# Patient Record
Sex: Female | Born: 1945 | Race: Black or African American | Hispanic: No | Marital: Single | Smoking: Former smoker
Health system: Southern US, Community
[De-identification: ages and names within clinical notes are randomized; demographics above are authoritative.]

## PROBLEM LIST (undated history)

## (undated) DIAGNOSIS — E119 Type 2 diabetes mellitus without complications: Secondary | ICD-10-CM

## (undated) DIAGNOSIS — G629 Polyneuropathy, unspecified: Secondary | ICD-10-CM

## (undated) DIAGNOSIS — I472 Ventricular tachycardia, unspecified: Principal | ICD-10-CM

## (undated) DIAGNOSIS — I639 Cerebral infarction, unspecified: Secondary | ICD-10-CM

## (undated) DIAGNOSIS — K219 Gastro-esophageal reflux disease without esophagitis: Secondary | ICD-10-CM

## (undated) DIAGNOSIS — Z95818 Presence of other cardiac implants and grafts: Secondary | ICD-10-CM

## (undated) DIAGNOSIS — T7840XA Allergy, unspecified, initial encounter: Secondary | ICD-10-CM

## (undated) DIAGNOSIS — E785 Hyperlipidemia, unspecified: Secondary | ICD-10-CM

## (undated) DIAGNOSIS — I255 Ischemic cardiomyopathy: Secondary | ICD-10-CM

## (undated) DIAGNOSIS — I10 Essential (primary) hypertension: Secondary | ICD-10-CM

## (undated) DIAGNOSIS — K222 Esophageal obstruction: Secondary | ICD-10-CM

## (undated) DIAGNOSIS — I5022 Chronic systolic (congestive) heart failure: Secondary | ICD-10-CM

## (undated) DIAGNOSIS — K449 Diaphragmatic hernia without obstruction or gangrene: Secondary | ICD-10-CM

## (undated) DIAGNOSIS — K5790 Diverticulosis of intestine, part unspecified, without perforation or abscess without bleeding: Secondary | ICD-10-CM

## (undated) DIAGNOSIS — I2510 Atherosclerotic heart disease of native coronary artery without angina pectoris: Secondary | ICD-10-CM

## (undated) HISTORY — DX: Type 2 diabetes mellitus without complications: E11.9

## (undated) HISTORY — DX: Hyperlipidemia, unspecified: E78.5

## (undated) HISTORY — DX: Esophageal obstruction: K22.2

## (undated) HISTORY — DX: Diaphragmatic hernia without obstruction or gangrene: K44.9

## (undated) HISTORY — DX: Essential (primary) hypertension: I10

## (undated) HISTORY — DX: Diverticulosis of intestine, part unspecified, without perforation or abscess without bleeding: K57.90

## (undated) HISTORY — DX: Gastro-esophageal reflux disease without esophagitis: K21.9

## (undated) HISTORY — DX: Allergy, unspecified, initial encounter: T78.40XA

## (undated) HISTORY — DX: Polyneuropathy, unspecified: G62.9

---

## 2006-02-14 ENCOUNTER — Emergency Department (HOSPITAL_COMMUNITY): Admission: EM | Admit: 2006-02-14 | Discharge: 2006-02-14 | Payer: Self-pay | Admitting: Emergency Medicine

## 2007-08-17 ENCOUNTER — Ambulatory Visit: Payer: Self-pay | Admitting: Internal Medicine

## 2007-08-17 ENCOUNTER — Ambulatory Visit: Payer: Self-pay | Admitting: *Deleted

## 2007-08-28 ENCOUNTER — Ambulatory Visit: Payer: Self-pay | Admitting: Internal Medicine

## 2007-09-10 ENCOUNTER — Ambulatory Visit: Payer: Self-pay | Admitting: Internal Medicine

## 2007-09-10 LAB — CONVERTED CEMR LAB
Albumin: 4.4 g/dL (ref 3.5–5.2)
Alkaline Phosphatase: 66 units/L (ref 39–117)
CO2: 27 meq/L (ref 19–32)
Chloride: 101 meq/L (ref 96–112)
Eosinophils Absolute: 0.1 10*3/uL (ref 0.0–0.7)
Eosinophils Relative: 3 % (ref 0–5)
HCT: 38.6 % (ref 36.0–46.0)
Hemoglobin: 12.3 g/dL (ref 12.0–15.0)
LDL Cholesterol: 131 mg/dL — ABNORMAL HIGH (ref 0–99)
Lymphocytes Relative: 47 % — ABNORMAL HIGH (ref 12–46)
Lymphs Abs: 1.8 10*3/uL (ref 0.7–4.0)
MCV: 83.7 fL (ref 78.0–100.0)
Monocytes Relative: 11 % (ref 3–12)
Platelets: 330 10*3/uL (ref 150–400)
RBC: 4.61 M/uL (ref 3.87–5.11)
TSH: 1.58 microintl units/mL (ref 0.350–5.50)
Total CHOL/HDL Ratio: 5
Triglycerides: 135 mg/dL (ref <150)

## 2007-09-18 ENCOUNTER — Ambulatory Visit: Payer: Self-pay | Admitting: Internal Medicine

## 2007-11-11 ENCOUNTER — Ambulatory Visit: Payer: Self-pay | Admitting: Internal Medicine

## 2008-02-10 ENCOUNTER — Ambulatory Visit: Payer: Self-pay | Admitting: Family Medicine

## 2008-04-06 ENCOUNTER — Ambulatory Visit: Payer: Self-pay | Admitting: Internal Medicine

## 2008-04-06 ENCOUNTER — Encounter: Payer: Self-pay | Admitting: Family Medicine

## 2008-04-13 ENCOUNTER — Ambulatory Visit (HOSPITAL_COMMUNITY): Admission: RE | Admit: 2008-04-13 | Discharge: 2008-04-13 | Payer: Self-pay | Admitting: Family Medicine

## 2008-04-28 ENCOUNTER — Encounter: Payer: Self-pay | Admitting: Family Medicine

## 2008-04-28 ENCOUNTER — Ambulatory Visit: Payer: Self-pay | Admitting: Internal Medicine

## 2008-04-28 LAB — CONVERTED CEMR LAB
ALT: 31 units/L (ref 0–35)
AST: 26 units/L (ref 0–37)
Albumin: 4.6 g/dL (ref 3.5–5.2)
Alkaline Phosphatase: 58 units/L (ref 39–117)
Calcium: 9.7 mg/dL (ref 8.4–10.5)
Cholesterol: 149 mg/dL (ref 0–200)
Glucose, Bld: 137 mg/dL — ABNORMAL HIGH (ref 70–99)
HDL: 41 mg/dL (ref >39)
Potassium: 4.1 meq/L (ref 3.5–5.3)
Sodium: 141 meq/L (ref 135–145)
Total CHOL/HDL Ratio: 3.6
Total Protein: 7.8 g/dL (ref 6.0–8.3)
Triglycerides: 191 mg/dL — ABNORMAL HIGH (ref <150)

## 2009-06-15 ENCOUNTER — Ambulatory Visit: Payer: Self-pay | Admitting: Internal Medicine

## 2009-06-15 ENCOUNTER — Encounter: Payer: Self-pay | Admitting: Family Medicine

## 2009-06-15 LAB — CONVERTED CEMR LAB: Microalb, Ur: 1.35 mg/dL (ref 0.00–1.89)

## 2009-06-28 ENCOUNTER — Ambulatory Visit: Payer: Self-pay | Admitting: Internal Medicine

## 2009-06-28 ENCOUNTER — Encounter: Payer: Self-pay | Admitting: Family Medicine

## 2009-06-28 LAB — CONVERTED CEMR LAB
Albumin: 4.8 g/dL (ref 3.5–5.2)
Alkaline Phosphatase: 60 units/L (ref 39–117)
BUN: 14 mg/dL (ref 6–23)
Basophils Absolute: 0 10*3/uL (ref 0.0–0.1)
Creatinine, Ser: 0.68 mg/dL (ref 0.40–1.20)
Eosinophils Absolute: 0.1 10*3/uL (ref 0.0–0.7)
Glucose, Bld: 142 mg/dL — ABNORMAL HIGH (ref 70–99)
HCT: 39.6 % (ref 36.0–46.0)
HDL: 40 mg/dL (ref >39)
Hemoglobin: 12.6 g/dL (ref 12.0–15.0)
LDL Cholesterol: 72 mg/dL (ref 0–99)
Lymphocytes Relative: 39 % (ref 12–46)
MCHC: 31.8 g/dL (ref 30.0–36.0)
MCV: 83.5 fL (ref 78.0–100.0)
Monocytes Absolute: 0.5 10*3/uL (ref 0.1–1.0)
Neutrophils Relative %: 49 % (ref 43–77)
Sed Rate: 11 mm/hr (ref 0–22)
Total CHOL/HDL Ratio: 3.5
Total Protein: 7.7 g/dL (ref 6.0–8.3)
WBC: 5 10*3/uL (ref 4.0–10.5)

## 2009-07-03 ENCOUNTER — Ambulatory Visit: Payer: Self-pay | Admitting: Internal Medicine

## 2009-10-13 ENCOUNTER — Ambulatory Visit: Payer: Self-pay | Admitting: Internal Medicine

## 2009-10-23 ENCOUNTER — Ambulatory Visit: Payer: Self-pay | Admitting: Internal Medicine

## 2009-10-23 LAB — CONVERTED CEMR LAB
BUN: 13 mg/dL (ref 6–23)
Chloride: 102 meq/L (ref 96–112)
Cholesterol: 155 mg/dL (ref 0–200)
Glucose, Bld: 165 mg/dL — ABNORMAL HIGH (ref 70–99)
Hgb A1c MFr Bld: 8.4 % — ABNORMAL HIGH (ref 4.6–6.1)
LDL Cholesterol: 86 mg/dL (ref 0–99)
Potassium: 4 meq/L (ref 3.5–5.3)
Sodium: 141 meq/L (ref 135–145)
Total CHOL/HDL Ratio: 3.8
VLDL: 28 mg/dL (ref 0–40)

## 2009-11-03 ENCOUNTER — Ambulatory Visit: Payer: Self-pay | Admitting: Internal Medicine

## 2010-01-23 ENCOUNTER — Ambulatory Visit: Payer: Self-pay | Admitting: Internal Medicine

## 2010-01-23 LAB — CONVERTED CEMR LAB
BUN: 14 mg/dL (ref 6–23)
Calcium: 9.7 mg/dL (ref 8.4–10.5)
Chloride: 99 meq/L (ref 96–112)
Creatinine, Ser: 0.65 mg/dL (ref 0.40–1.20)
Glucose, Bld: 171 mg/dL — ABNORMAL HIGH (ref 70–99)
HDL: 39 mg/dL — ABNORMAL LOW (ref >39)
Potassium: 4.6 meq/L (ref 3.5–5.3)
Sodium: 140 meq/L (ref 135–145)
Triglycerides: 171 mg/dL — ABNORMAL HIGH (ref <150)
VLDL: 34 mg/dL (ref 0–40)

## 2010-02-02 ENCOUNTER — Ambulatory Visit: Payer: Self-pay | Admitting: Internal Medicine

## 2010-02-02 LAB — CONVERTED CEMR LAB
CO2: 22 meq/L (ref 19–32)
Calcium: 9.4 mg/dL (ref 8.4–10.5)
Chloride: 101 meq/L (ref 96–112)
Creatinine, Ser: 0.63 mg/dL (ref 0.40–1.20)
Potassium: 4.2 meq/L (ref 3.5–5.3)
Sodium: 140 meq/L (ref 135–145)

## 2010-02-13 ENCOUNTER — Ambulatory Visit: Payer: Self-pay | Admitting: Internal Medicine

## 2010-02-21 ENCOUNTER — Ambulatory Visit: Payer: Self-pay | Admitting: Internal Medicine

## 2010-03-01 ENCOUNTER — Ambulatory Visit (HOSPITAL_COMMUNITY): Admission: RE | Admit: 2010-03-01 | Discharge: 2010-03-01 | Payer: Self-pay | Admitting: Family Medicine

## 2010-03-08 ENCOUNTER — Ambulatory Visit: Payer: Self-pay | Admitting: Internal Medicine

## 2010-03-15 ENCOUNTER — Ambulatory Visit: Payer: Self-pay | Admitting: Internal Medicine

## 2010-05-10 ENCOUNTER — Encounter (INDEPENDENT_AMBULATORY_CARE_PROVIDER_SITE_OTHER): Payer: Self-pay | Admitting: Internal Medicine

## 2010-05-10 LAB — CONVERTED CEMR LAB
BUN: 14 mg/dL (ref 6–23)
Chloride: 103 meq/L (ref 96–112)
Potassium: 4.5 meq/L (ref 3.5–5.3)

## 2010-06-18 ENCOUNTER — Encounter (INDEPENDENT_AMBULATORY_CARE_PROVIDER_SITE_OTHER): Payer: Self-pay | Admitting: *Deleted

## 2010-06-18 LAB — CONVERTED CEMR LAB
Cholesterol: 179 mg/dL (ref 0–200)
VLDL: 40 mg/dL (ref 0–40)

## 2010-06-23 ENCOUNTER — Emergency Department (HOSPITAL_COMMUNITY): Admission: EM | Admit: 2010-06-23 | Discharge: 2010-06-23 | Payer: Self-pay | Admitting: Emergency Medicine

## 2011-03-25 ENCOUNTER — Other Ambulatory Visit (HOSPITAL_COMMUNITY): Payer: Self-pay | Admitting: Family Medicine

## 2011-03-25 DIAGNOSIS — Z1231 Encounter for screening mammogram for malignant neoplasm of breast: Secondary | ICD-10-CM

## 2011-04-04 ENCOUNTER — Ambulatory Visit (HOSPITAL_COMMUNITY)
Admission: RE | Admit: 2011-04-04 | Discharge: 2011-04-04 | Disposition: A | Discharge status: Home / Self Care | Payer: Medicare Other | Source: Ambulatory Visit | Attending: Family Medicine | Admitting: Family Medicine

## 2011-04-04 DIAGNOSIS — Z1231 Encounter for screening mammogram for malignant neoplasm of breast: Secondary | ICD-10-CM

## 2011-08-20 HISTORY — PX: TOOTH EXTRACTION: SUR596

## 2012-05-06 ENCOUNTER — Other Ambulatory Visit (HOSPITAL_COMMUNITY): Payer: Self-pay | Admitting: Internal Medicine

## 2012-05-06 DIAGNOSIS — Z1231 Encounter for screening mammogram for malignant neoplasm of breast: Secondary | ICD-10-CM

## 2012-05-21 ENCOUNTER — Ambulatory Visit (HOSPITAL_COMMUNITY)
Admission: RE | Admit: 2012-05-21 | Discharge: 2012-05-21 | Disposition: A | Discharge status: Home / Self Care | Payer: Medicare Other | Source: Ambulatory Visit | Attending: Internal Medicine | Admitting: Internal Medicine

## 2012-05-21 DIAGNOSIS — Z1231 Encounter for screening mammogram for malignant neoplasm of breast: Secondary | ICD-10-CM | POA: Insufficient documentation

## 2013-06-08 ENCOUNTER — Other Ambulatory Visit (HOSPITAL_COMMUNITY): Payer: Self-pay | Admitting: Internal Medicine

## 2013-06-08 DIAGNOSIS — Z1231 Encounter for screening mammogram for malignant neoplasm of breast: Secondary | ICD-10-CM

## 2013-06-23 ENCOUNTER — Ambulatory Visit (HOSPITAL_COMMUNITY)
Admission: RE | Admit: 2013-06-23 | Discharge: 2013-06-23 | Disposition: A | Discharge status: Home / Self Care | Payer: Medicare Other | Source: Ambulatory Visit | Attending: Internal Medicine | Admitting: Internal Medicine

## 2013-06-23 DIAGNOSIS — Z1231 Encounter for screening mammogram for malignant neoplasm of breast: Secondary | ICD-10-CM | POA: Insufficient documentation

## 2013-12-28 ENCOUNTER — Encounter: Payer: Self-pay | Admitting: Internal Medicine

## 2014-02-15 ENCOUNTER — Encounter: Payer: Self-pay | Admitting: Internal Medicine

## 2014-03-01 ENCOUNTER — Encounter: Payer: Medicare Other | Admitting: Internal Medicine

## 2014-04-22 ENCOUNTER — Ambulatory Visit (AMBULATORY_SURGERY_CENTER): Payer: Self-pay | Admitting: *Deleted

## 2014-04-22 ENCOUNTER — Telehealth: Payer: Self-pay | Admitting: *Deleted

## 2014-04-22 VITALS — Ht 65.0 in | Wt 186.6 lb

## 2014-04-22 DIAGNOSIS — Z1211 Encounter for screening for malignant neoplasm of colon: Secondary | ICD-10-CM

## 2014-04-22 MED ORDER — MOVIPREP 100 G PO SOLR: ORAL | Status: DC

## 2014-04-22 NOTE — Progress Notes (Signed)
No allergies to eggs or soy. No problems with anesthesia.  Pt given Emmi instructions for colonoscopy  No oxygen use  No diet drug use  

## 2014-04-22 NOTE — Telephone Encounter (Signed)
Dr Hilarie Fredrickson: pt is scheduled for screening colonoscopy 05/06/14.  During PV pt says Dr Jeanie Cooks wants her to be evaluated for reflux symptoms.  Pt has been having feeling of fullness in esophagus and belching after eating.  She says symptoms are worse when she lays down to sleep at night.  No dysphagia.  She has tried Zegerid but it was not woking.  Is taking Alka Seltzer antacid and this works sometimes.  She would like to keep colonoscopy appt. as scheduled and make OV to evaluate reflux symptoms.  Is this okay with you?  Thanks, Juliann Pulse

## 2014-04-26 NOTE — Telephone Encounter (Signed)
Would have her seen so that EGD can be performed on the same day if deemed appropriate Can be seen by APP or me (colonoscopy may need to be delayed depending on schedule)

## 2014-04-26 NOTE — Telephone Encounter (Signed)
Spoke with pt today and explained that Dr Hilarie Fredrickson recommends an office visit with him or an extender prior to scheduling a double procedure to make sure this is necessary. Pt verbalized understanding of this and appointment given for 9-17 at 0830 am with Alonza Bogus, colon for 9-18 cancelled and will be rescheduled after this office visit. Pt verbalized understanding of having to cancel and then reschedule after her office visit.  Pt verbalized OV date and time today. marie Million Maharaj rn

## 2014-05-05 ENCOUNTER — Encounter: Payer: Self-pay | Admitting: Gastroenterology

## 2014-05-05 ENCOUNTER — Ambulatory Visit (INDEPENDENT_AMBULATORY_CARE_PROVIDER_SITE_OTHER): Payer: Medicare Other | Admitting: Gastroenterology

## 2014-05-05 VITALS — BP 120/76 | HR 80 | Ht 65.0 in | Wt 187.6 lb

## 2014-05-05 DIAGNOSIS — R1319 Other dysphagia: Secondary | ICD-10-CM

## 2014-05-05 DIAGNOSIS — K219 Gastro-esophageal reflux disease without esophagitis: Secondary | ICD-10-CM | POA: Insufficient documentation

## 2014-05-05 DIAGNOSIS — Z800 Family history of malignant neoplasm of digestive organs: Secondary | ICD-10-CM

## 2014-05-05 NOTE — Patient Instructions (Signed)
You have been scheduled for a colonoscopy/endoscopy with Dr. Hilarie Fredrickson. Please follow written instructions given to you at your visit today. Please pick up your prep kit at the pharmacy within the next 1-3 days. If you use inhalers (even only as needed), please bring them with you on the day of your procedure. Your physician has requested that you go to www.startemmi.com and enter the access code given to you at your visit today. This web site gives a general overview about your procedure. However, you should still follow specific instructions given to you by our office regarding your preparation for the procedure.

## 2014-05-05 NOTE — Progress Notes (Addendum)
05/05/2014 Paula Wong [id number] 1946-06-29   HISTORY OF PRESENT ILLNESS:  This is a pleasant 68 year old female who presents to our office today to schedule colonoscopy and discuss some issues with dysphagia.  She has never undergone colonoscopy in the past.  Her mother apparently died of colon cancer approximately 10 years ago, but the patient did not know what she died of until recently.  Patient denies any bowel complaints.  She does complain of some dysphagia, however.  She says that for the past 5 or 6 years when she eats solid food it tends to get stuck in her esophagus.  Once it gets stuck it takes a while to go down.  Drinking liquids does not help it to go down.  She does not have a problem with drinking liquids alone.  She denies weight loss or abdominal pain.  No complaints of heartburn/refulx.  She will take OTC Zegerid or Alka-Seltzer chew tabs prn to try and help the food go down.   Past Medical History  Diagnosis Date  . Diabetes   . GERD (gastroesophageal reflux disease)   . Neuropathy     feet  . Hypertension   . Hyperlipidemia   . Allergy    Past Surgical History  Procedure Laterality Date  . Tooth extraction  2013    reports that she quit smoking about 10 years ago. She has never used smokeless tobacco. She reports that she does not drink alcohol or use illicit drugs. family history includes Colon cancer (age of onset: 23) in her mother. No Known Allergies    Outpatient Encounter Prescriptions as of 05/05/2014  Medication Sig  . amLODipine (NORVASC) 10 MG tablet Take 10 mg by mouth daily.  . Calcium Carbonate Antacid (ALKA-SELTZER ANTACID PO) Take by mouth as needed.  . gabapentin (NEURONTIN) 100 MG capsule Take 100 mg by mouth 2 (two) times daily.  Marland Kitchen glipiZIDE (GLUCOTROL) 10 MG tablet Take 10 mg by mouth 2 (two) times daily before a meal.  . Insulin Glargine (LANTUS SOLOSTAR [place]) Inject 20 Units into the skin daily.  . Linagliptin (TRADJENTA PO) Take by  mouth daily.  Marland Kitchen lisinopril (PRINIVIL,ZESTRIL) 40 MG tablet Take 40 mg by mouth daily.  Marland Kitchen loratadine (CLARITIN) 10 MG tablet Take 10 mg by mouth daily.  . magnesium oxide (MAG-OX) 400 MG tablet Take 400 mg by mouth daily.  . meloxicam (MOBIC) 15 MG tablet Take 15 mg by mouth daily.  . metFORMIN (GLUCOPHAGE) 500 MG tablet Take by mouth 2 (two) times daily with a meal.  . metoprolol succinate (TOPROL-XL) 50 MG 24 hr tablet Take 50 mg by mouth 2 (two) times daily. Take with or immediately following a meal.  . MOVIPREP 100 G SOLR moviprep as directed. No substitutions  . Multiple Vitamin (MULTIVITAMIN) tablet Take 1 tablet by mouth daily.  Earney Navy Bicarbonate (ZEGERID PO) Take by mouth as needed.  . pravastatin (PRAVACHOL) 20 MG tablet Take 20 mg by mouth daily.     REVIEW OF SYSTEMS  : All other systems reviewed and negative except where noted in the History of Present Illness.   PHYSICAL EXAM: BP 120/76  Pulse 80  Ht 5\' 5"  (1.651 m)  Wt 187 lb 9.6 oz (85.095 kg)  BMI 31.22 kg/m2 General: Well developed black female in no acute distress Head: Normocephalic and atraumatic Eyes:  Sclerae anicteric, conjunctiva pink. Ears: Normal auditory acuity Lungs: Clear throughout to auscultation Heart: Regular rate and rhythm Abdomen: Soft, non-distended.  Normal bowel sounds.  Non-tender.  Non-tender, reducible umbilical hernia noted. Rectal:  Deferred.  Will be done at the time of colonoscopy. Musculoskeletal: Symmetrical with no gross deformities  Skin: No lesions on visible extremities Extremities: No edema  Neurological: Alert oriented x 4, grossly non-focal Psychological:  Alert and cooperative. Normal mood and affect  ASSESSMENT AND PLAN: -Family history of colon cancer in her mother:  Patient never had a colonoscopy in the past.  Will schedule for colonoscopy. -Dysphagia to solid food:  Rule out stricture vs Schatzki's ring, etc.  Will schedule EGD with possible dilation as  well.    The risks, benefits, and alternatives were discussed with the patient and she consents to proceed.    Addendum: Reviewed and agree with initial management. Jerene Bears, MD

## 2014-05-06 ENCOUNTER — Encounter: Payer: Medicare Other | Admitting: Internal Medicine

## 2014-06-15 ENCOUNTER — Other Ambulatory Visit (HOSPITAL_COMMUNITY): Payer: Self-pay | Admitting: Internal Medicine

## 2014-06-15 DIAGNOSIS — E2839 Other primary ovarian failure: Secondary | ICD-10-CM

## 2014-06-17 ENCOUNTER — Encounter: Payer: Self-pay | Admitting: Internal Medicine

## 2014-06-17 ENCOUNTER — Ambulatory Visit (AMBULATORY_SURGERY_CENTER): Payer: Medicare Other | Admitting: Internal Medicine

## 2014-06-17 VITALS — BP 148/88 | HR 75 | Temp 97.4°F | Resp 16 | Ht 65.0 in | Wt 186.0 lb

## 2014-06-17 DIAGNOSIS — R1319 Other dysphagia: Secondary | ICD-10-CM

## 2014-06-17 DIAGNOSIS — Z800 Family history of malignant neoplasm of digestive organs: Secondary | ICD-10-CM

## 2014-06-17 DIAGNOSIS — K219 Gastro-esophageal reflux disease without esophagitis: Secondary | ICD-10-CM

## 2014-06-17 DIAGNOSIS — K2100 Gastro-esophageal reflux disease with esophagitis, without bleeding: Secondary | ICD-10-CM

## 2014-06-17 DIAGNOSIS — R1314 Dysphagia, pharyngoesophageal phase: Secondary | ICD-10-CM

## 2014-06-17 DIAGNOSIS — D128 Benign neoplasm of rectum: Secondary | ICD-10-CM

## 2014-06-17 DIAGNOSIS — D122 Benign neoplasm of ascending colon: Secondary | ICD-10-CM

## 2014-06-17 DIAGNOSIS — K635 Polyp of colon: Secondary | ICD-10-CM

## 2014-06-17 DIAGNOSIS — R12 Heartburn: Secondary | ICD-10-CM

## 2014-06-17 DIAGNOSIS — Z1211 Encounter for screening for malignant neoplasm of colon: Secondary | ICD-10-CM

## 2014-06-17 DIAGNOSIS — R1310 Dysphagia, unspecified: Secondary | ICD-10-CM

## 2014-06-17 MED ORDER — PANTOPRAZOLE SODIUM 40 MG PO TBEC: DELAYED_RELEASE_TABLET | ORAL | Status: DC

## 2014-06-17 MED ORDER — SODIUM CHLORIDE 0.9 % IV SOLN
500.0000 mL | INTRAVENOUS | Status: DC
Start: 2014-06-17 — End: 2014-06-17

## 2014-06-17 NOTE — Patient Instructions (Signed)
YOU HAD AN ENDOSCOPIC PROCEDURE TODAY AT THE [facility]: Refer to the procedure report that was given to you for any specific questions about what was found during the examination.  If the procedure report does not answer your questions, please call your gastroenterologist to clarify.  If you requested that your care partner not be given the details of your procedure findings, then the procedure report has been included in a sealed envelope for you to review at your convenience later.  YOU SHOULD EXPECT: Some feelings of bloating in the abdomen. Passage of more gas than usual.  Walking can help get rid of the air that was put into your GI tract during the procedure and reduce the bloating. If you had a lower endoscopy (such as a colonoscopy or flexible sigmoidoscopy) you may notice spotting of blood in your stool or on the toilet paper. If you underwent a bowel prep for your procedure, then you may not have a normal bowel movement for a few days.  DIET: Your first meal following the procedure should be a light meal and then it is ok to progress to your normal diet.  A half-sandwich or bowl of soup is an example of a good first meal.  Heavy or fried foods are harder to digest and may make you feel nauseous or bloated.  Likewise meals heavy in dairy and vegetables can cause extra gas to form and this can also increase the bloating.  Drink plenty of fluids but you should avoid alcoholic beverages for 24 hours.  ACTIVITY: Your care partner should take you home directly after the procedure.  You should plan to take it easy, moving slowly for the rest of the day.  You can resume normal activity the day after the procedure however you should NOT DRIVE or use heavy machinery for 24 hours (because of the sedation medicines used during the test).    SYMPTOMS TO REPORT IMMEDIATELY: A gastroenterologist can be reached at any hour.  During normal business hours, 8:30 AM to 5:00 PM Monday through Friday,  call [phone].  After hours and on weekends, please call the GI answering service at [phone] who will take a message and have the physician on call contact you.   Following lower endoscopy (colonoscopy or flexible sigmoidoscopy):  Excessive amounts of blood in the stool  Significant tenderness or worsening of abdominal pains  Swelling of the abdomen that is new, acute  Fever of 100F or higher  Following upper endoscopy (EGD)  Vomiting of blood or coffee ground material  New chest pain or pain under the shoulder blades  Painful or persistently difficult swallowing  New shortness of breath  Fever of 100F or higher  Black, tarry-looking stools  FOLLOW UP: If any biopsies were taken you will be contacted by phone or by letter within the next 1-3 weeks.  Call your gastroenterologist if you have not heard about the biopsies in 3 weeks.  Our staff will call the home number listed on your records the next business day following your procedure to check on you and address any questions or concerns that you may have at that time regarding the information given to you following your procedure. This is a courtesy call and so if there is no answer at the home number and we have not heard from you through the emergency physician on call, we will assume that you have returned to your regular daily activities without incident.  SIGNATURES/CONFIDENTIALITY: You and/or your care   partner have signed paperwork which will be entered into your electronic medical record.  These signatures attest to the fact that that the information above on your After Visit Summary has been reviewed and is understood.  Full responsibility of the confidentiality of this discharge information lies with you and/or your care-partner.   PANTOPRAZOLE 40MG  TWICE DAILY FOR 4 WEEKS AND THEN ONCE DAILY , TAKE 30 MINUTES BEFORE 1ST MEAL OF THE DAY   OFFICE FOLLOW UP IN 8 WEEKS   INFORMATION ON POLYPS AND DIVERTICULOSIS   GIVEN TO YOU TODAY   AWAIT BIOPSY RESULTS FROM ESOPHAGEAL BIOPSIES AND FROM COLON POLYPS REMOVED

## 2014-06-17 NOTE — Progress Notes (Signed)
Patient awakening,vss,report to rn 

## 2014-06-17 NOTE — Op Note (Signed)
[facility]  [address] [place], [id number]   COLONOSCOPY PROCEDURE REPORT  PATIENT: Paula, Wong  MR#: [id number] BIRTHDATE: [birth year] , 28  yrs. old GENDER: female ENDOSCOPIST: Jerene Bears, MD REFERRED YE:MVVKP Jeanie Cooks, M.D. PROCEDURE DATE:  06/17/2014 PROCEDURE:   Colonoscopy with cold biopsy polypectomy First Screening Colonoscopy - Avg.  risk and is 50 yrs.  old or older - No.  Prior Negative Screening - Now for repeat screening. N/A  History of Adenoma - Now for follow-up colonoscopy & has been > or = to 3 yrs.  N/A  Polyps Removed Today? Yes. ASA CLASS:   Class III INDICATIONS:patient's immediate family history of colon cancer and first colonoscopy. MEDICATIONS: Monitored anesthesia care, Propofol 150 mg IV, and Residual sedation present  DESCRIPTION OF PROCEDURE:   After the risks benefits and alternatives of the procedure were thoroughly explained, informed consent was obtained.  The digital rectal exam revealed no rectal mass.   The LB [id number]  endoscope was introduced through the anus and advanced to the cecum, which was identified by both the appendix and ileocecal valve. No adverse events experienced. The quality of the prep was good, using MoviPrep  The instrument was then slowly withdrawn as the colon was fully examined.   COLON FINDINGS: A sessile polyp measuring 3 mm in size was found in the ascending colon.  A polypectomy was performed with cold forceps.  The resection was complete, the polyp tissue was completely retrieved and sent to histology.   There was moderate diverticulosis noted in the ascending colon and sigmoid colon. Retroflexed views revealed scarring in the distal rectum consistent with prior surgical intervention, query hemorrhoidectomy. The time to cecum=1 minutes 36 seconds.  Withdrawal time=10 minutes 30 seconds.  The scope was withdrawn and the procedure completed.  COMPLICATIONS: There were no immediate  complications.  ENDOSCOPIC IMPRESSION: 1.   Sessile polyp was found in the ascending colon; polypectomy was performed with cold forceps 2.   Moderate diverticulosis was noted in the ascending colon and sigmoid colon  RECOMMENDATIONS: 1.  Await pathology results 2.  Repeat Colonoscopy in 5 years. 3.  You will receive a letter within 1-2 weeks with the results of your biopsy as well as final recommendations.  Please call my office if you have not received a letter after 3 weeks.  eSigned:  Jerene Bears, MD 06/17/2014 4:08 PM   cc: The Patient, PCP

## 2014-06-17 NOTE — Op Note (Signed)
[facility]  [address] [place], [address]   ENDOSCOPY PROCEDURE REPORT  PATIENT: Paula, Wong  MR#: [id number] BIRTHDATE: [birth year] , 89  yrs. old GENDER: female ENDOSCOPIST: Jerene Bears, MD REFERRED BY:  Nolene Ebbs, M.D. PROCEDURE DATE:  06/17/2014 PROCEDURE:  EGD w/ biopsy ASA CLASS:     Class III INDICATIONS:  heartburn and dysphagia. MEDICATIONS: Monitored anesthesia care and Propofol 200 mg IV TOPICAL ANESTHETIC: none  DESCRIPTION OF PROCEDURE: After the risks benefits and alternatives of the procedure were thoroughly explained, informed consent was obtained.  The LB [id number] endoscope was introduced through the mouth and advanced to the second portion of the duodenum , Without limitations.  The instrument was slowly withdrawn as the mucosa was fully examined.   ESOPHAGUS: A non-obstructing Schatzki ring was found at the gastroesophageal junction with associated moderate reflux esophagitis (there was friability with contact oozing).  Multiple biopsies were performed using cold forceps.  STOMACH: A 2 cm hiatal hernia was noted.   The mucosa of the stomach appeared normal.  DUODENUM: The duodenal mucosa showed no abnormalities in the bulb and 2nd part of the duodenum.  Retroflexed views revealed a hiatal hernia.     The scope was then withdrawn from the patient and the procedure completed.  COMPLICATIONS: There were no immediate complications.  ENDOSCOPIC IMPRESSION: 1.   Schatzki ring with esophagitis was found at the gastroesophageal junction; multiple biopsies were performed 2.   2 cm hiatal hernia 3.   The mucosa of the stomach appeared normal 4.   The duodenal mucosa showed no abnormalities in the bulb and 2nd part of the duodenum  RECOMMENDATIONS: 1.  Begin pantoprazole 40 mg twice daily for 4 weeks, then change to once daily 30 minutes before breakfast each day (for esophagitis) 2.  Office follow-up in about 8  weeks  eSigned:  Jerene Bears, MD 06/17/2014 4:04 PM   CC: the Patient, PCP

## 2014-06-17 NOTE — Progress Notes (Signed)
Called to room to assist during endoscopic procedure.  Patient ID and intended procedure confirmed with present staff. Received instructions for my participation in the procedure from the performing physician.  

## 2014-06-20 ENCOUNTER — Telehealth: Payer: Self-pay | Admitting: *Deleted

## 2014-06-20 ENCOUNTER — Encounter: Payer: Self-pay | Admitting: *Deleted

## 2014-06-20 NOTE — Telephone Encounter (Signed)
  Follow up Call-  Call back number 06/17/2014  Post procedure Call Back phone  # [phone]  Permission to leave phone message Yes     Patient questions:  Do you have a fever, pain , or abdominal swelling? No. Pain Score  0 *  Have you tolerated food without any problems? Yes.    Have you been able to return to your normal activities? Yes.    Do you have any questions about your discharge instructions: Diet   No. Medications  No. Follow up visit  No.  Do you have questions or concerns about your Care? No.  Actions: * If pain score is 4 or above: No action needed, pain <4.

## 2014-06-21 ENCOUNTER — Ambulatory Visit (HOSPITAL_COMMUNITY)
Admission: RE | Admit: 2014-06-21 | Discharge: 2014-06-21 | Disposition: A | Discharge status: Home / Self Care | Payer: Medicare Other | Source: Ambulatory Visit | Attending: Internal Medicine | Admitting: Internal Medicine

## 2014-06-21 DIAGNOSIS — E2839 Other primary ovarian failure: Secondary | ICD-10-CM

## 2014-06-21 DIAGNOSIS — Z13820 Encounter for screening for osteoporosis: Secondary | ICD-10-CM | POA: Insufficient documentation

## 2014-06-21 DIAGNOSIS — Z780 Asymptomatic menopausal state: Secondary | ICD-10-CM | POA: Insufficient documentation

## 2014-06-22 ENCOUNTER — Other Ambulatory Visit (HOSPITAL_COMMUNITY): Payer: Self-pay | Admitting: Internal Medicine

## 2014-06-22 DIAGNOSIS — Z1231 Encounter for screening mammogram for malignant neoplasm of breast: Secondary | ICD-10-CM

## 2014-06-23 ENCOUNTER — Encounter: Payer: Self-pay | Admitting: Internal Medicine

## 2014-06-30 ENCOUNTER — Ambulatory Visit (HOSPITAL_COMMUNITY)
Admission: RE | Admit: 2014-06-30 | Discharge: 2014-06-30 | Disposition: A | Discharge status: Home / Self Care | Payer: Medicare Other | Source: Ambulatory Visit | Attending: Internal Medicine | Admitting: Internal Medicine

## 2014-06-30 DIAGNOSIS — Z1231 Encounter for screening mammogram for malignant neoplasm of breast: Secondary | ICD-10-CM

## 2014-08-23 ENCOUNTER — Encounter: Payer: Self-pay | Admitting: Internal Medicine

## 2014-08-23 ENCOUNTER — Ambulatory Visit (INDEPENDENT_AMBULATORY_CARE_PROVIDER_SITE_OTHER): Payer: Medicare Other | Admitting: Internal Medicine

## 2014-08-23 VITALS — BP 138/80 | HR 92 | Ht 64.0 in | Wt 185.1 lb

## 2014-08-23 DIAGNOSIS — Z800 Family history of malignant neoplasm of digestive organs: Secondary | ICD-10-CM

## 2014-08-23 DIAGNOSIS — K2100 Gastro-esophageal reflux disease with esophagitis, without bleeding: Secondary | ICD-10-CM

## 2014-08-23 MED ORDER — PANTOPRAZOLE SODIUM 40 MG PO TBEC: 40.0000 mg | DELAYED_RELEASE_TABLET | Freq: Every day | ORAL | Status: DC

## 2014-08-23 NOTE — Patient Instructions (Signed)
We have sent the following medications to your pharmacy for you to pick up at your convenience:Protonix.  Please follow up with Dr. Hilarie Fredrickson in 6 months.

## 2014-08-23 NOTE — Progress Notes (Signed)
   Subjective:    Patient ID: Paula Wong, female    DOB: November 29, 1945, 69 y.o.   MRN: [id number]  HPI Paula Wong is a 70 yo female with PMH of GERD with reflux esophagitis, nonobstructing Schatzki's ring, family history of colon cancer, diabetes, hypertension, hyperlipidemia who is seen for follow-up. She was initially seen by Alonza Bogus, PA-C on 05/05/2014. At that time she was having issues with dysphagia, primarily solid. She had an upper endoscopy and colonoscopy performed on 06/17/2014. Upper endoscopy revealed a nonobstructing Schatzki's ring with moderate reflux esophagitis. Biopsies were performed. 2 cm hiatal hernia with normal gastric mucosa. Normal examined duodenum. She was started on pantoprazole 40 mg twice daily for 4 weeks and then advised to decrease dose to 40 mg once weekly. Pathology results revealed gastroesophageal junction mucosa with mild inflammation consistent with GERD, no evidence of Barrett's esophagus or dysplasia. Colonoscopy to the cecum revealed a 3 mm ascending colon polyp removed with cold forceps and moderate diverticulosis in the ascending and sigmoid colon. The polyp was polypoid colorectal mucosa without dysplasia.  She reports that the pantoprazole was like a miracle medicine for her. No further issues with heartburn or dysphagia. No odynophagia. She did use a medication for 1 month twice daily and is now using 40 mg once daily. No abdominal pain. No nausea or vomiting. Normal bowel movements. No blood in her stool or melena.  Review of Systems As per history of present illness, otherwise negative  Current Medications, Allergies, Past Medical History, Past Surgical History, Family History and Social History were reviewed in [employer] record.     Objective:   Physical Exam BP 138/80 mmHg  Pulse 92  Ht 5\' 4"  (1.626 m)  Wt 185 lb 2 oz (83.972 kg)  BMI 31.76 kg/m2 Constitutional: Well-developed and well-nourished. No  distress. HEENT: Normocephalic and atraumatic. Oropharynx is clear and moist. No oropharyngeal exudate. Conjunctivae are normal.  No scleral icterus. Neck: Neck supple. Trachea midline. Cardiovascular: Normal rate, regular rhythm and intact distal pulses. No M/R/G Pulmonary/chest: Effort normal and breath sounds normal. No wheezing, rales or rhonchi. Abdominal: Soft, nontender, nondistended. Bowel sounds active throughout.  Extremities: no clubbing, cyanosis, or edema Neurological: Alert and oriented to person place and time. Skin: Skin is warm and dry. No rashes noted. Psychiatric: Normal mood and affect. Behavior is normal.  EGD/colonoscopy reviewed including with the patient    Assessment & Plan:  69 yo female with PMH of GERD with reflux esophagitis, nonobstructing Schatzki's ring, family history of colon cancer, diabetes, hypertension, hyperlipidemia who is seen for follow-up.   1. GERD with reflux esophagitis -- symptoms have resolved including dysphagia after initiation of PPI therapy. She's continued pantoprazole 40 mg once daily and we will continue with this dose for now. Biopsies were negative for Barrett's esophagus or dysplasia. Schatzki's ring was nonobstructing and dysphagia symptoms have resolved with PPI alone. We discussed how dilation could be performed in the future if necessary for dysphagia. GERD diet recommended. She will follow-up in 6 months. We will consider discontinuation of PPI or lowering the dose to 20 mg daily.  2. Family history of colon cancer, mother -- colonoscopy recently without adenoma. Repeat in 5 years per guidelines.

## 2015-02-15 ENCOUNTER — Telehealth: Payer: Self-pay | Admitting: Internal Medicine

## 2015-02-15 NOTE — Telephone Encounter (Signed)
Attempted to call pt, no answer. Will attempt to reach pt again.

## 2015-02-15 NOTE — Telephone Encounter (Signed)
Spoke with pt and per OV note pt was to continue taking pantoprazole until OV. Pt aware.

## 2015-03-24 ENCOUNTER — Encounter: Payer: Self-pay | Admitting: *Deleted

## 2015-04-13 ENCOUNTER — Encounter: Payer: Self-pay | Admitting: Internal Medicine

## 2015-04-13 ENCOUNTER — Ambulatory Visit (INDEPENDENT_AMBULATORY_CARE_PROVIDER_SITE_OTHER): Payer: Medicare Other | Admitting: Internal Medicine

## 2015-04-13 VITALS — BP 116/72 | HR 76 | Ht 64.0 in | Wt 187.4 lb

## 2015-04-13 DIAGNOSIS — K219 Gastro-esophageal reflux disease without esophagitis: Secondary | ICD-10-CM | POA: Diagnosis not present

## 2015-04-13 DIAGNOSIS — Z800 Family history of malignant neoplasm of digestive organs: Secondary | ICD-10-CM

## 2015-04-13 MED ORDER — PANTOPRAZOLE SODIUM 20 MG PO TBEC: 20.0000 mg | DELAYED_RELEASE_TABLET | Freq: Every day | ORAL | Status: DC

## 2015-04-13 NOTE — Progress Notes (Signed)
   Subjective:    Patient ID: Paula Wong, female    DOB: 01/16/1946, 69 y.o.   MRN: [id number]  HPI Paula Wong is a 69 year old female with history of GERD with reflux esophagitis, nonobstructing Schatzki's ring, family history of colon cancer who is seen in follow-up. She was last seen in January 2016. She had an upper endoscopy showing moderate reflux esophagitis in October 2015. She was started on pantoprazole initially twice a day and then down to once daily at 40 mg. She returns for follow-up. She reports she is feeling great. Her reflux, heartburn and dysphagia have resolved entirely. No swallowing trouble. No nausea or vomiting. Good appetite. Bowel movements regular without blood in her stool or melena. She is extremely happy with the way she feels currently  Review of Systems As per history of present illness, otherwise negative  Current Medications, Allergies, Past Medical History, Past Surgical History, Family History and Social History were reviewed in [employer] record.     Objective:   Physical Exam BP 116/72 mmHg  Pulse 76  Ht 5\' 4"  (1.626 m)  Wt 187 lb 6.4 oz (85.004 kg)  BMI 32.15 kg/m2 Constitutional: Well-developed and well-nourished. No distress. HEENT: Normocephalic and atraumatic.  Conjunctivae are normal.  No scleral icterus. Neck: Neck supple. Trachea midline. Cardiovascular: Normal rate, regular rhythm and intact distal pulses. No M/R/G Pulmonary/chest: Effort normal and breath sounds normal. No wheezing, rales or rhonchi. Abdominal: Soft, nontender, nondistended. Bowel sounds active throughout. Extremities: no clubbing, cyanosis, or edema Lymphadenopathy: No cervical adenopathy noted. Neurological: Alert and oriented to person place and time. Skin: Skin is warm and dry. No rashes noted. Psychiatric: Normal mood and affect. Behavior is normal.     Assessment & Plan:  69 year old female with history of GERD with reflux  esophagitis, nonobstructing Schatzki's ring, family history of colon cancer who is seen in follow-up.  1. GERD with history of esophagitis -- resolution of GERD and esophagitis symptoms on pantoprazole 40 mg. We discussed long-term PPI and I will try to taper her dose to 20 mg daily. I asked that she notify me should she develop recurrent heartburn or any trouble swallowing with the dose reduction. Given her Schatzki's ring and moderate esophagitis she will likely require some element of acid suppression on an ongoing basis. We discussed this in detail. GERD diet again recommended and advised. I will have her follow-up in one year, sooner if necessary  2. Family history of colon cancer in mother -- repeat colonoscopy recommended in October 2020

## 2015-04-13 NOTE — Patient Instructions (Signed)
We have sent the following medications to your pharmacy for you to pick up at your convenience: Protonix (pantaprazole) 20 mg daily.  Please follow-up in one year or earlier if needed.

## 2015-04-28 ENCOUNTER — Telehealth: Payer: Self-pay | Admitting: Internal Medicine

## 2015-04-28 NOTE — Telephone Encounter (Signed)
Pt states that Dr. Hilarie Fredrickson decreased her protonix to 20mg  daily. States she started this on Monday and her bm's started being very loose yesterday. Pt thinks the dose change has caused this. Please advise.

## 2015-04-28 NOTE — Telephone Encounter (Signed)
Unlikely decrease in dose would lead to looser stools.  Would continue or 1 week and see if this changes If not, could go back to 40 mg daily for 1 week and monitor any change in BM

## 2015-04-28 NOTE — Telephone Encounter (Signed)
Pt aware.

## 2016-01-23 ENCOUNTER — Other Ambulatory Visit: Payer: Self-pay | Admitting: Internal Medicine

## 2016-01-23 DIAGNOSIS — Z1231 Encounter for screening mammogram for malignant neoplasm of breast: Secondary | ICD-10-CM

## 2016-02-02 ENCOUNTER — Ambulatory Visit
Admission: RE | Admit: 2016-02-02 | Discharge: 2016-02-02 | Disposition: A | Discharge status: Home / Self Care | Payer: Medicaid Other | Source: Ambulatory Visit | Attending: Internal Medicine | Admitting: Internal Medicine

## 2016-02-02 DIAGNOSIS — Z1231 Encounter for screening mammogram for malignant neoplasm of breast: Secondary | ICD-10-CM

## 2016-04-16 ENCOUNTER — Other Ambulatory Visit: Payer: Self-pay | Admitting: Internal Medicine

## 2016-04-17 ENCOUNTER — Telehealth: Payer: Self-pay | Admitting: Internal Medicine

## 2016-04-17 DIAGNOSIS — K2100 Gastro-esophageal reflux disease with esophagitis, without bleeding: Secondary | ICD-10-CM

## 2016-04-17 MED ORDER — PANTOPRAZOLE SODIUM 40 MG PO TBEC
40.0000 mg | DELAYED_RELEASE_TABLET | Freq: Every day | ORAL | 0 refills | Status: DC
Start: 2016-04-17 — End: 2016-05-08

## 2016-04-17 NOTE — Telephone Encounter (Signed)
Rx sent 

## 2016-05-08 ENCOUNTER — Encounter (INDEPENDENT_AMBULATORY_CARE_PROVIDER_SITE_OTHER): Payer: Self-pay

## 2016-05-08 ENCOUNTER — Encounter: Payer: Self-pay | Admitting: Internal Medicine

## 2016-05-08 ENCOUNTER — Ambulatory Visit (INDEPENDENT_AMBULATORY_CARE_PROVIDER_SITE_OTHER): Payer: Medicare HMO | Admitting: Internal Medicine

## 2016-05-08 VITALS — BP 134/84 | HR 80 | Ht 64.0 in | Wt 182.1 lb

## 2016-05-08 DIAGNOSIS — Z800 Family history of malignant neoplasm of digestive organs: Secondary | ICD-10-CM

## 2016-05-08 DIAGNOSIS — K219 Gastro-esophageal reflux disease without esophagitis: Secondary | ICD-10-CM

## 2016-05-08 MED ORDER — PANTOPRAZOLE SODIUM 20 MG PO TBEC: 20.0000 mg | DELAYED_RELEASE_TABLET | Freq: Every day | ORAL | 5 refills | Status: DC

## 2016-05-08 NOTE — Progress Notes (Signed)
   Subjective:    Patient ID: Paula Wong, female    DOB: [birth year], 70 y.o.   MRN: [id number]  HPI Paula Wong is a 70 year old female with history of GERD with reflux esophagitis, nonobstructing Schatzki's ring, family history of colon cancer who is here for follow-up. She was last seen on 04/13/2015. At that time her pantoprazole was decreased to 20 mg per day. She reports that she's been doing well and has had a great year. Her reflux, heartburn and dysphagia have resolved entirely and not returned. No nausea or vomiting. Good appetite. Reports regular bowel movements without blood in her stool or melena.  Of note she called for refill pantoprazole which was granted in August 2017 until this follow-up appointment. We reduced the pantoprazole 20 mg daily but when it was refilled last month it was changed back to 40 mg daily. She reports doing fine with the 20 mg dose  Review of Systems As per history of present illness, otherwise negative  Current Medications, Allergies, Past Medical History, Past Surgical History, Family History and Social History were reviewed in [facility] record.     Objective:   Physical Exam BP 134/84 (BP Location: Left Arm, Patient Position: Sitting, Cuff Size: Normal)   Pulse 80 Comment: irregular  Ht 5\' 4"  (1.626 m)   Wt 182 lb 2 oz (82.6 kg)   BMI 31.26 kg/m  Constitutional: Well-developed and well-nourished. No distress. HEENT: Normocephalic and atraumatic. Conjunctivae are normal.  No scleral icterus. Neck: Neck supple. Trachea midline. Cardiovascular: Normal rate, regular rhythm and intact distal pulses. No M/R/G Pulmonary/chest: Effort normal and breath sounds normal. No wheezing, rales or rhonchi. Abdominal: Soft, nontender, nondistended. Bowel sounds active throughout. Extremities: no clubbing, cyanosis, or edema Neurological: Alert and oriented to person place and time. Skin: Skin is warm and dry.  Psychiatric:  Normal mood and affect. Behavior is normal.      Assessment & Plan:  70 year old female with history of GERD with reflux esophagitis, nonobstructing Schatzki's ring, family history of colon cancer who is here for follow-up.  1. GERD with history of esophagitis -- doing well on pantoprazole 20 mg daily. We will change pantoprazole back to 20 mg per day as the 40 mg dose was incorrect. Given her history of esophagitis I recommended she continue pantoprazole this time. We discussed the risk benefits and alternatives and she wishes to proceed with ongoing therapy.  2. Family history of colon cancer -- repeat colonoscopy recommended in October 2020  One-year follow-up, sooner if necessary 15 minutes spent with the patient today. Greater than 50% was spent in counseling and coordination of care with the patient

## 2016-05-08 NOTE — Patient Instructions (Addendum)
If you are age 71 or older, your body mass index should be between 23-30. Your Body mass index is 31.26 kg/m. If this is out of the aforementioned range listed, please consider follow up with your Primary Care Provider.  If you are age 75 or younger, your body mass index should be between 19-25. Your Body mass index is 31.26 kg/m. If this is out of the aformentioned range listed, please consider follow up with your Primary Care Provider.   We have sent the following medications to your pharmacy for you to pick up at your convenience: Pantoprazole 20mg . Take 1 tablet daily.  Follow up as needed with Dr Hilarie Fredrickson.

## 2017-01-07 ENCOUNTER — Encounter: Payer: Self-pay | Admitting: *Deleted

## 2017-01-08 ENCOUNTER — Encounter: Payer: Self-pay | Admitting: Internal Medicine

## 2017-01-08 ENCOUNTER — Ambulatory Visit (INDEPENDENT_AMBULATORY_CARE_PROVIDER_SITE_OTHER): Payer: Medicare HMO | Admitting: Internal Medicine

## 2017-01-08 VITALS — BP 124/72 | HR 60 | Ht 64.0 in | Wt 190.0 lb

## 2017-01-08 DIAGNOSIS — K219 Gastro-esophageal reflux disease without esophagitis: Secondary | ICD-10-CM

## 2017-01-08 NOTE — Patient Instructions (Signed)
We have sent the following medications to your pharmacy for you to pick up at your convenience: Protonix 40 mg   Please follow up with Dr Hilarie Fredrickson in 1 year.  If you are age 72 or older, your body mass index should be between 23-30. Your Body mass index is 32.61 kg/m. If this is out of the aforementioned range listed, please consider follow up with your Primary Care Provider.  If you are age 15 or younger, your body mass index should be between 19-25. Your Body mass index is 32.61 kg/m. If this is out of the aformentioned range listed, please consider follow up with your Primary Care Provider.

## 2017-01-08 NOTE — Progress Notes (Signed)
   Subjective:    Patient ID: Paula Wong, female    DOB: 09/04/1945, 71 y.o.   MRN: [id number]  HPI Paula Wong is a 71 year old female with a history of GERD with reflux esophagitis, nonobstructing Schatzki's ring with hiatal hernia, family history of colon cancer who is here for follow-up. She was last seen on 05/08/2016. She reports that she has been doing very well. She has continued pantoprazole 20 mg daily. With this she's had no heartburn, dysphagia or odynophagia. Without this medication in the past she developed dysphagia. Bowel movements of been regular 1 or 2 times per day without blood in her stool or melena. She reports good appetite without nausea or vomiting.  Her last colonoscopy was in October 2015. A non-adenomatous polyp was removed from the ascending colon. She had moderate diverticulosis in the ascending and sigmoid colon.  Review of Systems As per history of present illness, otherwise negative  Current Medications, Allergies, Past Medical History, Past Surgical History, Family History and Social History were reviewed in [employer] [employer] record.     Objective:   Physical Exam BP 124/72   Pulse 60   Ht 5\' 4"  (1.626 m)   Wt 190 lb (86.2 kg)   BMI 32.61 kg/m  Constitutional: Well-developed and well-nourished. No distress. HEENT: Normocephalic and atraumatic   No scleral icterus. Neck: Neck supple. Trachea midline. Cardiovascular: Normal rate, regular rhythm and intact distal pulses. No M/R/G Pulmonary/chest: Effort normal and breath sounds normal. No wheezing, rales or rhonchi. Abdominal: Soft, nontender, nondistended. Bowel sounds active throughout.  Extremities: no clubbing, cyanosis, or edema Neurological: Alert and oriented to person place and time. Skin: Skin is warm and dry. Psychiatric: Normal mood and affect. Behavior is normal.     Assessment & Plan:  71 year old female with a history of GERD with reflux esophagitis,  nonobstructing Schatzki's ring with hiatal hernia, family history of colon cancer who is here for follow-up.  1. GERD with history of reflux esophagitis -- stable without symptoms on pantoprazole 20 mg daily. Attempt to discontinue PPI in the past led to recurrent esophagitis symptoms with dysphagia. Given this we will continue pantoprazole 20 mg once daily. We discussed the risks, benefits and alternatives with chronic PPI therapy today. She wishes to continue therapy.  2. Family history of colon cancer -- repeat colonoscopy recommended October 2020  Annual follow-up, sooner if necessary 15 minutes spent with the patient today. Greater than 50% was spent in counseling and coordination of care with the patient

## 2017-01-09 ENCOUNTER — Telehealth: Payer: Self-pay | Admitting: Internal Medicine

## 2017-01-09 DIAGNOSIS — K219 Gastro-esophageal reflux disease without esophagitis: Secondary | ICD-10-CM

## 2017-01-09 MED ORDER — PANTOPRAZOLE SODIUM 20 MG PO TBEC: 20.0000 mg | DELAYED_RELEASE_TABLET | Freq: Every day | ORAL | 5 refills | Status: DC

## 2017-01-09 NOTE — Telephone Encounter (Signed)
Rx sent 

## 2017-07-07 ENCOUNTER — Telehealth: Payer: Self-pay | Admitting: Internal Medicine

## 2017-07-07 DIAGNOSIS — K219 Gastro-esophageal reflux disease without esophagitis: Secondary | ICD-10-CM

## 2017-07-07 MED ORDER — PANTOPRAZOLE SODIUM 20 MG PO TBEC: 20.0000 mg | DELAYED_RELEASE_TABLET | Freq: Every day | ORAL | 0 refills | Status: DC

## 2017-07-07 NOTE — Telephone Encounter (Signed)
Rx sent 

## 2017-07-14 ENCOUNTER — Other Ambulatory Visit: Payer: Self-pay | Admitting: Internal Medicine

## 2017-07-14 DIAGNOSIS — K219 Gastro-esophageal reflux disease without esophagitis: Secondary | ICD-10-CM

## 2017-07-14 MED ORDER — PANTOPRAZOLE SODIUM 20 MG PO TBEC: 20.0000 mg | DELAYED_RELEASE_TABLET | Freq: Every day | ORAL | 0 refills | Status: DC

## 2017-07-14 NOTE — Telephone Encounter (Signed)
Prescription sent to patient's and patient notified. Patient verbalized understanding.

## 2017-08-14 ENCOUNTER — Other Ambulatory Visit: Payer: Self-pay | Admitting: Internal Medicine

## 2017-08-14 DIAGNOSIS — E2839 Other primary ovarian failure: Secondary | ICD-10-CM

## 2017-09-03 ENCOUNTER — Other Ambulatory Visit: Payer: Self-pay | Admitting: Internal Medicine

## 2017-09-03 DIAGNOSIS — Z1231 Encounter for screening mammogram for malignant neoplasm of breast: Secondary | ICD-10-CM

## 2017-09-15 ENCOUNTER — Telehealth: Payer: Self-pay | Admitting: Internal Medicine

## 2017-09-15 NOTE — Telephone Encounter (Signed)
Patient actually does not need a refill. She states that she didn't realize she has 1 month left in her bottle. She will call later in February for refills. She is also advised that she will be due for office visit when it gets closer to May to which she verbalizes understanding.

## 2017-09-23 ENCOUNTER — Ambulatory Visit
Admission: RE | Admit: 2017-09-23 | Discharge: 2017-09-23 | Disposition: A | Discharge status: Home / Self Care | Payer: Medicare HMO | Source: Ambulatory Visit | Attending: Internal Medicine | Admitting: Internal Medicine

## 2017-09-23 DIAGNOSIS — Z1231 Encounter for screening mammogram for malignant neoplasm of breast: Secondary | ICD-10-CM

## 2017-10-13 ENCOUNTER — Telehealth: Payer: Self-pay | Admitting: Internal Medicine

## 2017-10-13 DIAGNOSIS — K219 Gastro-esophageal reflux disease without esophagitis: Secondary | ICD-10-CM

## 2017-10-13 MED ORDER — PANTOPRAZOLE SODIUM 20 MG PO TBEC
20.0000 mg | DELAYED_RELEASE_TABLET | Freq: Every day | ORAL | 0 refills | Status: DC
Start: 2017-10-13 — End: 2017-12-29

## 2017-10-13 NOTE — Telephone Encounter (Signed)
Rx sent 

## 2017-12-29 ENCOUNTER — Encounter: Payer: Self-pay | Admitting: Internal Medicine

## 2017-12-29 ENCOUNTER — Ambulatory Visit (INDEPENDENT_AMBULATORY_CARE_PROVIDER_SITE_OTHER): Payer: Medicare HMO | Admitting: Internal Medicine

## 2017-12-29 VITALS — BP 122/74 | HR 76 | Ht 64.0 in | Wt 187.0 lb

## 2017-12-29 DIAGNOSIS — K2100 Gastro-esophageal reflux disease with esophagitis, without bleeding: Secondary | ICD-10-CM

## 2017-12-29 DIAGNOSIS — K449 Diaphragmatic hernia without obstruction or gangrene: Secondary | ICD-10-CM | POA: Diagnosis not present

## 2017-12-29 DIAGNOSIS — Z800 Family history of malignant neoplasm of digestive organs: Secondary | ICD-10-CM

## 2017-12-29 MED ORDER — PANTOPRAZOLE SODIUM 20 MG PO TBEC: 20.0000 mg | DELAYED_RELEASE_TABLET | Freq: Every day | ORAL | 3 refills | Status: DC

## 2017-12-29 NOTE — Progress Notes (Signed)
   Subjective:    Patient ID: Paula Wong, female    DOB: 03-08-1946, 72 y.o.   MRN: [id number]  HPI Paula Wong is a 72 yo female with PMH of GERD with hx of reflux esophagitis, nonobstructing Schatzki's ring with hiatal hernia, family hx of colon cancer who is here for followup.  Last seen May 2018 and here along today.  She reports she has been doing very well.  She has remained on pantoprazole 20 mg daily which has controlled her heartburn very well.  No dysphagia or odynophagia.  No abdominal pain.  Bowel movements have been regular occurring daily without blood in her stool or melena.  No abdominal pain complaints today.  No hepatobiliary complaint.  Her last EGD and colonoscopy was in October 2015.    Review of Systems As per HPI, otherwise negative  Current Medications, Allergies, Past Medical History, Past Surgical History, Family History and Social History were reviewed in [employer] record.     Objective:   Physical Exam BP 122/74   Pulse 76   Ht 5\' 4"  (1.626 m)   Wt 187 lb (84.8 kg)   BMI 32.10 kg/m  Constitutional: Well-developed and well-nourished. No distress. HEENT: Normocephalic and atraumatic. Oropharynx is clear and moist. Conjunctivae are normal.  No scleral icterus. Neck: Neck supple. Trachea midline. Cardiovascular: Normal rate, regular rhythm and intact distal pulses.  Pulmonary/chest: Effort normal and breath sounds normal. No wheezing, rales or rhonchi. Abdominal: Soft, nontender, nondistended. Bowel sounds active throughout. There are no masses palpable. No hepatosplenomegaly. Extremities: no clubbing, cyanosis, or edema Neurological: Alert and oriented to person place and time. Skin: Skin is warm and dry. Psychiatric: Normal mood and affect. Behavior is normal.     Assessment & Plan:  72 yo female with PMH of GERD with hx of reflux esophagitis, nonobstructing Schatzki's ring with hiatal hernia, family hx of colon cancer  who is here for followup.  1.  GERD with history of reflux esophagitis/hiatal hernia and nonobstructing Schatzki's ring --doing well on pantoprazole 20 mg daily.  Previous attempt to discontinue PPI resulted in recurrent esophagitis and trouble swallowing.  For this reason we are continuing PPI therapy.  We discussed the risk, benefits and alternatives to chronic PPI use and she wishes to continue therapy.  2.  Family history of colon cancer --surveillance colonoscopy recommended October 2020  Annual follow-up, sooner if needed 15 minutes spent with the patient today. Greater than 50% was spent in counseling and coordination of care with the patient

## 2017-12-29 NOTE — Patient Instructions (Signed)
If you are age 74 or older, your body mass index should be between 23-30. Your Body mass index is 32.1 kg/m. If this is out of the aforementioned range listed, please consider follow up with your Primary Care Provider.  If you are age 38 or younger, your body mass index should be between 19-25. Your Body mass index is 32.1 kg/m. If this is out of the aformentioned range listed, please consider follow up with your Primary Care Provider.   We have sent the following medications to your pharmacy for you to pick up at your convenience: pantoprazole 20 mg   Follow up in 1 year.

## 2018-02-05 ENCOUNTER — Other Ambulatory Visit: Payer: Self-pay

## 2019-01-01 ENCOUNTER — Other Ambulatory Visit: Payer: Self-pay | Admitting: Internal Medicine

## 2019-01-01 DIAGNOSIS — K2100 Gastro-esophageal reflux disease with esophagitis, without bleeding: Secondary | ICD-10-CM

## 2019-01-01 MED ORDER — PANTOPRAZOLE SODIUM 20 MG PO TBEC: 20.0000 mg | DELAYED_RELEASE_TABLET | Freq: Every day | ORAL | 0 refills | Status: DC

## 2019-01-01 NOTE — Telephone Encounter (Signed)
Pt needs rf for pantoprazole sent to [facility] on [address] She needs 90-day supply.

## 2019-01-01 NOTE — Telephone Encounter (Signed)
Informed patient she is due for follow up visit with Dr. Hilarie Fredrickson but will send her a refill until her scheduled her appt. Patient scheduled appt for 01/19/19 at 9:00am. Prescription sent to patients pharmacy.

## 2019-01-18 ENCOUNTER — Encounter: Payer: Self-pay | Admitting: *Deleted

## 2019-01-19 ENCOUNTER — Encounter: Payer: Self-pay | Admitting: Internal Medicine

## 2019-01-19 ENCOUNTER — Other Ambulatory Visit: Payer: Self-pay

## 2019-01-19 ENCOUNTER — Ambulatory Visit (INDEPENDENT_AMBULATORY_CARE_PROVIDER_SITE_OTHER): Payer: Medicare HMO | Admitting: Internal Medicine

## 2019-01-19 VITALS — Ht 64.0 in | Wt 187.0 lb

## 2019-01-19 DIAGNOSIS — K2100 Gastro-esophageal reflux disease with esophagitis, without bleeding: Secondary | ICD-10-CM

## 2019-01-19 DIAGNOSIS — K449 Diaphragmatic hernia without obstruction or gangrene: Secondary | ICD-10-CM

## 2019-01-19 DIAGNOSIS — Z800 Family history of malignant neoplasm of digestive organs: Secondary | ICD-10-CM | POA: Diagnosis not present

## 2019-01-19 DIAGNOSIS — Z79899 Other long term (current) drug therapy: Secondary | ICD-10-CM | POA: Diagnosis not present

## 2019-01-19 MED ORDER — PANTOPRAZOLE SODIUM 20 MG PO TBEC: 20.0000 mg | DELAYED_RELEASE_TABLET | Freq: Every day | ORAL | 3 refills | Status: DC

## 2019-01-19 NOTE — Addendum Note (Signed)
Addended by: Larina Bras on: 01/19/2019 11:02 AM   Modules accepted: Orders

## 2019-01-19 NOTE — Progress Notes (Signed)
   Subjective:    Patient ID: Paula Wong, female    DOB: April 20, 1946, 73 y.o.   MRN: [id number]  This service was provided via telemedicine.  Telephone encounter The patient was located at home The provider was located in provider's GI office. The patient did consent to this telephone visit and is aware of possible charges through their insurance for this visit.   The persons participating in this telemedicine service were the patient and I. Time spent on call: 11 min   HPI Paula Wong is a 73 year old female with a past medical history of GERD with history of reflux esophagitis, nonobstructing Schatzki's ring with hiatal hernia, family history of colon cancer who seen for follow-up.  Seen virtually today in the setting of COVID-19.  Last seen in the office on 12/29/2017.  She reports that she has been doing very well.  She is maintained on pantoprazole 20 mg daily.  This controls her heartburn and reflux symptoms.  No dysphagia or odynophagia.  No nausea, vomiting.  Appetite is good.  Weight is stable.  No change in bowel habit, significant diarrhea, constipation, blood in her stool or melena.  Her diabetes is mostly controlled and she does participate in intermittent fasting.  At times if she splurges on her diet her blood sugars will go up but usually come under control quite easily.  She is done well in the pandemic but is following strict guidelines and wearing a mask in public.  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  She continues to be very involved with her church community.  Review of Systems As per HPI, otherwise negative  Current Medications, Allergies, Past Medical History, Past Surgical History, Family History and Social History were reviewed in [employer] [facility] record.     Objective:   Physical Exam Physical exam, virtual visit       Assessment & Plan:  73 year old female with a past medical history of  GERD with history of reflux esophagitis, nonobstructing Schatzki's ring with hiatal hernia, family history of colon cancer who seen for follow-up.  1.  GERD with history of reflux esophagitis/hiatal hernia with nonobstructing Schatzki's ring --symptoms stable on pantoprazole 20 mg a day.  We removed this medication before and esophagitis returned.  We discussed the risk, benefits and alternatives and I feel that the benefit exceeds risks in her case.  We will continue pantoprazole 20 mg daily --Pantoprazole 20 mg daily, refill x1 year  2.  Family history of colon cancer in her mother --she will be due surveillance colonoscopy in October this year.  We discussed this today.  She is willing and ready to proceed at that time. --Colonoscopy later this year, October

## 2019-01-19 NOTE — Patient Instructions (Signed)
We have sent the following medications to your pharmacy for you to pick up at your convenience: Pantoprazole 20 mg daily  You will be due for a recall colonoscopy in 05/2019. We will send you a reminder in the mail when it gets closer to that time.

## 2019-04-07 ENCOUNTER — Telehealth: Payer: Self-pay | Admitting: Radiation Oncology

## 2019-04-07 NOTE — Telephone Encounter (Signed)
New Message:   LVM for Kim to send me the referral and additional notes for this patient to be scheduled with Dr. Isidore Moos.

## 2019-05-17 ENCOUNTER — Telehealth: Payer: Self-pay | Admitting: Radiation Oncology

## 2019-05-17 ENCOUNTER — Encounter: Payer: Self-pay | Admitting: *Deleted

## 2019-05-17 NOTE — Progress Notes (Signed)
Histology and Location of Primary Cancer:  02/05/2018    Location of Symptomatic area: Right neck area   Patient's main complaints related to her keloid are: The keloid was removed in 01/2018. Since that time the keloid has regrown causing increased itching, burning, and irritation.   Pain on a scale of 0-10 is: She denies pain   Ambulatory status? Walker? Wheelchair?: She is ambulatory  SAFETY ISSUES:  Prior radiation? No  Pacemaker/ICD? No  Possible current pregnancy? No  Is the patient on methotrexate? No  Additional Complaints / other details:   03/31/19 Dr. Towanda Malkin:

## 2019-05-17 NOTE — Telephone Encounter (Signed)
Left message to verify telephone vist for pre reg

## 2019-05-18 ENCOUNTER — Ambulatory Visit
Admission: RE | Admit: 2019-05-18 | Discharge: 2019-05-18 | Disposition: A | Discharge status: Home / Self Care | Payer: Medicare HMO | Source: Ambulatory Visit | Attending: Radiation Oncology | Admitting: Radiation Oncology

## 2019-05-18 ENCOUNTER — Encounter: Payer: Self-pay | Admitting: Radiation Oncology

## 2019-05-18 ENCOUNTER — Other Ambulatory Visit: Payer: Self-pay

## 2019-05-18 DIAGNOSIS — L910 Hypertrophic scar: Secondary | ICD-10-CM | POA: Insufficient documentation

## 2019-05-18 NOTE — Progress Notes (Signed)
Radiation Oncology         [phone] ________________________________  Initial outpatient Consultation by phone due to pandemic precautions; patient unable to access WEbEx  Name: Paula Wong MRN: [id number]  Date: 05/18/2019  DOB: May 01, 1946  [id number], Paula Grief, MD  Paula Polio, MD   REFERRING PHYSICIAN: Cristine Polio, MD  DIAGNOSIS:    ICD-10-CM   1. Keloid  L91.0     CHIEF COMPLAINT: Here to discuss management of keloid   HISTORY OF PRESENT ILLNESS::Paula Wong is a 73 y.o. female who presented with a lesion on her right neck area. She had this removed on 02/05/2018 by Dr. Towanda Malkin in plastic surgery. Pathology from the procedure showed keloid and epidermoid cyst. She returned to him on 03/31/2019 after the keloid grew back and began causing itching, burning, and irritation.   She will return to him after our visit and my recommendation for reexamination and surgery scheduling.  The keloid is the right posterior neck, underneath the hairline, about 2 inches.  PREVIOUS RADIATION THERAPY: No  PAST MEDICAL HISTORY:  has a past medical history of Allergy, Diabetes ([place]), Diverticulosis, GERD (gastroesophageal reflux disease), Hiatal hernia, Hyperlipidemia, Hypertension, Neuropathy, and Schatzki's ring.    PAST SURGICAL HISTORY: Past Surgical History:  Procedure Laterality Date  . TOOTH EXTRACTION  2013    FAMILY HISTORY: family history includes Colon cancer (age of onset: 49) in her mother.  SOCIAL HISTORY:  reports that she quit smoking about 15 years ago. She has never used smokeless tobacco. She reports that she does not drink alcohol or use drugs.  ALLERGIES: Patient has no known allergies.  MEDICATIONS:  Current Outpatient Medications  Medication Sig Dispense Refill  . amLODipine (NORVASC) 10 MG tablet Take 10 mg by mouth daily.    Marland Kitchen gabapentin (NEURONTIN) 100 MG capsule Take 100 mg by mouth 2 (two) times daily.    Marland Kitchen glipiZIDE (GLUCOTROL) 10 MG  tablet Take 10 mg by mouth 2 (two) times daily before a meal.    . Linagliptin (TRADJENTA PO) Take 1 capsule by mouth daily.     Marland Kitchen lisinopril (PRINIVIL,ZESTRIL) 40 MG tablet Take 40 mg by mouth daily.    . magnesium oxide (MAG-OX) 400 MG tablet Take 400 mg by mouth daily.    . meloxicam (MOBIC) 15 MG tablet Take 15 mg by mouth daily as needed.     . metFORMIN (GLUCOPHAGE) 500 MG tablet Take by mouth 2 (two) times daily with a meal.    . metoprolol succinate (TOPROL-XL) 50 MG 24 hr tablet Take 50 mg by mouth 2 (two) times daily. Take with or immediately following a meal.    . Multiple Vitamin (MULTIVITAMIN) tablet Take 1 tablet by mouth daily.    . pantoprazole (PROTONIX) 20 MG tablet Take 1 tablet (20 mg total) by mouth daily. 90 tablet 3  . pravastatin (PRAVACHOL) 20 MG tablet Take 20 mg by mouth daily.    Tyler Aas FLEXTOUCH 200 UNIT/ML SOPN Take 70 Units by mouth at bedtime.     No current facility-administered medications for this encounter.     REVIEW OF SYSTEMS:  Notable for that above.   PHYSICAL EXAM: NA     LABORATORY DATA:  Lab Results  Component Value Date   WBC 5.0 06/28/2009   HGB 12.6 06/28/2009   HCT 39.6 06/28/2009   MCV 83.5 06/28/2009   PLT 337 06/28/2009   CMP     Component Value Date/Time   NA 141 05/10/2010 2003   K  4.5 05/10/2010 2003   CL 103 05/10/2010 2003   CO2 26 05/10/2010 2003   GLUCOSE 168 (H) 05/10/2010 2003   BUN 14 05/10/2010 2003   CREATININE 0.74 05/10/2010 2003   CALCIUM 9.5 05/10/2010 2003   PROT 7.7 06/28/2009 1956   ALBUMIN 4.8 06/28/2009 1956   AST 25 06/28/2009 1956   ALT 33 06/28/2009 1956   ALKPHOS 60 06/28/2009 1956   BILITOT 0.5 06/28/2009 1956        RADIOGRAPHY: No results found.    IMPRESSION/PLAN: Keloid of right neck  This is a very pleasant woman who has a keloid of the right neck.   Steroid injections have been discussed as an adjuvant therapy. She is interested in the alternative of adjuvant radiotherapy.  I  had an in depth discussion with the patient about the risks benefits and side effects of radiotherapy to right neck scar postoperatively. She understands that radiotherapy would need to be planned immediately after surgery, ideally with the first fraction delivered same day, for best results. This will be followed by 2 more fractions of radiotherapy on a daily basis. She understands that treatment will be very targeted, to a limited amount of tissue, to minimize her side effects.  She understands that the most common side effects are temporary fatigue as well as skin irritation which can result in long-term changes in skin pigmentation. She may have taste changes. The risk of secondary malignancy is very small but not nonexistent. The patient understands these risks but is still very enthusiastic about proceeding with treatment.    I plan to treat the patient's scar with tight margins to 12 Gray in 3 fractions. I will use electrons with bolus to allow a high dose to the skin with minimal deep penetration into her normal tissues.   Will refer to social work re: Scientist, research (medical).  She is pleased with this plan. She will send me a photo of her keloid after the call.  This encounter was provided by telemedicine platform telephone as she couldn't access Webex.  The patient has given verbal consent for this type of encounter and has been advised to only accept a meeting of this type in a secure network environment. The time spent during this encounter was 20 minutes. The attendants for this meeting include Eppie Gibson  and Josefine Class.  During the encounter, Eppie Gibson was located at [facility] Radiation On[facility] Department.  Talecia Greunke was located at home.   __________________________________________   Eppie Gibson, MD  This document serves as a record of services personally performed by Eppie Gibson, MD. It was created on her behalf by Wilburn Mylar, a  trained medical scribe. The creation of this record is based on the scribe's personal observations and the provider's statements to them. This document has been checked and approved by the attending provider.

## 2019-05-19 ENCOUNTER — Encounter: Payer: Self-pay | Admitting: General Practice

## 2019-05-19 NOTE — Progress Notes (Signed)
[facility] CSW Progress Notes  REferral from Dr Isidore Moos to address transportation issues for upcoming treatments.  Called patient, can ride in regular transport, needs rides for 3 treatments in rad onc.  Request sent to Transportation Services to reach out and schedule.  Referral completed as patient had no other concerns.  Edwyna Shell, LCSW Clinical Social Worker Phone:  [phone]

## 2019-05-25 ENCOUNTER — Encounter: Payer: Self-pay | Admitting: Internal Medicine

## 2019-05-31 ENCOUNTER — Encounter (HOSPITAL_BASED_OUTPATIENT_CLINIC_OR_DEPARTMENT_OTHER): Payer: Self-pay

## 2019-05-31 ENCOUNTER — Other Ambulatory Visit: Payer: Self-pay

## 2019-05-31 NOTE — Progress Notes (Signed)
Spoke with patient for pre op phone call. She states she needs to arrange for transportation for surgery and will not have anyone else to stay with her or come with for surgery. I spoke with [place]'s office, who said her surgery can be changed to a local case so patient can take transportation home. Spoke with patient, verbalized understanding.

## 2019-06-03 ENCOUNTER — Other Ambulatory Visit (HOSPITAL_COMMUNITY)
Admission: RE | Admit: 2019-06-03 | Discharge: 2019-06-03 | Disposition: A | Discharge status: Home / Self Care | Payer: Medicare HMO | Source: Ambulatory Visit | Attending: Specialist | Admitting: Specialist

## 2019-06-03 DIAGNOSIS — Z01812 Encounter for preprocedural laboratory examination: Secondary | ICD-10-CM | POA: Diagnosis present

## 2019-06-03 DIAGNOSIS — Z20828 Contact with and (suspected) exposure to other viral communicable diseases: Secondary | ICD-10-CM | POA: Insufficient documentation

## 2019-06-04 ENCOUNTER — Encounter (HOSPITAL_BASED_OUTPATIENT_CLINIC_OR_DEPARTMENT_OTHER): Payer: Self-pay | Admitting: Certified Registered Nurse Anesthetist

## 2019-06-04 ENCOUNTER — Telehealth: Payer: Self-pay | Admitting: *Deleted

## 2019-06-04 NOTE — Telephone Encounter (Signed)
Called patient to inform of appt. on 06-07-19 - arrival time- 10:45 am @ [facility] for her appt., spoke with patient and she is aware of this appt.

## 2019-06-05 LAB — NOVEL CORONAVIRUS, NAA (HOSP ORDER, SEND-OUT TO REF LAB; TAT 18-24 HRS): SARS-CoV-2, NAA: NOT DETECTED

## 2019-06-06 NOTE — Anesthesia Preprocedure Evaluation (Deleted)
Anesthesia Evaluation    Reviewed: Allergy & Precautions, H&P , Patient's Chart, lab work & pertinent test results  Airway        Dental   Pulmonary neg pulmonary ROS, former smoker,           Cardiovascular Exercise Tolerance: Good hypertension, Pt. on medications and Pt. on home beta blockers negative cardio ROS       Neuro/Psych  Neuromuscular disease negative psych ROS   GI/Hepatic Neg liver ROS, hiatal hernia, GERD  Medicated,  Endo/Other  negative endocrine ROSdiabetes  Renal/GU negative Renal ROS  negative genitourinary   Musculoskeletal   Abdominal   Peds  Hematology negative hematology ROS (+)   Anesthesia Other Findings   Reproductive/Obstetrics negative OB ROS                             Anesthesia Physical Anesthesia Plan  ASA: III  Anesthesia Plan: MAC   Post-op Pain Management:    Induction: Intravenous  PONV Risk Score and Plan: 3 and Ondansetron and Treatment may vary due to age or medical condition  Airway Management Planned: Nasal Cannula and Simple Face Mask  Additional Equipment: None  Intra-op Plan:   Post-operative Plan:   Informed Consent: I have reviewed the patients History and Physical, chart, labs and discussed the procedure including the risks, benefits and alternatives for the proposed anesthesia with the patient or authorized representative who has indicated his/her understanding and acceptance.       Plan Discussed with: CRNA, Surgeon and Anesthesiologist  Anesthesia Plan Comments: ( )       Anesthesia Quick Evaluation

## 2019-06-07 ENCOUNTER — Ambulatory Visit
Admission: RE | Admit: 2019-06-07 | Discharge: 2019-06-07 | Disposition: A | Discharge status: Home / Self Care | Payer: Medicare HMO | Source: Ambulatory Visit | Attending: Radiation Oncology | Admitting: Radiation Oncology

## 2019-06-07 ENCOUNTER — Ambulatory Visit (HOSPITAL_BASED_OUTPATIENT_CLINIC_OR_DEPARTMENT_OTHER)
Admission: RE | Admit: 2019-06-07 | Discharge: 2019-06-07 | Disposition: A | Discharge status: Home / Self Care | Payer: Medicare HMO | Attending: Specialist | Admitting: Specialist

## 2019-06-07 ENCOUNTER — Encounter (HOSPITAL_BASED_OUTPATIENT_CLINIC_OR_DEPARTMENT_OTHER): Payer: Self-pay

## 2019-06-07 ENCOUNTER — Other Ambulatory Visit: Payer: Self-pay

## 2019-06-07 ENCOUNTER — Encounter (HOSPITAL_BASED_OUTPATIENT_CLINIC_OR_DEPARTMENT_OTHER)
Admission: RE | Disposition: A | Discharge status: Home / Self Care | Payer: Self-pay | Source: Home / Self Care | Attending: Specialist

## 2019-06-07 DIAGNOSIS — K219 Gastro-esophageal reflux disease without esophagitis: Secondary | ICD-10-CM | POA: Insufficient documentation

## 2019-06-07 DIAGNOSIS — Z794 Long term (current) use of insulin: Secondary | ICD-10-CM | POA: Insufficient documentation

## 2019-06-07 DIAGNOSIS — E1140 Type 2 diabetes mellitus with diabetic neuropathy, unspecified: Secondary | ICD-10-CM | POA: Insufficient documentation

## 2019-06-07 DIAGNOSIS — L910 Hypertrophic scar: Secondary | ICD-10-CM

## 2019-06-07 DIAGNOSIS — Z79899 Other long term (current) drug therapy: Secondary | ICD-10-CM | POA: Insufficient documentation

## 2019-06-07 DIAGNOSIS — I10 Essential (primary) hypertension: Secondary | ICD-10-CM | POA: Diagnosis not present

## 2019-06-07 DIAGNOSIS — Z510 Encounter for antineoplastic radiation therapy: Secondary | ICD-10-CM | POA: Insufficient documentation

## 2019-06-07 DIAGNOSIS — E785 Hyperlipidemia, unspecified: Secondary | ICD-10-CM | POA: Insufficient documentation

## 2019-06-07 DIAGNOSIS — R221 Localized swelling, mass and lump, neck: Secondary | ICD-10-CM | POA: Diagnosis present

## 2019-06-07 HISTORY — PX: MASS EXCISION: SHX2000

## 2019-06-07 SURGERY — EXCISION MASS
Anesthesia: Monitor Anesthesia Care | Site: Neck | Laterality: Right

## 2019-06-07 MED ORDER — CHLORHEXIDINE GLUCONATE CLOTH 2 % EX PADS: 6.0000 | MEDICATED_PAD | Freq: Once | CUTANEOUS | Status: DC

## 2019-06-07 MED ORDER — LIDOCAINE HCL (PF) 1 % IJ SOLN
INTRAMUSCULAR | Status: AC
  Filled 2019-06-07: qty 30

## 2019-06-07 MED ORDER — LIDOCAINE-EPINEPHRINE (PF) 1 %-1:200000 IJ SOLN
INTRAMUSCULAR | Status: AC
  Filled 2019-06-07: qty 30

## 2019-06-07 MED ORDER — LIDOCAINE-EPINEPHRINE 0.5 %-1:200000 IJ SOLN
INTRAMUSCULAR | Status: DC | PRN
  Administered 2019-06-07: 40 mL

## 2019-06-07 MED ORDER — LIDOCAINE-EPINEPHRINE 0.5 %-1:200000 IJ SOLN
INTRAMUSCULAR | Status: AC
  Filled 2019-06-07: qty 2

## 2019-06-07 MED ORDER — BUPIVACAINE HCL (PF) 0.25 % IJ SOLN
INTRAMUSCULAR | Status: AC
  Filled 2019-06-07: qty 30

## 2019-06-07 SURGICAL SUPPLY — 67 items
APL SKNCLS STERI-STRIP NONHPOA (GAUZE/BANDAGES/DRESSINGS) ×1
BALL CTTN LRG ABS STRL LF (GAUZE/BANDAGES/DRESSINGS)
BENZOIN TINCTURE PRP APPL 2/3 (GAUZE/BANDAGES/DRESSINGS) ×2 IMPLANT
BLADE HEX COATED 2.75 (ELECTRODE) IMPLANT
BLADE KNIFE PERSONA 10 (BLADE) ×1 IMPLANT
BLADE KNIFE PERSONA 15 (BLADE) ×3 IMPLANT
BNDG CMPR 9X4 STRL LF SNTH (GAUZE/BANDAGES/DRESSINGS)
BNDG ESMARK 4X9 LF (GAUZE/BANDAGES/DRESSINGS) ×1 IMPLANT
CANISTER SUCT 1200ML W/VALVE (MISCELLANEOUS) IMPLANT
CLOSURE WOUND 1/2 X4 (GAUZE/BANDAGES/DRESSINGS) ×1
COTTONBALL LRG STERILE PKG (GAUZE/BANDAGES/DRESSINGS) IMPLANT
COVER BACK TABLE REUSABLE LG (DRAPES) ×3 IMPLANT
COVER MAYO STAND REUSABLE (DRAPES) ×3 IMPLANT
COVER WAND RF STERILE (DRAPES) IMPLANT
DRAPE HALF SHEET 70X43 (DRAPES) ×3 IMPLANT
DRAPE LAPAROSCOPIC ABDOMINAL (DRAPES) IMPLANT
DRAPE U-SHAPE 76X120 STRL (DRAPES) IMPLANT
DRSG PAD ABDOMINAL 8X10 ST (GAUZE/BANDAGES/DRESSINGS) ×3 IMPLANT
ELECT NDL BLADE 2-5/6 (NEEDLE) IMPLANT
ELECT NEEDLE BLADE 2-5/6 (NEEDLE) IMPLANT
ELECT REM PT RETURN 9FT ADLT (ELECTROSURGICAL) ×3
ELECTRODE REM PT RTRN 9FT ADLT (ELECTROSURGICAL) ×1 IMPLANT
GAUZE SPONGE 4X4 12PLY STRL (GAUZE/BANDAGES/DRESSINGS) ×3 IMPLANT
GAUZE SPONGE 4X4 12PLY STRL LF (GAUZE/BANDAGES/DRESSINGS) IMPLANT
GAUZE XEROFORM 1X8 LF (GAUZE/BANDAGES/DRESSINGS) ×5 IMPLANT
GAUZE XEROFORM 5X9 LF (GAUZE/BANDAGES/DRESSINGS) IMPLANT
GLOVE BIO SURGEON STRL SZ 6.5 (GLOVE) ×1 IMPLANT
GLOVE BIO SURGEONS STRL SZ 6.5 (GLOVE)
GLOVE BIOGEL M STRL SZ7.5 (GLOVE) ×3 IMPLANT
GLOVE BIOGEL PI IND STRL 8 (GLOVE) ×1 IMPLANT
GLOVE BIOGEL PI INDICATOR 8 (GLOVE)
GLOVE ECLIPSE 7.0 STRL STRAW (GLOVE) ×3 IMPLANT
GOWN STRL REUS W/ TWL XL LVL3 (GOWN DISPOSABLE) ×2 IMPLANT
GOWN STRL REUS W/TWL XL LVL3 (GOWN DISPOSABLE)
MARKER SKIN DUAL TIP RULER LAB (MISCELLANEOUS) ×3 IMPLANT
NDL HYPO 25X1 1.5 SAFETY (NEEDLE) ×1 IMPLANT
NDL SPNL 18GX3.5 QUINCKE PK (NEEDLE) IMPLANT
NEEDLE HYPO 25X1 1.5 SAFETY (NEEDLE) ×3 IMPLANT
NEEDLE SPNL 18GX3.5 QUINCKE PK (NEEDLE) IMPLANT
PACK BASIN DAY SURGERY FS (CUSTOM PROCEDURE TRAY) ×3 IMPLANT
PEN SKIN MARKING BROAD TIP (MISCELLANEOUS) ×3 IMPLANT
PENCIL BUTTON HOLSTER BLD 10FT (ELECTRODE) ×2 IMPLANT
PENCIL SMOKE EVACUATOR (MISCELLANEOUS) IMPLANT
SHEETING SILICONE GEL EPI DERM (MISCELLANEOUS) IMPLANT
SLEEVE SCD COMPRESS KNEE MED (MISCELLANEOUS) ×3 IMPLANT
SPONGE LAP 18X18 RF (DISPOSABLE) ×3 IMPLANT
STAPLER VISISTAT 35W (STAPLE) IMPLANT
STOCKINETTE 4X48 STRL (DRAPES) IMPLANT
STRIP CLOSURE SKIN 1/2X4 (GAUZE/BANDAGES/DRESSINGS) ×1 IMPLANT
SUCTION FRAZIER HANDLE 10FR (MISCELLANEOUS)
SUCTION TUBE FRAZIER 10FR DISP (MISCELLANEOUS) IMPLANT
SUT ETHILON 3 0 PS 1 (SUTURE) ×2 IMPLANT
SUT MNCRL AB 3-0 PS2 18 (SUTURE) IMPLANT
SUT MON AB 2-0 CT1 36 (SUTURE) IMPLANT
SUT PROLENE 4 0 P 3 18 (SUTURE) IMPLANT
SUT PROLENE 4 0 PS 2 18 (SUTURE) IMPLANT
SYR 20ML LL LF (SYRINGE) ×2 IMPLANT
SYR 50ML LL SCALE MARK (SYRINGE) IMPLANT
SYR CONTROL 10ML LL (SYRINGE) ×1 IMPLANT
TAPE HYPAFIX 6 X30' (GAUZE/BANDAGES/DRESSINGS)
TAPE HYPAFIX 6X30 (GAUZE/BANDAGES/DRESSINGS) IMPLANT
TOWEL GREEN STERILE FF (TOWEL DISPOSABLE) ×6 IMPLANT
TRAY DSU PREP LF (CUSTOM PROCEDURE TRAY) ×3 IMPLANT
TUBE CONNECTING 20'X1/4 (TUBING)
TUBE CONNECTING 20X1/4 (TUBING) IMPLANT
UNDERPAD 30X36 HEAVY ABSORB (UNDERPADS AND DIAPERS) IMPLANT
YANKAUER SUCT BULB TIP NO VENT (SUCTIONS) ×1 IMPLANT

## 2019-06-07 NOTE — Op Note (Signed)
NAME: KASSIDY, HUEBSCH MEDICAL RECORD [id number] ACCOUNT [id number] DATE OF BIRTH:August 08, 1946 FACILITY: [facility] LOCATION: [facility] PHYSICIAN:Ilayda Toda Octavia Heir, MD  OPERATIVE REPORT  DATE OF PROCEDURE:  06/07/2019  INDICATIONS:  A 73 year old lady with severe mass involving the right lateral neck area.  Increased growth and discomfort over the last few months.  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  PROCEDURE:  Wide excision of the area and plastic reconstruction with flaps.  ANESTHESIA:  Sedation local 1% with epinephrine 1:100,000 concentration, a total of 40 mL.  Prep was done to the area with Betadine solution, walled off with sterile towels and drapes so as to make a sterile field.  Marcaine was used to outline the  extent of the keloid mass.  We went out 2 cm from it to get all of the area in case it was precancerous or cancerous.  We were then able to excise the mass down to underlying subcutaneous fat.  After proper hemostasis using the Bovie unit on coagulation,  the lateral and medial flaps were freed out significantly another 3.5 cm to allow flap closure with deep sutures of 3-0 Monocryl x2 layers, a subdermal suture of 3-0 Monocryl, then a running subcuticular stitch of 3-0 Monocryl.  Steri-Strips and a soft  dressing were applied to all the areas.  She withstood the procedures very well and was taken to recovery in excellent condition.  ESTIMATED BLOOD LOSS:  40 mL.  COMPLICATIONS:  None.  LN/NUANCE  D:06/07/2019 T:06/07/2019 JOB:[id number]

## 2019-06-07 NOTE — Discharge Instructions (Signed)
Activity (include date of return to work if known) °As tolerated: NO showers °NO driving °No heavy activities ° °Diet:regular No restrictions: ° °Wound Care: Keep dressing clean & dry ° °Do not change dressings °For Abdominoplasties wear abdominal binder °Special Instructions: °Do not raise arms up °Continue to empty, recharge, & record drainage from J-P drains &/or °Hemovacs 2-3 times a day, as needed. °Call Doctor if any unusual problems occur such as pain, excessive °Bleeding, unrelieved Nausea/vomiting, Fever &/or chills °When lying down, keep head elevated on 2-3 pillows or back-rest °For Addominoplasties the Jack-knife position °Follow-up appointment: Please call the office. ° °The patient received discharge instruction from:___________________________________________ ° ° °Patient signature ________________________________________ / Date___________ ° ° ° °Signature of individual providing instructions/ Date________________             °

## 2019-06-08 ENCOUNTER — Ambulatory Visit
Admission: RE | Admit: 2019-06-08 | Discharge: 2019-06-08 | Disposition: A | Discharge status: Home / Self Care | Payer: Medicare HMO | Source: Ambulatory Visit | Attending: Radiation Oncology | Admitting: Radiation Oncology

## 2019-06-08 ENCOUNTER — Other Ambulatory Visit: Payer: Self-pay

## 2019-06-08 ENCOUNTER — Encounter (HOSPITAL_BASED_OUTPATIENT_CLINIC_OR_DEPARTMENT_OTHER): Payer: Self-pay | Admitting: Specialist

## 2019-06-08 DIAGNOSIS — Z510 Encounter for antineoplastic radiation therapy: Secondary | ICD-10-CM | POA: Diagnosis not present

## 2019-06-08 LAB — SURGICAL PATHOLOGY

## 2019-06-08 NOTE — Progress Notes (Signed)
  Radiation Oncology         [phone] ________________________________  Name: Paula Wong MRN: [id number]  Date: 06/07/2019  DOB: [birth year]  SIMULATION AND TREATMENT PLANNING NOTE  Outpatient  DIAGNOSIS:     ICD-10-CM   1. Keloid  L91.0     NARRATIVE:  The patient was brought to the CT planning suite.  Identity was confirmed.  All relevant records and images related to the planned course of therapy were reviewed.  The patient freely provided informed written consent to proceed with treatment after reviewing the details related to the planned course of therapy. The consent form was witnessed and verified by the simulation staff.     Then, the patient was set-up in a stable reproducible  supine position for radiation therapy.  An accuform was made for her head.  I drew around their tumor site with marker.    TREATMENT PLANNING NOTE: Patient was taken to [facility]. Treatment planning then occurred. En face positioning of beam was determined. The radiation prescription was entered and confirmed.     A total of 2 medically necessary complex treatment devices were fabricated and supervised by me, in the form of an Accuform head rest and customized electron cut out.   The patient will receive 12 Gy in 3 fractions to the neck scar with electrons and bolus.   -----------------------------------  Eppie Gibson, MD

## 2019-06-09 ENCOUNTER — Other Ambulatory Visit: Payer: Self-pay

## 2019-06-09 ENCOUNTER — Ambulatory Visit
Admission: RE | Admit: 2019-06-09 | Discharge: 2019-06-09 | Disposition: A | Discharge status: Home / Self Care | Payer: Medicare HMO | Source: Ambulatory Visit | Attending: Radiation Oncology | Admitting: Radiation Oncology

## 2019-06-09 DIAGNOSIS — Z510 Encounter for antineoplastic radiation therapy: Secondary | ICD-10-CM | POA: Diagnosis not present

## 2019-08-27 NOTE — Progress Notes (Signed)
  Patient Name: Paula Wong MRN: [id number] DOB: 09/06/1945 Referring Physician: Cristine Polio (Profile Not Attached) Date of Service: 06/09/2019 [facility], [place]                                                        End Of Treatment Note  Diagnoses: keloid, neck  Intent: Curative  Radiation Treatment Dates: 06/07/2019 through 06/09/2019 Site Technique Total Dose (Gy) Dose per Fx (Gy) Completed Fx Beam Energies  Head & neck: HN_Rt Complex 12/12 4 3/3 6E   Narrative: The patient tolerated radiation therapy relatively well.   Plan: The patient will follow-up with radiation oncology PRN.  ________________________________________________    Eppie Gibson, MD

## 2020-03-24 ENCOUNTER — Encounter: Payer: Self-pay | Admitting: Internal Medicine

## 2020-04-03 ENCOUNTER — Other Ambulatory Visit: Payer: Self-pay | Admitting: Internal Medicine

## 2020-04-03 DIAGNOSIS — K2100 Gastro-esophageal reflux disease with esophagitis, without bleeding: Secondary | ICD-10-CM

## 2020-05-24 ENCOUNTER — Encounter: Payer: Medicare HMO | Admitting: Internal Medicine

## 2020-06-08 ENCOUNTER — Encounter: Payer: Self-pay | Admitting: Radiation Oncology

## 2020-06-27 ENCOUNTER — Ambulatory Visit (INDEPENDENT_AMBULATORY_CARE_PROVIDER_SITE_OTHER): Payer: Medicare HMO | Admitting: Internal Medicine

## 2020-06-27 ENCOUNTER — Encounter: Payer: Self-pay | Admitting: Internal Medicine

## 2020-06-27 VITALS — BP 120/80 | HR 86 | Ht 64.0 in | Wt 183.0 lb

## 2020-06-27 DIAGNOSIS — K2100 Gastro-esophageal reflux disease with esophagitis, without bleeding: Secondary | ICD-10-CM | POA: Diagnosis not present

## 2020-06-27 DIAGNOSIS — Z800 Family history of malignant neoplasm of digestive organs: Secondary | ICD-10-CM | POA: Diagnosis not present

## 2020-06-27 MED ORDER — PANTOPRAZOLE SODIUM 20 MG PO TBEC: 20.0000 mg | DELAYED_RELEASE_TABLET | Freq: Every day | ORAL | 3 refills | Status: DC

## 2020-06-27 NOTE — Patient Instructions (Addendum)
We have sent the following medications to your pharmacy for you to pick up at your convenience: Pantoprazole 20 mg daily  We will defer any future colonoscopies due to age as per your request.  If you are age 75 or older, your body mass index should be between 23-30. Your Body mass index is 31.41 kg/m. If this is out of the aforementioned range listed, please consider follow up with your Primary Care Provider.  Due to recent changes in healthcare laws, you may see the results of your imaging and laboratory studies on MyChart before your provider has had a chance to review them.  We understand that in some cases there may be results that are confusing or concerning to you. Not all laboratory results come back in the same time frame and the provider may be waiting for multiple results in order to interpret others.  Please give [place] 48 hours in order for your provider to thoroughly review all the results before contacting the office for clarification of your results.

## 2020-06-27 NOTE — Progress Notes (Signed)
° °  Subjective:    Patient ID: Paula Wong, female    DOB: [birth year], 74 y.o.   MRN: [id number]  HPI Paula Wong is a 74 year old female with a history of GERD with reflux esophagitis, nonobstructing Schatzki's ring hiatal hernia, family history of colon cancer in her mother diagnosed at age 75 who is here for follow-up.  She is seen in person today and she is alone.  Her last office visit was in June 2020 by virtual visit.  She reports she is doing very well.  She denies specific GI complaint.  She does intermittently have loose stools which she blames on Metformin.  No abdominal pain.  No blood in stool or melena.  No change in bowel habits.  No heartburn as long she is taking pantoprazole 20 mg daily.  No dysphagia or odynophagia.  No nausea or vomiting.  Energy levels are very good and she does not have much arthritic pain.  She does have bilateral groin and hip type pain when she walks long distances.  Her last colonoscopy was normal with the exception of diverticulosis in 2015  Review of Systems As per HPI, otherwise negative  Current Medications, Allergies, Past Medical History, Past Surgical History, Family History and Social History were reviewed in [employer] record.     Objective:   Physical Exam BP 120/80    Pulse 86    Ht 5\' 4"  (1.626 m)    Wt 183 lb (83 kg)    SpO2 96%    BMI 31.41 kg/m  Gen: awake, alert, NAD HEENT: anicteric  CV: RRR, no mrg Pulm: CTA b/l Abd: soft, NT/ND, +BS throughout Ext: no c/c/e Neuro: nonfocal     Assessment & Plan:  74 year old female with a history of GERD with reflux esophagitis, nonobstructing Schatzki's ring hiatal hernia, family history of colon cancer in her mother diagnosed at age 38 who is here for follow-up.  1.  GERD with history of esophagitis --well-controlled on low-dose pantoprazole 20 mg daily.  We tried to withdraw this medicine before her GERD and esophagitis symptoms recurred.  We discussed the  risk, benefits and alternatives to this medication and I recommend that we continue therapy.  She is in agreement --Pantoprazole 20 mg daily  2.  Family history of colon cancer --we discussed that based on her mother's history that she is higher than average risk for colon cancer.  However this is likely slight given that her mother was 52 when she developed colon cancer.  She had a normal colonoscopy in 2015.  We discussed guidelines which recommend repeating in 5 years which would have been last year.  We discussed this at length and after discussion she does not wish to pursue further colonoscopy for screening.  She understands the small risk of that we may miss a lesion that could be precancerous and remedied/removed by colonoscopy.  Even with this understanding she is comfortable stopping colonoscopy for screening based on age.  I think this is reasonable. --Discontinue colonoscopy for screening based on age  Annual follow-up, sooner if needed  20 minutes total spent today including patient facing time, coordination of care, reviewing medical history/procedures/pertinent radiology studies, and documentation of the encounter.

## 2020-10-02 ENCOUNTER — Other Ambulatory Visit: Payer: Self-pay | Admitting: Internal Medicine

## 2020-10-03 LAB — COMPLETE METABOLIC PANEL WITH GFR
AG Ratio: 1.3 (calc) (ref 1.0–2.5)
ALT: 23 U/L (ref 6–29)
AST: 27 U/L (ref 10–35)
Albumin: 4.4 g/dL (ref 3.6–5.1)
Alkaline phosphatase (APISO): 57 U/L (ref 37–153)
BUN: 11 mg/dL (ref 7–25)
CO2: 27 mmol/L (ref 20–32)
Calcium: 9.6 mg/dL (ref 8.6–10.4)
Chloride: 103 mmol/L (ref 98–110)
Creat: 0.83 mg/dL (ref 0.60–0.93)
GFR, Est African American: 80 mL/min/{1.73_m2} (ref >=60)
GFR, Est Non African American: 69 mL/min/{1.73_m2} (ref >=60)
Globulin: 3.3 g/dL (calc) (ref 1.9–3.7)
Glucose, Bld: 138 mg/dL — ABNORMAL HIGH (ref 65–99)
Potassium: 4.3 mmol/L (ref 3.5–5.3)
Sodium: 142 mmol/L (ref 135–146)
Total Bilirubin: 0.8 mg/dL (ref 0.2–1.2)
Total Protein: 7.7 g/dL (ref 6.1–8.1)

## 2020-10-03 LAB — LIPID PANEL
Cholesterol: 159 mg/dL (ref <200)
HDL: 41 mg/dL — ABNORMAL LOW (ref >=50)
LDL Cholesterol (Calc): 90 mg/dL (calc)
Non-HDL Cholesterol (Calc): 118 mg/dL (calc) (ref <130)
Total CHOL/HDL Ratio: 3.9 (calc) (ref <5.0)
Triglycerides: 181 mg/dL — ABNORMAL HIGH (ref <150)

## 2020-10-03 LAB — CBC
HCT: 40.3 % (ref 35.0–45.0)
Hemoglobin: 13.3 g/dL (ref 11.7–15.5)
MCH: 27.4 pg (ref 27.0–33.0)
MCHC: 33 g/dL (ref 32.0–36.0)
MCV: 83.1 fL (ref 80.0–100.0)
MPV: 10.5 fL (ref 7.5–12.5)
Platelets: 302 10*3/uL (ref 140–400)
RBC: 4.85 10*6/uL (ref 3.80–5.10)
RDW: 13.2 % (ref 11.0–15.0)
WBC: 5 10*3/uL (ref 3.8–10.8)

## 2020-10-03 LAB — TSH: TSH: 2.08 mIU/L (ref 0.40–4.50)

## 2021-05-23 ENCOUNTER — Other Ambulatory Visit: Payer: Self-pay | Admitting: Registered Nurse

## 2021-05-23 DIAGNOSIS — E2839 Other primary ovarian failure: Secondary | ICD-10-CM

## 2021-06-08 ENCOUNTER — Other Ambulatory Visit: Payer: Self-pay | Admitting: Registered Nurse

## 2021-06-08 DIAGNOSIS — Z1231 Encounter for screening mammogram for malignant neoplasm of breast: Secondary | ICD-10-CM

## 2021-07-09 ENCOUNTER — Other Ambulatory Visit: Payer: Self-pay | Admitting: Internal Medicine

## 2021-07-09 DIAGNOSIS — K2100 Gastro-esophageal reflux disease with esophagitis, without bleeding: Secondary | ICD-10-CM

## 2021-07-16 ENCOUNTER — Other Ambulatory Visit: Payer: Self-pay

## 2021-07-16 ENCOUNTER — Ambulatory Visit
Admission: RE | Admit: 2021-07-16 | Discharge: 2021-07-16 | Disposition: A | Discharge status: Home / Self Care | Payer: Medicaid Other | Source: Ambulatory Visit | Attending: Registered Nurse | Admitting: Registered Nurse

## 2021-07-16 DIAGNOSIS — Z1231 Encounter for screening mammogram for malignant neoplasm of breast: Secondary | ICD-10-CM

## 2021-08-24 ENCOUNTER — Ambulatory Visit (INDEPENDENT_AMBULATORY_CARE_PROVIDER_SITE_OTHER): Payer: 59 | Admitting: Internal Medicine

## 2021-08-24 ENCOUNTER — Encounter: Payer: Self-pay | Admitting: Internal Medicine

## 2021-08-24 VITALS — BP 142/80 | HR 95 | Ht 64.0 in | Wt 190.0 lb

## 2021-08-24 DIAGNOSIS — K222 Esophageal obstruction: Secondary | ICD-10-CM

## 2021-08-24 DIAGNOSIS — K2100 Gastro-esophageal reflux disease with esophagitis, without bleeding: Secondary | ICD-10-CM

## 2021-08-24 MED ORDER — PANTOPRAZOLE SODIUM 20 MG PO TBEC: 20.0000 mg | DELAYED_RELEASE_TABLET | Freq: Every day | ORAL | 3 refills | Status: DC

## 2021-08-24 NOTE — Progress Notes (Signed)
° °  Subjective:    Patient ID: Paula Wong, female    DOB: 12-04-1945, 76 y.o.   MRN: [id number]  HPI Paula Wong is a 76 year old female with a history of GERD with reflux esophagitis, nonobstructing Schatzki's ring, small hiatal hernia, family history of colon cancer in her mother who is here for follow-up.  She is seen alone today and was last seen in November 2021.  She reports she has been doing very well.  She is using pantoprazole 20 mg daily and this controls her heartburn very well.  No dysphagia or odynophagia.  Good appetite.  No abdominal pain.  No change in bowel habits.  No blood in stool or melena.  Her sister has been diagnosed with uterine cancer, had a hysterectomy and is getting ready to start chemotherapy.   Review of Systems As per HPI, otherwise negative  Current Medications, Allergies, Past Medical History, Past Surgical History, Family History and Social History were reviewed in [employer] record.    Objective:   Physical Exam BP (!) 142/80    Pulse 95    Ht 5\' 4"  (1.626 m)    Wt 190 lb (86.2 kg)    BMI 32.61 kg/m  Gen: awake, alert, NAD HEENT: anicteric\ CV: RRR, no mrg Pulm: CTA b/l Abd: soft, NT/ND, +BS throughout Ext: no c/c/e Neuro: nonfocal      Assessment & Plan:  76 year old female with a history of GERD with reflux esophagitis, nonobstructing Schatzki's ring, small hiatal hernia, family history of colon cancer in her mother who is here for follow-up.   GERD with history of esophagitis/Schatzki's ring --symptoms well controlled on low-dose pantoprazole 20 mg daily.  We tried to eliminate this medication and symptoms have recurred.  We discussed the risk, benefits and alternatives to this medication and she wishes to continue therapy. --Continue pantoprazole 20 mg daily  2.  Family history of colorectal cancer in mother --patient had a normal colonoscopy with the exception of diverticulosis in 2015.  We had a long  discussion at last visit about repeat colonoscopy for screening versus discontinuation of screening based on age.  She is confident in her decision to discontinue screening based on age.  She can return in 1 to 2 years, sooner if needed  20 minutes total spent today including patient facing time, coordination of care, reviewing medical history/procedures/pertinent radiology studies, and documentation of the encounter.

## 2021-08-24 NOTE — Patient Instructions (Signed)
Continue Pantoprazole 20 mg daily  Please follow up with Dr Hilarie Fredrickson in the office in 12-18 months.  If you are age 77 or older, your body mass index should be between 23-30. Your Body mass index is 32.61 kg/m. If this is out of the aforementioned range listed, please consider follow up with your Primary Care Provider.  If you are age 13 or younger, your body mass index should be between 19-25. Your Body mass index is 32.61 kg/m. If this is out of the aformentioned range listed, please consider follow up with your Primary Care Provider.   ________________________________________________________  The [facility] providers would like to encourage you to use [facility] to communicate with providers for non-urgent requests or questions.  Due to long hold times on the telephone, sending your provider a message by [facility] may be a faster and more efficient way to get a response.  Please allow 48 business hours for a response.  Please remember that this is for non-urgent requests.  _______________________________________________________  Due to recent changes in healthcare laws, you may see the results of your imaging and laboratory studies on MyChart before your provider has had a chance to review them.  We understand that in some cases there may be results that are confusing or concerning to you. Not all laboratory results come back in the same time frame and the provider may be waiting for multiple results in order to interpret others.  Please give [place] 48 hours in order for your provider to thoroughly review all the results before contacting the office for clarification of your results.

## 2021-11-07 ENCOUNTER — Ambulatory Visit
Admission: RE | Admit: 2021-11-07 | Discharge: 2021-11-07 | Disposition: A | Discharge status: Home / Self Care | Payer: 59 | Source: Ambulatory Visit | Attending: Registered Nurse | Admitting: Registered Nurse

## 2021-11-07 DIAGNOSIS — E2839 Other primary ovarian failure: Secondary | ICD-10-CM

## 2021-11-19 ENCOUNTER — Other Ambulatory Visit (HOSPITAL_COMMUNITY): Payer: Self-pay | Admitting: Registered Nurse

## 2021-11-19 DIAGNOSIS — I10 Essential (primary) hypertension: Secondary | ICD-10-CM

## 2021-12-04 ENCOUNTER — Ambulatory Visit (HOSPITAL_COMMUNITY)
Admission: RE | Admit: 2021-12-04 | Discharge: 2021-12-04 | Disposition: A | Discharge status: Home / Self Care | Payer: Medicare Other | Source: Ambulatory Visit | Attending: Registered Nurse | Admitting: Registered Nurse

## 2021-12-04 DIAGNOSIS — I10 Essential (primary) hypertension: Secondary | ICD-10-CM | POA: Diagnosis present

## 2021-12-04 DIAGNOSIS — I340 Nonrheumatic mitral (valve) insufficiency: Secondary | ICD-10-CM | POA: Diagnosis not present

## 2021-12-04 DIAGNOSIS — I517 Cardiomegaly: Secondary | ICD-10-CM | POA: Diagnosis not present

## 2021-12-04 LAB — ECHOCARDIOGRAM COMPLETE
Calc EF: 20.3 %
P 1/2 time: 421 msec
Radius: 0.4 cm
S' Lateral: 4.3 cm
Single Plane A2C EF: 20.9 %
Single Plane A4C EF: 20.5 %

## 2021-12-04 NOTE — Progress Notes (Signed)
?  Echocardiogram ?2D Echocardiogram has been performed. ? ?Paula Wong ?12/04/2021, 9:02 AM ?

## 2022-04-01 ENCOUNTER — Other Ambulatory Visit (HOSPITAL_COMMUNITY): Payer: Self-pay | Admitting: Registered Nurse

## 2022-04-01 DIAGNOSIS — I509 Heart failure, unspecified: Secondary | ICD-10-CM

## 2022-04-08 ENCOUNTER — Telehealth (HOSPITAL_COMMUNITY): Payer: Self-pay | Admitting: Radiology

## 2022-04-08 NOTE — Telephone Encounter (Signed)
Called and left message with Nicolette at [facility], concerning stress echocardiogram. Spoke with Dr. Margaretann Loveless, regarding stress echocardiogram order placed by Wyvonna Plum PA at [facility]. It was decided that this is not appropriate due to the already reduced left ventricular ejection fraction. The best of course of action, would to have a cardiology consult for the patient. Dr. Margaretann Loveless is willing to see patient, next week if agreed. Nicholette will have someone return the call.

## 2022-04-09 ENCOUNTER — Other Ambulatory Visit: Payer: Self-pay | Admitting: Registered Nurse

## 2022-04-09 ENCOUNTER — Other Ambulatory Visit (HOSPITAL_COMMUNITY): Payer: Self-pay | Admitting: Registered Nurse

## 2022-04-09 DIAGNOSIS — G459 Transient cerebral ischemic attack, unspecified: Secondary | ICD-10-CM

## 2022-04-10 ENCOUNTER — Other Ambulatory Visit (HOSPITAL_COMMUNITY): Payer: Self-pay | Admitting: Registered Nurse

## 2022-04-10 DIAGNOSIS — G459 Transient cerebral ischemic attack, unspecified: Secondary | ICD-10-CM

## 2022-04-11 ENCOUNTER — Ambulatory Visit (HOSPITAL_BASED_OUTPATIENT_CLINIC_OR_DEPARTMENT_OTHER)
Admission: RE | Admit: 2022-04-11 | Discharge: 2022-04-11 | Disposition: A | Discharge status: Home / Self Care | Payer: Medicare Other | Source: Ambulatory Visit | Attending: Registered Nurse | Admitting: Registered Nurse

## 2022-04-11 ENCOUNTER — Ambulatory Visit (HOSPITAL_COMMUNITY)
Admission: RE | Admit: 2022-04-11 | Discharge: 2022-04-11 | Disposition: A | Discharge status: Home / Self Care | Payer: Medicare Other | Source: Ambulatory Visit | Attending: Registered Nurse | Admitting: Registered Nurse

## 2022-04-11 DIAGNOSIS — G459 Transient cerebral ischemic attack, unspecified: Secondary | ICD-10-CM

## 2022-04-11 NOTE — Progress Notes (Signed)
Carotid artery duplex has been completed. Preliminary results can be found in CV Proc through chart review.   04/11/22 2:54 PM Carlos Levering RVT

## 2022-04-12 ENCOUNTER — Ambulatory Visit (INDEPENDENT_AMBULATORY_CARE_PROVIDER_SITE_OTHER): Payer: Medicare Other | Admitting: Podiatrist

## 2022-04-12 ENCOUNTER — Emergency Department (HOSPITAL_COMMUNITY): Payer: Medicare Other

## 2022-04-12 ENCOUNTER — Encounter: Payer: Self-pay | Admitting: Podiatrist

## 2022-04-12 ENCOUNTER — Encounter (HOSPITAL_COMMUNITY): Payer: Self-pay | Admitting: Emergency Medicine

## 2022-04-12 ENCOUNTER — Inpatient Hospital Stay (HOSPITAL_COMMUNITY)
Admission: EM | Admit: 2022-04-12 | Discharge: 2022-04-17 | DRG: 041 | Disposition: A | Discharge status: Home / Self Care | Payer: Medicare Other | Attending: Family Medicine | Admitting: Family Medicine

## 2022-04-12 ENCOUNTER — Other Ambulatory Visit: Payer: Self-pay

## 2022-04-12 DIAGNOSIS — I5042 Chronic combined systolic (congestive) and diastolic (congestive) heart failure: Secondary | ICD-10-CM | POA: Diagnosis present

## 2022-04-12 DIAGNOSIS — I63532 Cerebral infarction due to unspecified occlusion or stenosis of left posterior cerebral artery: Secondary | ICD-10-CM | POA: Diagnosis not present

## 2022-04-12 DIAGNOSIS — Z808 Family history of malignant neoplasm of other organs or systems: Secondary | ICD-10-CM

## 2022-04-12 DIAGNOSIS — E785 Hyperlipidemia, unspecified: Secondary | ICD-10-CM | POA: Diagnosis present

## 2022-04-12 DIAGNOSIS — M79609 Pain in unspecified limb: Secondary | ICD-10-CM

## 2022-04-12 DIAGNOSIS — H538 Other visual disturbances: Secondary | ICD-10-CM | POA: Diagnosis present

## 2022-04-12 DIAGNOSIS — I110 Hypertensive heart disease with heart failure: Secondary | ICD-10-CM | POA: Diagnosis present

## 2022-04-12 DIAGNOSIS — K219 Gastro-esophageal reflux disease without esophagitis: Secondary | ICD-10-CM | POA: Diagnosis present

## 2022-04-12 DIAGNOSIS — Z87891 Personal history of nicotine dependence: Secondary | ICD-10-CM

## 2022-04-12 DIAGNOSIS — I2510 Atherosclerotic heart disease of native coronary artery without angina pectoris: Secondary | ICD-10-CM | POA: Diagnosis present

## 2022-04-12 DIAGNOSIS — I5022 Chronic systolic (congestive) heart failure: Secondary | ICD-10-CM

## 2022-04-12 DIAGNOSIS — I493 Ventricular premature depolarization: Secondary | ICD-10-CM | POA: Diagnosis present

## 2022-04-12 DIAGNOSIS — B351 Tinea unguium: Secondary | ICD-10-CM | POA: Diagnosis not present

## 2022-04-12 DIAGNOSIS — I63432 Cerebral infarction due to embolism of left posterior cerebral artery: Principal | ICD-10-CM | POA: Diagnosis present

## 2022-04-12 DIAGNOSIS — Z20822 Contact with and (suspected) exposure to covid-19: Secondary | ICD-10-CM | POA: Diagnosis present

## 2022-04-12 DIAGNOSIS — Z8249 Family history of ischemic heart disease and other diseases of the circulatory system: Secondary | ICD-10-CM

## 2022-04-12 DIAGNOSIS — Z5982 Transportation insecurity: Secondary | ICD-10-CM

## 2022-04-12 DIAGNOSIS — Z7984 Long term (current) use of oral hypoglycemic drugs: Secondary | ICD-10-CM

## 2022-04-12 DIAGNOSIS — Z800 Family history of malignant neoplasm of digestive organs: Secondary | ICD-10-CM

## 2022-04-12 DIAGNOSIS — I10 Essential (primary) hypertension: Secondary | ICD-10-CM

## 2022-04-12 DIAGNOSIS — E1140 Type 2 diabetes mellitus with diabetic neuropathy, unspecified: Secondary | ICD-10-CM | POA: Diagnosis present

## 2022-04-12 DIAGNOSIS — Z79899 Other long term (current) drug therapy: Secondary | ICD-10-CM

## 2022-04-12 DIAGNOSIS — E119 Type 2 diabetes mellitus without complications: Secondary | ICD-10-CM

## 2022-04-12 DIAGNOSIS — R931 Abnormal findings on diagnostic imaging of heart and coronary circulation: Secondary | ICD-10-CM

## 2022-04-12 DIAGNOSIS — Z791 Long term (current) use of non-steroidal anti-inflammatories (NSAID): Secondary | ICD-10-CM

## 2022-04-12 DIAGNOSIS — R29702 NIHSS score 2: Secondary | ICD-10-CM | POA: Diagnosis present

## 2022-04-12 DIAGNOSIS — Q2112 Patent foramen ovale: Secondary | ICD-10-CM

## 2022-04-12 DIAGNOSIS — I639 Cerebral infarction, unspecified: Secondary | ICD-10-CM | POA: Diagnosis present

## 2022-04-12 DIAGNOSIS — I77810 Thoracic aortic ectasia: Secondary | ICD-10-CM | POA: Diagnosis present

## 2022-04-12 DIAGNOSIS — E1165 Type 2 diabetes mellitus with hyperglycemia: Secondary | ICD-10-CM | POA: Diagnosis present

## 2022-04-12 LAB — URINALYSIS, ROUTINE W REFLEX MICROSCOPIC
Bacteria, UA: NONE SEEN
Bilirubin Urine: NEGATIVE
Glucose, UA: >=500 mg/dL — AB
Hgb urine dipstick: NEGATIVE
Ketones, ur: NEGATIVE mg/dL
Nitrite: NEGATIVE
Protein, ur: NEGATIVE mg/dL
Specific Gravity, Urine: 1.028 (ref 1.005–1.030)
pH: 5 (ref 5.0–8.0)

## 2022-04-12 LAB — COMPREHENSIVE METABOLIC PANEL
ALT: 15 U/L (ref 0–44)
AST: 21 U/L (ref 15–41)
Albumin: 4.3 g/dL (ref 3.5–5.0)
Alkaline Phosphatase: 41 U/L (ref 38–126)
Anion gap: 10 (ref 5–15)
BUN: 16 mg/dL (ref 8–23)
CO2: 24 mmol/L (ref 22–32)
Calcium: 9.8 mg/dL (ref 8.9–10.3)
Chloride: 108 mmol/L (ref 98–111)
Creatinine, Ser: 0.94 mg/dL (ref 0.44–1.00)
GFR, Estimated: >60 mL/min (ref >60)
Glucose, Bld: 137 mg/dL — ABNORMAL HIGH (ref 70–99)
Potassium: 3.8 mmol/L (ref 3.5–5.1)
Sodium: 142 mmol/L (ref 135–145)
Total Bilirubin: 0.9 mg/dL (ref 0.3–1.2)
Total Protein: 7.8 g/dL (ref 6.5–8.1)

## 2022-04-12 LAB — CBC WITH DIFFERENTIAL/PLATELET
Abs Immature Granulocytes: 0.02 10*3/uL (ref 0.00–0.07)
Basophils Absolute: 0 10*3/uL (ref 0.0–0.1)
Basophils Relative: 1 %
Eosinophils Absolute: 0.1 10*3/uL (ref 0.0–0.5)
Eosinophils Relative: 2 %
HCT: 41 % (ref 36.0–46.0)
Hemoglobin: 13.5 g/dL (ref 12.0–15.0)
Immature Granulocytes: 0 %
Lymphocytes Relative: 37 %
Lymphs Abs: 2.1 10*3/uL (ref 0.7–4.0)
MCH: 27.7 pg (ref 26.0–34.0)
MCHC: 32.9 g/dL (ref 30.0–36.0)
MCV: 84.2 fL (ref 80.0–100.0)
Monocytes Absolute: 0.6 10*3/uL (ref 0.1–1.0)
Monocytes Relative: 10 %
Neutro Abs: 2.9 10*3/uL (ref 1.7–7.7)
Neutrophils Relative %: 50 %
Platelets: 270 10*3/uL (ref 150–400)
RBC: 4.87 MIL/uL (ref 3.87–5.11)
RDW: 13.9 % (ref 11.5–15.5)
WBC: 5.8 10*3/uL (ref 4.0–10.5)
nRBC: 0 % (ref 0.0–0.2)

## 2022-04-12 LAB — RESP PANEL BY RT-PCR (FLU A&B, COVID) ARPGX2
Influenza A by PCR: NEGATIVE
Influenza B by PCR: NEGATIVE
SARS Coronavirus 2 by RT PCR: NEGATIVE

## 2022-04-12 LAB — RAPID URINE DRUG SCREEN, HOSP PERFORMED
Amphetamines: NOT DETECTED
Barbiturates: NOT DETECTED
Benzodiazepines: NOT DETECTED
Cocaine: NOT DETECTED
Opiates: NOT DETECTED
Tetrahydrocannabinol: NOT DETECTED

## 2022-04-12 LAB — APTT: aPTT: 35 seconds (ref 24–36)

## 2022-04-12 LAB — PROTIME-INR
INR: 1 (ref 0.8–1.2)
Prothrombin Time: 13 seconds (ref 11.4–15.2)

## 2022-04-12 MED ORDER — ASPIRIN 325 MG PO TABS
325.0000 mg | ORAL_TABLET | Freq: Every day | ORAL | Status: DC
  Administered 2022-04-13: 325 mg via ORAL
  Filled 2022-04-12: qty 1

## 2022-04-12 MED ORDER — GADOBUTROL 1 MMOL/ML IV SOLN
8.5000 mL | Freq: Once | INTRAVENOUS | Status: AC | PRN
  Administered 2022-04-12: 8.5 mL via INTRAVENOUS

## 2022-04-12 NOTE — ED Triage Notes (Signed)
Pt reports being sent by her PCP due to her CT showing swelling on her brain on her CT.

## 2022-04-12 NOTE — ED Provider Triage Note (Signed)
Emergency Medicine Provider Triage Evaluation Note  Paula Wong , a 76 y.o. female  was evaluated in triage.  Pt complains of abnormal CT. States that she has had an intermittent headache and blurred vision since Sunday. Called her pcp for same who scheduled a CT head which showed  Edema in the left occipital lobe. This could potentially represent an acute or subacute left PCA territory infarct; however, the edema pattern is somewhat atypical (appearing more vasogenic than cytotoxic). Therefore, recommend MRI (preferably with contrast) to further evaluate and exclude other etiologies.  She was sent here for further evaluation. Currently, she states that she doesn't have a headache but does have mild blurred vision. States that her headache has been on the left side and into her face. Relieved with ibuprofen  Review of Systems  Positive:  Negative:   Physical Exam  BP 135/83 (BP Location: Right Arm)   Pulse 95   Temp 99.3 F (37.4 C) (Oral)   Resp 15   SpO2 94%  Gen:   Awake, no distress   Resp:  Normal effort  MSK:   Moves extremities without difficulty  Other:  Alert and oriented and neurologically intact without focal deficits  Medical Decision Making  Medically screening exam initiated at 5:45 PM.  Appropriate orders placed.  Dior Dominik was informed that the remainder of the evaluation will be completed by another provider, this initial triage assessment does not replace that evaluation, and the importance of remaining in the ED until their evaluation is complete.  MRI brain with contrast ordered   Bud Face, PA-C 04/12/22 1748

## 2022-04-12 NOTE — ED Provider Notes (Signed)
Patient here  w/ onset of HA, left face pain and left visual field cut.  Symptoms began 4 days ago.  Outpatient CT ordered showed possible stroke, MRI confirmed subacute stroke.  History of diabetes hypertension hyperlipidemia.  Physical Exam  BP 131/74   Pulse 87   Temp 98.2 F (36.8 C) (Oral)   Resp 17   SpO2 100%   Physical Exam  Procedures  Procedures  ED Course / MDM    Medical Decision Making Amount and/or Complexity of Data Reviewed Labs: ordered. Radiology: ordered.  Risk Prescription drug management. Decision regarding hospitalization.   Case discussed with Dr. Curly Shores and Dr. Nevada Crane- pt will be admitted.       Adianna, Darwin, PA-C 04/13/22 1624    Lacretia Leigh, MD 04/15/22 [phone]

## 2022-04-12 NOTE — Progress Notes (Signed)
Chief Complaint  Patient presents with   Nail Problem    DFC, nail . BS checked yesterday and it was 120. Does not know exact A1C result.    Subjective: Paula Wong is a 76 y.o. female patient with history of diabetes who presents to office today with a chief concern of long,mildly painful nails  while ambulating in shoes; unable to trim.  Patient denies any new cramping, numbness, burning or tingling in the legs.   Arthur Holms, NP  is her primary care provider.  Patient Active Problem List   Diagnosis Date Noted   Keloid 05/18/2019   Family history of colon cancer in mother 05/05/2014   GERD (gastroesophageal reflux disease) 05/05/2014   Current Outpatient Medications on File Prior to Visit  Medication Sig Dispense Refill   amLODipine (NORVASC) 10 MG tablet Take 10 mg by mouth daily.     gabapentin (NEURONTIN) 100 MG capsule Take 100 mg by mouth 2 (two) times daily.     glipiZIDE (GLUCOTROL) 10 MG tablet Take 10 mg by mouth 2 (two) times daily before a meal.     hydrochlorothiazide (HYDRODIURIL) 25 MG tablet Take 25 mg by mouth daily.     Linagliptin (TRADJENTA PO) Take 1 capsule by mouth daily.      lisinopril (PRINIVIL,ZESTRIL) 40 MG tablet Take 40 mg by mouth daily.     magnesium oxide (MAG-OX) 400 MG tablet Take 400 mg by mouth daily.     meloxicam (MOBIC) 15 MG tablet Take 15 mg by mouth daily as needed.      metFORMIN (GLUCOPHAGE) 500 MG tablet Take by mouth 2 (two) times daily with a meal.     metoprolol succinate (TOPROL-XL) 50 MG 24 hr tablet Take 50 mg by mouth 2 (two) times daily. Take with or immediately following a meal.     Multiple Vitamin (MULTIVITAMIN) tablet Take 1 tablet by mouth daily.     OZEMPIC, 0.25 OR 0.5 MG/DOSE, 2 MG/1.5ML SOPN Inject 0.5 mg into the skin once a week.     pantoprazole (PROTONIX) 20 MG tablet Take 1 tablet (20 mg total) by mouth daily. 90 tablet 3   pravastatin (PRAVACHOL) 20 MG tablet Take 20 mg by mouth daily.     TRESIBA FLEXTOUCH  200 UNIT/ML SOPN Take 80 Units by mouth at bedtime.      No current facility-administered medications on file prior to visit.   No Known Allergies    Objective: General: Patient is awake, alert, and oriented x 3 and in no acute distress.  Integument: Skin is warm, dry and supple bilateral. Nails are tender, long, thickened and  dystrophic with subungual debris, consistent with onychomycosis, 1-5 bilateral. No signs of infection. No open lesions or preulcerative lesions present bilateral. Remaining integument unremarkable.  Vasculature:  Dorsalis Pedis pulse 2/4 bilateral. Posterior Tibial pulse  1/4 bilateral.  Capillary fill time <3 sec 1-5 bilateral. Temperature gradient within normal limits. No varicosities present bilateral. trace edema present bilateral.   Neurology: The patient has decreased sensation measured with a 5.07/10g Semmes Weinstein Monofilament at  5/5 pedal sites bilateral . Vibratory sensation diminished bilateral with tuning fork. No Babinski sign present bilateral.   Musculoskeletal: No symptomatic pedal deformities noted bilateral. Mild digital contracture lesser digits left foot noted. Muscular strength 5/5 in all lower extremity muscular groups bilateral without pain on range of motion . No tenderness with calf compression bilateral.  Assessment and Plan:   ICD-10-CM   1. Pain due to onychomycosis of  nail  B35.1    M79.609        -Examined patient. -Mechanically debrided all nails 1-5 bilateral using sterile nail nipper and filed with dremel without incident  -Answered all patient questions -Patient to return  in 3 months for at risk foot care -Patient advised to call the office if any problems or questions arise in the meantime.  Bronson Ing, DPM

## 2022-04-12 NOTE — Progress Notes (Signed)
Need 20G or 18G near the antecubital space for CTA Angio scan.

## 2022-04-12 NOTE — Patient Instructions (Signed)
Diabetes Mellitus and Foot Care Foot care is an important part of your health, especially when you have diabetes. Diabetes may cause you to have problems because of poor blood flow (circulation) to your feet and legs, which can cause your skin to: Become thinner and drier. Break more easily. Heal more slowly. Peel and crack. You may also have nerve damage (neuropathy) in your legs and feet, causing decreased feeling in them. This means that you may not notice minor injuries to your feet that could lead to more serious problems. Noticing and addressing any potential problems early is the best way to prevent future foot problems. How to care for your feet Foot hygiene Wash your feet daily with warm water and mild soap. Do not use hot water. Then, pat your feet and the areas between your toes until they are completely dry. Do not soak your feet as this can dry your skin. You may File the edges of your nails with an emery board or nail file. Apply a moisturizing lotion or petroleum jelly to the skin on your feet and to dry, brittle toenails. Use lotion that does not contain alcohol and is unscented. Do not apply lotion between your toes. Shoes and socks Wear clean socks or stockings every day. Make sure they are not too tight. Do not wear knee-high stockings since they may decrease blood flow to your legs. Wear shoes that fit properly and have enough cushioning. Always look in your shoes before you put them on to be sure there are no objects inside. To break in new shoes, wear them for just a few hours a day. This prevents injuries on your feet. Wounds, scrapes, corns, and calluses  Check your feet daily for blisters, cuts, bruises, sores, and redness. If you cannot see the bottom of your feet, use a mirror or ask someone for help. Do not cut corns or calluses or try to remove them with medicine. If you find a minor scrape, cut, or break in the skin on your feet, keep it and the skin around it clean  and dry. You may clean these areas with mild soap and water. Do not clean the area with peroxide, alcohol, or iodine. If you have a wound, scrape, corn, or callus on your foot, look at it several times a day to make sure it is healing and not infected. Check for: Redness, swelling, or pain. Fluid or blood. Warmth. Pus or a bad smell. General tips Do not cross your legs. This may decrease blood flow to your feet. Do not use heating pads or hot water bottles on your feet. They may burn your skin. If you have lost feeling in your feet or legs, you may not know this is happening until it is too late. Protect your feet from hot and cold by wearing shoes, such as at the beach or on hot pavement. Schedule a complete foot exam at least once a year (annually) or more often if you have foot problems. Report any cuts, sores, or bruises to your health care provider immediately. Where to find more information American Diabetes Association: www.diabetes.org Association of Diabetes Care & Education Specialists: www.diabeteseducator.org Contact a health care provider if: You have a medical condition that increases your risk of infection and you have any cuts, sores, or bruises on your feet. You have an injury that is not healing. You have redness on your legs or feet. You feel burning or tingling in your legs or feet. You have pain or  cramps in your legs and feet. Your legs or feet are numb. Your feet always feel cold. You have pain around any toenails. Get help right away if: You have a wound, scrape, corn, or callus on your foot and: You have pain, swelling, or redness that gets worse. You have fluid or blood coming from the wound, scrape, corn, or callus. Your wound, scrape, corn, or callus feels warm to the touch. You have pus or a bad smell coming from the wound, scrape, corn, or callus. You have a fever. You have a red line going up your leg. Summary Check your feet every day for blisters,  cuts, bruises, sores, and redness. Apply a moisturizing lotion or petroleum jelly to the skin on your feet and to dry, brittle toenails. Wear shoes that fit properly and have enough cushioning. If you have foot problems, report any cuts, sores, or bruises to your health care provider immediately. Schedule a complete foot exam at least once a year (annually) or more often if you have foot problems. This information is not intended to replace advice given to you by your health care provider. Make sure you discuss any questions you have with your health care provider. Document Revised: 02/24/2020 Document Reviewed: 02/24/2020 Elsevier Patient Education  [place].

## 2022-04-12 NOTE — ED Provider Notes (Signed)
I provided a substantive portion of the care of this patient.  I personally performed the entirety of the medical decision making for this encounter.  EKG Interpretation  Date/Time:  Friday April 12 2022 18:26:41 EDT Ventricular Rate:  85 PR Interval:  191 QRS Duration: 119 QT Interval:  429 QTC Calculation: 511 R Axis:   -44 Text Interpretation: Sinus rhythm Ventricular premature complex Nonspecific IVCD with LAD Nonspecific T abnormalities, lateral leads Confirmed by Lacretia Leigh ([id number]) on 04/12/2022 10:22:34 PM    76 year old female presents after having an abnormal head CT.  A CT shows some edema.  MRI today confirms stroke.  Patient will be admitted for further work-up      Lacretia Leigh, MD 04/12/22 2223

## 2022-04-12 NOTE — ED Provider Notes (Signed)
[facility] DEPT Provider Note   CSN: [id number] Arrival date & time: 04/12/22  1711     History  Chief Complaint  Patient presents with   Abnormal CT     Chikita Dogan is a 76 y.o. female.  The history is provided by the patient and medical records. No language interpreter was used.     76 year old female significant history of diabetes, hypertension, hyperlipidemia sent here by her PCP with concerns of abnormal head CT scan.  Patient report 5 days ago she developed headache.  Headache is to the top of the head that radiates to the left side of her face.  She was having some trouble with her vision at that time.  Several days later she reached out and called her PCP who ordered a head CT scan which was performed last night.  Patient was notified today that the CT scan appears abnormal which shows swelling in her brain and patient should come to the ER for brain MRI.  Patient at this time denies have any active headache or vision changes.  She denies any trouble with her gait no confusion no focal numbness or focal weakness no neck pain.  She normally ambulate without difficulty and does not use any assistive device.  She denies having any nausea vomiting or diarrhea.  She denies alcohol or drug use.  No history of stroke.  Home Medications Prior to Admission medications   Medication Sig Start Date End Date Taking? Authorizing Provider  amLODipine (NORVASC) 10 MG tablet Take 10 mg by mouth daily.    [provider]  gabapentin (NEURONTIN) 100 MG capsule Take 100 mg by mouth 2 (two) times daily.    [provider]  glipiZIDE (GLUCOTROL) 10 MG tablet Take 10 mg by mouth 2 (two) times daily before a meal.    [provider]  hydrochlorothiazide (HYDRODIURIL) 25 MG tablet Take 25 mg by mouth daily. 04/03/20   [provider]  Linagliptin (TRADJENTA PO) Take 1 capsule by mouth daily.     [provider]  lisinopril  (PRINIVIL,ZESTRIL) 40 MG tablet Take 40 mg by mouth daily.    [provider]  magnesium oxide (MAG-OX) 400 MG tablet Take 400 mg by mouth daily.    [provider]  meloxicam (MOBIC) 15 MG tablet Take 15 mg by mouth daily as needed.     [provider]  metFORMIN (GLUCOPHAGE) 500 MG tablet Take by mouth 2 (two) times daily with a meal.    [provider]  metoprolol succinate (TOPROL-XL) 50 MG 24 hr tablet Take 50 mg by mouth 2 (two) times daily. Take with or immediately following a meal.    [provider]  Multiple Vitamin (MULTIVITAMIN) tablet Take 1 tablet by mouth daily.    [provider]  OZEMPIC, 0.25 OR 0.5 MG/DOSE, 2 MG/1.5ML SOPN Inject 0.5 mg into the skin once a week. 06/07/20   [provider]  pantoprazole (PROTONIX) 20 MG tablet Take 1 tablet (20 mg total) by mouth daily. 08/24/21   Pyrtle, Lajuan Lines, MD  pravastatin (PRAVACHOL) 20 MG tablet Take 20 mg by mouth daily.    [provider]  TRESIBA FLEXTOUCH 200 UNIT/ML SOPN Take 80 Units by mouth at bedtime.  10/12/18   [provider]      Allergies    Patient has no known allergies.    Review of Systems   Review of Systems  All other systems reviewed and are  negative.   Physical Exam Updated Vital Signs BP 135/83 (BP Location: Right Arm)   Pulse 95   Temp 99.3 F (37.4 C) (Oral)   Resp 15   SpO2 94%  Physical Exam Vitals and nursing note reviewed.  Constitutional:      General: She is not in acute distress.    Appearance: She is well-developed.  HENT:     Head: Atraumatic.  Eyes:     Conjunctiva/sclera: Conjunctivae normal.  Cardiovascular:     Rate and Rhythm: Normal rate and regular rhythm.     Pulses: Normal pulses.     Heart sounds: Normal heart sounds.  Pulmonary:     Effort: Pulmonary effort is normal.  Abdominal:     Palpations: Abdomen is soft.  Musculoskeletal:     Cervical back: Neck supple.  Skin:    Findings: No rash.   Neurological:     Mental Status: She is alert.     Comments: Neurologic exam:  Speech clear, pupils equal round reactive to light, extraocular movements intact  Peripheral visual field cut Cranial nerves III through XII normal including no facial droop Follows commands, moves all extremities x4, normal strength to bilateral upper and lower extremities at all major muscle groups including grip Sensation normal to light touch  Coordination intact, no limb ataxia No pronator drift Gait not tested   Psychiatric:        Mood and Affect: Mood normal.     ED Results / Procedures / Treatments   Labs (all labs ordered are listed, but only abnormal results are displayed) Labs Reviewed  COMPREHENSIVE METABOLIC PANEL - Abnormal; Notable for the following components:      Result Value   Glucose, Bld 137 (*)    All other components within normal limits  RESP PANEL BY RT-PCR (FLU A&B, COVID) ARPGX2  CBC WITH DIFFERENTIAL/PLATELET  URINALYSIS, ROUTINE W REFLEX MICROSCOPIC  ETHANOL  PROTIME-INR  APTT  RAPID URINE DRUG SCREEN, HOSP PERFORMED    EKG None ED ECG REPORT   Date: 04/12/2022  Rate: 85  Rhythm: normal sinus rhythm  QRS Axis: left  Intervals: QT prolonged  ST/T Wave abnormalities: nonspecific T wave changes  Conduction Disutrbances:nonspecific intraventricular conduction delay  Narrative Interpretation:   Old EKG Reviewed: unchanged  I have personally reviewed the EKG tracing and agree with the computerized printout as noted.   Radiology CT HEAD WO CONTRAST (5MM)  Result Date: 04/12/2022 CLINICAL DATA:  TIA (transient ischemic attack) EXAM: CT HEAD WITHOUT CONTRAST TECHNIQUE: Contiguous axial images were obtained from the base of the skull through the vertex without intravenous contrast. RADIATION DOSE REDUCTION: This exam was performed according to the departmental dose-optimization program which includes automated exposure control, adjustment of the mA and/or kV  according to patient size and/or use of iterative reconstruction technique. COMPARISON:  None Available. FINDINGS: Brain: Edema in the left occipital lobe. No evidence of acute hemorrhage. No definite evidence of mass lesion on this noncontrast head CT. No midline shift. No hydrocephalus. Mild additional patchy white matter hypoattenuation, nonspecific but compatible with chronic microvascular ischemic disease. Mild for age cerebral atrophy. Vascular: No hyperdense vessel identified. Skull: No acute fracture. Sinuses/Orbits: Clear sinuses.  No acute orbital findings. Other: No mastoid effusions. IMPRESSION: Edema in the left occipital lobe. This could potentially represent an acute or subacute left PCA territory infarct; however, the edema pattern is somewhat atypical (appearing more vasogenic than cytotoxic). Therefore, recommend MRI (preferably with contrast) to further evaluate and exclude other etiologies.  These results will be called to the ordering clinician or representative by the Radiologist Assistant, and communication documented in the PACS or [employer]. Electronically Signed   By: Margaretha Sheffield M.D.   On: 04/12/2022 16:07   VAS US CAROTID  Result Date: 04/11/2022 Carotid Arterial Duplex Study Patient Name:  RIHAM POLYAKOV  Date of Exam:   04/11/2022 Medical Rec #: [id number]         Accession #:    [id number] Date of Birth: 25-Jan-1946          Patient Gender: F Patient Age:   57 years Exam Location:  [facility] Procedure:      VAS US CAROTID Referring Phys: Arthur Holms --------------------------------------------------------------------------------  Indications:       TIA. Risk Factors:      Hypertension, hyperlipidemia, Diabetes. Limitations        Today's exam was limited due to the body habitus of the                    patient and the patient's respiratory variation. Comparison Study:  No prior studies. Performing Technologist: Oliver Hum RVT  Examination Guidelines: A  complete evaluation includes B-mode imaging, spectral Doppler, color Doppler, and power Doppler as needed of all accessible portions of each vessel. Bilateral testing is considered an integral part of a complete examination. Limited examinations for reoccurring indications may be performed as noted.  Right Carotid Findings: +----------+--------+--------+--------+-----------------------+--------+           PSV cm/sEDV cm/sStenosisPlaque Description     Comments +----------+--------+--------+--------+-----------------------+--------+ CCA Prox  61      12              smooth and heterogenous         +----------+--------+--------+--------+-----------------------+--------+ CCA Distal72      15              smooth and heterogenous         +----------+--------+--------+--------+-----------------------+--------+ ICA Prox  57      19              smooth and heterogenoustortuous +----------+--------+--------+--------+-----------------------+--------+ ICA Distal59      21                                     tortuous +----------+--------+--------+--------+-----------------------+--------+ ECA       53      13                                              +----------+--------+--------+--------+-----------------------+--------+ +----------+--------+-------+--------+-------------------+           PSV cm/sEDV cmsDescribeArm Pressure (mmHG) +----------+--------+-------+--------+-------------------+ [id number]                                         +----------+--------+-------+--------+-------------------+ +---------+--------+--+--------+--+---------+ VertebralPSV cm/s33EDV cm/s10Antegrade +---------+--------+--+--------+--+---------+  Left Carotid Findings: +----------+--------+--------+--------+-----------------------+--------+           PSV cm/sEDV cm/sStenosisPlaque Description     Comments  +----------+--------+--------+--------+-----------------------+--------+ CCA Prox  74      19              smooth and heterogenous         +----------+--------+--------+--------+-----------------------+--------+ CCA Distal51  16              smooth and heterogenous         +----------+--------+--------+--------+-----------------------+--------+ ICA Prox  51      17              smooth and heterogenous         +----------+--------+--------+--------+-----------------------+--------+ ICA Distal44      16                                     tortuous +----------+--------+--------+--------+-----------------------+--------+ ECA       35      5                                               +----------+--------+--------+--------+-----------------------+--------+ +----------+--------+--------+--------+-------------------+           PSV cm/sEDV cm/sDescribeArm Pressure (mmHG) +----------+--------+--------+--------+-------------------+ Subclavian140                                         +----------+--------+--------+--------+-------------------+ +---------+--------+--+--------+-+---------+ VertebralPSV cm/s21EDV cm/s9Antegrade +---------+--------+--+--------+-+---------+   Summary: Right Carotid: Velocities in the right ICA are consistent with a 1-39% stenosis. Left Carotid: Velocities in the left ICA are consistent with a 1-39% stenosis. Vertebrals: Bilateral vertebral arteries demonstrate antegrade flow. *See table(s) above for measurements and observations.  Electronically signed by Monica Martinez MD on 04/11/2022 at 4:12:06 PM.    Final     Procedures .Critical Care  Performed by: Domenic Moras, PA-C Authorized by: Domenic Moras, PA-C   Critical care provider statement:    Critical care time (minutes):  30   Critical care was time spent personally by me on the following activities:  Development of treatment plan with patient or surrogate, discussions with  consultants, evaluation of patient's response to treatment, examination of patient, ordering and review of laboratory studies, ordering and review of radiographic studies, ordering and performing treatments and interventions, pulse oximetry, re-evaluation of patient's condition and review of old charts     Medications Ordered in ED Medications  gadobutrol (GADAVIST) 1 MMOL/ML injection 8.5 mL (8.5 mLs Intravenous Contrast Given 04/12/22 2114)    ED Course/ Medical Decision Making/ A&P                           Medical Decision Making Amount and/or Complexity of Data Reviewed Labs: ordered.   BP 135/83 (BP Location: Right Arm)   Pulse 95   Temp 99.3 F (37.4 C) (Oral)   Resp 15   SpO2 94%   6:32 PM This is a 76 year old female sent here by her PCP with concerning finding on her head CT scan.  Patient report 5 days ago she developed pain to the top of her head that radiates to the left side of her face with some changes in her vision.  She felt warts were a bit blurred on her peripheral and having to shift her head to see it better.  Symptoms did resolved.  She did reach out to her PCP a few days later and had CT scan was ordered which showed some abnormal finding and therefore patient sent here for further evaluation.  At this time patient  is without any complaint.  No history of prior stroke.  She does have history of diabetes, neuropathy, hypertension and hyperlipidemia.  On exam this is a well-appearing female appears to be in no acute discomfort.  No focal neurodeficit noted on exam.  Vital sign stable.  Strength is equal throughout.  No appreciable visual field cut.  Recent head CT scan performed yesterday was independently visualized and inter by me and I agree with radiologist interpretation.  CT scan showing edema in the left occipital lobe which could potentially represent an acute or subacute left PCA territory infarct.  Recommend MRI for further assessment.  MRI have been  ordered.  Work-up initiated. Care discussed with Dr. Zenia Resides  9:55 PM Labs and EKG and imaging obtained independently viewed interpreted by me and agree with radiologist interpretation.  Patient has normal WBC, normal H&H, electrolyte panels are reassuring, EKG unchanged from prior.  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    Pt sign out to oncoming team who will consult neuro and medicine for admission  This patient presents to the ED for concern of headache, this involves an extensive number of treatment options, and is a complaint that carries with it a high risk of complications and morbidity.  The differential diagnosis includes subacute stroke, malignancy, complicated migraine, SAH, space occupying lesion  Co morbidities that complicate the patient evaluation DM  HTN  HLD Additional history obtained:  Additional history obtained from PCP External records from outside source obtained and reviewed including EMR including prior labs and imaging  Lab Tests:  I Ordered, and personally interpreted labs.  The pertinent results include:  as above  Imaging Studies ordered:  I ordered imaging studies including brain MRI I independently visualized and interpreted imaging which showed subacute left PCA stroke with petechial hemorrhage I agree with the radiologist interpretation  Cardiac Monitoring:  The patient was maintained on a cardiac monitor.  I personally viewed and interpreted the cardiac monitored which showed an underlying rhythm of: NSR  Medicines ordered and prescription drug management:    Test Considered: as above  Critical Interventions: brain MRI    Consultations Obtained:  I requested consultation with the neurology,  and discussed lab and imaging findings as well as pertinent plan - they recommend: admission  Problem List / ED Course: subacute L PCA stroke  Reevaluation:  After the interventions noted above, I  reevaluated the patient and found that they have :stayed the same  Social Determinants of Health: lack of transportation  Dispostion:  After consideration of the diagnostic results and the patients response to treatment, I feel that the patent would benefit from admission.         Final Clinical Impression(s) / ED Diagnoses Final diagnoses:  Acute left PCA stroke [facility])    Rx / DC Orders ED Discharge Orders     None         Domenic Moras, PA-C 04/12/22 2215    Lacretia Leigh, MD 04/15/22 [phone]

## 2022-04-13 ENCOUNTER — Observation Stay (HOSPITAL_BASED_OUTPATIENT_CLINIC_OR_DEPARTMENT_OTHER): Payer: Medicare Other

## 2022-04-13 ENCOUNTER — Encounter (HOSPITAL_COMMUNITY): Payer: Self-pay | Admitting: Internal Medicine

## 2022-04-13 ENCOUNTER — Observation Stay (HOSPITAL_COMMUNITY): Payer: Medicare Other

## 2022-04-13 DIAGNOSIS — Z20822 Contact with and (suspected) exposure to covid-19: Secondary | ICD-10-CM | POA: Diagnosis not present

## 2022-04-13 DIAGNOSIS — E1169 Type 2 diabetes mellitus with other specified complication: Secondary | ICD-10-CM | POA: Diagnosis not present

## 2022-04-13 DIAGNOSIS — Z79899 Other long term (current) drug therapy: Secondary | ICD-10-CM | POA: Diagnosis not present

## 2022-04-13 DIAGNOSIS — Z7984 Long term (current) use of oral hypoglycemic drugs: Secondary | ICD-10-CM | POA: Diagnosis not present

## 2022-04-13 DIAGNOSIS — I110 Hypertensive heart disease with heart failure: Secondary | ICD-10-CM | POA: Diagnosis not present

## 2022-04-13 DIAGNOSIS — I77810 Thoracic aortic ectasia: Secondary | ICD-10-CM | POA: Diagnosis not present

## 2022-04-13 DIAGNOSIS — E1165 Type 2 diabetes mellitus with hyperglycemia: Secondary | ICD-10-CM | POA: Diagnosis not present

## 2022-04-13 DIAGNOSIS — E785 Hyperlipidemia, unspecified: Secondary | ICD-10-CM | POA: Diagnosis not present

## 2022-04-13 DIAGNOSIS — I639 Cerebral infarction, unspecified: Secondary | ICD-10-CM

## 2022-04-13 DIAGNOSIS — H538 Other visual disturbances: Secondary | ICD-10-CM | POA: Diagnosis not present

## 2022-04-13 DIAGNOSIS — I493 Ventricular premature depolarization: Secondary | ICD-10-CM | POA: Diagnosis not present

## 2022-04-13 DIAGNOSIS — I6389 Other cerebral infarction: Secondary | ICD-10-CM

## 2022-04-13 DIAGNOSIS — Z791 Long term (current) use of non-steroidal anti-inflammatories (NSAID): Secondary | ICD-10-CM | POA: Diagnosis not present

## 2022-04-13 DIAGNOSIS — Z5982 Transportation insecurity: Secondary | ICD-10-CM | POA: Diagnosis not present

## 2022-04-13 DIAGNOSIS — Z8249 Family history of ischemic heart disease and other diseases of the circulatory system: Secondary | ICD-10-CM | POA: Diagnosis not present

## 2022-04-13 DIAGNOSIS — K219 Gastro-esophageal reflux disease without esophagitis: Secondary | ICD-10-CM | POA: Diagnosis not present

## 2022-04-13 DIAGNOSIS — R29702 NIHSS score 2: Secondary | ICD-10-CM | POA: Diagnosis not present

## 2022-04-13 DIAGNOSIS — Z808 Family history of malignant neoplasm of other organs or systems: Secondary | ICD-10-CM | POA: Diagnosis not present

## 2022-04-13 DIAGNOSIS — I2510 Atherosclerotic heart disease of native coronary artery without angina pectoris: Secondary | ICD-10-CM | POA: Diagnosis not present

## 2022-04-13 DIAGNOSIS — Z800 Family history of malignant neoplasm of digestive organs: Secondary | ICD-10-CM | POA: Diagnosis not present

## 2022-04-13 DIAGNOSIS — I5042 Chronic combined systolic (congestive) and diastolic (congestive) heart failure: Secondary | ICD-10-CM | POA: Diagnosis not present

## 2022-04-13 DIAGNOSIS — Q2112 Patent foramen ovale: Secondary | ICD-10-CM | POA: Diagnosis not present

## 2022-04-13 DIAGNOSIS — I63432 Cerebral infarction due to embolism of left posterior cerebral artery: Secondary | ICD-10-CM | POA: Diagnosis not present

## 2022-04-13 DIAGNOSIS — E1140 Type 2 diabetes mellitus with diabetic neuropathy, unspecified: Secondary | ICD-10-CM | POA: Diagnosis not present

## 2022-04-13 DIAGNOSIS — Z87891 Personal history of nicotine dependence: Secondary | ICD-10-CM | POA: Diagnosis not present

## 2022-04-13 DIAGNOSIS — I63532 Cerebral infarction due to unspecified occlusion or stenosis of left posterior cerebral artery: Secondary | ICD-10-CM | POA: Diagnosis present

## 2022-04-13 LAB — CBG MONITORING, ED
Glucose-Capillary: 108 mg/dL — ABNORMAL HIGH (ref 70–99)
Glucose-Capillary: 95 mg/dL (ref 70–99)
Glucose-Capillary: 126 mg/dL — ABNORMAL HIGH (ref 70–99)

## 2022-04-13 LAB — LIPID PANEL
Cholesterol: 121 mg/dL (ref 0–200)
HDL: 35 mg/dL — ABNORMAL LOW (ref >40)
LDL Cholesterol: 70 mg/dL (ref 0–99)
Total CHOL/HDL Ratio: 3.5 RATIO
Triglycerides: 82 mg/dL (ref <150)
VLDL: 16 mg/dL (ref 0–40)

## 2022-04-13 LAB — CBC
HCT: 42.6 % (ref 36.0–46.0)
HCT: 39.5 % (ref 36.0–46.0)
Hemoglobin: 13.6 g/dL (ref 12.0–15.0)
Hemoglobin: 12.7 g/dL (ref 12.0–15.0)
MCH: 27.2 pg (ref 26.0–34.0)
MCH: 27.3 pg (ref 26.0–34.0)
MCHC: 31.9 g/dL (ref 30.0–36.0)
MCHC: 32.2 g/dL (ref 30.0–36.0)
MCV: 85.2 fL (ref 80.0–100.0)
MCV: 84.9 fL (ref 80.0–100.0)
Platelets: 272 10*3/uL (ref 150–400)
Platelets: 285 10*3/uL (ref 150–400)
RBC: 5 MIL/uL (ref 3.87–5.11)
RBC: 4.65 MIL/uL (ref 3.87–5.11)
RDW: 14.1 % (ref 11.5–15.5)
RDW: 14.1 % (ref 11.5–15.5)
WBC: 6.4 10*3/uL (ref 4.0–10.5)
WBC: 5.9 10*3/uL (ref 4.0–10.5)
nRBC: 0 % (ref 0.0–0.2)
nRBC: 0 % (ref 0.0–0.2)

## 2022-04-13 LAB — BASIC METABOLIC PANEL
Anion gap: 7 (ref 5–15)
BUN: 12 mg/dL (ref 8–23)
CO2: 24 mmol/L (ref 22–32)
Calcium: 8.9 mg/dL (ref 8.9–10.3)
Chloride: 109 mmol/L (ref 98–111)
Creatinine, Ser: 0.72 mg/dL (ref 0.44–1.00)
GFR, Estimated: >60 mL/min (ref >60)
Glucose, Bld: 87 mg/dL (ref 70–99)
Potassium: 3.5 mmol/L (ref 3.5–5.1)
Sodium: 140 mmol/L (ref 135–145)

## 2022-04-13 LAB — ECHOCARDIOGRAM COMPLETE BUBBLE STUDY
AR max vel: 2.34 cm2
AV Area VTI: 2.12 cm2
AV Area mean vel: 2.19 cm2
AV Mean grad: 2 mmHg
AV Peak grad: 3.3 mmHg
Ao pk vel: 0.91 m/s
Area-P 1/2: 3.72 cm2
Calc EF: 21.5 %
S' Lateral: 4.8 cm
Single Plane A2C EF: 21.7 %
Single Plane A4C EF: 25.2 %

## 2022-04-13 LAB — HEMOGLOBIN A1C
Hgb A1c MFr Bld: 6.3 % — ABNORMAL HIGH (ref 4.8–5.6)
Mean Plasma Glucose: 134.11 mg/dL

## 2022-04-13 LAB — ETHANOL: Alcohol, Ethyl (B): <10 mg/dL (ref <10)

## 2022-04-13 LAB — GLUCOSE, CAPILLARY
Glucose-Capillary: 100 mg/dL — ABNORMAL HIGH (ref 70–99)
Glucose-Capillary: 187 mg/dL — ABNORMAL HIGH (ref 70–99)

## 2022-04-13 LAB — CREATININE, SERUM
Creatinine, Ser: 0.87 mg/dL (ref 0.44–1.00)
GFR, Estimated: >60 mL/min (ref >60)

## 2022-04-13 MED ORDER — ATORVASTATIN CALCIUM 80 MG PO TABS
80.0000 mg | ORAL_TABLET | Freq: Every day | ORAL | Status: DC
  Administered 2022-04-13 – 2022-04-17 (×5): 80 mg via ORAL
  Filled 2022-04-13: qty 1
  Filled 2022-04-13: qty 2
  Filled 2022-04-13 (×3): qty 1

## 2022-04-13 MED ORDER — INSULIN ASPART 100 UNIT/ML IJ SOLN
0.0000 [IU] | INTRAMUSCULAR | Status: DC
  Administered 2022-04-13: 2 [IU] via SUBCUTANEOUS
  Filled 2022-04-13: qty 0.09

## 2022-04-13 MED ORDER — INSULIN ASPART 100 UNIT/ML IJ SOLN
0.0000 [IU] | Freq: Three times a day (TID) | INTRAMUSCULAR | Status: DC
  Administered 2022-04-15 – 2022-04-16 (×2): 1 [IU] via SUBCUTANEOUS

## 2022-04-13 MED ORDER — MELATONIN 5 MG PO TABS: 5.0000 mg | ORAL_TABLET | Freq: Every evening | ORAL | Status: DC | PRN

## 2022-04-13 MED ORDER — ENOXAPARIN SODIUM 40 MG/0.4ML IJ SOSY
40.0000 mg | PREFILLED_SYRINGE | INTRAMUSCULAR | Status: DC
  Administered 2022-04-13 – 2022-04-15 (×3): 40 mg via SUBCUTANEOUS
  Filled 2022-04-13 (×3): qty 0.4

## 2022-04-13 MED ORDER — GABAPENTIN 100 MG PO CAPS
100.0000 mg | ORAL_CAPSULE | Freq: Two times a day (BID) | ORAL | Status: DC
  Administered 2022-04-13 – 2022-04-17 (×10): 100 mg via ORAL
  Filled 2022-04-13 (×10): qty 1

## 2022-04-13 MED ORDER — IOHEXOL 350 MG/ML SOLN
75.0000 mL | Freq: Once | INTRAVENOUS | Status: AC | PRN
  Administered 2022-04-13: 75 mL via INTRAVENOUS

## 2022-04-13 MED ORDER — METOPROLOL SUCCINATE ER 50 MG PO TB24
50.0000 mg | ORAL_TABLET | Freq: Two times a day (BID) | ORAL | Status: DC
  Administered 2022-04-13: 50 mg via ORAL
  Filled 2022-04-13: qty 1

## 2022-04-13 MED ORDER — DAPAGLIFLOZIN PROPANEDIOL 10 MG PO TABS
10.0000 mg | ORAL_TABLET | Freq: Every day | ORAL | Status: DC
  Administered 2022-04-13 – 2022-04-17 (×5): 10 mg via ORAL
  Filled 2022-04-13 (×5): qty 1

## 2022-04-13 MED ORDER — STROKE: EARLY STAGES OF RECOVERY BOOK
Freq: Once | Status: AC
  Filled 2022-04-13: qty 1

## 2022-04-13 MED ORDER — SPIRONOLACTONE 12.5 MG HALF TABLET
12.5000 mg | ORAL_TABLET | Freq: Every day | ORAL | Status: DC
  Administered 2022-04-13 – 2022-04-17 (×5): 12.5 mg via ORAL
  Filled 2022-04-13 (×6): qty 1

## 2022-04-13 MED ORDER — ASPIRIN 81 MG PO CHEW
81.0000 mg | CHEWABLE_TABLET | Freq: Every day | ORAL | Status: DC
  Administered 2022-04-14 – 2022-04-17 (×3): 81 mg via ORAL
  Filled 2022-04-13 (×3): qty 1

## 2022-04-13 MED ORDER — ACETAMINOPHEN 325 MG PO TABS
650.0000 mg | ORAL_TABLET | Freq: Four times a day (QID) | ORAL | Status: DC | PRN
  Administered 2022-04-16: 650 mg via ORAL
  Filled 2022-04-13: qty 2

## 2022-04-13 MED ORDER — PROCHLORPERAZINE EDISYLATE 10 MG/2ML IJ SOLN
10.0000 mg | Freq: Four times a day (QID) | INTRAMUSCULAR | Status: DC | PRN
Start: 2022-04-13 — End: 2022-04-17

## 2022-04-13 MED ORDER — PANTOPRAZOLE SODIUM 20 MG PO TBEC
20.0000 mg | DELAYED_RELEASE_TABLET | Freq: Every day | ORAL | Status: DC
  Administered 2022-04-13 – 2022-04-17 (×5): 20 mg via ORAL
  Filled 2022-04-13 (×5): qty 1

## 2022-04-13 MED ORDER — AMLODIPINE BESYLATE 5 MG PO TABS
10.0000 mg | ORAL_TABLET | Freq: Every day | ORAL | Status: DC
  Administered 2022-04-13: 10 mg via ORAL
  Filled 2022-04-13: qty 2

## 2022-04-13 MED ORDER — METOPROLOL TARTRATE 50 MG PO TABS
50.0000 mg | ORAL_TABLET | Freq: Two times a day (BID) | ORAL | Status: DC
  Administered 2022-04-13: 50 mg via ORAL
  Filled 2022-04-13: qty 2

## 2022-04-13 MED ORDER — PRAVASTATIN SODIUM 20 MG PO TABS: 20.0000 mg | ORAL_TABLET | Freq: Every day | ORAL | Status: DC

## 2022-04-13 NOTE — Progress Notes (Signed)
  Echocardiogram 2D Echocardiogram has been performed.  Merrie Roof F 04/13/2022, 3:31 PM

## 2022-04-13 NOTE — Consult Note (Signed)
Cardiology Consultation   Patient ID: Paula Wong MRN: [id number]; DOB: Dec 10, 1945  Admit date: 04/12/2022 Date of Consult: 04/13/2022  PCP:  Arthur Holms, NP   [place] [facility] Cardiologist: New to [facility]  Patient Profile:   Paula Wong is a 76 y.o. female with a hx of HFrEF (EF 20-25% by echo in 11/2021), HTN, HLD, Type 2 DM, prior tobacco use and family history of CAD who is being seen 04/13/2022 for the evaluation of cardiomyopathy at the request of Dr. Tawanna Solo.  History of Present Illness:   [name] presented to [facility] ED on 04/12/2022 as instructed by her PCP given her abnormal Head CT. She had previously informed her PCP she had developed a headache 5 days prior with associated blurred vision at that time. By review of records, this was read as showing edema in the left occipital lobe which may represent an acute or subacute left PCA territory infarct with Brain MRI recommended. This was obtained upon arrival and showed a subacute cortical infarct involving the inferior and medial left occipital lobe with petechial hemorrhage. Was noted noted to have moderate generalized atrophy and white matter disease along with soft disc protrusion at C4-5 displacing the cord posteriorly. Initial labs on admission showed WBC 5.8, Hgb 13.5, platelets 270, Na+ 142, K+ 3.8 and creatinine 0.94. UDS negative. Hgb A1c 6.3. FLP showed total cholesterol of 121, HDL 35, triglycerides 82 and LDL 70. EKG showed NSR, HR 85 with PVC's and IVCD with TWI along the lateral leads and no prior tracings available for comparison.   Given her MRI findings being consistent with a subacute CVA, she was transferred to [facility] for Neurology evaluation. Cardiology is consulted given her prior echocardiogram in 11/2021 showing a cardiomyopathy with EF at 20-25% with global HK and mildly reduced RV function. Did have mild MR and mild dilation of the ascending aorta at 38 mm. A prior phone mentioned  an outpatient consult after her PCP had ordered a stress echo and this was not felt to be appropriate given her cardiomyopathy and had therefore been canceled. The patient was unsure what she was taking at home but listed as being on Entresto 24-'26mg'$  BID (also listed as being on Lisinopril '40mg'$  daily), Farxiga '10mg'$  daily, HCTZ '25mg'$  daily, Amlodipine '10mg'$  daily, Lopressor '50mg'$  BID, Pravastatin '20mg'$  daily and Spironolactone 12.'5mg'$  daily.   In talking with the patient today, she reports she does not exercise routinely but stays active at home and has not experienced any recent chest pain, palpitations or dyspnea on exertion. No recent orthopnea or PND. Previously had issues with lower extremity edema but now resolved. She developed a headache this past Sunday and reports pain radiated down her face and improved with NSAIDS, therefore she did not seek evaluation at that time. Still having a mild headache but overall improved.    Past Medical History:  Diagnosis Date   Allergy    Diabetes ([place])    Diverticulosis    GERD (gastroesophageal reflux disease)    Hiatal hernia    Hyperlipidemia    Hypertension    Neuropathy    feet   Schatzki's ring     Past Surgical History:  Procedure Laterality Date   MASS EXCISION Right 06/07/2019   Procedure: EXCISIO OF KELOID TO RIGHT NECK;  Surgeon: Cristine Polio, MD;  Location: [facility];  Service: Plastics;  Laterality: Right;   TOOTH EXTRACTION  2013     Home Medications:  Prior to Admission  medications   Medication Sig Start Date End Date Taking? Authorizing Provider  amLODipine (NORVASC) 10 MG tablet Take 10 mg by mouth daily.   Yes [provider]  ENTRESTO 24-26 MG Take 1 tablet by mouth 2 (two) times daily. 03/18/22  Yes [provider]  FARXIGA 10 MG TABS tablet Take 10 mg by mouth daily. 03/14/22  Yes [provider]  gabapentin (NEURONTIN) 300 MG capsule Take by mouth. 01/25/22  Yes [provider]  lisinopril (PRINIVIL,ZESTRIL) 40 MG tablet Take 40 mg by mouth daily.   Yes [provider]  magnesium oxide (MAG-OX) 400 MG tablet Take 400 mg by mouth daily.   Yes [provider]  metFORMIN (GLUCOPHAGE-XR) 500 MG 24 hr tablet Take 500 mg by mouth 2 (two) times daily. 03/14/22  Yes [provider]  metoprolol tartrate (LOPRESSOR) 50 MG tablet Take 50 mg by mouth 2 (two) times daily. 03/02/22  Yes [provider]  OZEMPIC, 0.25 OR 0.5 MG/DOSE, 2 MG/1.5ML SOPN Inject 0.5 mg into the skin once a week. 06/07/20  Yes [provider]  pantoprazole (PROTONIX) 20 MG tablet Take 1 tablet (20 mg total) by mouth daily. 08/24/21  Yes Pyrtle, Lajuan Lines, MD  pravastatin (PRAVACHOL) 20 MG tablet Take 20 mg by mouth daily.   Yes [provider]  spironolactone (ALDACTONE) 25 MG tablet Take 12.5 mg by mouth daily. 03/18/22  Yes [provider]  TRESIBA FLEXTOUCH 200 UNIT/ML SOPN Take 80 Units by mouth at bedtime.  10/12/18  Yes [provider]  hydrochlorothiazide (HYDRODIURIL) 25 MG tablet Take 25 mg by mouth daily. Patient not taking: Reported on 04/12/2022 04/03/20   [provider]  Linagliptin (TRADJENTA PO) Take 1 capsule by mouth daily.  Patient not taking: Reported on 04/12/2022    [provider]  Multiple Vitamin (MULTIVITAMIN) tablet Take 1 tablet by mouth daily. Patient not taking: Reported on 04/12/2022    [provider]    Inpatient Medications: Scheduled Meds:  aspirin  325 mg Oral Daily   atorvastatin  80 mg Oral Daily   dapagliflozin propanediol  10 mg Oral Daily   enoxaparin (LOVENOX) injection  40 mg Subcutaneous Q24H   gabapentin  100 mg Oral BID   insulin aspart  0-9 Units Subcutaneous Q4H   metoprolol succinate  50 mg Oral BID   pantoprazole  20 mg Oral Daily   spironolactone  12.5 mg Oral Daily   Continuous Infusions:  PRN Meds: acetaminophen, melatonin,  prochlorperazine  Allergies:   No Known Allergies  Social History:   Social History   Socioeconomic History   Marital status: Single    Spouse name: Not on file   Number of children: Not on file   Years of education: Not on file   Highest education level: Not on file  Occupational History   Occupation: Retired  Tobacco Use   Smoking status: Former    Types: Cigarettes    Quit date: 08/20/2003    Years since quitting: 18.6   Smokeless tobacco: Never  Vaping Use   Vaping Use: Never used  Substance and Sexual Activity   Alcohol use: No   Drug use: No   Sexual activity: Not on file  Other Topics Concern   Not on file  Social History Narrative   Not on file   Social Determinants of Health   Financial Resource Strain: Not on file  Food Insecurity: Not on file  Transportation Needs: Unmet Transportation Needs (05/18/2019)  PRAPARE - Hydrologist (Medical): Yes    Lack of Transportation (Non-Medical): Yes  Physical Activity: Not on file  Stress: Not on file  Social Connections: Not on file  Intimate Partner Violence: Not At Risk (05/18/2019)   Humiliation, Afraid, Rape, and Kick questionnaire    Fear of Current or Ex-Partner: No    Emotionally Abused: No    Physically Abused: No    Sexually Abused: No    Family History:    Family History  Problem Relation Age of Onset   Colon cancer Mother 27   Heart attack Father    Cancer Sister        "female cancer"   Heart attack Brother    Stomach cancer Neg Hx    Pancreatic cancer Neg Hx    Esophageal cancer Neg Hx      ROS:  Please see the history of present illness.   All other ROS reviewed and negative.     Physical Exam/Data:   Vitals:   04/13/22 0830 04/13/22 1035 04/13/22 1121 04/13/22 1254  BP: 125/79  104/86 127/79  Pulse: (!) 31  90 87  Resp: '13  16 18  '$ Temp:  98.1 F (36.7 C) 98.2 F (36.8 C) 98.6 F (37 C)  TempSrc:  Oral Oral Oral  SpO2: 94%  96% 97%   No intake or  output data in the 24 hours ending 04/13/22 1402    08/24/2021    8:41 AM 06/27/2020    9:40 AM 01/19/2019    8:15 AM  Last 3 Weights  Weight (lbs) 190 lb 183 lb 187 lb  Weight (kg) 86.183 kg 83.008 kg 84.823 kg     There is no height or weight on file to calculate BMI.  General:  Well nourished, well developed female appearing in no acute distress HEENT: normal Neck: no JVD Vascular: No carotid bruits; Distal pulses 2+ bilaterally Cardiac:  normal S1, S2; RRR with occasional ectopic beats.  Lungs:  clear to auscultation bilaterally, no wheezing, rhonchi or rales  Abd: soft, nontender, no hepatomegaly  Ext: no edema Musculoskeletal:  No deformities, BUE and BLE strength normal and equal Skin: warm and dry  Neuro:  CNs 2-12 intact, no focal abnormalities noted Psych:  Normal affect   EKG:  The EKG was personally reviewed and demonstrates: NSR, HR 85 with PVC's and IVCD with TWI along the lateral leads and no prior tracings available for comparison.  Telemetry:  Telemetry was personally reviewed and demonstrates: NSR, HR in 80's to 90's with occasional PVC's.   Relevant CV Studies:  Echocardiogram: 12/04/2021 IMPRESSIONS     1. Left ventricular ejection fraction, by estimation, is 20 to 25%. The  left ventricle has severely decreased function. The left ventricle  demonstrates global hypokinesis. There is mild left ventricular  hypertrophy. Left ventricular diastolic parameters   are indeterminate.   2. Right ventricular systolic function is mildly reduced. The right  ventricular size is normal.   3. The mitral valve is normal in structure. Mild mitral valve  regurgitation. No evidence of mitral stenosis.   4. The aortic valve is tricuspid. Aortic valve regurgitation is trivial.  Aortic valve sclerosis is present, with no evidence of aortic valve  stenosis.   5. Aortic dilatation noted. There is mild dilatation of the ascending  aorta, measuring 38 mm.   Laboratory  Data:  High Sensitivity Troponin:  No results for input(s): "TROPONINIHS" in the last 720 hours.  Chemistry Recent Labs  Lab 04/12/22 1745 04/13/22 0036 04/13/22 0530  NA 142  --  140  K 3.8  --  3.5  CL 108  --  109  CO2 24  --  24  GLUCOSE 137*  --  87  BUN 16  --  12  CREATININE 0.94 0.87 0.72  CALCIUM 9.8  --  8.9  GFRNONAA >60 >60 >60  ANIONGAP 10  --  7    Recent Labs  Lab 04/12/22 1745  PROT 7.8  ALBUMIN 4.3  AST 21  ALT 15  ALKPHOS 41  BILITOT 0.9   Lipids  Recent Labs  Lab 04/13/22 0500  CHOL 121  TRIG 82  HDL 35*  LDLCALC 70  CHOLHDL 3.5    Hematology Recent Labs  Lab 04/12/22 1745 04/13/22 0036 04/13/22 0530  WBC 5.8 6.4 5.9  RBC 4.87 5.00 4.65  HGB 13.5 13.6 12.7  HCT 41.0 42.6 39.5  MCV 84.2 85.2 84.9  MCH 27.7 27.2 27.3  MCHC 32.9 31.9 32.2  RDW 13.9 14.1 14.1  PLT 270 272 285   Thyroid No results for input(s): "TSH", "FREET4" in the last 168 hours.  BNPNo results for input(s): "BNP", "PROBNP" in the last 168 hours.  DDimer No results for input(s): "DDIMER" in the last 168 hours.   Radiology/Studies:  CT ANGIO HEAD NECK W WO CM  Result Date: 04/13/2022 CLINICAL DATA:  Hemorrhagic stroke EXAM: CT ANGIOGRAPHY HEAD AND NECK TECHNIQUE: Multidetector CT imaging of the head and neck was performed using the standard protocol during bolus administration of intravenous contrast. Multiplanar CT image reconstructions and MIPs were obtained to evaluate the vascular anatomy. Carotid stenosis measurements (when applicable) are obtained utilizing NASCET criteria, using the distal internal carotid diameter as the denominator. RADIATION DOSE REDUCTION: This exam was performed according to the departmental dose-optimization program which includes automated exposure control, adjustment of the mA and/or kV according to patient size and/or use of iterative reconstruction technique. CONTRAST:  31m OMNIPAQUE IOHEXOL 350 MG/ML SOLN COMPARISON:  None Available.  FINDINGS: CTA NECK FINDINGS SKELETON: There is no bony spinal canal stenosis. No lytic or blastic lesion. OTHER NECK: Normal pharynx, larynx and major salivary glands. No cervical lymphadenopathy. Unremarkable thyroid gland. UPPER CHEST: No pneumothorax or pleural effusion. No nodules or masses. AORTIC ARCH: There is calcific atherosclerosis of the aortic arch. There is no aneurysm, dissection or hemodynamically significant stenosis of the visualized portion of the aorta. Conventional 3 vessel aortic branching pattern. The visualized proximal subclavian arteries are widely patent. RIGHT CAROTID SYSTEM: Normal without aneurysm, dissection or stenosis. LEFT CAROTID SYSTEM: No dissection, occlusion or aneurysm. There is mixed density atherosclerosis extending into the proximal ICA, resulting in less than 50% stenosis. VERTEBRAL ARTERIES: Codominant configuration. Both origins are clearly patent. There is no dissection, occlusion or flow-limiting stenosis to the skull base (V1-V3 segments). CTA HEAD FINDINGS POSTERIOR CIRCULATION: --Vertebral arteries: Normal V4 segments. --Inferior cerebellar arteries: Normal. --Basilar artery: Normal. --Superior cerebellar arteries: Normal. --Posterior cerebral arteries (PCA): Normal. ANTERIOR CIRCULATION: --Intracranial internal carotid arteries: Atherosclerotic calcification of the internal carotid arteries at the skull base without hemodynamically significant stenosis. --Anterior cerebral arteries (ACA): Normal. Both A1 segments are present. Patent anterior communicating artery (a-comm). --Middle cerebral arteries (MCA): Normal. VENOUS SINUSES: As permitted by contrast timing, patent. ANATOMIC VARIANTS: None Review of the MIP images confirms the above findings. IMPRESSION: 1. No emergent large vessel occlusion or hemodynamically significant stenosis of the head or neck. 2. Aortic Atherosclerosis (ICD10-I70.0). Electronically Signed  By: Ulyses Jarred M.D.   On: 04/13/2022 03:10    MR BRAIN W WO CONTRAST  Result Date: 04/12/2022 CLINICAL DATA:  Intermittent headache and blurred vision 5 days. Abnormal head CT. EXAM: MRI HEAD WITHOUT AND WITH CONTRAST TECHNIQUE: Multiplanar, multiecho pulse sequences of the brain and surrounding structures were obtained without and with intravenous contrast. CONTRAST:  8.70m GADAVIST GADOBUTROL 1 MMOL/ML IV SOLN COMPARISON:  CT head without contrast 04/11/2022 FINDINGS: Brain: Diffusion-weighted images demonstrate restricted cortical diffusion in the inferior and medial left occipital pole. T2 and FLAIR hyperintensities are associated. Susceptibility is compatible with petechial hemorrhage. Moderate generalized atrophy and scattered white matter changes are otherwise present. No other acute infarct is present. T2 hyperintensities in the thalami bilaterally are consistent with remote ischemic change. Brainstem and cerebellum are within normal limits. The internal auditory canals are within normal limits. The ventricles are proportionate to the degree of atrophy. No significant extraaxial fluid collection is present. Vascular: Flow is present in the major intracranial arteries. Skull and upper cervical spine: A prominent soft disc protrusion at C4-5 displaces the cord posteriorly. Craniocervical junction is normal. Marrow signal is within normal limits. Sinuses/Orbits: The paranasal sinuses and mastoid air cells are clear. The globes and orbits are within normal limits. IMPRESSION: 1. 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 2. Moderate generalized atrophy and white matter disease likely reflects the sequela of chronic microvascular ischemia. 3. Prominent soft disc protrusion at C4-5 displaces the cord posteriorly. Consider MRI of the cervical spine without contrast if the patient is symptomatic. These results were called by telephone at the time of interpretation on 04/12/2022 at 10:00 pm to  [facility] , who verbally acknowledged these results. Electronically Signed   By: CSan MorelleM.D.   On: 04/12/2022 22:02   CT HEAD WO CONTRAST (5MM)  Result Date: 04/12/2022 CLINICAL DATA:  TIA (transient ischemic attack) EXAM: CT HEAD WITHOUT CONTRAST TECHNIQUE: Contiguous axial images were obtained from the base of the skull through the vertex without intravenous contrast. RADIATION DOSE REDUCTION: This exam was performed according to the departmental dose-optimization program which includes automated exposure control, adjustment of the mA and/or kV according to patient size and/or use of iterative reconstruction technique. COMPARISON:  None Available. FINDINGS: Brain: Edema in the left occipital lobe. No evidence of acute hemorrhage. No definite evidence of mass lesion on this noncontrast head CT. No midline shift. No hydrocephalus. Mild additional patchy white matter hypoattenuation, nonspecific but compatible with chronic microvascular ischemic disease. Mild for age cerebral atrophy. Vascular: No hyperdense vessel identified. Skull: No acute fracture. Sinuses/Orbits: Clear sinuses.  No acute orbital findings. Other: No mastoid effusions. IMPRESSION: Edema in the left occipital lobe. This could potentially represent an acute or subacute left PCA territory infarct; however, the edema pattern is somewhat atypical (appearing more vasogenic than cytotoxic). Therefore, recommend MRI (preferably with contrast) to further evaluate and exclude other etiologies. These results will be called to the ordering clinician or representative by the Radiologist Assistant, and communication documented in the PACS or [employer] Electronically Signed   By: FMargaretha SheffieldM.D.   On: 04/12/2022 16:07   VAS [place]  Result Date: 04/11/2022 Carotid Arterial Duplex Study Patient Name:  EMICHEALE SCHLACK Date of Exam:   04/11/2022 Medical Rec #: [id number]        Accession #:    [id number]  of Birth: [birth year]  Patient Gender: F Patient Age:   81 years Exam Location:  [facility] Procedure:      VAS US CAROTID Referring Phys: Arthur Holms --------------------------------------------------------------------------------  Indications:       TIA. Risk Factors:      Hypertension, hyperlipidemia, Diabetes. Limitations        Today's exam was limited due to the body habitus of the                    patient and the patient's respiratory variation. Comparison Study:  No prior studies. Performing Technologist: Oliver Hum RVT  Examination Guidelines: A complete evaluation includes B-mode imaging, spectral Doppler, color Doppler, and power Doppler as needed of all accessible portions of each vessel. Bilateral testing is considered an integral part of a complete examination. Limited examinations for reoccurring indications may be performed as noted.  Right Carotid Findings: +----------+--------+--------+--------+-----------------------+--------+           PSV cm/sEDV cm/sStenosisPlaque Description     Comments +----------+--------+--------+--------+-----------------------+--------+ CCA Prox  61      12              smooth and heterogenous         +----------+--------+--------+--------+-----------------------+--------+ CCA Distal72      15              smooth and heterogenous         +----------+--------+--------+--------+-----------------------+--------+ ICA Prox  57      19              smooth and heterogenoustortuous +----------+--------+--------+--------+-----------------------+--------+ ICA Distal59      21                                     tortuous +----------+--------+--------+--------+-----------------------+--------+ ECA       53      13                                              +----------+--------+--------+--------+-----------------------+--------+ +----------+--------+-------+--------+-------------------+           PSV cm/sEDV  cmsDescribeArm Pressure (mmHG) +----------+--------+-------+--------+-------------------+ [id number]                                         +----------+--------+-------+--------+-------------------+ +---------+--------+--+--------+--+---------+ VertebralPSV cm/s33EDV cm/s10Antegrade +---------+--------+--+--------+--+---------+  Left Carotid Findings: +----------+--------+--------+--------+-----------------------+--------+           PSV cm/sEDV cm/sStenosisPlaque Description     Comments +----------+--------+--------+--------+-----------------------+--------+ CCA Prox  74      19              smooth and heterogenous         +----------+--------+--------+--------+-----------------------+--------+ CCA Distal51      16              smooth and heterogenous         +----------+--------+--------+--------+-----------------------+--------+ ICA Prox  51      17              smooth and heterogenous         +----------+--------+--------+--------+-----------------------+--------+ ICA Distal44      16  tortuous +----------+--------+--------+--------+-----------------------+--------+ ECA       35      5                                               +----------+--------+--------+--------+-----------------------+--------+ +----------+--------+--------+--------+-------------------+           PSV cm/sEDV cm/sDescribeArm Pressure (mmHG) +----------+--------+--------+--------+-------------------+ Subclavian140                                         +----------+--------+--------+--------+-------------------+ +---------+--------+--+--------+-+---------+ VertebralPSV cm/s21EDV cm/s9Antegrade +---------+--------+--+--------+-+---------+   Summary: Right Carotid: Velocities in the right ICA are consistent with a 1-39% stenosis. Left Carotid: Velocities in the left ICA are consistent with a 1-39% stenosis. Vertebrals: Bilateral  vertebral arteries demonstrate antegrade flow. *See table(s) above for measurements and observations.  Electronically signed by Monica Martinez MD on 04/11/2022 at 4:12:06 PM.    Final      Assessment and Plan:   1. HFrEF - Prior echo in 11/2021 showed an EF of 20-25% and ischemic evaluation was not pursued at that time but medical therapy has been adjusted by her PCP in the interim.  - She denies any recent orthopnea, PND or edema and appears euvolemic on examination today. Repeat echocardiogram is pending. She was previously scheduled for a stress echo per her PCP but this is not ideal given her cardiomyopathy and she reports she would not be able to walk on a treadmill. If her EF remains reduced, can obtain a Coronary CTA on Monday.  - She was on Entresto 24-'26mg'$  BID (also listed as being on Lisinopril '40mg'$  daily but confirmed with the patient she was not taking this), Farxiga '10mg'$  daily, HCTZ '25mg'$  daily, Amlodipine '10mg'$  daily, Lopressor '50mg'$  BID and Spironolactone 12.'5mg'$  daily. Will continue Spironolactone and switch Lopressor to Toprol-XL '50mg'$  BID which can be consolidated to once daily dosing prior to discharge. Will restart [place]. Unless Neurology is allowing for permissive HTN, would recommend restarting Entresto 24-'26mg'$  BID.   2. HTN - BP has been variable from 123/83 - 153/102 since admission. Continue Spironolactone and will switch Lopressor to Toprol-XL. As above, would restart Entresto if cleared to do so by Neurology. HCTZ and Amlodipine currently held.   3. HLD - FLP shows total cholesterol of 121, HDL 35, triglycerides 82 and LDL 70. She was on Pravastatin prior to admission and this has been switched to Atorvastatin '80mg'$  daily.   4. PVC's - Noted on telemetry but no episodes of NSVT. Will adjust Lopressor to Toprol-XL as outlined above.  5. Subacute CVA - Brain MRI this admission showed a subacute cortical infarct involving the inferior and medial left occipital lobe with  petechial hemorrhage. Stroke Team has been consulted by the admitting team.    Risk Assessment/Risk Scores:        New York Heart Association (NYHA) Functional Class NYHA Class I   For questions or updates, please contact [place] Please consult [email or web address] for contact info under    Signed, Erma Heritage, PA-C  04/13/2022 2:02 PM

## 2022-04-13 NOTE — H&P (Addendum)
History and Physical  Paula Wong JYN:[id number] DOB: September 24, 1945 DOA: 04/12/2022  Referring physician: Spero Curb  PCP: Paula Holms, NP  Outpatient Specialists: Cardiology Patient coming from: Home.  Chief Complaint: Abnormal CT head.  HPI: Paula Wong is a 76 y.o. 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  Endorses having a moderate to severe headache for the past 5 days.  No nausea or vomiting.  Due to her persistent symptoms, she reached out to her PCP who ordered a head CT scan.  She was notified on the day of presentation of abnormal findings and to present to the ED for a follow-up brain MRI.  In the ED, work-up revealed subacute cortical infarct involving the inferior medial left occipital pole with petechial hemorrhage.  EDP discussed the case with neurology/stroke team who recommended admission at [facility] for stroke work-up.  The patient was admitted by [facility], hospitalist service.  ED Course: Tmax 99.3.  BP 153/102, pulse 86, respiratory 18, saturation 98% on room air.  Review of Systems: Review of systems as noted in the HPI. All other systems reviewed and are negative.   Past Medical History:  Diagnosis Date   Allergy    Diabetes ([place])    Diverticulosis    GERD (gastroesophageal reflux disease)    Hiatal hernia    Hyperlipidemia    Hypertension    Neuropathy    feet   Schatzki's ring    Past Surgical History:  Procedure Laterality Date   MASS EXCISION Right 06/07/2019   Procedure: EXCISIO OF KELOID TO RIGHT NECK;  Surgeon: Cristine Polio, MD;  Location: [facility];  Service: Plastics;  Laterality: Right;   TOOTH EXTRACTION  2013    Social History:  reports that she quit smoking about 18 years ago. Her smoking use included cigarettes. She has never used smokeless tobacco. She reports  that she does not drink alcohol and does not use drugs.   No Known Allergies  Family History  Problem Relation Age of Onset   Colon cancer Mother 48   Cancer Sister        "female cancer"   Stomach cancer Neg Hx    Pancreatic cancer Neg Hx    Esophageal cancer Neg Hx       Prior to Admission medications   Medication Sig Start Date End Date Taking? Authorizing Provider  amLODipine (NORVASC) 10 MG tablet Take 10 mg by mouth daily.   Yes [provider]  ENTRESTO 24-26 MG Take 1 tablet by mouth 2 (two) times daily. 03/18/22  Yes [provider]  FARXIGA 10 MG TABS tablet Take 10 mg by mouth daily. 03/14/22  Yes [provider]  gabapentin (NEURONTIN) 300 MG capsule Take by mouth. 01/25/22  Yes [provider]  lisinopril (PRINIVIL,ZESTRIL) 40 MG tablet Take 40 mg by mouth daily.   Yes [provider]  magnesium oxide (MAG-OX) 400 MG tablet Take 400 mg by mouth daily.   Yes [provider]  metFORMIN (GLUCOPHAGE-XR) 500 MG 24 hr tablet Take 500 mg by mouth 2 (two) times daily. 03/14/22  Yes [provider]  metoprolol tartrate (LOPRESSOR) 50 MG tablet Take 50 mg by mouth 2 (two) times daily. 03/02/22  Yes [provider]  OZEMPIC, 0.25 OR 0.5 MG/DOSE, 2 MG/1.5ML SOPN Inject 0.5 mg into the skin once a week. 06/07/20  Yes [provider]  pantoprazole (PROTONIX) 20 MG tablet Take 1 tablet (20 mg total) by mouth daily. 08/24/21  Yes Pyrtle, Lajuan Lines, MD  pravastatin (PRAVACHOL) 20 MG tablet Take 20 mg by mouth daily.   Yes [provider]  spironolactone (ALDACTONE) 25 MG tablet Take 12.5 mg by mouth daily. 03/18/22  Yes [provider]  TRESIBA FLEXTOUCH 200 UNIT/ML SOPN Take 80 Units by mouth at bedtime.  10/12/18  Yes [provider]  hydrochlorothiazide (HYDRODIURIL) 25 MG tablet Take 25 mg by mouth daily. Patient not taking: Reported on 04/12/2022 04/03/20   [provider]   Linagliptin (TRADJENTA PO) Take 1 capsule by mouth daily.  Patient not taking: Reported on 04/12/2022    [provider]  Multiple Vitamin (MULTIVITAMIN) tablet Take 1 tablet by mouth daily. Patient not taking: Reported on 04/12/2022    [provider]    Physical Exam: BP 135/80   Pulse 86   Temp 98.2 F (36.8 C) (Oral)   Resp 20   SpO2 96%   General: 76 y.o. year-old female well developed well nourished in no acute distress.  Alert and oriented x3. Cardiovascular: Regular rate and rhythm with no rubs or gallops.  No thyromegaly or JVD noted.  No lower extremity edema. 2/4 pulses in all 4 extremities. Respiratory: Clear to auscultation with no wheezes or rales. Good inspiratory effort. Abdomen: Soft nontender nondistended with normal bowel sounds x4 quadrants. Muskuloskeletal: No cyanosis, clubbing or edema noted bilaterally Neuro: CN II-XII intact, strength, sensation, reflexes Skin: No ulcerative lesions noted or rashes Psychiatry: Judgement and insight appear normal. Mood is appropriate for condition and setting          Labs on Admission:  Basic Metabolic Panel: Recent Labs  Lab 04/12/22 1745  NA 142  K 3.8  CL 108  CO2 24  GLUCOSE 137*  BUN 16  CREATININE 0.94  CALCIUM 9.8   Liver Function Tests: Recent Labs  Lab 04/12/22 1745  AST 21  ALT 15  ALKPHOS 41  BILITOT 0.9  PROT 7.8  ALBUMIN 4.3   No results for input(s): "LIPASE", "AMYLASE" in the last 168 hours. No results for input(s): "AMMONIA" in the last 168 hours. CBC: Recent Labs  Lab 04/12/22 1745  WBC 5.8  NEUTROABS 2.9  HGB 13.5  HCT 41.0  MCV 84.2  PLT 270   Cardiac Enzymes: No results for input(s): "CKTOTAL", "CKMB", "CKMBINDEX", "TROPONINI" in the last 168 hours.  BNP (last 3 results) No results for input(s): "BNP" in the last 8760 hours.  ProBNP (last 3 results) No results for input(s): "PROBNP" in the last 8760 hours.  CBG: No results for input(s): "GLUCAP" in  the last 168 hours.  Radiological Exams on Admission: MR BRAIN W WO CONTRAST  Result Date: 04/12/2022 CLINICAL DATA:  Intermittent headache and blurred vision 5 days. Abnormal head CT. EXAM: MRI HEAD WITHOUT AND WITH CONTRAST TECHNIQUE: Multiplanar, multiecho pulse sequences of the brain and surrounding structures were obtained without and with intravenous contrast. CONTRAST:  8.61m GADAVIST GADOBUTROL 1 MMOL/ML IV SOLN COMPARISON:  CT head without contrast 04/11/2022 FINDINGS: Brain: Diffusion-weighted images demonstrate restricted cortical diffusion in the inferior and medial left occipital pole. T2 and FLAIR hyperintensities are associated. Susceptibility is compatible with petechial hemorrhage. Moderate generalized atrophy and scattered white matter changes are otherwise present. No other acute infarct is present. T2 hyperintensities in the thalami bilaterally are consistent with remote ischemic change. Brainstem and cerebellum are within normal limits. The  internal auditory canals are within normal limits. The ventricles are proportionate to the degree of atrophy. No significant extraaxial fluid collection is present. Vascular: Flow is present in the major intracranial arteries. Skull and upper cervical spine: A prominent soft disc protrusion at C4-5 displaces the cord posteriorly. Craniocervical junction is normal. Marrow signal is within normal limits. Sinuses/Orbits: The paranasal sinuses and mastoid air cells are clear. The globes and orbits are within normal limits. IMPRESSION: 1. 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 2. Moderate generalized atrophy and white matter disease likely reflects the sequela of chronic microvascular ischemia. 3. Prominent soft disc protrusion at C4-5 displaces the cord posteriorly. Consider MRI of the cervical spine without contrast if the patient is symptomatic. These results were called by telephone at  the time of interpretation on 04/12/2022 at 10:00 pm to provider [facility] , who verbally acknowledged these results. Electronically Signed   By: San Morelle M.D.   On: 04/12/2022 22:02   CT HEAD WO CONTRAST (5MM)  Result Date: 04/12/2022 CLINICAL DATA:  TIA (transient ischemic attack) EXAM: CT HEAD WITHOUT CONTRAST TECHNIQUE: Contiguous axial images were obtained from the base of the skull through the vertex without intravenous contrast. RADIATION DOSE REDUCTION: This exam was performed according to the departmental dose-optimization program which includes automated exposure control, adjustment of the mA and/or kV according to patient size and/or use of iterative reconstruction technique. COMPARISON:  None Available. FINDINGS: Brain: Edema in the left occipital lobe. No evidence of acute hemorrhage. No definite evidence of mass lesion on this noncontrast head CT. No midline shift. No hydrocephalus. Mild additional patchy white matter hypoattenuation, nonspecific but compatible with chronic microvascular ischemic disease. Mild for age cerebral atrophy. Vascular: No hyperdense vessel identified. Skull: No acute fracture. Sinuses/Orbits: Clear sinuses.  No acute orbital findings. Other: No mastoid effusions. IMPRESSION: Edema in the left occipital lobe. This could potentially represent an acute or subacute left PCA territory infarct; however, the edema pattern is somewhat atypical (appearing more vasogenic than cytotoxic). Therefore, recommend MRI (preferably with contrast) to further evaluate and exclude other etiologies. These results will be called to the ordering clinician or representative by the Radiologist Assistant, and communication documented in the PACS or [employer]. Electronically Signed   By: Margaretha Sheffield M.D.   On: 04/12/2022 16:07   VAS US CAROTID  Result Date: 04/11/2022 Carotid Arterial Duplex Study Patient Name:  Paula Wong  Date of Exam:   04/11/2022 Medical Rec  #: [id number]         Accession #:    [id number] Date of Birth: April 22, 1946          Patient Gender: F Patient Age:   76 years Exam Location:  [facility] Procedure:      VAS US CAROTID Referring Phys: Paula Wong --------------------------------------------------------------------------------  Indications:       TIA. Risk Factors:      Hypertension, hyperlipidemia, Diabetes. Limitations        Today's exam was limited due to the body habitus of the                    patient and the patient's respiratory variation. Comparison Study:  No prior studies. Performing Technologist: Oliver Hum RVT  Examination Guidelines: A complete evaluation includes B-mode imaging, spectral Doppler, color Doppler, and power Doppler as needed of all accessible portions of each vessel. Bilateral testing is considered an integral part of a complete examination.  Limited examinations for reoccurring indications may be performed as noted.  Right Carotid Findings: +----------+--------+--------+--------+-----------------------+--------+           PSV cm/sEDV cm/sStenosisPlaque Description     Comments +----------+--------+--------+--------+-----------------------+--------+ CCA Prox  61      12              smooth and heterogenous         +----------+--------+--------+--------+-----------------------+--------+ CCA Distal72      15              smooth and heterogenous         +----------+--------+--------+--------+-----------------------+--------+ ICA Prox  57      19              smooth and heterogenoustortuous +----------+--------+--------+--------+-----------------------+--------+ ICA Distal59      21                                     tortuous +----------+--------+--------+--------+-----------------------+--------+ ECA       53      13                                              +----------+--------+--------+--------+-----------------------+--------+  +----------+--------+-------+--------+-------------------+           PSV cm/sEDV cmsDescribeArm Pressure (mmHG) +----------+--------+-------+--------+-------------------+ [id number]                                         +----------+--------+-------+--------+-------------------+ +---------+--------+--+--------+--+---------+ VertebralPSV cm/s33EDV cm/s10Antegrade +---------+--------+--+--------+--+---------+  Left Carotid Findings: +----------+--------+--------+--------+-----------------------+--------+           PSV cm/sEDV cm/sStenosisPlaque Description     Comments +----------+--------+--------+--------+-----------------------+--------+ CCA Prox  74      19              smooth and heterogenous         +----------+--------+--------+--------+-----------------------+--------+ CCA Distal51      16              smooth and heterogenous         +----------+--------+--------+--------+-----------------------+--------+ ICA Prox  51      17              smooth and heterogenous         +----------+--------+--------+--------+-----------------------+--------+ ICA Distal44      16                                     tortuous +----------+--------+--------+--------+-----------------------+--------+ ECA       35      5                                               +----------+--------+--------+--------+-----------------------+--------+ +----------+--------+--------+--------+-------------------+           PSV cm/sEDV cm/sDescribeArm Pressure (mmHG) +----------+--------+--------+--------+-------------------+ Subclavian140                                         +----------+--------+--------+--------+-------------------+ +---------+--------+--+--------+-+---------+ VertebralPSV cm/s21EDV cm/s9Antegrade +---------+--------+--+--------+-+---------+  Summary: Right Carotid: Velocities in the right ICA are consistent with a 1-39% stenosis. Left Carotid:  Velocities in the left ICA are consistent with a 1-39% stenosis. Vertebrals: Bilateral vertebral arteries demonstrate antegrade flow. *See table(s) above for measurements and observations.  Electronically signed by Monica Martinez MD on 04/11/2022 at 4:12:06 PM.    Final     EKG: I independently viewed the EKG done and my findings are as followed: Sinus rhythm rate of 85.  Nonspecific ST-T changes.  QTc 511.  Assessment/Plan Present on Admission:  Acute CVA (cerebrovascular accident) ([place])  Principal Problem:   Acute CVA (cerebrovascular accident) ([place])  Subacute CVA MRI brain revealed subacute cortical infarct involving the inferior medial left occipital pole with petechial hemorrhage. No LVO on CTA head and neck. Follow 2D echo with bubble study, fasting lipid panel, and hemoglobin A1c. PT/OT/speech therapist assessment Fall and aspiration precautions in place Neurology/stroke team consulted by EDP. Currently on Lipitor 80 mg daily and aspirin 325 mg daily. Monitor on telemetry.  Uncontrolled hypertension BP is not at goal, elevated Resume home oral antihypertensives Closely monitor vital signs  Type 2 diabetes with hyperglycemia Obtain hemoglobin A1c Start insulin sliding scale.  HFrEF 20 to 25% Resume home cardiac medications Start strict I's and O's and daily weight  Prolonged QTc Optimize magnesium and potassium levels Avoid QTc prolonging agents  Headache Symptomatic management   DVT prophylaxis: Subcu Lovenox daily  Code Status: Full code  Family Communication: None at bedside.  Disposition Plan: Admitted to telemetry medical unit.  Consults called: Neurology/stroke team consulted by EDP.  Admission status: Observation status.   Status is: Observation    Kayleen Memos MD [facility] Pager [phone]  If 7PM-7AM, please contact night-coverage [email or web address] Password [facility]  04/13/2022, 12:34 AM

## 2022-04-13 NOTE — Progress Notes (Signed)
Brief  same day note:  Patient is a 76 year old female with history of type 2 diabetes, hypertension, hyperlipidemia, chronic systolic heart failure who presented here as per her PCP due to abnormal CT head finding.  She was having severe headache since last few days.CT head showed edema in the left occipital lobe suspicious for acute or subacute left PCA territory infarct.MRI brain done here showed subacute cortical infarct involving the inferior and medial left occipital pole with petechial hemorrhage.  Neurology consulted.  Stroke work-up initiated.Being transported to [facility] today . Patient seen and examined at the bedside in the emergency department.  During my evaluation she was hemodynamically stable without any complaints.  She does not have any focal neurological deficits and she is completely alert and oriented.  Assessment and plan:  Subacute ischemic CVA: Has been having headache for last several days.CT head done as an outpatient showed edema in the left occipital lobe suspicious for acute or subacute left PCA territory infarct.MRI brain done here showed subacute cortical infarct involving the inferior and medial left occipital pole with petechial hemorrhage.  No LVO on CTA head and neck.  Neurology consulted.   A1c of 6.3.  LDL of 70.  Pending echo.  Pending PT/OT/speech evaluation.  Currently on Lipitor 80 mg daily and aspirin 325 mg daily  Uncontrolled hypertension: Resumed antihypertensives.  No necessity of permissive  hypertension due to subacute findings.  Will discontinue amlodipine due to severe reduction in the ejection fraction.  Type 2 diabetes: A1c of 6.3.  Currently on sliding scale  insulin.  Monitor blood sugars.  On Ozempic, Tresiba, metformin ,farxiga at home  Chronic systolic congestive heart failure: Echocardiogram done on 4/23 showed EF of 20 to 25%, global hypokinesis.  Surprisingly, patient does not follow with cardiology and she does not know about this finding.  She  appears euvolemic and currently not in acute heart failure. We will consult cardiology here for possible ischemic work-up if needed. Sent secure chat to Dr Quentin Ore. She is supposed to have a stress test next week as scheduled by PCP.  Home meds show  spironolactone, metoprolol,lisinopril,entresto.  Patient not sure what medications she takes.  Need proper home reconciliation  Prolonged QT: Qtc of 511 .We will monitor  Headache: Continue symptomatic management.resolved now

## 2022-04-13 NOTE — Consult Note (Addendum)
Neurology Consultation  Reason for Consult: stroke on MRI  Referring Physician: Dr. Louanne Belton  CC: abnormal CT brain   History is obtained from:medical record and patient  HPI: Paula Wong is a 76 y.o. female with past medical history of DM, HTN, HLD, HFrEF who presented to [facility] ED per the recommendation from her PCP for an abnormal outpatient CT head.  Patient states on Sunday she awoke at 0400 with a headache on the top of her head and then pain ran down the left side of her face. She then called her PCP on Monday and ordered a CT head that was scheduled for Thursday. On Friday evening she received a call from her PCP recommending her to go to the ED for further evaluation. Ct head with edema in the left occipital lobe concern for Left PCA infarct.  She denies any numbness, tingling, weakness, slurred speech or facial droop. Neurology consulted for stroke workup  LKW:  8/20 IV thrombolysis given?: no, outside window  Premorbid modified Rankin scale (mRS):  0-Completely asymptomatic and back to baseline post-stroke  ROS: Full ROS was performed and is negative except as noted in the HPI.    Past Medical History:  Diagnosis Date   Allergy    Diabetes ([place])    Diverticulosis    GERD (gastroesophageal reflux disease)    Hiatal hernia    Hyperlipidemia    Hypertension    Neuropathy    feet   Schatzki's ring      Family History  Problem Relation Age of Onset   Colon cancer Mother 15   Heart attack Father    Cancer Sister        "female cancer"   Heart attack Brother    Stomach cancer Neg Hx    Pancreatic cancer Neg Hx    Esophageal cancer Neg Hx      Social History:   reports that she quit smoking about 18 years ago. Her smoking use included cigarettes. She has never used smokeless tobacco. She reports that she does not drink alcohol and does not use drugs.  Medications  Current Facility-Administered Medications:    acetaminophen (TYLENOL) tablet 650 mg, 650 mg,  Oral, Q6H PRN, Hall, Carole N, DO   aspirin tablet 325 mg, 325 mg, Oral, Daily, Campas, Abigail, PA-C, 325 mg at 04/13/22 0941   atorvastatin (LIPITOR) tablet 80 mg, 80 mg, Oral, Daily, Irene Pap N, DO, 80 mg at 04/13/22 1055   dapagliflozin propanediol (FARXIGA) tablet 10 mg, 10 mg, Oral, Daily, Strader, Brittany M, PA-C   enoxaparin (LOVENOX) injection 40 mg, 40 mg, Subcutaneous, Q24H, Hall, Carole N, DO, 40 mg at 04/13/22 0941   gabapentin (NEURONTIN) capsule 100 mg, 100 mg, Oral, BID, Hall, Carole N, DO, 100 mg at 04/13/22 1055   insulin aspart (novoLOG) injection 0-9 Units, 0-9 Units, Subcutaneous, Q4H, Hall, Carole N, DO   melatonin tablet 5 mg, 5 mg, Oral, QHS PRN, [place] Crane, Carole N, DO   metoprolol succinate (TOPROL-XL) 24 hr tablet 50 mg, 50 mg, Oral, BID, Strader, Brittany M, PA-C   pantoprazole (PROTONIX) EC tablet 20 mg, 20 mg, Oral, Daily, Hall, Carole N, DO, 20 mg at 04/13/22 0941   prochlorperazine (COMPAZINE) injection 10 mg, 10 mg, Intravenous, Q6H PRN, [place] Crane, Carole N, DO   spironolactone (ALDACTONE) tablet 12.5 mg, 12.5 mg, Oral, Daily, Hall, Carole N, DO, 12.5 mg at 04/13/22 1055   Exam: Current vital signs: BP 127/79 (BP Location: Right Arm)   Pulse 87  Temp 98.6 F (37 C) (Oral)   Resp 18   SpO2 97%  Vital signs in last 24 hours: Temp:  [97.7 F (36.5 C)-99.3 F (37.4 C)] 98.6 F (37 C) (08/26 1254) Pulse Rate:  [31-95] 87 (08/26 1254) Resp:  [11-22] 18 (08/26 1254) BP: (104-153)/(69-102) 127/79 (08/26 1254) SpO2:  [92 %-100 %] 97 % (08/26 1254)  GENERAL: Awake, alert in NAD HEENT: - Normocephalic and atraumatic, dry mm, LUNGS - Clear to auscultation bilaterally with no wheezes CV - S1S2 RRR, no m/r/g, equal pulses bilaterally. ABDOMEN - Soft, nontender, nondistended with normoactive BS Ext: warm, well perfused, intact peripheral pulses, no edema  NEURO:  Mental Status: AA&Ox4 Language: speech is clear.  Naming, repetition, fluency, and comprehension  intact. Cranial Nerves: PERRL, EOMI, visual fields full, no facial asymmetry, facial sensation intact, hearing intact, tongue/uvula/soft palate midline, normal sternocleidomastoid and trapezius muscle strength. No evidence of tongue atrophy or fibrillations Motor: 5/5 in all 4 extremities Tone: is normal and bulk is normal Sensation- Intact to light touch bilaterally Coordination: FTN intact bilaterally, no ataxia in BLE. Gait- deferred  NIHSS 1a Level of Conscious.: 0 1b LOC Questions: 0 1c LOC Commands: 0 2 Best Gaze: 0 3 Visual: 0 4 Facial Palsy: 0 5a Motor Arm - left: 0 5b Motor Arm - Right: 0 6a Motor Leg - Left: 0 6b Motor Leg - Right: 0 7 Limb Ataxia: 0 8 Sensory: 0 9 Best Language: 0 10 Dysarthria: 0 11 Extinct. and Inatten.: 0 TOTAL: 0   Labs I have reviewed labs in epic and the results pertinent to this consultation are:  CBC    Component Value Date/Time   WBC 5.9 04/13/2022 0530   RBC 4.65 04/13/2022 0530   HGB 12.7 04/13/2022 0530   HCT 39.5 04/13/2022 0530   PLT 285 04/13/2022 0530   MCV 84.9 04/13/2022 0530   MCH 27.3 04/13/2022 0530   MCHC 32.2 04/13/2022 0530   RDW 14.1 04/13/2022 0530   LYMPHSABS 2.1 04/12/2022 1745   MONOABS 0.6 04/12/2022 1745   EOSABS 0.1 04/12/2022 1745   BASOSABS 0.0 04/12/2022 1745    CMP     Component Value Date/Time   NA 140 04/13/2022 0530   K 3.5 04/13/2022 0530   CL 109 04/13/2022 0530   CO2 24 04/13/2022 0530   GLUCOSE 87 04/13/2022 0530   BUN 12 04/13/2022 0530   CREATININE 0.72 04/13/2022 0530   CREATININE 0.83 10/02/2020 1100   CALCIUM 8.9 04/13/2022 0530   PROT 7.8 04/12/2022 1745   ALBUMIN 4.3 04/12/2022 1745   AST 21 04/12/2022 1745   ALT 15 04/12/2022 1745   ALKPHOS 41 04/12/2022 1745   BILITOT 0.9 04/12/2022 1745   GFRNONAA >60 04/13/2022 0530   GFRNONAA 69 10/02/2020 1100   GFRAA 80 10/02/2020 1100    Lipid Panel     Component Value Date/Time   CHOL 121 04/13/2022 0500   TRIG 82  04/13/2022 0500   HDL 35 (L) 04/13/2022 0500   CHOLHDL 3.5 04/13/2022 0500   VLDL 16 04/13/2022 0500   LDLCALC 70 04/13/2022 0500   LDLCALC 90 10/02/2020 1100   Last Echo EF 20-25%  Imaging I have reviewed the images obtained:  CT-head Edema in the left occipital lobe. This could potentially represent an acute or subacute left PCA territory infarct; however, the edema pattern is somewhat atypical (appearing more vasogenic than cytotoxic).   CTA head/neck  1. No emergent large vessel occlusion or hemodynamically significant stenosis of  the head or neck. 2. Aortic Atherosclerosis   MRI examination of the brain 1. 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 2. Moderate generalized atrophy and white matter disease likely reflects the sequela of chronic microvascular ischemia. 3. Prominent soft disc protrusion at C4-5 displaces the cord posteriorly  LABS:  LDL-c 70 A1c 6.3  Assessment:  Merci Walthers is a 76 y.o. female with past medical history of DM, HTN, HLD, HFrEF who presented to [facility] ED per the recommendation from her PCP for an abnormal outpatient CT head. Exam with NIH 0 right now. MRI with subacute cortical infarction in the left occipital pole with mild petechial hemorrhage. Likely cardioembolic etiology.   Impression: Subacute left inferior and medial left occipital pole with petechial hemorrhage  Recommendations: - Frequent neuro checks - Echocardiogram - ( being done now) - Prophylactic therapy-Antiplatelet med: Aspirin - dose '81mg'$   - Risk factor modification - Telemetry monitoring - PT consult, OT consult, Speech consult - Stroke team to follow   Beulah Gandy DNP, [place]   Attending Neurohospitalist Addendum Patient seen and examined with APP/Resident. Agree with the history and physical as documented above. Agree with the plan as documented, which I helped formulate. I have independently  reviewed the chart, obtained history, review of systems and examined the patient.I have personally reviewed pertinent head/neck/spine imaging (CT/MRI). Please feel free to call with any questions.  -- Amie [place], MD Neurologist [facility] Pager: [phone]

## 2022-04-14 ENCOUNTER — Observation Stay (HOSPITAL_BASED_OUTPATIENT_CLINIC_OR_DEPARTMENT_OTHER): Payer: Medicare Other

## 2022-04-14 DIAGNOSIS — E1169 Type 2 diabetes mellitus with other specified complication: Secondary | ICD-10-CM | POA: Diagnosis not present

## 2022-04-14 DIAGNOSIS — I639 Cerebral infarction, unspecified: Secondary | ICD-10-CM | POA: Diagnosis not present

## 2022-04-14 DIAGNOSIS — I5022 Chronic systolic (congestive) heart failure: Secondary | ICD-10-CM

## 2022-04-14 DIAGNOSIS — I10 Essential (primary) hypertension: Secondary | ICD-10-CM

## 2022-04-14 DIAGNOSIS — E785 Hyperlipidemia, unspecified: Secondary | ICD-10-CM | POA: Diagnosis not present

## 2022-04-14 DIAGNOSIS — E119 Type 2 diabetes mellitus without complications: Secondary | ICD-10-CM

## 2022-04-14 DIAGNOSIS — I159 Secondary hypertension, unspecified: Secondary | ICD-10-CM

## 2022-04-14 LAB — BASIC METABOLIC PANEL
Anion gap: 11 (ref 5–15)
BUN: 9 mg/dL (ref 8–23)
CO2: 24 mmol/L (ref 22–32)
Calcium: 9.3 mg/dL (ref 8.9–10.3)
Chloride: 107 mmol/L (ref 98–111)
Creatinine, Ser: 0.87 mg/dL (ref 0.44–1.00)
GFR, Estimated: >60 mL/min (ref >60)
Glucose, Bld: 85 mg/dL (ref 70–99)
Potassium: 3.6 mmol/L (ref 3.5–5.1)
Sodium: 142 mmol/L (ref 135–145)

## 2022-04-14 LAB — CBC
HCT: 38.5 % (ref 36.0–46.0)
Hemoglobin: 12.6 g/dL (ref 12.0–15.0)
MCH: 27.3 pg (ref 26.0–34.0)
MCHC: 32.7 g/dL (ref 30.0–36.0)
MCV: 83.3 fL (ref 80.0–100.0)
Platelets: 250 10*3/uL (ref 150–400)
RBC: 4.62 MIL/uL (ref 3.87–5.11)
RDW: 13.7 % (ref 11.5–15.5)
WBC: 4.9 10*3/uL (ref 4.0–10.5)
nRBC: 0 % (ref 0.0–0.2)

## 2022-04-14 LAB — GLUCOSE, CAPILLARY
Glucose-Capillary: 82 mg/dL (ref 70–99)
Glucose-Capillary: 102 mg/dL — ABNORMAL HIGH (ref 70–99)
Glucose-Capillary: 109 mg/dL — ABNORMAL HIGH (ref 70–99)
Glucose-Capillary: 103 mg/dL — ABNORMAL HIGH (ref 70–99)

## 2022-04-14 LAB — MAGNESIUM: Magnesium: 1.9 mg/dL (ref 1.7–2.4)

## 2022-04-14 MED ORDER — POTASSIUM CHLORIDE CRYS ER 20 MEQ PO TBCR
20.0000 meq | EXTENDED_RELEASE_TABLET | Freq: Once | ORAL | Status: AC
  Administered 2022-04-14: 20 meq via ORAL
  Filled 2022-04-14: qty 1

## 2022-04-14 MED ORDER — MAGNESIUM OXIDE -MG SUPPLEMENT 400 (240 MG) MG PO TABS
400.0000 mg | ORAL_TABLET | Freq: Two times a day (BID) | ORAL | Status: DC
  Administered 2022-04-14 – 2022-04-17 (×6): 400 mg via ORAL
  Filled 2022-04-14 (×6): qty 1

## 2022-04-14 MED ORDER — METOPROLOL SUCCINATE ER 50 MG PO TB24
75.0000 mg | ORAL_TABLET | Freq: Two times a day (BID) | ORAL | Status: DC
  Administered 2022-04-14 – 2022-04-17 (×6): 75 mg via ORAL
  Filled 2022-04-14 (×6): qty 1

## 2022-04-14 MED ORDER — CLOPIDOGREL BISULFATE 75 MG PO TABS
75.0000 mg | ORAL_TABLET | Freq: Every day | ORAL | Status: DC
  Administered 2022-04-14 – 2022-04-17 (×4): 75 mg via ORAL
  Filled 2022-04-14 (×4): qty 1

## 2022-04-14 NOTE — Evaluation (Signed)
Occupational Therapy Evaluation Patient Details Name: Paula Wong MRN: [id number] DOB: 10/27/1945 Today's Date: 04/14/2022   History of Present Illness Pt is a 76 y/o F presenting to ED on 8/25 from PCP office for abnormal CT. MRI revealing subacute cortical infarct involving inferior and medial L occipital pole with petechial hemorrhage. PMH includes DM, diverticulosis, GERD, HLD, HTN, and neuropathy.   Clinical Impression   Pt independent at baseline with ADLs and functional mobility, lives in senior living apartment community on ground floor, has friends/family that can assist PRN. Pt uses public transportation, does not drive. Pt currently completing ADLs and mobility with mod I, presents with impaired vision at time of evaluation. Pt with R visual field deficit, unable to read the ends of words, compensates by turning head further to R side. Pt educated on [employer], verbalized understanding . Pt presenting with impairments listed below, will follow acutely. Recommend OP OT at d/c.      Recommendations for follow up therapy are one component of a multi-disciplinary discharge planning process, led by the attending physician.  Recommendations may be updated based on patient status, additional functional criteria and insurance authorization.   Follow Up Recommendations  Outpatient OT    Assistance Recommended at Discharge Set up Supervision/Assistance  Patient can return home with the following Assist for transportation;Direct supervision/assist for medications management;Direct supervision/assist for financial management;Assistance with cooking/housework    Functional Status Assessment  Patient has had a recent decline in their functional status and demonstrates the ability to make significant improvements in function in a reasonable and predictable amount of time.  Equipment Recommendations  None recommended by OT    Recommendations for Other Services       Precautions /  Restrictions Precautions Precautions: None Restrictions Weight Bearing Restrictions: No      Mobility Bed Mobility               General bed mobility comments: sitting EOB upon arrival    Transfers Overall transfer level: Modified independent Equipment used: None                      Balance Overall balance assessment: Mild deficits observed, not formally tested                                         ADL either performed or assessed with clinical judgement   ADL Overall ADL's : Modified independent                                       General ADL Comments: pt completing LB/UB dressing upon arrival, completes simulated toilet transfer with mod I     Vision Baseline Vision/History: 1 Wears glasses Ability to See in Adequate Light: 1 Impaired Patient Visual Report: Peripheral vision impairment Vision Assessment?: Yes Eye Alignment: Within Functional Limits Ocular Range of Motion: Within Functional Limits Alignment/Gaze Preference: Within Defined Limits Tracking/Visual Pursuits: Able to track stimulus in all quads without difficulty Saccades: Within functional limits Visual Fields: Right visual field deficit Additional Comments: difficulty reading ends of words, compensates by turning head to further to R side to read     Perception     Praxis      Pertinent Vitals/Pain Pain Assessment Pain Assessment: No/denies pain     Hand Dominance Right  Extremity/Trunk Assessment Upper Extremity Assessment Upper Extremity Assessment: Overall WFL for tasks assessed   Lower Extremity Assessment Lower Extremity Assessment: Overall WFL for tasks assessed   Cervical / Trunk Assessment Cervical / Trunk Assessment: Normal   Communication Communication Communication: No difficulties   Cognition Arousal/Alertness: Awake/alert Behavior During Therapy: WFL for tasks assessed/performed Overall Cognitive Status: No  family/caregiver present to determine baseline cognitive functioning                                 General Comments: appearing WFL, A& Ox4, decreased short term memory when adminstering SBT, decreased attention with dual-tasking.     General Comments  VSS on RA    Exercises     Shoulder Instructions      Home Living Family/patient expects to be discharged to:: Private residence (Senior living community) Living Arrangements: Alone Available Help at Discharge: Family;Friend(s);Available PRN/intermittently Type of Home: Apartment Home Access: Level entry     Home Layout: One level     Bathroom Shower/Tub: Tub/shower unit         Home Equipment: Cane - single point          Prior Functioning/Environment Prior Level of Function : Independent/Modified Independent             Mobility Comments: no AD use ADLs Comments: ind; takes bus for transportation        OT Problem List: Decreased cognition;Impaired vision/perception;Decreased activity tolerance;Impaired balance (sitting and/or standing)      OT Treatment/Interventions: Self-care/ADL training;Therapeutic exercise;Energy conservation;Therapeutic activities;Visual/perceptual remediation/compensation;Patient/family education;Balance training;Cognitive remediation/compensation;DME and/or AE instruction    OT Goals(Current goals can be found in the care plan section) Acute Rehab OT Goals Patient Stated Goal: to go home OT Goal Formulation: With patient Time For Goal Achievement: 04/28/22 Potential to Achieve Goals: Good ADL Goals Additional ADL Goal #1: pt will receive passing score on pillbox assessment in order to promote ind with med mgmt Additional ADL Goal #2: pt will verbalize 2 low vision strategies in order to safely perform ADLs/mobility at home  OT Frequency: Min 2X/week    Co-evaluation              AM-PAC OT "6 Clicks" Daily Activity     Outcome Measure Help from another  person eating meals?: None Help from another person taking care of personal grooming?: None Help from another person toileting, which includes using toliet, bedpan, or urinal?: None Help from another person bathing (including washing, rinsing, drying)?: A Little Help from another person to put on and taking off regular upper body clothing?: None Help from another person to put on and taking off regular lower body clothing?: None 6 Click Score: 23   End of Session Nurse Communication: Mobility status  Activity Tolerance: Patient tolerated treatment well Patient left: in bed;with call bell/phone within reach (sitting EOB)  OT Visit Diagnosis: Unsteadiness on feet (R26.81);Muscle weakness (generalized) (M62.81);Low vision, both eyes (H54.2);Other symptoms and signs involving cognitive function                Time: 1950-9326 OT Time Calculation (min): 18 min Charges:  OT General Charges $OT Visit: 1 Visit OT Evaluation $OT Eval Moderate Complexity: 1 [address] OTD, OTR/L Acute Rehab [id number]) 832 - [place] 04/14/2022, 8:19 AM

## 2022-04-14 NOTE — Evaluation (Addendum)
Speech Language Pathology Evaluation Patient Details Name: Paula Wong MRN: [id number] DOB: 06-10-1946 Today's Date: 04/14/2022 Time: 8841-6606 SLP Time Calculation (min) (ACUTE ONLY): 10 min  Problem List:  Patient Active Problem List   Diagnosis Date Noted   Hypertension    Diabetes ([place])    Hyperlipidemia    Chronic systolic CHF (congestive heart failure) ([place])    Acute CVA (cerebrovascular accident) ([place]) 04/12/2022   Keloid 05/18/2019   Family history of colon cancer in mother 05/05/2014   GERD (gastroesophageal reflux disease) 05/05/2014   Past Medical History:  Past Medical History:  Diagnosis Date   Allergy    Diabetes ([place])    Diverticulosis    GERD (gastroesophageal reflux disease)    Hiatal hernia    Hyperlipidemia    Hypertension    Neuropathy    feet   Schatzki's ring    Past Surgical History:  Past Surgical History:  Procedure Laterality Date   MASS EXCISION Right 06/07/2019   Procedure: EXCISIO OF KELOID TO RIGHT NECK;  Surgeon: Cristine Polio, MD;  Location: [place];  Service: Plastics;  Laterality: Right;   TOOTH EXTRACTION  2013   HPI:  Pt is a 76 y/o F presenting to ED on 8/25 from PCP office for abnormal CT. MRI revealing subacute cortical infarct involving inferior and medial L occipital pole with petechial hemorrhage. PMH includes DM, diverticulosis, GERD, HLD, HTN, and neuropathy.   Assessment / Plan / Recommendation Clinical Impression  Patient is not presenting with any cognitive-linguistic or speech deficits as per this brief evaluation. She demonstrated excellent recall of recent medical interventions and recommendations and reports being motivated to focus on her ongoing stroke recovery at home. SLP spent some time discussing information from Stroke booklet that patient has already received here at the hospital. She also mentioned that MD reviewed and showed her the MRI films. We discussed how stroke symptoms present  differently depending on location of the infarct and patient encouraged to continue reading materials on Stroke and stroke prevention. No concern for patient safety as she appears to be at baseline and independent with her cognitive skills. SLP swallow evaluation ordered but patient screened by SLP and no further testing required as she appears to be without dysphagia.    SLP Assessment  SLP Recommendation/Assessment: Patient does not need any further Speech [facility] SLP Visit Diagnosis: Cognitive communication deficit (R41.841)    Recommendations for follow up therapy are one component of a multi-disciplinary discharge planning process, led by the attending physician.  Recommendations may be updated based on patient status, additional functional criteria and insurance authorization.    Follow Up Recommendations  No SLP follow up    Assistance Recommended at Discharge  None  Functional Status Assessment Patient has not had a recent decline in their functional status  Frequency and Duration     N/A      SLP Evaluation Cognition  Overall Cognitive Status: Within Functional Limits for tasks assessed       Comprehension  Auditory Comprehension Overall Auditory Comprehension: Appears within functional limits for tasks assessed    Expression Expression Primary Mode of Expression: Verbal Verbal Expression Overall Verbal Expression: Appears within functional limits for tasks assessed   Oral / Motor  Oral Motor/Sensory Function Overall Oral Motor/Sensory Function: Within functional limits Motor Speech Overall Motor Speech: Appears within functional limits for tasks assessed            Sonia Baller, MA, CCC-SLP Speech Therapy

## 2022-04-14 NOTE — Progress Notes (Addendum)
Rounding Note    Patient Name: Paula Wong Date of Encounter: 04/14/2022  [facility] Cardiologist: New to [facility] this admission  Subjective   Breathing at baseline. No chest pain. Ambulated with OT this morning and reports that went well.    Inpatient Medications    Scheduled Meds:   stroke: early stages of recovery book   Does not apply Once   aspirin  81 mg Oral Daily   atorvastatin  80 mg Oral Daily   dapagliflozin propanediol  10 mg Oral Daily   enoxaparin (LOVENOX) injection  40 mg Subcutaneous Q24H   gabapentin  100 mg Oral BID   insulin aspart  0-9 Units Subcutaneous TID AC & HS   metoprolol succinate  50 mg Oral BID   pantoprazole  20 mg Oral Daily   spironolactone  12.5 mg Oral Daily   Continuous Infusions:  PRN Meds: acetaminophen, melatonin, prochlorperazine   Vital Signs    Vitals:   04/13/22 2352 04/14/22 0416 04/14/22 0500 04/14/22 0745  BP: 121/70 118/80  120/69  Pulse: 92 85  86  Resp: '18 18  18  '$ Temp: 97.8 F (36.6 C) 98 F (36.7 C)  98.1 F (36.7 C)  TempSrc: Oral Oral  Oral  SpO2: 96% 97%  98%  Weight:   88.3 kg    No intake or output data in the 24 hours ending 04/14/22 0753    04/14/2022    5:00 AM 08/24/2021    8:41 AM 06/27/2020    9:40 AM  Last 3 Weights  Weight (lbs) 194 lb 10.7 oz 190 lb 183 lb  Weight (kg) 88.3 kg 86.183 kg 83.008 kg      Telemetry    NSR, HR in 80's to 90's. Occasional PAC's and PVC's. Up to 4 beats NSVT.  - Personally Reviewed  ECG    NSR, HR 76 with PAC's, LAD and IVCD. Slight ST depression along lateral leads.  - Personally Reviewed  Physical Exam   GEN: Pleasant female appearing in no acute distress.   Neck: No JVD Cardiac: RRR with occasional ectopic beats, no murmurs, rubs, or gallops.  Respiratory: Clear to auscultation bilaterally. GI: Soft, nontender, non-distended  MS: Trace ankle edema bilaterally; No deformity. Neuro:  Nonfocal  Psych: Normal affect   Labs    High  Sensitivity Troponin:  No results for input(s): "TROPONINIHS" in the last 720 hours.   Chemistry Recent Labs  Lab 04/12/22 1745 04/13/22 0036 04/13/22 0530 04/14/22 0215  NA 142  --  140 142  K 3.8  --  3.5 3.6  CL 108  --  109 107  CO2 24  --  24 24  GLUCOSE 137*  --  87 85  BUN 16  --  12 9  CREATININE 0.94 0.87 0.72 0.87  CALCIUM 9.8  --  8.9 9.3  PROT 7.8  --   --   --   ALBUMIN 4.3  --   --   --   AST 21  --   --   --   ALT 15  --   --   --   ALKPHOS 41  --   --   --   BILITOT 0.9  --   --   --   GFRNONAA >60 >60 >60 >60  ANIONGAP 10  --  7 11    Lipids  Recent Labs  Lab 04/13/22 0500  CHOL 121  TRIG 82  HDL 35*  LDLCALC 70  CHOLHDL 3.5    Hematology Recent Labs  Lab 04/13/22 0036 04/13/22 0530 04/14/22 0215  WBC 6.4 5.9 4.9  RBC 5.00 4.65 4.62  HGB 13.6 12.7 12.6  HCT 42.6 39.5 38.5  MCV 85.2 84.9 83.3  MCH 27.2 27.3 27.3  MCHC 31.9 32.2 32.7  RDW 14.1 14.1 13.7  PLT 272 285 250   Thyroid No results for input(s): "TSH", "FREET4" in the last 168 hours.  BNPNo results for input(s): "BNP", "PROBNP" in the last 168 hours.  DDimer No results for input(s): "DDIMER" in the last 168 hours.   Radiology     CT ANGIO HEAD NECK W WO CM  Result Date: 04/13/2022 CLINICAL DATA:  Hemorrhagic stroke EXAM: CT ANGIOGRAPHY HEAD AND NECK TECHNIQUE: Multidetector CT imaging of the head and neck was performed using the standard protocol during bolus administration of intravenous contrast. Multiplanar CT image reconstructions and MIPs were obtained to evaluate the vascular anatomy. Carotid stenosis measurements (when applicable) are obtained utilizing NASCET criteria, using the distal internal carotid diameter as the denominator. RADIATION DOSE REDUCTION: This exam was performed according to the departmental dose-optimization program which includes automated exposure control, adjustment of the mA and/or kV according to patient size and/or use of iterative reconstruction  technique. CONTRAST:  55m OMNIPAQUE IOHEXOL 350 MG/ML SOLN COMPARISON:  None Available. FINDINGS: CTA NECK FINDINGS SKELETON: There is no bony spinal canal stenosis. No lytic or blastic lesion. OTHER NECK: Normal pharynx, larynx and major salivary glands. No cervical lymphadenopathy. Unremarkable thyroid gland. UPPER CHEST: No pneumothorax or pleural effusion. No nodules or masses. AORTIC ARCH: There is calcific atherosclerosis of the aortic arch. There is no aneurysm, dissection or hemodynamically significant stenosis of the visualized portion of the aorta. Conventional 3 vessel aortic branching pattern. The visualized proximal subclavian arteries are widely patent. RIGHT CAROTID SYSTEM: Normal without aneurysm, dissection or stenosis. LEFT CAROTID SYSTEM: No dissection, occlusion or aneurysm. There is mixed density atherosclerosis extending into the proximal ICA, resulting in less than 50% stenosis. VERTEBRAL ARTERIES: Codominant configuration. Both origins are clearly patent. There is no dissection, occlusion or flow-limiting stenosis to the skull base (V1-V3 segments). CTA HEAD FINDINGS POSTERIOR CIRCULATION: --Vertebral arteries: Normal V4 segments. --Inferior cerebellar arteries: Normal. --Basilar artery: Normal. --Superior cerebellar arteries: Normal. --Posterior cerebral arteries (PCA): Normal. ANTERIOR CIRCULATION: --Intracranial internal carotid arteries: Atherosclerotic calcification of the internal carotid arteries at the skull base without hemodynamically significant stenosis. --Anterior cerebral arteries (ACA): Normal. Both A1 segments are present. Patent anterior communicating artery (a-comm). --Middle cerebral arteries (MCA): Normal. VENOUS SINUSES: As permitted by contrast timing, patent. ANATOMIC VARIANTS: None Review of the MIP images confirms the above findings. IMPRESSION: 1. No emergent large vessel occlusion or hemodynamically significant stenosis of the head or neck. 2. Aortic  Atherosclerosis (ICD10-I70.0). Electronically Signed   By: KUlyses JarredM.D.   On: 04/13/2022 03:10   MR BRAIN W WO CONTRAST  Result Date: 04/12/2022 CLINICAL DATA:  Intermittent headache and blurred vision 5 days. Abnormal head CT. EXAM: MRI HEAD WITHOUT AND WITH CONTRAST TECHNIQUE: Multiplanar, multiecho pulse sequences of the brain and surrounding structures were obtained without and with intravenous contrast. CONTRAST:  8.538mGADAVIST GADOBUTROL 1 MMOL/ML IV SOLN COMPARISON:  CT head without contrast 04/11/2022 FINDINGS: Brain: Diffusion-weighted images demonstrate restricted cortical diffusion in the inferior and medial left occipital pole. T2 and FLAIR hyperintensities are associated. Susceptibility is compatible with petechial hemorrhage. Moderate generalized atrophy and scattered white matter changes are otherwise present. No other acute infarct is present. T2  hyperintensities in the thalami bilaterally are consistent with remote ischemic change. Brainstem and cerebellum are within normal limits. The internal auditory canals are within normal limits. The ventricles are proportionate to the degree of atrophy. No significant extraaxial fluid collection is present. Vascular: Flow is present in the major intracranial arteries. Skull and upper cervical spine: A prominent soft disc protrusion at C4-5 displaces the cord posteriorly. Craniocervical junction is normal. Marrow signal is within normal limits. Sinuses/Orbits: The paranasal sinuses and mastoid air cells are clear. The globes and orbits are within normal limits. IMPRESSION: 1. 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 2. Moderate generalized atrophy and white matter disease likely reflects the sequela of chronic microvascular ischemia. 3. Prominent soft disc protrusion at C4-5 displaces the cord posteriorly. Consider MRI of the cervical spine without contrast if the patient is  symptomatic. These results were called by telephone at the time of interpretation on 04/12/2022 at 10:00 pm to provider [facility] , who verbally acknowledged these results. Electronically Signed   By: San Morelle M.D.   On: 04/12/2022 22:02    Cardiac Studies   Echocardiogram: 04/13/2022 IMPRESSIONS     1. Left ventricular ejection fraction, by estimation, is 20 to 25%. Left  ventricular ejection fraction by 2D MOD biplane is 21.5 %. The left  ventricle has severely decreased function. The left ventricle demonstrates  global hypokinesis. There is mild  left ventricular hypertrophy. Left ventricular diastolic parameters are  consistent with Grade I diastolic dysfunction (impaired relaxation). There  is incoordinate septal motion.   2. Right ventricular systolic function is low normal. The right  ventricular size is normal. Tricuspid regurgitation signal is inadequate  for assessing PA pressure.   3. The mitral valve is abnormal. Mild mitral valve regurgitation.   4. The aortic valve is tricuspid. Aortic valve regurgitation is trivial.  Aortic valve sclerosis is present, with no evidence of aortic valve  stenosis.   5. Aortic dilatation noted. There is mild dilatation of the aortic root,  measuring 40 mm.   6. The inferior vena cava is normal in size with greater than 50%  respiratory variability, suggesting right atrial pressure of 3 mmHg.   7. Agitated saline contrast bubble study was positive with shunting  observed after >6 cardiac cycles suggestive of intrapulmonary shunting. No  atrial level shunting was noted.   Comparison(s): No significant change from prior study. 12/04/2021: LVEF  20-25%.   Patient Profile     76 y.o. female w/ PMH of HFrEF (EF 20-25% by echo in 11/2021), HTN, HLD, Type 2 DM, prior tobacco use and family history of CAD who is currently admitted with a subacute CVA. Cardiology consulted due to cardiomyopathy.    Assessment & Plan   1.  HFrEF - Prior echo in 11/2021 showed an EF of 20-25% and ischemic evaluation was not pursued at that time but medical therapy has been adjusted by her PCP in the interim. Repeat echo this admission shows a similar EF of 20-25% with Grade 1 DD, low-normal RV function, mild MR, trivial AI and bubble study was positive for intrapulmonary shunting but no atrial level shunting.  - As discussed with Dr. Quentin Ore yesterday, would anticipate obtaining a Coronary CTA tomorrow as she is not a candidate for cath given her recent CVA. She is anxious to go home but is willing to stay for the test. She is having frequent PVC's and PAC's on telemetry which may impact the  quality of her study so will need to reassess in the AM. Will titrate Toprol-XL from '50mg'$  BID to '75mg'$  BID.  - Continue Farxiga '10mg'$  daily and Spironolactone 12.'5mg'$  daily. Would plan to add back Entresto prior to d/c (No mention of allowing permissive HTN by review of Neurology notes).   2. HTN - BP has stable at 104/86 - 127/79 within the past 24 hours. Remains on Spironolactone and Toprol-XL. PTA Amlodipine and HCTZ held. Would plan to restart [facility] tomorrow if BP allows.    3. HLD - FLP this admission shows LDL at 70. She has been transitioned from Pravastatin to Atorvastatin '80mg'$  daily.    4. PVC's/NSVT - She does have frequent PAC's and PVC's on telemetry with 4 beats NSVT. Would consider a Zio monitor at the time of discharge or as an outpatient to assess her PVC burden. Will titrate Toprol-XL from '50mg'$  BID to '75mg'$  BID.  - K+ at 3.6 today and will order supplementation. Try to keep ~ 4.0. Will check Mg with AM labs.    5. Subacute CVA - MRI shows a subacute cortical infarct involving the inferior and medial left occipital lobe with petechial hemorrhage. Stroke Team following.    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 Continue to follow as an outpatient.   For questions or updates, please contact [facility] Please consult [email or web address] for contact info under        Signed, Erma Heritage, PA-C  04/14/2022, 7:53 AM

## 2022-04-14 NOTE — Progress Notes (Addendum)
STROKE TEAM PROGRESS NOTE   INTERVAL HISTORY Seen sitting in the chair. Headache resolved, reports some peripheral vision loss on the right. Able to identify fingers in all fields of vision    May consider long term cardiac monitoring with Zio patch or loop recorder if venous ultrasound is negative.  MRI scan shows subacute left PCA infarct and CT angiogram shows no significant large vessel stenosis or occlusion.  Echocardiogram showed diminished ejection fraction of 20-25%.  There is positive right to left shunt on bubble study.  Carotid ultrasound shows no extracranial stenosis.  LDL cholesterol 70 mg percent.  Hemoglobin A1c 6.8.  Urine drug screen was negative.  Vitals:   04/13/22 2352 04/14/22 0416 04/14/22 0500 04/14/22 0745  BP: 121/70 118/80  120/69  Pulse: 92 85  86  Resp: '18 18  18  '$ Temp: 97.8 F (36.6 C) 98 F (36.7 C)  98.1 F (36.7 C)  TempSrc: Oral Oral  Oral  SpO2: 96% 97%  98%  Weight:   88.3 kg    CBC:  Recent Labs  Lab 04/12/22 1745 04/13/22 0036 04/13/22 0530 04/14/22 0215  WBC 5.8   < > 5.9 4.9  NEUTROABS 2.9  --   --   --   HGB 13.5   < > 12.7 12.6  HCT 41.0   < > 39.5 38.5  MCV 84.2   < > 84.9 83.3  PLT 270   < > 285 250   < > = values in this interval not displayed.   Basic Metabolic Panel:  Recent Labs  Lab 04/13/22 0530 04/14/22 0215  NA 140 142  K 3.5 3.6  CL 109 107  CO2 24 24  GLUCOSE 87 85  BUN 12 9  CREATININE 0.72 0.87  CALCIUM 8.9 9.3   Lipid Panel:  Recent Labs  Lab 04/13/22 0500  CHOL 121  TRIG 82  HDL 35*  CHOLHDL 3.5  VLDL 16  LDLCALC 70   HgbA1c:  Recent Labs  Lab 04/13/22 0036  HGBA1C 6.3*   Urine Drug Screen:  Recent Labs  Lab 04/12/22 2200  LABOPIA NONE DETECTED  COCAINSCRNUR NONE DETECTED  LABBENZ NONE DETECTED  AMPHETMU NONE DETECTED  THCU NONE DETECTED  LABBARB NONE DETECTED    Alcohol Level  Recent Labs  Lab 04/13/22 0523  ETH <10    IMAGING past 24 hours ECHOCARDIOGRAM COMPLETE BUBBLE  STUDY  Result Date: 04/13/2022    ECHOCARDIOGRAM REPORT   Patient Name:   Paula Wong Date of Exam: 04/13/2022 Medical Rec #:  [id number]        Height:       64.0 in Accession #:    [id number]       Weight:       190.0 lb Date of Birth:  Jan 28, 1946         BSA:          1.914 m Patient Age:    76 years         BP:           114/76 mmHg Patient Gender: F                HR:           90 bpm. Exam Location:  Inpatient Procedure: 2D Echo, Cardiac Doppler, Color Doppler and Saline Contrast Bubble            Study Indications:    Stroke  History:  Patient has prior history of Echocardiogram examinations, most                 recent 12/04/2021. Risk Factors:Hypertension, Diabetes and                 Dyslipidemia.  Sonographer:    Merrie Roof RDCS Referring Phys: [place]  1. Left ventricular ejection fraction, by estimation, is 20 to 25%. Left ventricular ejection fraction by 2D MOD biplane is 21.5 %. The left ventricle has severely decreased function. The left ventricle demonstrates global hypokinesis. There is mild left ventricular hypertrophy. Left ventricular diastolic parameters are consistent with Grade I diastolic dysfunction (impaired relaxation). There is incoordinate septal motion.  2. Right ventricular systolic function is low normal. The right ventricular size is normal. Tricuspid regurgitation signal is inadequate for assessing PA pressure.  3. The mitral valve is abnormal. Mild mitral valve regurgitation.  4. The aortic valve is tricuspid. Aortic valve regurgitation is trivial. Aortic valve sclerosis is present, with no evidence of aortic valve stenosis.  5. Aortic dilatation noted. There is mild dilatation of the aortic root, measuring 40 mm.  6. The inferior vena cava is normal in size with greater than 50% respiratory variability, suggesting right atrial pressure of 3 mmHg.  7. Agitated saline contrast bubble study was positive with shunting observed after >6 cardiac cycles  suggestive of intrapulmonary shunting. No atrial level shunting was noted. Comparison(s): No significant change from prior study. 12/04/2021: LVEF 20-25%. FINDINGS  Left Ventricle: Left ventricular ejection fraction, by estimation, is 20 to 25%. Left ventricular ejection fraction by 2D MOD biplane is 21.5 %. The left ventricle has severely decreased function. The left ventricle demonstrates global hypokinesis. The left ventricular internal cavity size was normal in size. There is mild left ventricular hypertrophy. Incoordinate septal motion. Left ventricular diastolic parameters are consistent with Grade I diastolic dysfunction (impaired relaxation). Indeterminate  filling pressures. Right Ventricle: The right ventricular size is normal. No increase in right ventricular wall thickness. Right ventricular systolic function is low normal. Tricuspid regurgitation signal is inadequate for assessing PA pressure. Left Atrium: Left atrial size was normal in size. Right Atrium: Right atrial size was normal in size. Pericardium: There is no evidence of pericardial effusion. Mitral Valve: The mitral valve is abnormal. Mild mitral annular calcification. Mild mitral valve regurgitation. Tricuspid Valve: The tricuspid valve is grossly normal. Tricuspid valve regurgitation is trivial. Aortic Valve: The aortic valve is tricuspid. Aortic valve regurgitation is trivial. Aortic valve sclerosis is present, with no evidence of aortic valve stenosis. Aortic valve mean gradient measures 2.0 mmHg. Aortic valve peak gradient measures 3.3 mmHg. Aortic valve area, by VTI measures 2.12 cm. Pulmonic Valve: The pulmonic valve was grossly normal. Pulmonic valve regurgitation is trivial. Aorta: Aortic dilatation noted. There is mild dilatation of the aortic root, measuring 40 mm. Venous: The inferior vena cava is normal in size with greater than 50% respiratory variability, suggesting right atrial pressure of 3 mmHg. IAS/Shunts: No atrial level  shunt detected by color flow Doppler. Agitated saline contrast was given intravenously to evaluate for intracardiac shunting. Agitated saline contrast bubble study was positive with shunting observed after >6 cardiac cycles suggestive of intrapulmonary shunting.  LEFT VENTRICLE PLAX 2D                        Biplane EF (MOD) LVIDd:         5.30 cm  LV Biplane EF:   Left LVIDs:         4.80 cm                          ventricular LV PW:         1.20 cm                          ejection LV IVS:        1.20 cm                          fraction by LVOT diam:     2.10 cm                          2D MOD LV SV:         35                               biplane is LV SV Index:   18                               21.5 %. LVOT Area:     3.46 cm                                Diastology                                LV e' medial:    2.76 cm/s LV Volumes (MOD)               LV E/e' medial:  19.4 LV vol d, MOD    143.0 ml      LV e' lateral:   2.76 cm/s A2C:                           LV E/e' lateral: 19.4 LV vol d, MOD    147.0 ml A4C: LV vol s, MOD    112.0 ml A2C: LV vol s, MOD    110.0 ml A4C: LV SV MOD A2C:   31.0 ml LV SV MOD A4C:   147.0 ml LV SV MOD BP:    32.1 ml RIGHT VENTRICLE RV Basal diam:  3.10 cm RV S prime:     10.70 cm/s TAPSE (M-mode): 1.4 cm LEFT ATRIUM             Index        RIGHT ATRIUM           Index LA diam:        3.00 cm 1.57 cm/m   RA Area:     12.70 cm LA Vol (A2C):   72.7 ml 37.98 ml/m  RA Volume:   30.70 ml  16.04 ml/m LA Vol (A4C):   43.9 ml 22.93 ml/m LA Biplane Vol: 57.0 ml 29.78 ml/m  AORTIC VALVE AV Area (Vmax):    2.34 cm AV Area (Vmean):   2.19 cm AV Area (VTI):     2.12 cm AV Vmax:           91.40 cm/s AV Vmean:  62.600 cm/s AV VTI:            0.167 m AV Peak Grad:      3.3 mmHg AV Mean Grad:      2.0 mmHg LVOT Vmax:         61.80 cm/s LVOT Vmean:        39.500 cm/s LVOT VTI:          0.102 m LVOT/AV VTI ratio: 0.61  AORTA Ao Root diam: 4.00 cm Ao Asc diam:  3.20 cm  MITRAL VALVE MV Area (PHT): 3.72 cm     SHUNTS MV Decel Time: 204 msec     Systemic VTI:  0.10 m MV E velocity: 53.50 cm/s   Systemic Diam: 2.10 cm MV A velocity: 102.00 cm/s MV E/A ratio:  0.52 Lyman Bishop MD Electronically signed by Lyman Bishop MD Signature Date/Time: 04/13/2022/4:49:00 PM    Final     PHYSICAL EXAM  Physical Exam  Constitutional: Appears well-developed and well-nourished elderly lady.   Cardiovascular: Normal rate and regular rhythm.  Respiratory: Effort normal, non-labored breathing  Neuro: Mental Status: Patient is awake, alert, oriented to person, place, month, year, and situation. Patient is able to give a clear and coherent history. No signs of aphasia or neglect Cranial Nerves: II: Visual Fields are full. Pupils are equal, round, and reactive to light.  Subjective peripheral vision loss on the right, however accurate with visual field testing III,IV, VI: EOMI without ptosis or diploplia.  V: Facial sensation is symmetric to temperature VII: Facial movement is symmetric resting and smiling VIII: Hearing is intact to voice X: Palate elevates symmetrically XI: Shoulder shrug is symmetric. XII: Tongue protrudes midline without atrophy or fasciculations.  Motor: Tone is normal. Bulk is normal. 5/5 strength was present in all four extremities.  Sensory: Sensation is symmetric to light touch and temperature in the arms and legs. No extinction to DSS present.  Cerebellar: FNF and HKS are intact bilaterally    ASSESSMENT/PLAN Ms. Paula Wong is a 76 y.o. female with history of DM, HTN, HLD, HFrEF presenting after an abnormal outpatient head CT. Patient states on Sunday she awoke at 0400 with a headache on the top of her head and then pain ran down the left side of her face. She then called her PCP on Monday and ordered a CT head that was scheduled for Thursday. On Friday evening she received a call from her PCP recommending her to go to the ED for further  evaluation. Ct head with edema in the left occipital lobe concern for Left PCA infarct.   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 2D Echo EF 20-25%, severe left global hypokinesis, positive bubble study  LDL 70 HgbA1c 6.3 VTE prophylaxis - lovenox    Diet   Diet heart healthy/carb modified Room service appropriate? Yes; Fluid consistency: Thin   No antithrombotic prior to admission, now on aspirin 81 mg daily and clopidogrel 75 mg daily.  For 3 weeks followed by aspirin alone Therapy recommendations:  Outpatient OT  Disposition:  pending  Hypertension Home meds:  metoprolol, hydrochlorothiazide Stable Permissive hypertension (OK if < 220/120) but gradually normalize in 5-7 days Long-term BP goal normotensive  Hyperlipidemia Home meds:  pravastatin '20mg'$ , resumed in hospital LDL 70,  goal < 70 Continue statin at discharge  PFO Positive bubble study on echo Venous duplex pending Consider cardiac monitoring long term roPE Score- 2 HTN, DM, Stroke, non smoker, cortical infarct on imaging  Diabetes type II Controlled Home meds:  Tyler Aas, ozempic, metformin HgbA1c 6.3, goal < 7.0 CBGs Recent Labs    04/13/22 1527 04/13/22 2012 04/14/22 0642  GLUCAP 100* 187* 82    SSI  Other Stroke Risk Factors Advanced Age >/= 30  Obesity, Body mass index is 33.41 kg/m., BMI >/= 30 associated with increased stroke risk, recommend weight loss, diet and exercise as appropriate  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 MD to update note as needed.   Janine Ores, DNP, FNP-BC [facility] Pager: [phone]   I have personally obtained history,examined this patient, reviewed notes, independently viewed imaging studies, participated in medical decision making and plan of care.ROS completed by me personally and pertinent positives fully documented  I have made any additions or clarifications directly to the above note. Agree with note above.  Patient presented with few days of headache and some subjective right-sided vision difficulties and MRI scan shows 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  2D echo shows diminished ejection fraction but no definite clot with positive right to left shunt.  Recommend check lower extremity venous Dopplers for DVT and he may need prolonged cardiac monitoring at discharge for paroxysmal A-fib.  Recommend aspirin and Plavix for 3 weeks followed by aspirin alone and aggressive risk factor modification.  Greater than 50% time during this 50-minute visit was spent in counseling and coordination of care about her PCA infarct and discussion about stroke evaluation and treatment and answering questions.  Antony Contras, MD Medical Director [place] Pager: [phone] 04/14/2022 2:07 PM   To contact Stroke Continuity provider, please refer to [email or web address] After hours, contact General Neurology

## 2022-04-14 NOTE — Progress Notes (Signed)
Physical Therapy Note  Spoke with occupational therapy after their initial evaluation. OT reports patient is functioning at a high level of independence and no physical therapy is indicated at this time. PT is signing-off. Please re-order if there is any significant change in status. Thank you for this referral.  Jaydyn Menon, PT, DPT Physical Therapist Acute [facility] Services [facility]    

## 2022-04-14 NOTE — Care Management Obs Status (Signed)
[facility] NOTIFICATION   Patient Details  Name: Paula Wong MRN: [id number] Date of Birth: [birth year]   Medicare Observation Status Notification Given:  Yes    Carles Collet, RN 04/14/2022, 10:57 AM

## 2022-04-14 NOTE — Progress Notes (Addendum)
PROGRESS NOTE    Paula Wong  HBZ:[id number] DOB: 1946-08-01 DOA: 04/12/2022 PCP: Arthur Holms, NP    Brief Narrative:  Paula Wong is a 76 y.o. female with medical history significant for type 2 diabetes, hypertension, hyperlipidemia, HFrEF 20 to 25%, presented to hospital with abnormal CT scan findings by her primary care physician.  Patient had been having moderate to severe headache for which PCP had ordered a CT scan.  In the ED, patient was noted to have subacute cortical infarct  involving the inferior medial left occipital pole with petechial hemorrhage patient was asked to hospital for further evaluation and treatment.  Assessment and plan. Principal Problem:   Acute CVA (cerebrovascular accident) ([place]) Active Problems:   Hypertension   Diabetes ([place])   Hyperlipidemia   Chronic systolic CHF (congestive heart failure) ([place])   Subacute ischemic CVA:  Patient had been having headache prior to presentation.  CT scan done outpatient showed edema in the left occipital lobe so MRI was done in the hospital which showed subacute cortical infarct involving the inferior and medial left occipital pole with petechial hemorrhage.  No large vessel occlusion on the CTA of the head and neck.  Hemoglobin A1c of 6.3.  LDL of 70.  2D echocardiogram with LV ejection fraction of 20 to 25% with severely decreased function and global hypokinesis and grade 1 diastolic dysfunction with positive bubble study..  Therapy has recommended outpatient PT.  Continue Lipitor 80 mg daily and aspirin 325 mg daily.patient might need prolonged cardiac monitoring at discharge.  Neurology recommends aspirin Plavix for 3 weeks followed by aspirin alone    Uncontrolled hypertension:  Amlodipine was discontinued.  On metoprolol.  We will continue.    Type 2 diabetes mellitus: Hemoglobin A1c of 6.3.  Currently on sliding scale  insulin.   Patient is on Ozempic, Tresiba, metformin ,farxiga at home.  Resume farxiga    Chronic systolic congestive heart failure: Compensated.  Of note patient did have 2D echocardiogram on 12/09/2021 which showed LV ejection fraction of 20 to 25% with global hypokinesis.  Patient was not aware of this finding.  Cardiology has been consulted at this time.  She is supposed to have a stress test next week as scheduled by PCP.  Home meds show  spironolactone,  metoprolol,lisinopril,entresto.  Not sure why she is taking it.  We will need good med reconciliation on discharge.  Plan for CTA coronary tomorrow.   Headache: Continue symptomatic treatment.  Denies any headache today.   DVT prophylaxis: enoxaparin (LOVENOX) injection 40 mg Start: 04/13/22 1000   Code Status:     Code Status: Full Code  Disposition: Home Status is: Observation  The patient will require care spanning > 2 midnights and should be moved to inpatient because: Need for coronary CT CVA,   Family Communication:  None at bedside,communicated with the patient at bedside.  I tried to reach the patient's contact on the phone listed but was unable to reach.  Consultants:  Cardiology Neurology  Procedures:  None  Antimicrobials:  None  Anti-infectives (From admission, onward)    None      Subjective: Today, patient was seen and examined at bedside.  Patient denies any dizziness, lightheadedness shortness of breath chest pain palpitation.  Denies any headache.  Objective: Vitals:   04/14/22 0416 04/14/22 0500 04/14/22 0745 04/14/22 1149  BP: 118/80  120/69 134/87  Pulse: 85  86 93  Resp: '18  18 18  '$ Temp: 98 F (36.7 C)  98.1  F (36.7 C)   TempSrc: Oral  Oral   SpO2: 97%  98% 99%  Weight:  88.3 kg     No intake or output data in the 24 hours ending 04/14/22 1411 Filed Weights   04/14/22 0500  Weight: 88.3 kg    Physical Examination: Body mass index is 33.41 kg/m.  General: Obese built, not in obvious distress HENT:   No scleral pallor or icterus noted. Oral mucosa is moist.  Chest:   Clear breath sounds.  Diminished breath sounds bilaterally. No crackles or wheezes.  CVS: S1 &S2 heard. No murmur.  Regular rate and rhythm. Abdomen: Soft, nontender, nondistended.  Bowel sounds are heard.   Extremities: No cyanosis, clubbing or edema.  Peripheral pulses are palpable. Psych: Alert, awake and oriented, normal mood CNS:  No cranial nerve deficits.  Power equal in all extremities.   Skin: Warm and dry.  No rashes noted.  Data Reviewed:   CBC: Recent Labs  Lab 04/12/22 1745 04/13/22 0036 04/13/22 0530 04/14/22 0215  WBC 5.8 6.4 5.9 4.9  NEUTROABS 2.9  --   --   --   HGB 13.5 13.6 12.7 12.6  HCT 41.0 42.6 39.5 38.5  MCV 84.2 85.2 84.9 83.3  PLT 270 272 285 469    Basic Metabolic Panel: Recent Labs  Lab 04/12/22 1745 04/13/22 0036 04/13/22 0530 04/14/22 0215  NA 142  --  140 142  K 3.8  --  3.5 3.6  CL 108  --  109 107  CO2 24  --  24 24  GLUCOSE 137*  --  87 85  BUN 16  --  12 9  CREATININE 0.94 0.87 0.72 0.87  CALCIUM 9.8  --  8.9 9.3    Liver Function Tests: Recent Labs  Lab 04/12/22 1745  AST 21  ALT 15  ALKPHOS 41  BILITOT 0.9  PROT 7.8  ALBUMIN 4.3     Radiology Studies: VAS [place] LOWER EXTREMITY VENOUS (DVT)  Result Date: 04/14/2022  Lower Venous DVT Study Patient Name:  Paula Wong  Date of Exam:   04/14/2022 Medical Rec #: [id number]         Accession #:    [id number] Date of Birth: [birth year]          Patient Gender: F Patient Age:   31 years Exam Location:  [facility] Procedure:      VAS [place] LOWER EXTREMITY VENOUS (DVT) Referring Phys: Janine Ores --------------------------------------------------------------------------------  Indications: Stroke, and PFO.  Performing Technologist: Sharion Dove RVS  Examination Guidelines: A complete evaluation includes B-mode imaging, spectral Doppler, color Doppler, and power Doppler as needed of all accessible portions of each vessel. Bilateral testing is considered an integral part of a  complete examination. Limited examinations for reoccurring indications may be performed as noted. The reflux portion of the exam is performed with the patient in reverse Trendelenburg.  +---------+---------------+---------+-----------+----------+--------------+ RIGHT    CompressibilityPhasicitySpontaneityPropertiesThrombus Aging +---------+---------------+---------+-----------+----------+--------------+ CFV      Full           Yes      Yes                                 +---------+---------------+---------+-----------+----------+--------------+ SFJ      Full                                                        +---------+---------------+---------+-----------+----------+--------------+  FV Prox  Full                                                        +---------+---------------+---------+-----------+----------+--------------+ FV Mid   Full                                                        +---------+---------------+---------+-----------+----------+--------------+ FV DistalFull                                                        +---------+---------------+---------+-----------+----------+--------------+ PFV      Full                                                        +---------+---------------+---------+-----------+----------+--------------+ POP      Full           Yes      Yes                                 +---------+---------------+---------+-----------+----------+--------------+ PTV      Full                                                        +---------+---------------+---------+-----------+----------+--------------+ PERO     Full                                                        +---------+---------------+---------+-----------+----------+--------------+   +---------+---------------+---------+-----------+----------+--------------+ LEFT     CompressibilityPhasicitySpontaneityPropertiesThrombus Aging  +---------+---------------+---------+-----------+----------+--------------+ CFV      Full           Yes      Yes                                 +---------+---------------+---------+-----------+----------+--------------+ SFJ      Full                                                        +---------+---------------+---------+-----------+----------+--------------+ FV Prox  Full                                                        +---------+---------------+---------+-----------+----------+--------------+  FV Mid   Full                                                        +---------+---------------+---------+-----------+----------+--------------+ FV DistalFull                                                        +---------+---------------+---------+-----------+----------+--------------+ PFV      Full                                                        +---------+---------------+---------+-----------+----------+--------------+ POP      Full           Yes      Yes                                 +---------+---------------+---------+-----------+----------+--------------+ PTV      Full                                                        +---------+---------------+---------+-----------+----------+--------------+ PERO     Full                                                        +---------+---------------+---------+-----------+----------+--------------+     Summary: BILATERAL: - No evidence of deep vein thrombosis seen in the lower extremities, bilaterally. -No evidence of popliteal cyst, bilaterally.   *See table(s) above for measurements and observations.    Preliminary    ECHOCARDIOGRAM COMPLETE BUBBLE STUDY  Result Date: 04/13/2022    ECHOCARDIOGRAM REPORT   Patient Name:   Vina Byrd Date of Exam: 04/13/2022 Medical Rec #:  [id number]        Height:       64.0 in Accession #:    [id number]       Weight:       190.0 lb Date of Birth:   Oct 06, 1945         BSA:          1.914 m Patient Age:    51 years         BP:           114/76 mmHg Patient Gender: F                HR:           90 bpm. Exam Location:  Inpatient Procedure: 2D Echo, Cardiac Doppler, Color Doppler and [place]ine Contrast Bubble            Study Indications:    Stroke  History:        Patient has  prior history of Echocardiogram examinations, most                 recent 12/04/2021. Risk Factors:Hypertension, Diabetes and                 Dyslipidemia.  Sonographer:    Merrie Roof RDCS Referring Phys: [place]  1. Left ventricular ejection fraction, by estimation, is 20 to 25%. Left ventricular ejection fraction by 2D MOD biplane is 21.5 %. The left ventricle has severely decreased function. The left ventricle demonstrates global hypokinesis. There is mild left ventricular hypertrophy. Left ventricular diastolic parameters are consistent with Grade I diastolic dysfunction (impaired relaxation). There is incoordinate septal motion.  2. Right ventricular systolic function is low normal. The right ventricular size is normal. Tricuspid regurgitation signal is inadequate for assessing PA pressure.  3. The mitral valve is abnormal. Mild mitral valve regurgitation.  4. The aortic valve is tricuspid. Aortic valve regurgitation is trivial. Aortic valve sclerosis is present, with no evidence of aortic valve stenosis.  5. Aortic dilatation noted. There is mild dilatation of the aortic root, measuring 40 mm.  6. The inferior vena cava is normal in size with greater than 50% respiratory variability, suggesting right atrial pressure of 3 mmHg.  7. Agitated saline contrast bubble study was positive with shunting observed after >6 cardiac cycles suggestive of intrapulmonary shunting. No atrial level shunting was noted. Comparison(s): No significant change from prior study. 12/04/2021: LVEF 20-25%. FINDINGS  Left Ventricle: Left ventricular ejection fraction, by estimation, is 20 to 25%. Left  ventricular ejection fraction by 2D MOD biplane is 21.5 %. The left ventricle has severely decreased function. The left ventricle demonstrates global hypokinesis. The left ventricular internal cavity size was normal in size. There is mild left ventricular hypertrophy. Incoordinate septal motion. Left ventricular diastolic parameters are consistent with Grade I diastolic dysfunction (impaired relaxation). Indeterminate  filling pressures. Right Ventricle: The right ventricular size is normal. No increase in right ventricular wall thickness. Right ventricular systolic function is low normal. Tricuspid regurgitation signal is inadequate for assessing PA pressure. Left Atrium: Left atrial size was normal in size. Right Atrium: Right atrial size was normal in size. Pericardium: There is no evidence of pericardial effusion. Mitral Valve: The mitral valve is abnormal. Mild mitral annular calcification. Mild mitral valve regurgitation. Tricuspid Valve: The tricuspid valve is grossly normal. Tricuspid valve regurgitation is trivial. Aortic Valve: The aortic valve is tricuspid. Aortic valve regurgitation is trivial. Aortic valve sclerosis is present, with no evidence of aortic valve stenosis. Aortic valve mean gradient measures 2.0 mmHg. Aortic valve peak gradient measures 3.3 mmHg. Aortic valve area, by VTI measures 2.12 cm. Pulmonic Valve: The pulmonic valve was grossly normal. Pulmonic valve regurgitation is trivial. Aorta: Aortic dilatation noted. There is mild dilatation of the aortic root, measuring 40 mm. Venous: The inferior vena cava is normal in size with greater than 50% respiratory variability, suggesting right atrial pressure of 3 mmHg. IAS/Shunts: No atrial level shunt detected by color flow Doppler. Agitated saline contrast was given intravenously to evaluate for intracardiac shunting. Agitated saline contrast bubble study was positive with shunting observed after >6 cardiac cycles suggestive of intrapulmonary  shunting.  LEFT VENTRICLE PLAX 2D                        Biplane EF (MOD) LVIDd:         5.30 cm         LV  Biplane EF:   Left LVIDs:         4.80 cm                          ventricular LV PW:         1.20 cm                          ejection LV IVS:        1.20 cm                          fraction by LVOT diam:     2.10 cm                          2D MOD LV SV:         35                               biplane is LV SV Index:   18                               21.5 %. LVOT Area:     3.46 cm                                Diastology                                LV e' medial:    2.76 cm/s LV Volumes (MOD)               LV E/e' medial:  19.4 LV vol d, MOD    143.0 ml      LV e' lateral:   2.76 cm/s A2C:                           LV E/e' lateral: 19.4 LV vol d, MOD    147.0 ml A4C: LV vol s, MOD    112.0 ml A2C: LV vol s, MOD    110.0 ml A4C: LV SV MOD A2C:   31.0 ml LV SV MOD A4C:   147.0 ml LV SV MOD BP:    32.1 ml RIGHT VENTRICLE RV Basal diam:  3.10 cm RV S prime:     10.70 cm/s TAPSE (M-mode): 1.4 cm LEFT ATRIUM             Index        RIGHT ATRIUM           Index LA diam:        3.00 cm 1.57 cm/m   RA Area:     12.70 cm LA Vol (A2C):   72.7 ml 37.98 ml/m  RA Volume:   30.70 ml  16.04 ml/m LA Vol (A4C):   43.9 ml 22.93 ml/m LA Biplane Vol: 57.0 ml 29.78 ml/m  AORTIC VALVE AV Area (Vmax):    2.34 cm AV Area (Vmean):   2.19 cm AV Area (VTI):     2.12 cm AV Vmax:           91.40 cm/s AV Vmean:  62.600 cm/s AV VTI:            0.167 m AV Peak Grad:      3.3 mmHg AV Mean Grad:      2.0 mmHg LVOT Vmax:         61.80 cm/s LVOT Vmean:        39.500 cm/s LVOT VTI:          0.102 m LVOT/AV VTI ratio: 0.61  AORTA Ao Root diam: 4.00 cm Ao Asc diam:  3.20 cm MITRAL VALVE MV Area (PHT): 3.72 cm     SHUNTS MV Decel Time: 204 msec     Systemic VTI:  0.10 m MV E velocity: 53.50 cm/s   Systemic Diam: 2.10 cm MV A velocity: 102.00 cm/s MV E/A ratio:  0.52 Lyman Bishop MD Electronically signed by Lyman Bishop  MD Signature Date/Time: 04/13/2022/4:49:00 PM    Final    CT ANGIO HEAD NECK W WO CM  Result Date: 04/13/2022 CLINICAL DATA:  Hemorrhagic stroke EXAM: CT ANGIOGRAPHY HEAD AND NECK TECHNIQUE: Multidetector CT imaging of the head and neck was performed using the standard protocol during bolus administration of intravenous contrast. Multiplanar CT image reconstructions and MIPs were obtained to evaluate the vascular anatomy. Carotid stenosis measurements (when applicable) are obtained utilizing NASCET criteria, using the distal internal carotid diameter as the denominator. RADIATION DOSE REDUCTION: This exam was performed according to the departmental dose-optimization program which includes automated exposure control, adjustment of the mA and/or kV according to patient size and/or use of iterative reconstruction technique. CONTRAST:  25m OMNIPAQUE IOHEXOL 350 MG/ML SOLN COMPARISON:  None Available. FINDINGS: CTA NECK FINDINGS SKELETON: There is no bony spinal canal stenosis. No lytic or blastic lesion. OTHER NECK: Normal pharynx, larynx and major salivary glands. No cervical lymphadenopathy. Unremarkable thyroid gland. UPPER CHEST: No pneumothorax or pleural effusion. No nodules or masses. AORTIC ARCH: There is calcific atherosclerosis of the aortic arch. There is no aneurysm, dissection or hemodynamically significant stenosis of the visualized portion of the aorta. Conventional 3 vessel aortic branching pattern. The visualized proximal subclavian arteries are widely patent. RIGHT CAROTID SYSTEM: Normal without aneurysm, dissection or stenosis. LEFT CAROTID SYSTEM: No dissection, occlusion or aneurysm. There is mixed density atherosclerosis extending into the proximal ICA, resulting in less than 50% stenosis. VERTEBRAL ARTERIES: Codominant configuration. Both origins are clearly patent. There is no dissection, occlusion or flow-limiting stenosis to the skull base (V1-V3 segments). CTA HEAD FINDINGS POSTERIOR  CIRCULATION: --Vertebral arteries: Normal V4 segments. --Inferior cerebellar arteries: Normal. --Basilar artery: Normal. --Superior cerebellar arteries: Normal. --Posterior cerebral arteries (PCA): Normal. ANTERIOR CIRCULATION: --Intracranial internal carotid arteries: Atherosclerotic calcification of the internal carotid arteries at the skull base without hemodynamically significant stenosis. --Anterior cerebral arteries (ACA): Normal. Both A1 segments are present. Patent anterior communicating artery (a-comm). --Middle cerebral arteries (MCA): Normal. VENOUS SINUSES: As permitted by contrast timing, patent. ANATOMIC VARIANTS: None Review of the MIP images confirms the above findings. IMPRESSION: 1. No emergent large vessel occlusion or hemodynamically significant stenosis of the head or neck. 2. Aortic Atherosclerosis (ICD10-I70.0). Electronically Signed   By: KUlyses JarredM.D.   On: 04/13/2022 03:10   MR BRAIN W WO CONTRAST  Result Date: 04/12/2022 CLINICAL DATA:  Intermittent headache and blurred vision 5 days. Abnormal head CT. EXAM: MRI HEAD WITHOUT AND WITH CONTRAST TECHNIQUE: Multiplanar, multiecho pulse sequences of the brain and surrounding structures were obtained without and with intravenous contrast. CONTRAST:  8.568mGADAVIST GADOBUTROL 1 MMOL/ML IV SOLN COMPARISON:  CT head without contrast 04/11/2022 FINDINGS: Brain: Diffusion-weighted images demonstrate restricted cortical diffusion in the inferior and medial left occipital pole. T2 and FLAIR hyperintensities are associated. Susceptibility is compatible with petechial hemorrhage. Moderate generalized atrophy and scattered white matter changes are otherwise present. No other acute infarct is present. T2 hyperintensities in the thalami bilaterally are consistent with remote ischemic change. Brainstem and cerebellum are within normal limits. The internal auditory canals are within normal limits. The ventricles are proportionate to the degree of  atrophy. No significant extraaxial fluid collection is present. Vascular: Flow is present in the major intracranial arteries. Skull and upper cervical spine: A prominent soft disc protrusion at C4-5 displaces the cord posteriorly. Craniocervical junction is normal. Marrow signal is within normal limits. Sinuses/Orbits: The paranasal sinuses and mastoid air cells are clear. The globes and orbits are within normal limits. IMPRESSION: 1. 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 2. Moderate generalized atrophy and white matter disease likely reflects the sequela of chronic microvascular ischemia. 3. Prominent soft disc protrusion at C4-5 displaces the cord posteriorly. Consider MRI of the cervical spine without contrast if the patient is symptomatic. These results were called by telephone at the time of interpretation on 04/12/2022 at 10:00 pm to provider [facility] , who verbally acknowledged these results. Electronically Signed   By: San Morelle M.D.   On: 04/12/2022 22:02      LOS: 0 days    Flora Lipps, MD [facility] Available via Epic secure chat 7am-7pm After these hours, please refer to coverage provider listed on [email or web address] 04/14/2022, 2:11 PM

## 2022-04-14 NOTE — Progress Notes (Signed)
VASCULAR LAB    Bilateral lower extremity venous duplex has been performed.  See CV proc for preliminary results.   Serene Kopf, RVT 04/14/2022, 12:08 PM

## 2022-04-14 NOTE — Hospital Course (Signed)
Paula Wong is a 76 y.o. female with medical history significant for type 2 diabetes, hypertension, hyperlipidemia, HFrEF 20 to 25%, presented to hospital with abnormal CT scan findings by her primary care physician.  Patient had been having moderate to severe headache for which PCP had ordered a CT scan.  In the ED, patient was noted to have subacute cortical infarct  involving the inferior medial left occipital pole with petechial hemorrhage patient was asked to hospital for further evaluation and treatment.  Assessment and plan.  Subacute ischemic CVA:  Patient had been having headache prior to presentation.  CT scan done outpatient showed edema in the left occipital lobe so MRI was done in the hospital which showed subacute cortical infarct involving the inferior and medial left occipital pole with petechial hemorrhage.  No large vessel occlusion on the CTA of the head and neck.  Hemoglobin A1c of 6.3.  LDL of 70.  2D echocardiogram with LV ejection fraction of 20 to 25% with severely decreased function and global hypokinesis and grade 1 diastolic dysfunction.  Pending PT/OT elevation.  Continue Lipitor 80 mg daily and aspirin 325 mg daily   Uncontrolled hypertension:  Amlodipine was discontinued.  On metoprolol.  We will continue.    Type 2 diabetes mellitus: Hemoglobin A1c of 6.3.  Currently on sliding scale  insulin.   Patient is on Ozempic, Tresiba, metformin ,farxiga at home.  Resume farxiga   Chronic systolic congestive heart failure: Compensated.  Of note patient did have 2D echocardiogram on 12/09/2021 which showed LV ejection fraction of 20 to 25% with global hypokinesis.  Patient was not aware of this finding.  Cardiology has been consulted at this time.  She is supposed to have a stress test next week as scheduled by PCP.  Home meds show  spironolactone,  metoprolol,lisinopril,entresto.  Not sure why she is taking it.  We will need good med reconciliation on discharge.   Headache:  Symptomatic treatment.

## 2022-04-15 ENCOUNTER — Encounter (HOSPITAL_COMMUNITY)
Admission: EM | Disposition: A | Discharge status: Home / Self Care | Payer: Self-pay | Source: Home / Self Care | Attending: Internal Medicine

## 2022-04-15 ENCOUNTER — Observation Stay (HOSPITAL_COMMUNITY): Payer: Medicare Other

## 2022-04-15 ENCOUNTER — Other Ambulatory Visit (HOSPITAL_COMMUNITY): Payer: 59

## 2022-04-15 DIAGNOSIS — E1169 Type 2 diabetes mellitus with other specified complication: Secondary | ICD-10-CM | POA: Diagnosis not present

## 2022-04-15 DIAGNOSIS — I639 Cerebral infarction, unspecified: Secondary | ICD-10-CM | POA: Diagnosis not present

## 2022-04-15 DIAGNOSIS — I2510 Atherosclerotic heart disease of native coronary artery without angina pectoris: Secondary | ICD-10-CM | POA: Diagnosis not present

## 2022-04-15 DIAGNOSIS — I5022 Chronic systolic (congestive) heart failure: Secondary | ICD-10-CM | POA: Diagnosis not present

## 2022-04-15 DIAGNOSIS — R931 Abnormal findings on diagnostic imaging of heart and coronary circulation: Secondary | ICD-10-CM | POA: Diagnosis not present

## 2022-04-15 DIAGNOSIS — E785 Hyperlipidemia, unspecified: Secondary | ICD-10-CM | POA: Diagnosis not present

## 2022-04-15 HISTORY — PX: LOOP RECORDER INSERTION: EP1214

## 2022-04-15 LAB — BASIC METABOLIC PANEL
Anion gap: 11 (ref 5–15)
BUN: 13 mg/dL (ref 8–23)
CO2: 22 mmol/L (ref 22–32)
Calcium: 9.5 mg/dL (ref 8.9–10.3)
Chloride: 107 mmol/L (ref 98–111)
Creatinine, Ser: 0.91 mg/dL (ref 0.44–1.00)
GFR, Estimated: >60 mL/min (ref >60)
Glucose, Bld: 120 mg/dL — ABNORMAL HIGH (ref 70–99)
Potassium: 4.2 mmol/L (ref 3.5–5.1)
Sodium: 140 mmol/L (ref 135–145)

## 2022-04-15 LAB — CBC
HCT: 39.4 % (ref 36.0–46.0)
Hemoglobin: 13.1 g/dL (ref 12.0–15.0)
MCH: 27.5 pg (ref 26.0–34.0)
MCHC: 33.2 g/dL (ref 30.0–36.0)
MCV: 82.8 fL (ref 80.0–100.0)
Platelets: 256 10*3/uL (ref 150–400)
RBC: 4.76 MIL/uL (ref 3.87–5.11)
RDW: 13.7 % (ref 11.5–15.5)
WBC: 4.9 10*3/uL (ref 4.0–10.5)
nRBC: 0 % (ref 0.0–0.2)

## 2022-04-15 LAB — GLUCOSE, CAPILLARY
Glucose-Capillary: 98 mg/dL (ref 70–99)
Glucose-Capillary: 142 mg/dL — ABNORMAL HIGH (ref 70–99)
Glucose-Capillary: 96 mg/dL (ref 70–99)

## 2022-04-15 SURGERY — LOOP RECORDER INSERTION

## 2022-04-15 MED ORDER — SODIUM CHLORIDE 0.9% FLUSH: 3.0000 mL | INTRAVENOUS | Status: DC | PRN

## 2022-04-15 MED ORDER — SODIUM CHLORIDE 0.9 % IV SOLN: 250.0000 mL | INTRAVENOUS | Status: DC | PRN

## 2022-04-15 MED ORDER — LIDOCAINE-EPINEPHRINE 1 %-1:100000 IJ SOLN
INTRAMUSCULAR | Status: AC
  Filled 2022-04-15: qty 1

## 2022-04-15 MED ORDER — NITROGLYCERIN 0.4 MG SL SUBL
SUBLINGUAL_TABLET | SUBLINGUAL | Status: AC
  Administered 2022-04-15: 0.8 mg via SUBLINGUAL
  Filled 2022-04-15: qty 2

## 2022-04-15 MED ORDER — IVABRADINE HCL 7.5 MG PO TABS
15.0000 mg | ORAL_TABLET | Freq: Once | ORAL | Status: AC
  Administered 2022-04-15: 15 mg via ORAL
  Filled 2022-04-15: qty 2

## 2022-04-15 MED ORDER — SACUBITRIL-VALSARTAN 24-26 MG PO TABS
1.0000 | ORAL_TABLET | Freq: Two times a day (BID) | ORAL | Status: DC
Start: 2022-04-15 — End: 2022-04-17
  Administered 2022-04-15 – 2022-04-17 (×4): 1 via ORAL
  Filled 2022-04-15 (×4): qty 1

## 2022-04-15 MED ORDER — SODIUM CHLORIDE 0.9 % WEIGHT BASED INFUSION
1.0000 mL/kg/h | INTRAVENOUS | Status: DC
  Administered 2022-04-16: 1 mL/kg/h via INTRAVENOUS

## 2022-04-15 MED ORDER — NITROGLYCERIN 0.4 MG SL SUBL: 0.8000 mg | SUBLINGUAL_TABLET | Freq: Once | SUBLINGUAL | Status: AC

## 2022-04-15 MED ORDER — SODIUM CHLORIDE 0.9% FLUSH
3.0000 mL | Freq: Two times a day (BID) | INTRAVENOUS | Status: DC
  Administered 2022-04-15: 3 mL via INTRAVENOUS

## 2022-04-15 MED ORDER — ASPIRIN 81 MG PO CHEW
81.0000 mg | CHEWABLE_TABLET | ORAL | Status: AC
  Administered 2022-04-16: 81 mg via ORAL
  Filled 2022-04-15: qty 1

## 2022-04-15 MED ORDER — METOPROLOL TARTRATE 50 MG PO TABS
100.0000 mg | ORAL_TABLET | Freq: Once | ORAL | Status: AC
  Administered 2022-04-15: 100 mg via ORAL
  Filled 2022-04-15: qty 2

## 2022-04-15 MED ORDER — SODIUM CHLORIDE 0.9 % WEIGHT BASED INFUSION
3.0000 mL/kg/h | INTRAVENOUS | Status: DC
  Administered 2022-04-16: 3 mL/kg/h via INTRAVENOUS

## 2022-04-15 MED ORDER — IOHEXOL 350 MG/ML SOLN
95.0000 mL | Freq: Once | INTRAVENOUS | Status: AC | PRN
  Administered 2022-04-15: 95 mL via INTRAVENOUS

## 2022-04-15 MED ORDER — LIDOCAINE-EPINEPHRINE 1 %-1:100000 IJ SOLN
INTRAMUSCULAR | Status: DC | PRN
  Administered 2022-04-15: 30 mL

## 2022-04-15 SURGICAL SUPPLY — 2 items
MONITOR CARDIAC ASSERT IQ EL (Prosthesis & Implant Heart) IMPLANT
PACK LOOP INSERTION (CUSTOM PROCEDURE TRAY) ×2 IMPLANT

## 2022-04-15 NOTE — H&P (View-Only) (Signed)
     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 She understands and agrees to proceed.   Orders written  Pt placed on the add on board.     Mertie Moores, MD  04/15/2022 6:14 PM    [facility],  [address] [place], [place]  [address] Phone: [phone]; Fax: [phone]

## 2022-04-15 NOTE — Progress Notes (Signed)
PROGRESS NOTE    Paula Wong  EQA:[id number] DOB: Feb 09, 1946 DOA: 04/12/2022 PCP: Arthur Holms, NP    Brief Narrative:  Paula Wong is a 76 y.o. female with medical history significant for type 2 diabetes, hypertension, hyperlipidemia, HFrEF 20 to 25%, presented to hospital with abnormal CT scan findings by her primary care physician.  Patient had been having moderate to severe headache for which PCP had ordered a CT scan.  In the ED, patient was noted to have subacute cortical infarct  involving the inferior medial left occipital pole with petechial hemorrhage patient was asked to hospital for further evaluation and treatment.  During hospitalization patient was seen by cardiology for reduced LV function and CT angiogram has been planned.  Patient will likely need loop recorder as well.  She needs to be optimized on heart failure medications as below.  Assessment and plan. Principal Problem:   Acute CVA (cerebrovascular accident) ([place]) Active Problems:   Hypertension   Diabetes ([place])   Hyperlipidemia   Chronic systolic CHF (congestive heart failure) ([place])   Subacute ischemic CVA:  Patient had been having headache prior to presentation.  Improved at this time.  CT scan done outpatient showed edema in the left occipital lobe so MRI was done in the hospital which showed subacute cortical infarct involving the inferior and medial left occipital pole with petechial hemorrhage.  No large vessel occlusion on the CTA of the head and neck.  Hemoglobin A1c of 6.3.  LDL of 70.  2D echocardiogram with LV ejection fraction of 20 to 25% with severely decreased function and global hypokinesis and grade 1 diastolic dysfunction with positive bubble study..  Physical therapy has recommended outpatient PT. on Lipitor and high-dose aspirin..patient might need prolonged cardiac monitoring at discharge.  Neurology recommends aspirin Plavix for 3 weeks followed by aspirin alone    Uncontrolled  hypertension:  Amlodipine was discontinued.  On metoprolol.  We will continue.  Latest blood pressure 130/78  Type 2 diabetes mellitus: Hemoglobin A1c of 6.3.  Currently on sliding scale  insulin.   Patient is on Ozempic, Tresiba, metformin ,farxiga at home.  Resumed farxiga   Chronic systolic congestive heart failure: Compensated.  Of note patient did have 2D echocardiogram on 12/09/2021 which showed LV ejection fraction of 20 to 25% with global hypokinesis.  Patient was not aware of this finding.  Cardiology has been consulted at this time.  Patient was supposed to have a stress test next week as scheduled by PCP.  Please has recommended CT coronaries today.  We will need to finalize medication.  Cardiology   Headache:.  Improved   DVT prophylaxis: enoxaparin (LOVENOX) injection 40 mg Start: 04/13/22 1000   Code Status:     Code Status: Full Code  Disposition: Home Status is: Observation  The patient will require care spanning > 2 midnights and should be moved to inpatient because: Need for coronary CT, CVA, optimizing cardiac medications   Family Communication:   I tried to reach the patient's contact on the phone listed but was unable to reach.  Consultants:  Cardiology Neurology  Procedures:  None  Antimicrobials:  None  Anti-infectives (From admission, onward)    None      Subjective: Today, patient was seen and examined at bedside denies any chest pain, shortness of breath, dizziness, lightheadedness or headache  Objective: Vitals:   04/14/22 2350 04/15/22 0424 04/15/22 0738 04/15/22 1100  BP: 134/72 122/83 133/80 130/78  Pulse: 85 85 84 85  Resp: 18  $'18 16 16  'V$ Temp: 98.2 F (36.8 C) 98.4 F (36.9 C) 98 F (36.7 C) 98.3 F (36.8 C)  TempSrc: Oral Oral Oral Oral  SpO2: 97% 97% 99% 100%  Weight:       No intake or output data in the 24 hours ending 04/15/22 1355 Filed Weights   04/14/22 0500  Weight: 88.3 kg    Physical Examination: Body mass index  is 33.41 kg/m.   General: Obese built, not in obvious distress HENT:   No scleral pallor or icterus noted. Oral mucosa is moist.  Chest:  Clear breath sounds.  Diminished breath sounds bilaterally. No crackles or wheezes.  CVS: S1 &S2 heard. No murmur.  Regular rate and rhythm. Abdomen: Soft, nontender, nondistended.  Bowel sounds are heard.   Extremities: No cyanosis, clubbing or edema.  Peripheral pulses are palpable. Psych: Alert, awake and oriented, normal mood CNS:  No cranial nerve deficits.  Power equal in all extremities.   Skin: Warm and dry.  No rashes noted.  Data Reviewed:   CBC: Recent Labs  Lab 04/12/22 1745 04/13/22 0036 04/13/22 0530 04/14/22 0215 04/15/22 0401  WBC 5.8 6.4 5.9 4.9 4.9  NEUTROABS 2.9  --   --   --   --   HGB 13.5 13.6 12.7 12.6 13.1  HCT 41.0 42.6 39.5 38.5 39.4  MCV 84.2 85.2 84.9 83.3 82.8  PLT 270 272 285 250 256     Basic Metabolic Panel: Recent Labs  Lab 04/12/22 1745 04/13/22 0036 04/13/22 0530 04/14/22 0215 04/14/22 0226 04/15/22 0401  NA 142  --  140 142  --  140  K 3.8  --  3.5 3.6  --  4.2  CL 108  --  109 107  --  107  CO2 24  --  24 24  --  22  GLUCOSE 137*  --  87 85  --  120*  BUN 16  --  12 9  --  13  CREATININE 0.94 0.87 0.72 0.87  --  0.91  CALCIUM 9.8  --  8.9 9.3  --  9.5  MG  --   --   --   --  1.9  --      Liver Function Tests: Recent Labs  Lab 04/12/22 1745  AST 21  ALT 15  ALKPHOS 41  BILITOT 0.9  PROT 7.8  ALBUMIN 4.3      Radiology Studies: VAS [place] LOWER EXTREMITY VENOUS (DVT)  Result Date: 04/14/2022  Lower Venous DVT Study Patient Name:  Paula Wong  Date of Exam:   04/14/2022 Medical Rec #: [id number]         Accession #:    [id number] Date of Birth: 1946-04-16          Patient Gender: F Patient Age:   33 years Exam Location:  [facility] Procedure:      VAS [place] LOWER EXTREMITY VENOUS (DVT) Referring Phys: Janine Ores  --------------------------------------------------------------------------------  Indications: Stroke, and PFO.  Performing Technologist: Sharion Dove RVS  Examination Guidelines: A complete evaluation includes B-mode imaging, spectral Doppler, color Doppler, and power Doppler as needed of all accessible portions of each vessel. Bilateral testing is considered an integral part of a complete examination. Limited examinations for reoccurring indications may be performed as noted. The reflux portion of the exam is performed with the patient in reverse Trendelenburg.  +---------+---------------+---------+-----------+----------+--------------+ RIGHT    CompressibilityPhasicitySpontaneityPropertiesThrombus Aging +---------+---------------+---------+-----------+----------+--------------+ CFV      Full  Yes      Yes                                 +---------+---------------+---------+-----------+----------+--------------+ SFJ      Full                                                        +---------+---------------+---------+-----------+----------+--------------+ FV Prox  Full                                                        +---------+---------------+---------+-----------+----------+--------------+ FV Mid   Full                                                        +---------+---------------+---------+-----------+----------+--------------+ FV DistalFull                                                        +---------+---------------+---------+-----------+----------+--------------+ PFV      Full                                                        +---------+---------------+---------+-----------+----------+--------------+ POP      Full           Yes      Yes                                 +---------+---------------+---------+-----------+----------+--------------+ PTV      Full                                                         +---------+---------------+---------+-----------+----------+--------------+ PERO     Full                                                        +---------+---------------+---------+-----------+----------+--------------+   +---------+---------------+---------+-----------+----------+--------------+ LEFT     CompressibilityPhasicitySpontaneityPropertiesThrombus Aging +---------+---------------+---------+-----------+----------+--------------+ CFV      Full           Yes      Yes                                 +---------+---------------+---------+-----------+----------+--------------+ SFJ  Full                                                        +---------+---------------+---------+-----------+----------+--------------+ FV Prox  Full                                                        +---------+---------------+---------+-----------+----------+--------------+ FV Mid   Full                                                        +---------+---------------+---------+-----------+----------+--------------+ FV DistalFull                                                        +---------+---------------+---------+-----------+----------+--------------+ PFV      Full                                                        +---------+---------------+---------+-----------+----------+--------------+ POP      Full           Yes      Yes                                 +---------+---------------+---------+-----------+----------+--------------+ PTV      Full                                                        +---------+---------------+---------+-----------+----------+--------------+ PERO     Full                                                        +---------+---------------+---------+-----------+----------+--------------+     Summary: BILATERAL: - No evidence of deep vein thrombosis seen in the lower extremities, bilaterally. -No evidence of  popliteal cyst, bilaterally.   *See table(s) above for measurements and observations. Electronically signed by Harold Barban MD on 04/14/2022 at 8:48:30 PM.    Final    ECHOCARDIOGRAM COMPLETE BUBBLE STUDY  Result Date: 04/13/2022    ECHOCARDIOGRAM REPORT   Patient Name:   Paula Wong Date of Exam: 04/13/2022 Medical Rec #:  [id number]        Height:       64.0 in Accession #:    [id number]       Weight:       190.0  lb Date of Birth:  1946-06-03         BSA:          1.914 m Patient Age:    23 years         BP:           114/76 mmHg Patient Gender: F                HR:           90 bpm. Exam Location:  Inpatient Procedure: 2D Echo, Cardiac Doppler, Color Doppler and Saline Contrast Bubble            Study Indications:    Stroke  History:        Patient has prior history of Echocardiogram examinations, most                 recent 12/04/2021. Risk Factors:Hypertension, Diabetes and                 Dyslipidemia.  Sonographer:    Merrie Roof RDCS Referring Phys: [place]  1. Left ventricular ejection fraction, by estimation, is 20 to 25%. Left ventricular ejection fraction by 2D MOD biplane is 21.5 %. The left ventricle has severely decreased function. The left ventricle demonstrates global hypokinesis. There is mild left ventricular hypertrophy. Left ventricular diastolic parameters are consistent with Grade I diastolic dysfunction (impaired relaxation). There is incoordinate septal motion.  2. Right ventricular systolic function is low normal. The right ventricular size is normal. Tricuspid regurgitation signal is inadequate for assessing PA pressure.  3. The mitral valve is abnormal. Mild mitral valve regurgitation.  4. The aortic valve is tricuspid. Aortic valve regurgitation is trivial. Aortic valve sclerosis is present, with no evidence of aortic valve stenosis.  5. Aortic dilatation noted. There is mild dilatation of the aortic root, measuring 40 mm.  6. The inferior vena cava is normal in  size with greater than 50% respiratory variability, suggesting right atrial pressure of 3 mmHg.  7. Agitated saline contrast bubble study was positive with shunting observed after >6 cardiac cycles suggestive of intrapulmonary shunting. No atrial level shunting was noted. Comparison(s): No significant change from prior study. 12/04/2021: LVEF 20-25%. FINDINGS  Left Ventricle: Left ventricular ejection fraction, by estimation, is 20 to 25%. Left ventricular ejection fraction by 2D MOD biplane is 21.5 %. The left ventricle has severely decreased function. The left ventricle demonstrates global hypokinesis. The left ventricular internal cavity size was normal in size. There is mild left ventricular hypertrophy. Incoordinate septal motion. Left ventricular diastolic parameters are consistent with Grade I diastolic dysfunction (impaired relaxation). Indeterminate  filling pressures. Right Ventricle: The right ventricular size is normal. No increase in right ventricular wall thickness. Right ventricular systolic function is low normal. Tricuspid regurgitation signal is inadequate for assessing PA pressure. Left Atrium: Left atrial size was normal in size. Right Atrium: Right atrial size was normal in size. Pericardium: There is no evidence of pericardial effusion. Mitral Valve: The mitral valve is abnormal. Mild mitral annular calcification. Mild mitral valve regurgitation. Tricuspid Valve: The tricuspid valve is grossly normal. Tricuspid valve regurgitation is trivial. Aortic Valve: The aortic valve is tricuspid. Aortic valve regurgitation is trivial. Aortic valve sclerosis is present, with no evidence of aortic valve stenosis. Aortic valve mean gradient measures 2.0 mmHg. Aortic valve peak gradient measures 3.3 mmHg. Aortic valve area, by VTI measures 2.12 cm. Pulmonic Valve: The pulmonic valve was grossly normal. Pulmonic valve regurgitation is trivial.  Aorta: Aortic dilatation noted. There is mild dilatation of the  aortic root, measuring 40 mm. Venous: The inferior vena cava is normal in size with greater than 50% respiratory variability, suggesting right atrial pressure of 3 mmHg. IAS/Shunts: No atrial level shunt detected by color flow Doppler. Agitated saline contrast was given intravenously to evaluate for intracardiac shunting. Agitated saline contrast bubble study was positive with shunting observed after >6 cardiac cycles suggestive of intrapulmonary shunting.  LEFT VENTRICLE PLAX 2D                        Biplane EF (MOD) LVIDd:         5.30 cm         LV Biplane EF:   Left LVIDs:         4.80 cm                          ventricular LV PW:         1.20 cm                          ejection LV IVS:        1.20 cm                          fraction by LVOT diam:     2.10 cm                          2D MOD LV SV:         35                               biplane is LV SV Index:   18                               21.5 %. LVOT Area:     3.46 cm                                Diastology                                LV e' medial:    2.76 cm/s LV Volumes (MOD)               LV E/e' medial:  19.4 LV vol d, MOD    143.0 ml      LV e' lateral:   2.76 cm/s A2C:                           LV E/e' lateral: 19.4 LV vol d, MOD    147.0 ml A4C: LV vol s, MOD    112.0 ml A2C: LV vol s, MOD    110.0 ml A4C: LV SV MOD A2C:   31.0 ml LV SV MOD A4C:   147.0 ml LV SV MOD BP:    32.1 ml RIGHT VENTRICLE RV Basal diam:  3.10 cm RV S prime:     10.70 cm/s TAPSE (M-mode): 1.4 cm LEFT ATRIUM  Index        RIGHT ATRIUM           Index LA diam:        3.00 cm 1.57 cm/m   RA Area:     12.70 cm LA Vol (A2C):   72.7 ml 37.98 ml/m  RA Volume:   30.70 ml  16.04 ml/m LA Vol (A4C):   43.9 ml 22.93 ml/m LA Biplane Vol: 57.0 ml 29.78 ml/m  AORTIC VALVE AV Area (Vmax):    2.34 cm AV Area (Vmean):   2.19 cm AV Area (VTI):     2.12 cm AV Vmax:           91.40 cm/s AV Vmean:          62.600 cm/s AV VTI:            0.167 m AV Peak Grad:       3.3 mmHg AV Mean Grad:      2.0 mmHg LVOT Vmax:         61.80 cm/s LVOT Vmean:        39.500 cm/s LVOT VTI:          0.102 m LVOT/AV VTI ratio: 0.61  AORTA Ao Root diam: 4.00 cm Ao Asc diam:  3.20 cm MITRAL VALVE MV Area (PHT): 3.72 cm     SHUNTS MV Decel Time: 204 msec     Systemic VTI:  0.10 m MV E velocity: 53.50 cm/s   Systemic Diam: 2.10 cm MV A velocity: 102.00 cm/s MV E/A ratio:  0.52 Lyman Bishop MD Electronically signed by Lyman Bishop MD Signature Date/Time: 04/13/2022/4:49:00 PM    Final       LOS: 0 days    Flora Lipps, MD [facility] Available via Epic secure chat 7am-7pm After these hours, please refer to coverage provider listed on [email or web address] 04/15/2022, 1:55 PM

## 2022-04-15 NOTE — Progress Notes (Signed)
Rounding Note    Patient Name: Paula Wong Date of Encounter: 04/15/2022  [facility] Cardiologist:  Quentin Ore   Subjective    Pt is a 76 yo with recent dx of acute on chronic combined CHF, and CVA  Echo 11/2021 shows EF 20-25% Had a recent CVA  She is comfortable,  no recent CP  Recovering nicely from her CVA   She is scheduled for a coronary CTA today followed by an implantable loop recorder.     Inpatient Medications    Scheduled Meds:  aspirin  81 mg Oral Daily   atorvastatin  80 mg Oral Daily   clopidogrel  75 mg Oral Daily   dapagliflozin propanediol  10 mg Oral Daily   enoxaparin (LOVENOX) injection  40 mg Subcutaneous Q24H   gabapentin  100 mg Oral BID   insulin aspart  0-9 Units Subcutaneous TID AC & HS   magnesium oxide  400 mg Oral BID   metoprolol succinate  75 mg Oral BID   pantoprazole  20 mg Oral Daily   spironolactone  12.5 mg Oral Daily   Continuous Infusions:  PRN Meds: acetaminophen, melatonin, prochlorperazine   Vital Signs    Vitals:   04/14/22 2350 04/15/22 0424 04/15/22 0738 04/15/22 1100  BP: 134/72 122/83 133/80 130/78  Pulse: 85 85 84 85  Resp: '18 18 16 16  '$ Temp: 98.2 F (36.8 C) 98.4 F (36.9 C) 98 F (36.7 C) 98.3 F (36.8 C)  TempSrc: Oral Oral Oral Oral  SpO2: 97% 97% 99% 100%  Weight:       No intake or output data in the 24 hours ending 04/15/22 1335    04/14/2022    5:00 AM 08/24/2021    8:41 AM 06/27/2020    9:40 AM  Last 3 Weights  Weight (lbs) 194 lb 10.7 oz 190 lb 183 lb  Weight (kg) 88.3 kg 86.183 kg 83.008 kg      Telemetry    SR at 81, occasional PVC - Personally Reviewed  ECG     - Personally Reviewed  Physical Exam   GEN: elderly female, NAD  Neck: No JVD Cardiac: RRR, no murmurs, rubs, or gallops.  Respiratory: Clear to auscultation bilaterally. GI: Soft, nontender, non-distended  MS: No edema; No deformity. Neuro:  Nonfocal  Psych: Normal affect   Labs    High Sensitivity  Troponin:  No results for input(s): "TROPONINIHS" in the last 720 hours.   Chemistry Recent Labs  Lab 04/12/22 1745 04/13/22 0036 04/13/22 0530 04/14/22 0215 04/14/22 0226 04/15/22 0401  NA 142  --  140 142  --  140  K 3.8  --  3.5 3.6  --  4.2  CL 108  --  109 107  --  107  CO2 24  --  24 24  --  22  GLUCOSE 137*  --  87 85  --  120*  BUN 16  --  12 9  --  13  CREATININE 0.94   < > 0.72 0.87  --  0.91  CALCIUM 9.8  --  8.9 9.3  --  9.5  MG  --   --   --   --  1.9  --   PROT 7.8  --   --   --   --   --   ALBUMIN 4.3  --   --   --   --   --   AST 21  --   --   --   --   --  ALT 15  --   --   --   --   --   ALKPHOS 41  --   --   --   --   --   BILITOT 0.9  --   --   --   --   --   GFRNONAA >60   < > >60 >60  --  >60  ANIONGAP 10  --  7 11  --  11   < > = values in this interval not displayed.    Lipids  Recent Labs  Lab 04/13/22 0500  CHOL 121  TRIG 82  HDL 35*  LDLCALC 70  CHOLHDL 3.5    Hematology Recent Labs  Lab 04/13/22 0530 04/14/22 0215 04/15/22 0401  WBC 5.9 4.9 4.9  RBC 4.65 4.62 4.76  HGB 12.7 12.6 13.1  HCT 39.5 38.5 39.4  MCV 84.9 83.3 82.8  MCH 27.3 27.3 27.5  MCHC 32.2 32.7 33.2  RDW 14.1 13.7 13.7  PLT 285 250 256   Thyroid No results for input(s): "TSH", "FREET4" in the last 168 hours.  BNPNo results for input(s): "BNP", "PROBNP" in the last 168 hours.  DDimer No results for input(s): "DDIMER" in the last 168 hours.   Radiology    VAS [place] LOWER EXTREMITY VENOUS (DVT)  Result Date: 04/14/2022  Lower Venous DVT Study Patient Name:  ZYONA PETTAWAY  Date of Exam:   04/14/2022 Medical Rec #: [id number]         Accession #:    [id number] Date of Birth: July 23, 1946          Patient Gender: F Patient Age:   57 years Exam Location:  [facility] Procedure:      VAS [place] LOWER EXTREMITY VENOUS (DVT) Referring Phys: Janine Ores --------------------------------------------------------------------------------  Indications: Stroke, and PFO.   Performing Technologist: Sharion Dove RVS  Examination Guidelines: A complete evaluation includes B-mode imaging, spectral Doppler, color Doppler, and power Doppler as needed of all accessible portions of each vessel. Bilateral testing is considered an integral part of a complete examination. Limited examinations for reoccurring indications may be performed as noted. The reflux portion of the exam is performed with the patient in reverse Trendelenburg.  +---------+---------------+---------+-----------+----------+--------------+ RIGHT    CompressibilityPhasicitySpontaneityPropertiesThrombus Aging +---------+---------------+---------+-----------+----------+--------------+ CFV      Full           Yes      Yes                                 +---------+---------------+---------+-----------+----------+--------------+ SFJ      Full                                                        +---------+---------------+---------+-----------+----------+--------------+ FV Prox  Full                                                        +---------+---------------+---------+-----------+----------+--------------+ FV Mid   Full                                                        +---------+---------------+---------+-----------+----------+--------------+  FV DistalFull                                                        +---------+---------------+---------+-----------+----------+--------------+ PFV      Full                                                        +---------+---------------+---------+-----------+----------+--------------+ POP      Full           Yes      Yes                                 +---------+---------------+---------+-----------+----------+--------------+ PTV      Full                                                        +---------+---------------+---------+-----------+----------+--------------+ PERO     Full                                                         +---------+---------------+---------+-----------+----------+--------------+   +---------+---------------+---------+-----------+----------+--------------+ LEFT     CompressibilityPhasicitySpontaneityPropertiesThrombus Aging +---------+---------------+---------+-----------+----------+--------------+ CFV      Full           Yes      Yes                                 +---------+---------------+---------+-----------+----------+--------------+ SFJ      Full                                                        +---------+---------------+---------+-----------+----------+--------------+ FV Prox  Full                                                        +---------+---------------+---------+-----------+----------+--------------+ FV Mid   Full                                                        +---------+---------------+---------+-----------+----------+--------------+ FV DistalFull                                                        +---------+---------------+---------+-----------+----------+--------------+  PFV      Full                                                        +---------+---------------+---------+-----------+----------+--------------+ POP      Full           Yes      Yes                                 +---------+---------------+---------+-----------+----------+--------------+ PTV      Full                                                        +---------+---------------+---------+-----------+----------+--------------+ PERO     Full                                                        +---------+---------------+---------+-----------+----------+--------------+     Summary: BILATERAL: - No evidence of deep vein thrombosis seen in the lower extremities, bilaterally. -No evidence of popliteal cyst, bilaterally.   *See table(s) above for measurements and observations. Electronically signed by Harold Barban MD on 04/14/2022 at 8:48:30 PM.    Final    ECHOCARDIOGRAM COMPLETE BUBBLE STUDY  Result Date: 04/13/2022    ECHOCARDIOGRAM REPORT   Patient Name:   Vlada Uriostegui Date of Exam: 04/13/2022 Medical Rec #:  [id number]        Height:       64.0 in Accession #:    [id number]       Weight:       190.0 lb Date of Birth:  10-May-1946         BSA:          1.914 m Patient Age:    38 years         BP:           114/76 mmHg Patient Gender: F                HR:           90 bpm. Exam Location:  Inpatient Procedure: 2D Echo, Cardiac Doppler, Color Doppler and Saline Contrast Bubble            Study Indications:    Stroke  History:        Patient has prior history of Echocardiogram examinations, most                 recent 12/04/2021. Risk Factors:Hypertension, Diabetes and                 Dyslipidemia.  Sonographer:    Merrie Roof RDCS Referring Phys: [place]  1. Left ventricular ejection fraction, by estimation, is 20 to 25%. Left ventricular ejection fraction by 2D MOD biplane is 21.5 %. The left ventricle has severely decreased function. The left ventricle demonstrates global hypokinesis. There is mild left ventricular hypertrophy. Left ventricular diastolic parameters are consistent with  Grade I diastolic dysfunction (impaired relaxation). There is incoordinate septal motion.  2. Right ventricular systolic function is low normal. The right ventricular size is normal. Tricuspid regurgitation signal is inadequate for assessing PA pressure.  3. The mitral valve is abnormal. Mild mitral valve regurgitation.  4. The aortic valve is tricuspid. Aortic valve regurgitation is trivial. Aortic valve sclerosis is present, with no evidence of aortic valve stenosis.  5. Aortic dilatation noted. There is mild dilatation of the aortic root, measuring 40 mm.  6. The inferior vena cava is normal in size with greater than 50% respiratory variability, suggesting right atrial pressure of 3 mmHg.  7. Agitated saline  contrast bubble study was positive with shunting observed after >6 cardiac cycles suggestive of intrapulmonary shunting. No atrial level shunting was noted. Comparison(s): No significant change from prior study. 12/04/2021: LVEF 20-25%. FINDINGS  Left Ventricle: Left ventricular ejection fraction, by estimation, is 20 to 25%. Left ventricular ejection fraction by 2D MOD biplane is 21.5 %. The left ventricle has severely decreased function. The left ventricle demonstrates global hypokinesis. The left ventricular internal cavity size was normal in size. There is mild left ventricular hypertrophy. Incoordinate septal motion. Left ventricular diastolic parameters are consistent with Grade I diastolic dysfunction (impaired relaxation). Indeterminate  filling pressures. Right Ventricle: The right ventricular size is normal. No increase in right ventricular wall thickness. Right ventricular systolic function is low normal. Tricuspid regurgitation signal is inadequate for assessing PA pressure. Left Atrium: Left atrial size was normal in size. Right Atrium: Right atrial size was normal in size. Pericardium: There is no evidence of pericardial effusion. Mitral Valve: The mitral valve is abnormal. Mild mitral annular calcification. Mild mitral valve regurgitation. Tricuspid Valve: The tricuspid valve is grossly normal. Tricuspid valve regurgitation is trivial. Aortic Valve: The aortic valve is tricuspid. Aortic valve regurgitation is trivial. Aortic valve sclerosis is present, with no evidence of aortic valve stenosis. Aortic valve mean gradient measures 2.0 mmHg. Aortic valve peak gradient measures 3.3 mmHg. Aortic valve area, by VTI measures 2.12 cm. Pulmonic Valve: The pulmonic valve was grossly normal. Pulmonic valve regurgitation is trivial. Aorta: Aortic dilatation noted. There is mild dilatation of the aortic root, measuring 40 mm. Venous: The inferior vena cava is normal in size with greater than 50% respiratory  variability, suggesting right atrial pressure of 3 mmHg. IAS/Shunts: No atrial level shunt detected by color flow Doppler. Agitated saline contrast was given intravenously to evaluate for intracardiac shunting. Agitated saline contrast bubble study was positive with shunting observed after >6 cardiac cycles suggestive of intrapulmonary shunting.  LEFT VENTRICLE PLAX 2D                        Biplane EF (MOD) LVIDd:         5.30 cm         LV Biplane EF:   Left LVIDs:         4.80 cm                          ventricular LV PW:         1.20 cm                          ejection LV IVS:        1.20 cm  fraction by LVOT diam:     2.10 cm                          2D MOD LV SV:         35                               biplane is LV SV Index:   18                               21.5 %. LVOT Area:     3.46 cm                                Diastology                                LV e' medial:    2.76 cm/s LV Volumes (MOD)               LV E/e' medial:  19.4 LV vol d, MOD    143.0 ml      LV e' lateral:   2.76 cm/s A2C:                           LV E/e' lateral: 19.4 LV vol d, MOD    147.0 ml A4C: LV vol s, MOD    112.0 ml A2C: LV vol s, MOD    110.0 ml A4C: LV SV MOD A2C:   31.0 ml LV SV MOD A4C:   147.0 ml LV SV MOD BP:    32.1 ml RIGHT VENTRICLE RV Basal diam:  3.10 cm RV S prime:     10.70 cm/s TAPSE (M-mode): 1.4 cm LEFT ATRIUM             Index        RIGHT ATRIUM           Index LA diam:        3.00 cm 1.57 cm/m   RA Area:     12.70 cm LA Vol (A2C):   72.7 ml 37.98 ml/m  RA Volume:   30.70 ml  16.04 ml/m LA Vol (A4C):   43.9 ml 22.93 ml/m LA Biplane Vol: 57.0 ml 29.78 ml/m  AORTIC VALVE AV Area (Vmax):    2.34 cm AV Area (Vmean):   2.19 cm AV Area (VTI):     2.12 cm AV Vmax:           91.40 cm/s AV Vmean:          62.600 cm/s AV VTI:            0.167 m AV Peak Grad:      3.3 mmHg AV Mean Grad:      2.0 mmHg LVOT Vmax:         61.80 cm/s LVOT Vmean:        39.500 cm/s LVOT VTI:           0.102 m LVOT/AV VTI ratio: 0.61  AORTA Ao Root diam: 4.00 cm Ao Asc diam:  3.20 cm MITRAL VALVE MV Area (PHT): 3.72 cm     SHUNTS MV Decel Time: 204 msec  Systemic VTI:  0.10 m MV E velocity: 53.50 cm/s   Systemic Diam: 2.10 cm MV A velocity: 102.00 cm/s MV E/A ratio:  0.52 Lyman Bishop MD Electronically signed by Lyman Bishop MD Signature Date/Time: 04/13/2022/4:49:00 PM    Final     Cardiac Studies      Patient Profile     76 y.o. female  with chronic combined CHF, new CVA   Assessment & Plan     Chronic combined CHF:   she is scheduled for a coronary CTA today to look for any evidence of CAD .   He are having difficulty in getting her HR slow enough  Has received metoprolol, ivabradine 15 mg   On Toprol XL 75 BID  Is not on any other CHF meds yet  Add entresto tonight 24-26 BID        For questions or updates, please contact [place] Please consult [email or web address] for contact info under        Signed, Mertie Moores, MD  04/15/2022, 1:35 PM

## 2022-04-15 NOTE — Progress Notes (Signed)
     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 She understands and agrees to proceed.   Orders written  Pt placed on the add on board.     Mertie Moores, MD  04/15/2022 6:14 PM    [facility] [address],  [address] [place], [place]  [address] Phone: [phone]; Fax: [phone]

## 2022-04-15 NOTE — Discharge Instructions (Signed)
Post procedure wound care instructions Keep incision clean and dry for 3 days. You can remove outer dressing tomorrow. Leave steri-strips (little pieces of tape) on until seen in the office for wound check appointment. Call the office ([phone]) for redness, drainage, swelling, or fever.  

## 2022-04-15 NOTE — TOC Transition Note (Signed)
Transition of Care [facility]) - CM/SW Discharge Note   Patient Details  Name: [name] MRN: [id number] Date of Birth: 1946/06/13  Transition of Care [facility] CM/SW Contact:  Cyndi Bender, RN Phone Number: 04/15/2022, 12:10 PM   Clinical Narrative:     Spoke to patient regarding OP rehab referral. Patient states she is functioning at her baseline and doesn't feel like she needs OP Rehab. TOC will sign off.  Final next level of care: Home/Self Care Barriers to Discharge: Barriers Resolved   Patient Goals and CMS Choice        Discharge Placement                   home    Discharge Plan and Services               home                      Social Determinants of Health (SDOH) Interventions     Readmission Risk Interventions     No data to display

## 2022-04-15 NOTE — Progress Notes (Addendum)
Rounding Note    Patient Name: Paula Wong Date of Encounter: 04/15/2022  [facility] Cardiologist: None   Subjective   Feels good, wants to go home  Inpatient Medications    Scheduled Meds:  aspirin  81 mg Oral Daily   atorvastatin  80 mg Oral Daily   clopidogrel  75 mg Oral Daily   dapagliflozin propanediol  10 mg Oral Daily   enoxaparin (LOVENOX) injection  40 mg Subcutaneous Q24H   gabapentin  100 mg Oral BID   insulin aspart  0-9 Units Subcutaneous TID AC & HS   ivabradine  15 mg Oral Once   magnesium oxide  400 mg Oral BID   metoprolol succinate  75 mg Oral BID   pantoprazole  20 mg Oral Daily   spironolactone  12.5 mg Oral Daily   Continuous Infusions:  PRN Meds: acetaminophen, melatonin, prochlorperazine   Vital Signs    Vitals:   04/14/22 2350 04/15/22 0424 04/15/22 0738 04/15/22 1100  BP: 134/72 122/83 133/80 130/78  Pulse: 85 85 84 85  Resp: '18 18 16 16  '$ Temp: 98.2 F (36.8 C) 98.4 F (36.9 C) 98 F (36.7 C) 98.3 F (36.8 C)  TempSrc: Oral Oral Oral Oral  SpO2: 97% 97% 99% 100%  Weight:       No intake or output data in the 24 hours ending 04/15/22 1217    04/14/2022    5:00 AM 08/24/2021    8:41 AM 06/27/2020    9:40 AM  Last 3 Weights  Weight (lbs) 194 lb 10.7 oz 190 lb 183 lb  Weight (kg) 88.3 kg 86.183 kg 83.008 kg      Telemetry    SR, occ PVCs, PACs, infrequent short NSVT, no AFib- Personally Reviewed  ECG    All reviewed, no AFib - Personally Reviewed  Physical Exam   GEN: No acute distress.   Neck: No JVD Cardiac: RRR, no murmurs, rubs, or gallops.  Respiratory: CTA b/l GI: Soft, nontender, non-distended  MS: No edema; No deformity. Neuro:  Nonfocal  Psych: Normal affect   Labs    High Sensitivity Troponin:  No results for input(s): "TROPONINIHS" in the last 720 hours.   Chemistry Recent Labs  Lab 04/12/22 1745 04/13/22 0036 04/13/22 0530 04/14/22 0215 04/14/22 0226 04/15/22 0401  NA 142  --   140 142  --  140  K 3.8  --  3.5 3.6  --  4.2  CL 108  --  109 107  --  107  CO2 24  --  24 24  --  22  GLUCOSE 137*  --  87 85  --  120*  BUN 16  --  12 9  --  13  CREATININE 0.94   < > 0.72 0.87  --  0.91  CALCIUM 9.8  --  8.9 9.3  --  9.5  MG  --   --   --   --  1.9  --   PROT 7.8  --   --   --   --   --   ALBUMIN 4.3  --   --   --   --   --   AST 21  --   --   --   --   --   ALT 15  --   --   --   --   --   ALKPHOS 41  --   --   --   --   --  BILITOT 0.9  --   --   --   --   --   GFRNONAA >60   < > >60 >60  --  >60  ANIONGAP 10  --  7 11  --  11   < > = values in this interval not displayed.    Lipids  Recent Labs  Lab 04/13/22 0500  CHOL 121  TRIG 82  HDL 35*  LDLCALC 70  CHOLHDL 3.5    Hematology Recent Labs  Lab 04/13/22 0530 04/14/22 0215 04/15/22 0401  WBC 5.9 4.9 4.9  RBC 4.65 4.62 4.76  HGB 12.7 12.6 13.1  HCT 39.5 38.5 39.4  MCV 84.9 83.3 82.8  MCH 27.3 27.3 27.5  MCHC 32.2 32.7 33.2  RDW 14.1 13.7 13.7  PLT 285 250 256   Thyroid No results for input(s): "TSH", "FREET4" in the last 168 hours.  BNPNo results for input(s): "BNP", "PROBNP" in the last 168 hours.  DDimer No results for input(s): "DDIMER" in the last 168 hours.   Radiology     Cardiac Studies   Echocardiogram: 04/13/2022 IMPRESSIONS   1. Left ventricular ejection fraction, by estimation, is 20 to 25%. Left  ventricular ejection fraction by 2D MOD biplane is 21.5 %. The left  ventricle has severely decreased function. The left ventricle demonstrates  global hypokinesis. There is mild  left ventricular hypertrophy. Left ventricular diastolic parameters are  consistent with Grade I diastolic dysfunction (impaired relaxation). There  is incoordinate septal motion.   2. Right ventricular systolic function is low normal. The right  ventricular size is normal. Tricuspid regurgitation signal is inadequate  for assessing PA pressure.   3. The mitral valve is abnormal. Mild mitral valve  regurgitation.   4. The aortic valve is tricuspid. Aortic valve regurgitation is trivial.  Aortic valve sclerosis is present, with no evidence of aortic valve  stenosis.   5. Aortic dilatation noted. There is mild dilatation of the aortic root,  measuring 40 mm.   6. The inferior vena cava is normal in size with greater than 50%  respiratory variability, suggesting right atrial pressure of 3 mmHg.   7. Agitated saline contrast bubble study was positive with shunting  observed after >6 cardiac cycles suggestive of intrapulmonary shunting. No  atrial level shunting was noted.   Comparison(s): No significant change from prior study. 12/04/2021: LVEF  20-25%.   Patient Profile     76 y.o. female PMH of HFrEF (EF 20-25% by echo in 11/2021), HTN, HLD, Type 2 DM, prior tobacco use and family history of CAD who is currently admitted with a subacute CVA  Assessment & Plan     1. Subacute CVA, cryptogenic Negative LE venous dopplers Echo as above Stroke Team following, requests consideration for loop  Dr. Quentin Ore has seen the patient, discussed rational for loop implant, the procedure, and potential complications, benefits. She would like to proceed    Pending CTa coronaries 2/2 reduced LVEF General cardiology is following  For questions or updates, please contact [place] Please consult [email or web address] for contact info under        Signed, Baldwin [place], PA-C  04/15/2022, 12:17 PM

## 2022-04-15 NOTE — Progress Notes (Signed)
STROKE TEAM PROGRESS NOTE   INTERVAL HISTORY Seen sitting in the chair.  Headache resolved.  No complaints.  Lower extremity venous Dopplers negative for DVT.  Patient is agreeable to a loop recorder.  Vital signs stable.  Neurological exam unchanged    Vitals:   04/14/22 2350 04/15/22 0424 04/15/22 0738 04/15/22 1100  BP: 134/72 122/83 133/80 130/78  Pulse: 85 85 84 85  Resp: '18 18 16 16  '$ Temp: 98.2 F (36.8 C) 98.4 F (36.9 C) 98 F (36.7 C) 98.3 F (36.8 C)  TempSrc: Oral Oral Oral Oral  SpO2: 97% 97% 99% 100%  Weight:       CBC:  Recent Labs  Lab 04/12/22 1745 04/13/22 0036 04/14/22 0215 04/15/22 0401  WBC 5.8   < > 4.9 4.9  NEUTROABS 2.9  --   --   --   HGB 13.5   < > 12.6 13.1  HCT 41.0   < > 38.5 39.4  MCV 84.2   < > 83.3 82.8  PLT 270   < > 250 256   < > = values in this interval not displayed.   Basic Metabolic Panel:  Recent Labs  Lab 04/14/22 0215 04/14/22 0226 04/15/22 0401  NA 142  --  140  K 3.6  --  4.2  CL 107  --  107  CO2 24  --  22  GLUCOSE 85  --  120*  BUN 9  --  13  CREATININE 0.87  --  0.91  CALCIUM 9.3  --  9.5  MG  --  1.9  --    Lipid Panel:  Recent Labs  Lab 04/13/22 0500  CHOL 121  TRIG 82  HDL 35*  CHOLHDL 3.5  VLDL 16  LDLCALC 70   HgbA1c:  Recent Labs  Lab 04/13/22 0036  HGBA1C 6.3*   Urine Drug Screen:  Recent Labs  Lab 04/12/22 2200  LABOPIA NONE DETECTED  COCAINSCRNUR NONE DETECTED  LABBENZ NONE DETECTED  AMPHETMU NONE DETECTED  THCU NONE DETECTED  LABBARB NONE DETECTED    Alcohol Level  Recent Labs  Lab 04/13/22 0523  ETH <10    IMAGING past 24 hours No results found.  PHYSICAL EXAM  Physical Exam  Constitutional: Appears well-developed and well-nourished elderly lady.   Cardiovascular: Normal rate and regular rhythm.  Respiratory: Effort normal, non-labored breathing  Neuro: Mental Status: Patient is awake, alert, oriented to person, place, month, year, and situation. Patient is able  to give a clear and coherent history. No signs of aphasia or neglect Cranial Nerves: II: Visual Fields are full. Pupils are equal, round, and reactive to light.  Subjective peripheral vision loss on the right, however accurate with visual field testing III,IV, VI: EOMI without ptosis or diploplia.  V: Facial sensation is symmetric to temperature VII: Facial movement is symmetric resting and smiling VIII: Hearing is intact to voice X: Palate elevates symmetrically XI: Shoulder shrug is symmetric. XII: Tongue protrudes midline without atrophy or fasciculations.  Motor: Tone is normal. Bulk is normal. 5/5 strength was present in all four extremities.  Sensory: Sensation is symmetric to light touch and temperature in the arms and legs. No extinction to DSS present.  Cerebellar: FNF and HKS are intact bilaterally    ASSESSMENT/PLAN Paula Wong is a 76 y.o. female with history of DM, HTN, HLD, HFrEF presenting after an abnormal outpatient head CT. Patient states on Sunday she awoke at 0400 with a headache on the top of  her head and then pain ran down the left side of her face. She then called her PCP on Monday and ordered a CT head that was scheduled for Thursday. On Friday evening she received a call from her PCP recommending her to go to the ED for further evaluation. Ct head with edema in the left occipital lobe concern for Left PCA infarct.   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 2D Echo EF 20-25%, severe left global hypokinesis, positive bubble study  LDL 70 HgbA1c 6.3 VTE prophylaxis - lovenox    Diet   Diet heart healthy/carb modified Room service appropriate? Yes; Fluid consistency: Thin   No antithrombotic prior to  admission, now on aspirin 81 mg daily and clopidogrel 75 mg daily.  For 3 weeks followed by aspirin alone Therapy recommendations:  Outpatient OT  Disposition:  pending  Hypertension Home meds:  metoprolol, hydrochlorothiazide Stable Permissive hypertension (OK if < 220/120) but gradually normalize in 5-7 days Long-term BP goal normotensive  Hyperlipidemia Home meds:  pravastatin '20mg'$ , resumed in hospital LDL 70, goal < 70 Continue statin at discharge  PFO Positive bubble study on echo Venous duplex pending Consider cardiac monitoring long term roPE Score- 2 HTN, DM, Stroke, non smoker, cortical infarct on imaging  Diabetes type II Controlled Home meds:  Tresiba, ozempic, metformin HgbA1c 6.3, goal < 7.0 CBGs Recent Labs    04/14/22 2110 04/15/22 0604 04/15/22 1159  GLUCAP 103* 98 142*    SSI  Other Stroke Risk Factors Advanced Age >/= 51  Obesity, Body mass index is 33.41 kg/m., BMI >/= 30 associated with increased stroke risk, recommend weight loss, diet and exercise as appropriate  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  2D echo shows diminished ejection fraction but no definite clot with positive right to left shunt.   However lower extremity venous Dopplers are negative for DVT and she may need prolonged cardiac monitoring at discharge for paroxysmal A-fib.  Recommend aspirin and Plavix for 3 weeks followed by aspirin alone and aggressive risk factor modification.  Greater than 50% time during this 35-minute visit was spent in counseling and coordination of care about her PCA infarct and discussion about stroke evaluation and treatment and answering questions.  Stroke team will sign off.   Follow-up as an outpatient stroke clinic in 2 months.  Can you call for questions if any  Antony Contras, MD Medical Director [facility] Pager: [phone] 04/15/2022 2:15 PM   To contact Stroke Continuity provider, please refer to [email or web address] After hours, contact General Neurology

## 2022-04-16 ENCOUNTER — Encounter (HOSPITAL_COMMUNITY)
Admission: EM | Disposition: A | Discharge status: Home / Self Care | Payer: Self-pay | Source: Home / Self Care | Attending: Internal Medicine

## 2022-04-16 ENCOUNTER — Encounter (HOSPITAL_COMMUNITY): Payer: Self-pay | Admitting: Cardiology

## 2022-04-16 DIAGNOSIS — Z79899 Other long term (current) drug therapy: Secondary | ICD-10-CM | POA: Diagnosis not present

## 2022-04-16 DIAGNOSIS — I5022 Chronic systolic (congestive) heart failure: Secondary | ICD-10-CM | POA: Diagnosis not present

## 2022-04-16 DIAGNOSIS — I110 Hypertensive heart disease with heart failure: Secondary | ICD-10-CM | POA: Diagnosis present

## 2022-04-16 DIAGNOSIS — I63532 Cerebral infarction due to unspecified occlusion or stenosis of left posterior cerebral artery: Secondary | ICD-10-CM | POA: Diagnosis present

## 2022-04-16 DIAGNOSIS — I493 Ventricular premature depolarization: Secondary | ICD-10-CM | POA: Diagnosis present

## 2022-04-16 DIAGNOSIS — I2510 Atherosclerotic heart disease of native coronary artery without angina pectoris: Secondary | ICD-10-CM

## 2022-04-16 DIAGNOSIS — Z7984 Long term (current) use of oral hypoglycemic drugs: Secondary | ICD-10-CM | POA: Diagnosis not present

## 2022-04-16 DIAGNOSIS — Z87891 Personal history of nicotine dependence: Secondary | ICD-10-CM | POA: Diagnosis not present

## 2022-04-16 DIAGNOSIS — R931 Abnormal findings on diagnostic imaging of heart and coronary circulation: Secondary | ICD-10-CM

## 2022-04-16 DIAGNOSIS — R29702 NIHSS score 2: Secondary | ICD-10-CM | POA: Diagnosis present

## 2022-04-16 DIAGNOSIS — I639 Cerebral infarction, unspecified: Secondary | ICD-10-CM | POA: Diagnosis not present

## 2022-04-16 DIAGNOSIS — Z808 Family history of malignant neoplasm of other organs or systems: Secondary | ICD-10-CM | POA: Diagnosis not present

## 2022-04-16 DIAGNOSIS — I77810 Thoracic aortic ectasia: Secondary | ICD-10-CM | POA: Diagnosis present

## 2022-04-16 DIAGNOSIS — K219 Gastro-esophageal reflux disease without esophagitis: Secondary | ICD-10-CM | POA: Diagnosis present

## 2022-04-16 DIAGNOSIS — E785 Hyperlipidemia, unspecified: Secondary | ICD-10-CM | POA: Diagnosis present

## 2022-04-16 DIAGNOSIS — Q2112 Patent foramen ovale: Secondary | ICD-10-CM | POA: Diagnosis not present

## 2022-04-16 DIAGNOSIS — E1140 Type 2 diabetes mellitus with diabetic neuropathy, unspecified: Secondary | ICD-10-CM | POA: Diagnosis present

## 2022-04-16 DIAGNOSIS — Z791 Long term (current) use of non-steroidal anti-inflammatories (NSAID): Secondary | ICD-10-CM | POA: Diagnosis not present

## 2022-04-16 DIAGNOSIS — H538 Other visual disturbances: Secondary | ICD-10-CM | POA: Diagnosis present

## 2022-04-16 DIAGNOSIS — Z8249 Family history of ischemic heart disease and other diseases of the circulatory system: Secondary | ICD-10-CM | POA: Diagnosis not present

## 2022-04-16 DIAGNOSIS — I5042 Chronic combined systolic (congestive) and diastolic (congestive) heart failure: Secondary | ICD-10-CM | POA: Diagnosis present

## 2022-04-16 DIAGNOSIS — I63432 Cerebral infarction due to embolism of left posterior cerebral artery: Secondary | ICD-10-CM | POA: Diagnosis present

## 2022-04-16 DIAGNOSIS — E1165 Type 2 diabetes mellitus with hyperglycemia: Secondary | ICD-10-CM | POA: Diagnosis present

## 2022-04-16 DIAGNOSIS — Z5982 Transportation insecurity: Secondary | ICD-10-CM | POA: Diagnosis not present

## 2022-04-16 DIAGNOSIS — Z20822 Contact with and (suspected) exposure to covid-19: Secondary | ICD-10-CM | POA: Diagnosis present

## 2022-04-16 DIAGNOSIS — I159 Secondary hypertension, unspecified: Secondary | ICD-10-CM | POA: Diagnosis not present

## 2022-04-16 DIAGNOSIS — E1169 Type 2 diabetes mellitus with other specified complication: Secondary | ICD-10-CM | POA: Diagnosis not present

## 2022-04-16 DIAGNOSIS — Z800 Family history of malignant neoplasm of digestive organs: Secondary | ICD-10-CM | POA: Diagnosis not present

## 2022-04-16 HISTORY — PX: LEFT HEART CATH AND CORONARY ANGIOGRAPHY: CATH118249

## 2022-04-16 LAB — GLUCOSE, CAPILLARY
Glucose-Capillary: 120 mg/dL — ABNORMAL HIGH (ref 70–99)
Glucose-Capillary: 127 mg/dL — ABNORMAL HIGH (ref 70–99)
Glucose-Capillary: 110 mg/dL — ABNORMAL HIGH (ref 70–99)

## 2022-04-16 LAB — BASIC METABOLIC PANEL
Anion gap: 9 (ref 5–15)
BUN: 13 mg/dL (ref 8–23)
CO2: 23 mmol/L (ref 22–32)
Calcium: 9.1 mg/dL (ref 8.9–10.3)
Chloride: 108 mmol/L (ref 98–111)
Creatinine, Ser: 1.03 mg/dL — ABNORMAL HIGH (ref 0.44–1.00)
GFR, Estimated: 56 mL/min — ABNORMAL LOW (ref >60)
Glucose, Bld: 121 mg/dL — ABNORMAL HIGH (ref 70–99)
Potassium: 3.7 mmol/L (ref 3.5–5.1)
Sodium: 140 mmol/L (ref 135–145)

## 2022-04-16 SURGERY — LEFT HEART CATH AND CORONARY ANGIOGRAPHY
Anesthesia: LOCAL

## 2022-04-16 MED ORDER — MIDAZOLAM HCL 2 MG/2ML IJ SOLN
INTRAMUSCULAR | Status: DC | PRN
  Administered 2022-04-16: 1 mg via INTRAVENOUS

## 2022-04-16 MED ORDER — SODIUM CHLORIDE 0.9 % IV SOLN: 250.0000 mL | INTRAVENOUS | Status: DC | PRN

## 2022-04-16 MED ORDER — HEPARIN (PORCINE) IN NACL 1000-0.9 UT/500ML-% IV SOLN
INTRAVENOUS | Status: DC | PRN
  Administered 2022-04-16 (×2): 500 mL

## 2022-04-16 MED ORDER — HEPARIN (PORCINE) IN NACL 1000-0.9 UT/500ML-% IV SOLN
INTRAVENOUS | Status: AC
  Filled 2022-04-16: qty 1000

## 2022-04-16 MED ORDER — HEPARIN SODIUM (PORCINE) 1000 UNIT/ML IJ SOLN
INTRAMUSCULAR | Status: DC | PRN
  Administered 2022-04-16: 4500 [IU] via INTRAVENOUS

## 2022-04-16 MED ORDER — DIAZEPAM 2 MG PO TABS: 2.0000 mg | ORAL_TABLET | Freq: Four times a day (QID) | ORAL | Status: DC | PRN

## 2022-04-16 MED ORDER — SODIUM CHLORIDE 0.9% FLUSH
3.0000 mL | Freq: Two times a day (BID) | INTRAVENOUS | Status: DC
  Administered 2022-04-16 – 2022-04-17 (×3): 3 mL via INTRAVENOUS

## 2022-04-16 MED ORDER — FENTANYL CITRATE (PF) 100 MCG/2ML IJ SOLN
INTRAMUSCULAR | Status: AC
  Filled 2022-04-16: qty 2

## 2022-04-16 MED ORDER — MIDAZOLAM HCL 2 MG/2ML IJ SOLN
INTRAMUSCULAR | Status: AC
  Filled 2022-04-16: qty 2

## 2022-04-16 MED ORDER — CLOPIDOGREL BISULFATE 75 MG PO TABS: 75.0000 mg | ORAL_TABLET | Freq: Every day | ORAL | Status: DC

## 2022-04-16 MED ORDER — LABETALOL HCL 5 MG/ML IV SOLN: 10.0000 mg | INTRAVENOUS | Status: AC | PRN

## 2022-04-16 MED ORDER — NITROGLYCERIN 0.4 MG SL SUBL: 0.4000 mg | SUBLINGUAL_TABLET | SUBLINGUAL | Status: DC | PRN

## 2022-04-16 MED ORDER — SODIUM CHLORIDE 0.9 % IV SOLN: INTRAVENOUS | Status: AC

## 2022-04-16 MED ORDER — HEPARIN SODIUM (PORCINE) 1000 UNIT/ML IJ SOLN
INTRAMUSCULAR | Status: AC
  Filled 2022-04-16: qty 10

## 2022-04-16 MED ORDER — ATORVASTATIN CALCIUM 80 MG PO TABS: 80.0000 mg | ORAL_TABLET | Freq: Every day | ORAL | Status: DC

## 2022-04-16 MED ORDER — LIDOCAINE HCL (PF) 1 % IJ SOLN
INTRAMUSCULAR | Status: DC | PRN
  Administered 2022-04-16: 2 mL via INTRADERMAL

## 2022-04-16 MED ORDER — HYDRALAZINE HCL 20 MG/ML IJ SOLN: 10.0000 mg | INTRAMUSCULAR | Status: AC | PRN

## 2022-04-16 MED ORDER — VERAPAMIL HCL 2.5 MG/ML IV SOLN
INTRAVENOUS | Status: AC
  Filled 2022-04-16: qty 2

## 2022-04-16 MED ORDER — ASPIRIN 81 MG PO CHEW: 81.0000 mg | CHEWABLE_TABLET | Freq: Every day | ORAL | Status: DC

## 2022-04-16 MED ORDER — ONDANSETRON HCL 4 MG/2ML IJ SOLN: 4.0000 mg | Freq: Four times a day (QID) | INTRAMUSCULAR | Status: DC | PRN

## 2022-04-16 MED ORDER — VERAPAMIL HCL 2.5 MG/ML IV SOLN
INTRAVENOUS | Status: DC | PRN
  Administered 2022-04-16: 10 mL via INTRA_ARTERIAL

## 2022-04-16 MED ORDER — IOHEXOL 350 MG/ML SOLN
INTRAVENOUS | Status: DC | PRN
  Administered 2022-04-16: 60 mL

## 2022-04-16 MED ORDER — LIDOCAINE HCL (PF) 1 % IJ SOLN
INTRAMUSCULAR | Status: AC
  Filled 2022-04-16: qty 30

## 2022-04-16 MED ORDER — FENTANYL CITRATE (PF) 100 MCG/2ML IJ SOLN
INTRAMUSCULAR | Status: DC | PRN
  Administered 2022-04-16: 25 ug via INTRAVENOUS

## 2022-04-16 MED ORDER — SODIUM CHLORIDE 0.9% FLUSH
3.0000 mL | INTRAVENOUS | Status: DC | PRN
Start: 2022-04-16 — End: 2022-04-17

## 2022-04-16 MED ORDER — ACETAMINOPHEN 325 MG PO TABS: 650.0000 mg | ORAL_TABLET | ORAL | Status: DC | PRN

## 2022-04-16 MED ORDER — ENOXAPARIN SODIUM 40 MG/0.4ML IJ SOSY
40.0000 mg | PREFILLED_SYRINGE | INTRAMUSCULAR | Status: DC
  Administered 2022-04-17: 40 mg via SUBCUTANEOUS
  Filled 2022-04-16: qty 0.4

## 2022-04-16 SURGICAL SUPPLY — 12 items
BAND CMPR LRG ZPHR (HEMOSTASIS) ×1
BAND ZEPHYR COMPRESS 30 LONG (HEMOSTASIS) IMPLANT
CATH LAUNCHER 5F JR4 (CATHETERS) IMPLANT
CATH OPTITORQUE TIG 4.0 5F (CATHETERS) IMPLANT
GLIDESHEATH SLEND SS 6F .021 (SHEATH) IMPLANT
GUIDEWIRE INQWIRE 1.5J.035X260 (WIRE) IMPLANT
INQWIRE 1.5J .035X260CM (WIRE) ×1
KIT HEART LEFT (KITS) ×2 IMPLANT
PACK CARDIAC CATHETERIZATION (CUSTOM PROCEDURE TRAY) ×2 IMPLANT
SHEATH PROBE COVER 6X72 (BAG) IMPLANT
TRANSDUCER W/STOPCOCK (MISCELLANEOUS) ×2 IMPLANT
TUBING CIL FLEX 10 FLL-RA (TUBING) ×2 IMPLANT

## 2022-04-16 NOTE — Interval H&P Note (Signed)
Cath Lab Visit (complete for each Cath Lab visit)  Clinical Evaluation Leading to the Procedure:   ACS: No.  Non-ACS:    Anginal Classification: CCS I  Anti-ischemic medical therapy: Minimal Therapy (1 Wong of medications)  Non-Invasive Test Results: Intermediate-risk stress test findings: cardiac mortality 1-3%/year  Prior CABG: No previous CABG      History and Physical Interval Note:  04/16/2022 7:41 AM  Paula Wong  has presented today for surgery, with the diagnosis of abnormal coronary CT.  The various methods of treatment have been discussed with the patient and family. After consideration of risks, benefits and other options for treatment, the patient has consented to  Procedure(s): LEFT HEART CATH AND CORONARY ANGIOGRAPHY (N/A) as a surgical intervention.  The patient's history has been reviewed, patient examined, no change in status, stable for surgery.  I have reviewed the patient's chart and labs.  Questions were answered to the patient's satisfaction.     Shelva Majestic

## 2022-04-16 NOTE — Progress Notes (Signed)
Rounding Note    Patient Name: Paula Wong Date of Encounter: 04/16/2022  [facility] Cardiologist:  Quentin Ore / [place]   Subjective    Pt is a 76 yo with recent dx of acute on chronic combined CHF, and CVA  Echo 11/2021 shows EF 20-25% Had a recent CVA  CTA showed a mid LAD stenosis. Cath this am confirmed a LAD stenosis - mid stenosis of ~70% Her LV dysfunction seems out of proportion to her CAD She is not having angina . Will continue medical therapy for her CHF. We can refer her to shock wave therapy PCI if she starts having angina symptoms    Inpatient Medications    Scheduled Meds:  aspirin  81 mg Oral Daily   atorvastatin  80 mg Oral Daily   clopidogrel  75 mg Oral Daily   dapagliflozin propanediol  10 mg Oral Daily   [START ON 04/17/2022] enoxaparin (LOVENOX) injection  40 mg Subcutaneous Q24H   gabapentin  100 mg Oral BID   insulin aspart  0-9 Units Subcutaneous TID AC & HS   magnesium oxide  400 mg Oral BID   metoprolol succinate  75 mg Oral BID   pantoprazole  20 mg Oral Daily   sacubitril-valsartan  1 tablet Oral BID   sodium chloride flush  3 mL Intravenous Q12H   spironolactone  12.5 mg Oral Daily   Continuous Infusions:  sodium chloride 50 mL/hr at 04/16/22 0936   sodium chloride     PRN Meds: sodium chloride, acetaminophen, diazepam, hydrALAZINE, labetalol, melatonin, prochlorperazine, sodium chloride flush   Vital Signs    Vitals:   04/16/22 0843 04/16/22 0929 04/16/22 1054 04/16/22 1150  BP:  119/82 131/77 135/77  Pulse: 76 73 77 82  Resp: '19 16 16   '$ Temp:  (!) 97.5 F (36.4 C) 97.7 F (36.5 C) 98.4 F (36.9 C)  TempSrc:  Oral Oral Oral  SpO2:  97% 96% 96%  Weight:      Height:        Intake/Output Summary (Last 24 hours) at 04/16/2022 1332 Last data filed at 04/16/2022 0600 Gross per 24 hour  Intake 181.02 ml  Output --  Net 181.02 ml      04/16/2022    4:00 AM 04/14/2022    5:00 AM 08/24/2021    8:41 AM  Last 3  Weights  Weight (lbs) 186 lb 4.6 oz 194 lb 10.7 oz 190 lb  Weight (kg) 84.5 kg 88.3 kg 86.183 kg      Telemetry    NSR - Personally Reviewed  ECG     - Personally Reviewed  Physical Exam   Physical Exam: Blood pressure 135/77, pulse 82, temperature 98.4 F (36.9 C), temperature source Oral, resp. rate 16, height '5\' 3"'$  (1.6 m), weight 84.5 kg, SpO2 96 %.    GEN:  Well nourished, well developed in no acute distress HEENT: Normal NECK: No JVD; No carotid bruits LYMPHATICS: No lymphadenopathy CARDIAC: RRR  ,  implantable look recorder bandage has a small amout of dried blood. RESPIRATORY:  Clear to auscultation without rales, wheezing or rhonchi  ABDOMEN: Soft, non-tender, non-distended MUSCULOSKELETAL:  R radial TB band in place  SKIN: Warm and dry NEUROLOGIC:  Alert and oriented x 3    Labs    High Sensitivity Troponin:  No results for input(s): "TROPONINIHS" in the last 720 hours.   Chemistry Recent Labs  Lab 04/12/22 1745 04/13/22 0036 04/14/22 0215 04/14/22 0226 04/15/22 0401 04/16/22  0408  NA 142   < > 142  --  140 140  K 3.8   < > 3.6  --  4.2 3.7  CL 108   < > 107  --  107 108  CO2 24   < > 24  --  22 23  GLUCOSE 137*   < > 85  --  120* 121*  BUN 16   < > 9  --  13 13  CREATININE 0.94   < > 0.87  --  0.91 1.03*  CALCIUM 9.8   < > 9.3  --  9.5 9.1  MG  --   --   --  1.9  --   --   PROT 7.8  --   --   --   --   --   ALBUMIN 4.3  --   --   --   --   --   AST 21  --   --   --   --   --   ALT 15  --   --   --   --   --   ALKPHOS 41  --   --   --   --   --   BILITOT 0.9  --   --   --   --   --   GFRNONAA >60   < > >60  --  >60 56*  ANIONGAP 10   < > 11  --  11 9   < > = values in this interval not displayed.     Lipids  Recent Labs  Lab 04/13/22 0500  CHOL 121  TRIG 82  HDL 35*  LDLCALC 70  CHOLHDL 3.5     Hematology Recent Labs  Lab 04/13/22 0530 04/14/22 0215 04/15/22 0401  WBC 5.9 4.9 4.9  RBC 4.65 4.62 4.76  HGB 12.7 12.6 13.1   HCT 39.5 38.5 39.4  MCV 84.9 83.3 82.8  MCH 27.3 27.3 27.5  MCHC 32.2 32.7 33.2  RDW 14.1 13.7 13.7  PLT 285 250 256    Thyroid No results for input(s): "TSH", "FREET4" in the last 168 hours.  BNPNo results for input(s): "BNP", "PROBNP" in the last 168 hours.  DDimer No results for input(s): "DDIMER" in the last 168 hours.   Radiology    CARDIAC CATHETERIZATION  Result Date: 04/16/2022   Colon Flattery LM lesion is 40% stenosed.   Mid LM to Dist LM lesion is 30% stenosed.   Ost LAD to Prox LAD lesion is 40% stenosed.   Mid LAD-1 lesion is 80% stenosed.   Mid LAD-2 lesion is 30% stenosed.   RV Branch lesion is 70% stenosed.   Mid Cx lesion is 5% stenosed. Significant multi vessel coronary calcification. The left main has 40% ostial 30% distal stenosis; the LAD has 40% ostial to proximal stenosis followed by focal calcified 80% stenosis prior to the takeoff of a septal perforator and the second diagonal vessel with 30% mid stenosis; mild nonobstructive disease in the left circumflex vessel, and diffuse 70% stenosis in the marginal branch of the RCA. LVEDP is elevated at 22 mmHg. RECOMMENDATION: Patient has significant LV dysfunction out of proportion to her or significant LAD stenosis.  She has been without anginal symptomatology.  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  Consider addition of anti-ischemic medical therapy with 80% LAD stenosis.  Will ask colleagues to review.  If patient has  increasing symptomatology consider PCI to LAD with possible shockwave or CSI atherectomy if needed.   CT CORONARY MORPH W/CTA COR W/SCORE W/CA W/CM &/OR WO/CM  Addendum Date: 04/16/2022   ADDENDUM REPORT: 04/16/2022 09:40 EXAM: OVER-READ INTERPRETATION  CT CHEST The following report is a limited chest CT over-read performed by radiologist Dr. Lindaann Slough [facility] on 04/16/2022. This over-read does not include interpretation of cardiac or coronary  anatomy or pathology. The coronary CTA interpretation by the cardiologist is attached. COMPARISON:  None. FINDINGS: Mediastinum/Nodes: No enlarged lymph nodes within the visualized mediastinum. Lungs/Pleura: There is no pleural effusion. The visualized lungs appear clear. Upper abdomen: No significant findings in the visualized upper abdomen. Musculoskeletal/Chest wall: No chest wall mass or suspicious osseous findings within the visualized chest. IMPRESSION: No significant extracardiac findings within the visualized chest. Electronically Signed   By: Richardean Sale M.D.   On: 04/16/2022 09:40   Result Date: 04/16/2022 CLINICAL DATA:  76 Year old African American Female EXAM: Cardiac/Coronary  CTA TECHNIQUE: The patient was scanned on a [employer]. FINDINGS: Scan was triggered in the descending thoracic aorta. Axial non-contrast 3 mm slices were carried out through the heart. The data set was analyzed on a dedicated work station and scored using the [place]. Gantry rotation speed was 250 msecs and collimation was .6 mm. 0.8 mg of sl NTG was given. The 3D data set was reconstructed in 5% intervals of the 67-82 % of the R-R cycle. Diastolic phases were analyzed on a dedicated work station using MPR, MIP and VRT modes. The patient received 95 cc of contrast. Coronary Arteries:  Normal coronary origin.  Co-dominance. Coronary Calcium Score: Left main: 896 Left anterior descending artery: 1050 Left circumflex artery: 304 Right coronary artery: 152 Ramus intermedius artery: 150 Total: 2553 Percentile: 99th for age, sex, and race matched control. RCA is an artery that gives rise to PDA and PLA. Minimal calcified plaque (1-24%) in the ostial RCA. Mild soft plaque (25-49%) mid RCA. Minimal calcified plaque (1-24%) in the distal RCA. Left main is a large artery that gives rise to LAD, RI, and LCX arteries. Mild calcified plaque (25-49%) proximal left main. LAD is a large vessel that gives rise to two  diagonal vessels. Moderate stenosis, calcified plaque, (50-70%) ostial LAD. Moderate stenosis, calcified plaque, (50-70%) proximal LAD. There are two severe mixed plaque stenosis (70-99%) in the mid LAD. Severe mixed plaque stenosis (70-99%) distal LAD LCX is a co-dominant artery that gives rise to multiple OM vessels and a small L-PDA. Mild calcified plaque (25-49%) proximal RCA. Ramus intermedius artery with mild, mixed plaque in the proximal vessel then a severe mixed plaque stenosis (70-99%) in the mid body. Vessel is diffusely diseased. Other findings: Aorta: Normal size.  Aortic atherosclerosis.  No dissection. Main Pulmonary Artery: Severe dilation 35 mm. Aortic Valve:  Tri-leaflet.  No calcifications. Normal pulmonary vein drainage into the left atrium. Normal left atrial appendage without a thrombus. Small PFO. Severe left ventricular dilation. Small pericardial effusion anterior to the right ventricle an lateral to the left ventricle Extra-cardiac findings: See attached radiology report for non-cardiac structures. IMPRESSION: 1. Coronary calcium score of 2553. This was 99th percentile for age, sex, and race matched control. 2. Normal coronary origin with co-dominance. 3. CAD-RADS 4 Severe stenosis. (70-99% or > 50% left main). CT FFR is will be sent. Consider symptom-guided anti-ischemic pharmacotherapy as well as risk factor modification per guideline directed care. 4.  Aortic atherosclerosis. 5. Severe left ventricular  dilation. RECOMMENDATIONS: Coronary artery calcium (CAC) score is a strong predictor of incident coronary heart disease (CHD) and provides predictive information beyond traditional risk factors. CAC scoring is reasonable to use in the decision to withhold, postpone, or initiate statin therapy in intermediate-risk or selected borderline-risk asymptomatic adults (age 38-75 years and LDL-C >=70 to <190 mg/dL) who do not have diabetes or established atherosclerotic cardiovascular disease  (ASCVD).* In intermediate-risk (10-year ASCVD risk >=7.5% to <20%) adults or selected borderline-risk (10-year ASCVD risk >=5% to <7.5%) adults in whom a CAC score is measured for the purpose of making a treatment decision the following recommendations have been made: If CAC = 0, it is reasonable to withhold statin therapy and reassess in 5 to 10 years, as long as higher risk conditions are absent (diabetes mellitus, family history of premature CHD in first degree relatives (males <55 years; females <65 years), cigarette smoking, LDL >=190 mg/dL or other independent risk factors). If CAC is 1 to 99, it is reasonable to initiate statin therapy for patients >=70 years of age. If CAC is >=100 or >=75th percentile, it is reasonable to initiate statin therapy at any age. Cardiology referral should be considered for patients with CAC scores =400 or >=75th percentile. *2018 AHA/ACC/AACVPR/AAPA/ABC/ACPM/ADA/AGS/APhA/ASPC/NLA/PCNA Guideline on the Management of Blood Cholesterol: A Report of the American College of Cardiology/American Heart Association Task Force on Clinical Practice Guidelines. J Am Coll Cardiol. 2019;73(24):3168-3209. Rudean Haskell, MD Electronically Signed: By: Rudean Haskell M.D. On: 04/15/2022 16:46   EP PPM/ICD IMPLANT  Result Date: 04/15/2022 CONCLUSIONS:  1. Successful implantation of a implantable loop recorder for Cryptogenic stroke  2. No early apparent complications.   CT CORONARY FRACTIONAL FLOW RESERVE DATA PREP  Result Date: 04/15/2022 EXAM: CT-FFR ANALYSIS CLINICAL DATA:  Possibly obstructive coronary lesion: LAD and RI FINDINGS: CT-FFR analysis was performed on the original cardiac CT angiogram dataset. Diagrammatic representation of the CT-FFR analysis is provided in a separate PDF document in PACS. This dictation was created using the PDF document and an interactive 3D model of the results. 3D model is not available in the EMR/PACS. Normal FFR range is >0.80. 1. Left  Main: No significant functional stenosis, CT-FFR 0.97. 2. LAD: significant functional stenosis, CT-FFR 0.93 in proximal LAD, 0.79 at mid LAD, and 0.70 at distal LAD lesion. 3. LCX: No significant functional stenosis, CT-FFR 0.96 at proximal LCX, 0.82 distal vessel. 4. RCA: No significant functional stenosis, CT-FFR 0.81 distal vessel. 5. RI: No significant functional stenosis, CT-FFR 0.96 at lesion. Provider note: In companion Cardiac CT, LCX is a co-dominant artery that gives rise to multiple OM vessels and a small L-PDA. Mild calcified plaque (25-49%) proximal LCX. Dictation inappropriately noted RCA (there is a mild calcified plaque also in the RCA). IMPRESSION: 1. CT FFR analysis shows evidence of significant functional stenosis in the LAD but not in the RI. Rudean Haskell MD Electronically Signed   By: Rudean Haskell M.D.   On: 04/15/2022 18:58   CT CORONARY FRACTIONAL FLOW RESERVE FLUID ANALYSIS  Result Date: 04/15/2022 EXAM: CT-FFR ANALYSIS CLINICAL DATA:  Possibly obstructive coronary lesion: LAD and RI FINDINGS: CT-FFR analysis was performed on the original cardiac CT angiogram dataset. Diagrammatic representation of the CT-FFR analysis is provided in a separate PDF document in PACS. This dictation was created using the PDF document and an interactive 3D model of the results. 3D model is not available in the EMR/PACS. Normal FFR range is >0.80. 1. Left Main: No significant functional stenosis, CT-FFR 0.97.  2. LAD: significant functional stenosis, CT-FFR 0.93 in proximal LAD, 0.79 at mid LAD, and 0.70 at distal LAD lesion. 3. LCX: No significant functional stenosis, CT-FFR 0.96 at proximal LCX, 0.82 distal vessel. 4. RCA: No significant functional stenosis, CT-FFR 0.81 distal vessel. 5. RI: No significant functional stenosis, CT-FFR 0.96 at lesion. Provider note: In companion Cardiac CT, LCX is a co-dominant artery that gives rise to multiple OM vessels and a small L-PDA. Mild calcified  plaque (25-49%) proximal LCX. Dictation inappropriately noted RCA (there is a mild calcified plaque also in the RCA). IMPRESSION: 1. CT FFR analysis shows evidence of significant functional stenosis in the LAD but not in the RI. Rudean Haskell MD Electronically Signed   By: Rudean Haskell M.D.   On: 04/15/2022 18:58    Cardiac Studies      Patient Profile     76 y.o. female  with chronic combined CHF, new CVA   Assessment & Plan     Chronic combined CHF:      Currently on toprol XL 75 mg BID Entresto 24-26 BID Spironolactone 12.5 QD  Farxiga 10 mg a day   Cont meds.   Will continue to titrate up her meds as OP   I would like for her to ambulate in the halls prior to DC  She feels well Could consider DC tomorrow if she does well   2.   CAD has a moderate - severe mid LAD stenosis .  No angina . Will add NTG SL PRN to her list .  She is on plavix already  3. S/p cath:  gave lifting precautions   I will follow up with her for her CAD and CHF. She will see Dr. Quentin Ore for follow up of her implantable loop recorder.          For questions or updates, please contact [place] Please consult [email or web address] for contact info under        Signed, Mertie Moores, MD  04/16/2022, 1:32 PM

## 2022-04-16 NOTE — Interval H&P Note (Signed)
History and Physical Interval Note:  04/16/2022 7:41 AM  Paula Wong  has presented today for surgery, with the diagnosis of abnormal coronary CT.  The various methods of treatment have been discussed with the patient and family. After consideration of risks, benefits and other options for treatment, the patient has consented to  Procedure(s): LEFT HEART CATH AND CORONARY ANGIOGRAPHY (N/A) as a surgical intervention.  The patient's history has been reviewed, patient examined, no change in status, stable for surgery.  I have reviewed the patient's chart and labs.  Questions were answered to the patient's satisfaction.     Shelva Majestic

## 2022-04-16 NOTE — Progress Notes (Addendum)
PROGRESS NOTE    Paula Wong  ZGY:[id number] DOB: January 24, 1946 DOA: 04/12/2022 PCP: Arthur Holms, NP    Brief Narrative:  Paula Wong is a 76 y.o. female with medical history significant for type 2 diabetes, hypertension, hyperlipidemia, HFrEF 20 to 25%, presented to hospital with abnormal CT scan findings by her primary care physician.  Patient had been having moderate to severe headache for which PCP had ordered a CT scan.  In the ED, patient was noted to have subacute cortical infarct  involving the inferior medial left occipital pole with petechial hemorrhage patient was asked to hospital for further evaluation and treatment.  During hospitalization, patient was seen by cardiology for reduced LV function and CT coronary was performed with coronary calcium score of 2553 with severe stenosis.  Patient then underwent cardiac catheterization on 04/16/2022.  Patient also had loop recorder placed in on 04/15/2022.  Patient is currently being optimized on heart failure medication.  Likely disposition home tomorrow if okay with cardiology  Assessment and plan. Principal Problem:   Acute CVA (cerebrovascular accident) ([place]) Active Problems:   Hypertension   Diabetes ([place])   Hyperlipidemia   Chronic systolic CHF (congestive heart failure) (HCC)   Abnormal cardiac CT angiography   Coronary artery disease involving native coronary artery of native heart without angina pectoris   Subacute ischemic CVA:   CT scan done outpatient showed edema in the left occipital lobe so MRI was done in the hospital which showed subacute cortical infarct involving the inferior and medial left occipital pole with petechial hemorrhage.  No large vessel occlusion on the CTA of the head and neck.  Hemoglobin A1c of 6.3.  LDL of 70.  2D echocardiogram with LV ejection fraction of 20 to 25% with severely decreased function and global hypokinesis and grade 1 diastolic dysfunction with positive bubble study.  Physical  therapy was held due to recommended outpatient PT. patient is currently on Lipitor, aspirin and Plavix.  Neurology recommended loop recorder and has received loop on 04/15/2022.  Neurology recommends aspirin and Plavix for 3 weeks followed by aspirin alone on discharge.  Recommendations to follow-up in outpatient stroke clinic in 2 months.  Uncontrolled hypertension:  Amlodipine was discontinued.  Currently on metoprolol, Entresto and Aldactone.  Closely monitor blood pressure.  Controlled at this time.  Type 2 diabetes mellitus: Hemoglobin A1c of 6.3.  Currently on sliding scale  insulin.   Patient is on Ozempic, Tresiba, metformin ,farxiga at home.  Resumed farxiga during hospitalization.   Chronic systolic congestive heart failure: Compensated.  Of note patient did have 2D echocardiogram on 12/09/2021 which showed LV ejection fraction of 20 to 25% with global hypokinesis.  Patient was not aware of this finding.  Cardiology on board and patient underwent CT coronaries and cardiac cath.  Cardiac cath reported mid LAD stenosis of 70% stenosis.  Patient did have significant LV dysfunction out of proportion to her CAD.  No evidence of angina. Patient is currently on aspirin Plavix statins, farxiga, Entresto. Communicated with cardiology Dr Acie Fredrickson who recommends overnight observation/ambulation and if okay disposition home tomorrow.   Headache:.  Improved   DVT prophylaxis: enoxaparin (LOVENOX) injection 40 mg Start: 04/17/22 0800 SCD's Start: 04/16/22 0932   Code Status:     Code Status: Full Code  Disposition: Home tomorrow on 04/17/2022 if ok with cardiology  Status is: Observation  The patient will require care spanning > 2 midnights and should be moved to inpatient because status post CVA, loop recorder, left heart  cath.     Family Communication:  None at bedside.  Unable to reach the patient's friend Paula Wong on the phone number listed.  Communicated with the patient at  bedside.  Consultants:  Cardiology Neurology  Procedures:  Loop recorder placement 04/15/2022 Left heart cath 04/16/2022  Antimicrobials:  None  Anti-infectives (From admission, onward)    None      Subjective: Today, patient was seen and examined at bedside.  Seen after cath.  Denies any chest pain, shortness of breath, dizziness or lightheadedness.  Wishes to see how she does today especially after ambulation.  Objective: Vitals:   04/16/22 0843 04/16/22 0929 04/16/22 1054 04/16/22 1150  BP:  119/82 131/77 135/77  Pulse: 76 73 77 82  Resp: '19 16 16   '$ Temp:  (!) 97.5 F (36.4 C) 97.7 F (36.5 C) 98.4 F (36.9 C)  TempSrc:  Oral Oral Oral  SpO2:  97% 96% 96%  Weight:      Height:        Intake/Output Summary (Last 24 hours) at 04/16/2022 1411 Last data filed at 04/16/2022 1409 Gross per 24 hour  Intake 900.67 ml  Output --  Net 900.67 ml   Filed Weights   04/14/22 0500 04/16/22 0400  Weight: 88.3 kg 84.5 kg    Physical Examination: Body mass index is 33 kg/m.   General: Obese built, not in obvious distress HENT:   No scleral pallor or icterus noted. Oral mucosa is moist.  Chest:  Clear breath sounds.  Diminished breath sounds bilaterally. No crackles or wheezes.  CVS: S1 &S2 heard. No murmur.  Regular rate and rhythm. Abdomen: Soft, nontender, nondistended.  Bowel sounds are heard.   Extremities: No cyanosis, clubbing or edema.  Peripheral pulses are palpable.  Left chest wall with a loop recorder, right wrist with compression from heart cath. Psych: Alert, awake and oriented, normal mood CNS:  No cranial nerve deficits.  Power equal in all extremities.   Skin: Warm and dry.  No rashes noted.  Data Reviewed:   CBC: Recent Labs  Lab 04/12/22 1745 04/13/22 0036 04/13/22 0530 04/14/22 0215 04/15/22 0401  WBC 5.8 6.4 5.9 4.9 4.9  NEUTROABS 2.9  --   --   --   --   HGB 13.5 13.6 12.7 12.6 13.1  HCT 41.0 42.6 39.5 38.5 39.4  MCV 84.2 85.2 84.9 83.3  82.8  PLT 270 272 285 250 256     Basic Metabolic Panel: Recent Labs  Lab 04/12/22 1745 04/13/22 0036 04/13/22 0530 04/14/22 0215 04/14/22 0226 04/15/22 0401 04/16/22 0408  NA 142  --  140 142  --  140 140  K 3.8  --  3.5 3.6  --  4.2 3.7  CL 108  --  109 107  --  107 108  CO2 24  --  24 24  --  22 23  GLUCOSE 137*  --  87 85  --  120* 121*  BUN 16  --  12 9  --  13 13  CREATININE 0.94 0.87 0.72 0.87  --  0.91 1.03*  CALCIUM 9.8  --  8.9 9.3  --  9.5 9.1  MG  --   --   --   --  1.9  --   --      Liver Function Tests: Recent Labs  Lab 04/12/22 1745  AST 21  ALT 15  ALKPHOS 41  BILITOT 0.9  PROT 7.8  ALBUMIN 4.3  Radiology Studies: CARDIAC CATHETERIZATION  Result Date: 04/16/2022   Colon Flattery LM lesion is 40% stenosed.   Mid LM to Dist LM lesion is 30% stenosed.   Ost LAD to Prox LAD lesion is 40% stenosed.   Mid LAD-1 lesion is 80% stenosed.   Mid LAD-2 lesion is 30% stenosed.   RV Branch lesion is 70% stenosed.   Mid Cx lesion is 5% stenosed. Significant multi vessel coronary calcification. The left main has 40% ostial 30% distal stenosis; the LAD has 40% ostial to proximal stenosis followed by focal calcified 80% stenosis prior to the takeoff of a septal perforator and the second diagonal vessel with 30% mid stenosis; mild nonobstructive disease in the left circumflex vessel, and diffuse 70% stenosis in the marginal branch of the RCA. LVEDP is elevated at 22 mmHg. RECOMMENDATION: Patient has significant LV dysfunction out of proportion to her or significant LAD stenosis.  She has been without anginal symptomatology.  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  Consider addition of anti-ischemic medical therapy with 80% LAD stenosis.  Will ask colleagues to review.  If patient has increasing symptomatology consider PCI to LAD with possible shockwave or CSI atherectomy if needed.   CT CORONARY MORPH W/CTA COR W/SCORE W/CA W/CM  &/OR WO/CM  Addendum Date: 04/16/2022   ADDENDUM REPORT: 04/16/2022 09:40 EXAM: OVER-READ INTERPRETATION  CT CHEST The following report is a limited chest CT over-read performed by radiologist Dr. Lindaann Slough [facility] on 04/16/2022. This over-read does not include interpretation of cardiac or coronary anatomy or pathology. The coronary CTA interpretation by the cardiologist is attached. COMPARISON:  None. FINDINGS: Mediastinum/Nodes: No enlarged lymph nodes within the visualized mediastinum. Lungs/Pleura: There is no pleural effusion. The visualized lungs appear clear. Upper abdomen: No significant findings in the visualized upper abdomen. Musculoskeletal/Chest wall: No chest wall mass or suspicious osseous findings within the visualized chest. IMPRESSION: No significant extracardiac findings within the visualized chest. Electronically Signed   By: Richardean Sale M.D.   On: 04/16/2022 09:40   Result Date: 04/16/2022 CLINICAL DATA:  76 Year old African American Female EXAM: Cardiac/Coronary  CTA TECHNIQUE: The patient was scanned on a [employer]. FINDINGS: Scan was triggered in the descending thoracic aorta. Axial non-contrast 3 mm slices were carried out through the heart. The data set was analyzed on a dedicated work station and scored using the Thoreau. Gantry rotation speed was 250 msecs and collimation was .6 mm. 0.8 mg of sl NTG was given. The 3D data set was reconstructed in 5% intervals of the 67-82 % of the R-R cycle. Diastolic phases were analyzed on a dedicated work station using MPR, MIP and VRT modes. The patient received 95 cc of contrast. Coronary Arteries:  Normal coronary origin.  Co-dominance. Coronary Calcium Score: Left main: 896 Left anterior descending artery: 1050 Left circumflex artery: 304 Right coronary artery: 152 Ramus intermedius artery: 150 Total: 2553 Percentile: 99th for age, sex, and race matched control. RCA is an artery that gives rise to  PDA and PLA. Minimal calcified plaque (1-24%) in the ostial RCA. Mild soft plaque (25-49%) mid RCA. Minimal calcified plaque (1-24%) in the distal RCA. Left main is a large artery that gives rise to LAD, RI, and LCX arteries. Mild calcified plaque (25-49%) proximal left main. LAD is a large vessel that gives rise to two diagonal vessels. Moderate stenosis, calcified plaque, (50-70%) ostial LAD. Moderate stenosis, calcified plaque, (50-70%)  proximal LAD. There are two severe mixed plaque stenosis (70-99%) in the mid LAD. Severe mixed plaque stenosis (70-99%) distal LAD LCX is a co-dominant artery that gives rise to multiple OM vessels and a small L-PDA. Mild calcified plaque (25-49%) proximal RCA. Ramus intermedius artery with mild, mixed plaque in the proximal vessel then a severe mixed plaque stenosis (70-99%) in the mid body. Vessel is diffusely diseased. Other findings: Aorta: Normal size.  Aortic atherosclerosis.  No dissection. Main Pulmonary Artery: Severe dilation 35 mm. Aortic Valve:  Tri-leaflet.  No calcifications. Normal pulmonary vein drainage into the left atrium. Normal left atrial appendage without a thrombus. Small PFO. Severe left ventricular dilation. Small pericardial effusion anterior to the right ventricle an lateral to the left ventricle Extra-cardiac findings: See attached radiology report for non-cardiac structures. IMPRESSION: 1. Coronary calcium score of 2553. This was 99th percentile for age, sex, and race matched control. 2. Normal coronary origin with co-dominance. 3. CAD-RADS 4 Severe stenosis. (70-99% or > 50% left main). CT FFR is will be sent. Consider symptom-guided anti-ischemic pharmacotherapy as well as risk factor modification per guideline directed care. 4.  Aortic atherosclerosis. 5. Severe left ventricular dilation. RECOMMENDATIONS: Coronary artery calcium (CAC) score is a strong predictor of incident coronary heart disease (CHD) and provides predictive information beyond  traditional risk factors. CAC scoring is reasonable to use in the decision to withhold, postpone, or initiate statin therapy in intermediate-risk or selected borderline-risk asymptomatic adults (age 36-75 years and LDL-C >=70 to <190 mg/dL) who do not have diabetes or established atherosclerotic cardiovascular disease (ASCVD).* In intermediate-risk (10-year ASCVD risk >=7.5% to <20%) adults or selected borderline-risk (10-year ASCVD risk >=5% to <7.5%) adults in whom a CAC score is measured for the purpose of making a treatment decision the following recommendations have been made: If CAC = 0, it is reasonable to withhold statin therapy and reassess in 5 to 10 years, as long as higher risk conditions are absent (diabetes mellitus, family history of premature CHD in first degree relatives (males <55 years; females <65 years), cigarette smoking, LDL >=190 mg/dL or other independent risk factors). If CAC is 1 to 99, it is reasonable to initiate statin therapy for patients >=71 years of age. If CAC is >=100 or >=75th percentile, it is reasonable to initiate statin therapy at any age. Cardiology referral should be considered for patients with CAC scores =400 or >=75th percentile. *2018 AHA/ACC/AACVPR/AAPA/ABC/ACPM/ADA/AGS/APhA/ASPC/NLA/PCNA Guideline on the Management of Blood Cholesterol: A Report of the American College of Cardiology/American Heart Association Task Force on Clinical Practice Guidelines. J Am Coll Cardiol. 2019;73(24):3168-3209. Rudean Haskell, MD Electronically Signed: By: Rudean Haskell M.D. On: 04/15/2022 16:46   EP PPM/ICD IMPLANT  Result Date: 04/15/2022 CONCLUSIONS:  1. Successful implantation of a implantable loop recorder for Cryptogenic stroke  2. No early apparent complications.   CT CORONARY FRACTIONAL FLOW RESERVE DATA PREP  Result Date: 04/15/2022 EXAM: CT-FFR ANALYSIS CLINICAL DATA:  Possibly obstructive coronary lesion: LAD and RI FINDINGS: CT-FFR analysis was  performed on the original cardiac CT angiogram dataset. Diagrammatic representation of the CT-FFR analysis is provided in a separate PDF document in PACS. This dictation was created using the PDF document and an interactive 3D model of the results. 3D model is not available in the EMR/PACS. Normal FFR range is >0.80. 1. Left Main: No significant functional stenosis, CT-FFR 0.97. 2. LAD: significant functional stenosis, CT-FFR 0.93 in proximal LAD, 0.79 at mid LAD, and 0.70 at distal LAD lesion. 3. LCX: No  significant functional stenosis, CT-FFR 0.96 at proximal LCX, 0.82 distal vessel. 4. RCA: No significant functional stenosis, CT-FFR 0.81 distal vessel. 5. RI: No significant functional stenosis, CT-FFR 0.96 at lesion. Provider note: In companion Cardiac CT, LCX is a co-dominant artery that gives rise to multiple OM vessels and a small L-PDA. Mild calcified plaque (25-49%) proximal LCX. Dictation inappropriately noted RCA (there is a mild calcified plaque also in the RCA). IMPRESSION: 1. CT FFR analysis shows evidence of significant functional stenosis in the LAD but not in the RI. Rudean Haskell MD Electronically Signed   By: Rudean Haskell M.D.   On: 04/15/2022 18:58   CT CORONARY FRACTIONAL FLOW RESERVE FLUID ANALYSIS  Result Date: 04/15/2022 EXAM: CT-FFR ANALYSIS CLINICAL DATA:  Possibly obstructive coronary lesion: LAD and RI FINDINGS: CT-FFR analysis was performed on the original cardiac CT angiogram dataset. Diagrammatic representation of the CT-FFR analysis is provided in a separate PDF document in PACS. This dictation was created using the PDF document and an interactive 3D model of the results. 3D model is not available in the EMR/PACS. Normal FFR range is >0.80. 1. Left Main: No significant functional stenosis, CT-FFR 0.97. 2. LAD: significant functional stenosis, CT-FFR 0.93 in proximal LAD, 0.79 at mid LAD, and 0.70 at distal LAD lesion. 3. LCX: No significant functional stenosis,  CT-FFR 0.96 at proximal LCX, 0.82 distal vessel. 4. RCA: No significant functional stenosis, CT-FFR 0.81 distal vessel. 5. RI: No significant functional stenosis, CT-FFR 0.96 at lesion. Provider note: In companion Cardiac CT, LCX is a co-dominant artery that gives rise to multiple OM vessels and a small L-PDA. Mild calcified plaque (25-49%) proximal LCX. Dictation inappropriately noted RCA (there is a mild calcified plaque also in the RCA). IMPRESSION: 1. CT FFR analysis shows evidence of significant functional stenosis in the LAD but not in the RI. Rudean Haskell MD Electronically Signed   By: Rudean Haskell M.D.   On: 04/15/2022 18:58      LOS: 0 days    Flora Lipps, MD [facility] Available via Epic secure chat 7am-7pm After these hours, please refer to coverage provider listed on [email or web address] 04/16/2022, 2:11 PM

## 2022-04-16 NOTE — Progress Notes (Addendum)
Occupational Therapy Treatment Patient Details Name: Paula Wong MRN: [id number] DOB: [birth year] Today's Date: 04/16/2022   History of present illness Pt is a 76 y/o F presenting to ED on 8/25 from PCP office for abnormal CT. MRI revealing subacute cortical infarct involving inferior and medial L occipital pole with petechial hemorrhage. PMH includes DM, diverticulosis, GERD, HLD, HTN, and neuropathy.   OT comments  Pt with goals met, able to complete ADLs and mobility with mod I. Continues to present with R visual field deficits, educated on compensatory strategies and safety strategies for home (handout provided), pt verbalized understanding. Pt able to verbalize precautions for RUE from heart catheterization, and adheres to them throughout session. Pt presenting with impairments listed below, however has no acute OT needs at this time. Will s/o. Please reconsult if there is a change in pt status. Continue to recommend OP OT at d/c.    Recommendations for follow up therapy are one component of a multi-disciplinary discharge planning process, led by the attending physician.  Recommendations may be updated based on patient status, additional functional criteria and insurance authorization.    Follow Up Recommendations  Outpatient OT    Assistance Recommended at Discharge Set up Supervision/Assistance  Patient can return home with the following  Assist for transportation;Direct supervision/assist for medications management;Direct supervision/assist for financial management;Assistance with cooking/housework   Equipment Recommendations  None recommended by OT    Recommendations for Other Services      Precautions / Restrictions Precautions Precautions: None Precaution Comments: no lifting/pushing/pulling RUE       Mobility Bed Mobility Overal bed mobility: Modified Independent                  Transfers Overall transfer level: Modified independent                        Balance Overall balance assessment: Mild deficits observed, not formally tested                                         ADL either performed or assessed with clinical judgement   ADL Overall ADL's : Modified independent                                       General ADL Comments: donning gown and ambulating hallway distance mod I    Extremity/Trunk Assessment Upper Extremity Assessment Upper Extremity Assessment: Overall WFL for tasks assessed (pt able to verbalize RUE precautions post heart cath)   Lower Extremity Assessment Lower Extremity Assessment: Defer to PT evaluation        Vision   Additional Comments: R visual field deficit, education on low vision and scanning techniques for safety at home   Perception Perception Perception: Not tested   Praxis Praxis Praxis: Not tested    Cognition Arousal/Alertness: Awake/alert Behavior During Therapy: WFL for tasks assessed/performed Overall Cognitive Status: Within Functional Limits for tasks assessed                                 General Comments: appearing WFL, A& Ox4, decreased short term memory, decreased attention with dual-tasking.        Exercises      Shoulder Instructions  General Comments VSS on RA    Pertinent Vitals/ Pain       Pain Assessment Pain Assessment: No/denies pain  Home Living                                          Prior Functioning/Environment              Frequency           Progress Toward Goals  OT Goals(current goals can now be found in the care plan section)  Progress towards OT goals: Goals met/education completed, patient discharged from OT  Acute Rehab OT Goals Patient Stated Goal: to go home OT Goal Formulation: With patient Time For Goal Achievement: 04/28/22 Potential to Achieve Goals: Good ADL Goals Additional ADL Goal #1: pt will receive passing score on pillbox  assessment in order to promote ind with med mgmt Additional ADL Goal #2: pt will verbalize 2 low vision strategies in order to safely perform ADLs/mobility at home  Plan Discharge plan remains appropriate;All goals met and education completed, patient discharged from OT services    Co-evaluation                 AM-PAC OT "6 Clicks" Daily Activity     Outcome Measure   Help from another person eating meals?: None Help from another person taking care of personal grooming?: None Help from another person toileting, which includes using toliet, bedpan, or urinal?: None Help from another person bathing (including washing, rinsing, drying)?: None Help from another person to put on and taking off regular upper body clothing?: None Help from another person to put on and taking off regular lower body clothing?: None 6 Click Score: 24    End of Session    OT Visit Diagnosis: Unsteadiness on feet (R26.81);Muscle weakness (generalized) (M62.81);Low vision, both eyes (H54.2);Other symptoms and signs involving cognitive function   Activity Tolerance Patient tolerated treatment well   Patient Left in bed;with call bell/phone within reach   Nurse Communication Mobility status        Time: 6160-7371 OT Time Calculation (min): 21 min  Charges: OT General Charges $OT Visit: 1 Visit OT Treatments $Therapeutic Activity: 8-22 mins  Lynnda Child, OTD, OTR/L Acute Rehab [phone]) [phone] - [place] 04/16/2022, 4:45 PM

## 2022-04-17 DIAGNOSIS — I2510 Atherosclerotic heart disease of native coronary artery without angina pectoris: Secondary | ICD-10-CM

## 2022-04-17 DIAGNOSIS — I159 Secondary hypertension, unspecified: Secondary | ICD-10-CM | POA: Diagnosis not present

## 2022-04-17 DIAGNOSIS — E785 Hyperlipidemia, unspecified: Secondary | ICD-10-CM | POA: Diagnosis not present

## 2022-04-17 DIAGNOSIS — I639 Cerebral infarction, unspecified: Secondary | ICD-10-CM | POA: Diagnosis not present

## 2022-04-17 LAB — BASIC METABOLIC PANEL
Anion gap: 8 (ref 5–15)
BUN: 10 mg/dL (ref 8–23)
CO2: 24 mmol/L (ref 22–32)
Calcium: 9.4 mg/dL (ref 8.9–10.3)
Chloride: 108 mmol/L (ref 98–111)
Creatinine, Ser: 1.01 mg/dL — ABNORMAL HIGH (ref 0.44–1.00)
GFR, Estimated: 57 mL/min — ABNORMAL LOW (ref >60)
Glucose, Bld: 123 mg/dL — ABNORMAL HIGH (ref 70–99)
Potassium: 4.3 mmol/L (ref 3.5–5.1)
Sodium: 140 mmol/L (ref 135–145)

## 2022-04-17 LAB — CBC
HCT: 42.9 % (ref 36.0–46.0)
Hemoglobin: 13.8 g/dL (ref 12.0–15.0)
MCH: 27.3 pg (ref 26.0–34.0)
MCHC: 32.2 g/dL (ref 30.0–36.0)
MCV: 84.8 fL (ref 80.0–100.0)
Platelets: 289 10*3/uL (ref 150–400)
RBC: 5.06 MIL/uL (ref 3.87–5.11)
RDW: 14 % (ref 11.5–15.5)
WBC: 5.9 10*3/uL (ref 4.0–10.5)
nRBC: 0 % (ref 0.0–0.2)

## 2022-04-17 LAB — MAGNESIUM: Magnesium: 2.3 mg/dL (ref 1.7–2.4)

## 2022-04-17 MED ORDER — CLOPIDOGREL BISULFATE 75 MG PO TABS: 75.0000 mg | ORAL_TABLET | Freq: Every day | ORAL | 3 refills | Status: DC

## 2022-04-17 MED ORDER — ASPIRIN 81 MG PO CHEW: 81.0000 mg | CHEWABLE_TABLET | Freq: Every day | ORAL | 0 refills | Status: AC

## 2022-04-17 MED ORDER — METOPROLOL SUCCINATE ER 25 MG PO TB24: 75.0000 mg | ORAL_TABLET | Freq: Two times a day (BID) | ORAL | 2 refills | Status: DC

## 2022-04-17 NOTE — Discharge Summary (Signed)
Physician Discharge Summary   Patient: Paula Wong MRN: [id number] DOB: Sep 22, 1945  Admit date:     04/12/2022  Discharge date: 04/17/22  Discharge Physician: Oswald Hillock   PCP: Arthur Holms, NP   Recommendations at discharge:   Follow-up cardiology as outpatient Follow-up neurology as outpatient in 2 months  Discharge Diagnoses: Principal Problem:   Acute CVA (cerebrovascular accident) [facility]) Active Problems:   Hypertension   Diabetes ([place])   Hyperlipidemia   Chronic systolic CHF (congestive heart failure) (HCC)   Abnormal cardiac CT angiography   Coronary artery disease involving native coronary artery of native heart without angina pectoris   Ischemic stroke ([place])  Resolved Problems:   * No resolved hospital problems. *  Hospital Course:  76 y.o. female with medical history significant for type 2 diabetes, hypertension, hyperlipidemia, HFrEF 20 to 25%, presented to hospital with abnormal CT scan findings by her primary care physician.  Patient had been having moderate to severe headache for which PCP had ordered a CT scan.  In the ED, patient was noted to have subacute cortical infarct  involving the inferior medial left occipital pole with petechial hemorrhage patient was asked to hospital for further evaluation and treatment.   During hospitalization, patient was seen by cardiology for reduced LV function and CT coronary was performed with coronary calcium score of 2553 with severe stenosis.  Patient then underwent cardiac catheterization on 04/16/2022.  Patient also had loop recorder placed in on 04/15/2022.  Assessment and Plan:  Subacute ischemic CVA:   CT scan done outpatient showed edema in the left occipital lobe so MRI was done in the hospital which showed subacute cortical infarct involving the inferior and medial left occipital pole with petechial hemorrhage.  No large vessel occlusion on the CTA of the head and neck.  Hemoglobin A1c of 6.3.  LDL of 70.  2D  echocardiogram with LV ejection fraction of 20 to 25% with severely decreased function and global hypokinesis and grade 1 diastolic dysfunction with positive bubble study.  Physical therapy was held due to recommended outpatient PT. patient is currently on Lipitor, aspirin and Plavix.  Neurology recommended loop recorder and has received loop on 04/15/2022.  Neurology recommends aspirin and Plavix for 3 weeks followed by Plavix alone indefinitely on discharge.  Recommendations to follow-up in outpatient stroke clinic in 2 months.   Uncontrolled hypertension:  Amlodipine was discontinued.  Currently on metoprolol, Entresto and Aldactone.  Closely monitor blood pressure.  Controlled at this time.   Type 2 diabetes mellitus: Hemoglobin A1c of 6.3.  Continue home medications   Chronic systolic congestive heart failure: Compensated.  Of note patient did have 2D echocardiogram on 12/09/2021 which showed LV ejection fraction of 20 to 25% with global hypokinesis.  Patient was not aware of this finding.  Cardiology on board and patient underwent CT coronaries and cardiac cath.  Cardiac cath reported mid LAD stenosis of 70% stenosis.  Patient did have significant LV dysfunction out of proportion to her CAD.  No evidence of angina. Patient is currently on aspirin Plavix statins, farxiga, Entresto.  Aspirin will be discontinued after 3 weeks and patient to continue taking Plavix 75 mg daily.  Patient will continue taking Pravachol        Consultants: Cardiology, neurology Procedures performed: Loop recorder placement Disposition: Home Diet recommendation:  Discharge Diet Orders (From admission, onward)     Start     Ordered   04/17/22 0000  Diet - low sodium heart healthy  04/17/22 1349           Cardiac diet DISCHARGE MEDICATION: Allergies as of 04/17/2022   No Known Allergies      Medication List     STOP taking these medications    amLODipine 10 MG tablet Commonly known as:  NORVASC   hydrochlorothiazide 25 MG tablet Commonly known as: HYDRODIURIL   metoprolol tartrate 50 MG tablet Commonly known as: LOPRESSOR       TAKE these medications    aspirin 81 MG chewable tablet Chew 1 tablet (81 mg total) by mouth daily for 21 days. Stop Aspirin after 3 weeks and continue with Plavix indefinitely Start taking on: April 18, 2022   clopidogrel 75 MG tablet Commonly known as: PLAVIX Take 1 tablet (75 mg total) by mouth daily. Start taking on: April 18, 2022   Entresto 24-26 MG Generic drug: sacubitril-valsartan Take 1 tablet by mouth 2 (two) times daily.   Farxiga 10 MG Tabs tablet Generic drug: dapagliflozin propanediol Take 10 mg by mouth daily.   gabapentin 300 MG capsule Commonly known as: NEURONTIN Take by mouth.   magnesium oxide 400 MG tablet Commonly known as: MAG-OX Take 400 mg by mouth daily.   metFORMIN 500 MG 24 hr tablet Commonly known as: GLUCOPHAGE-XR Take 500 mg by mouth 2 (two) times daily.   metoprolol succinate 25 MG 24 hr tablet Commonly known as: TOPROL-XL Take 3 tablets (75 mg total) by mouth 2 (two) times daily.   multivitamin tablet Take 1 tablet by mouth daily.   Ozempic (0.25 or 0.5 MG/DOSE) 2 MG/1.5ML Sopn Generic drug: Semaglutide(0.25 or 0.'5MG'$ /DOS) Inject 0.5 mg into the skin once a week.   pantoprazole 20 MG tablet Commonly known as: PROTONIX Take 1 tablet (20 mg total) by mouth daily.   pravastatin 20 MG tablet Commonly known as: PRAVACHOL Take 20 mg by mouth daily.   spironolactone 25 MG tablet Commonly known as: ALDACTONE Take 12.5 mg by mouth daily.   TRADJENTA PO Take 1 capsule by mouth daily.   Tyler Aas FlexTouch 200 UNIT/ML FlexTouch Pen Generic drug: insulin degludec Take 80 Units by mouth at bedtime.        Follow-up Information     Nahser, Wonda Cheng, MD Follow up on 05/02/2022.   Specialty: Cardiology Why: at 11:20 AM - please arrive 10-15 min early to check in. Contact  information: [place] [address] [phone]         Garvin Fila, MD Follow up in 2 month(s).   Specialties: Neurology, Radiology Contact information: [address] [place]  [phone]                Discharge Exam: Danley Danker Weights   04/14/22 0500 04/16/22 0400 04/17/22 0500  Weight: 88.3 kg 84.5 kg 84.2 kg   General-appears in no acute distress Heart-S1-S2, regular, no murmur auscultated Lungs-clear to auscultation bilaterally, no wheezing or crackles auscultated Abdomen-soft, nontender, no organomegaly Extremities-no edema in the lower extremities Neuro-alert, oriented x3, no focal deficit noted  Condition at discharge: good  The results of significant diagnostics from this hospitalization (including imaging, microbiology, ancillary and laboratory) are listed below for reference.   Imaging Studies: CARDIAC CATHETERIZATION  Result Date: 04/16/2022   Ost LM lesion is 40% stenosed.   Mid LM to Dist LM lesion is 30% stenosed.   Ost LAD to Prox LAD lesion is 40% stenosed.   Mid LAD-1 lesion is 80% stenosed.   Mid  LAD-2 lesion is 30% stenosed.   RV Branch lesion is 70% stenosed.   Mid Cx lesion is 5% stenosed. Significant multi vessel coronary calcification. The left main has 40% ostial 30% distal stenosis; the LAD has 40% ostial to proximal stenosis followed by focal calcified 80% stenosis prior to the takeoff of a septal perforator and the second diagonal vessel with 30% mid stenosis; mild nonobstructive disease in the left circumflex vessel, and diffuse 70% stenosis in the marginal branch of the RCA. LVEDP is elevated at 22 mmHg. RECOMMENDATION: Patient has significant LV dysfunction out of proportion to her or significant LAD stenosis.  She has been without anginal symptomatology.  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  Consider addition of anti-ischemic medical  therapy with 80% LAD stenosis.  Will ask colleagues to review.  If patient has increasing symptomatology consider PCI to LAD with possible shockwave or CSI atherectomy if needed.   CT CORONARY MORPH W/CTA COR W/SCORE W/CA W/CM &/OR WO/CM  Addendum Date: 04/16/2022   ADDENDUM REPORT: 04/16/2022 09:40 EXAM: OVER-READ INTERPRETATION  CT CHEST The following report is a limited chest CT over-read performed by radiologist Dr. Lindaann Slough [facility] on 04/16/2022. This over-read does not include interpretation of cardiac or coronary anatomy or pathology. The coronary CTA interpretation by the cardiologist is attached. COMPARISON:  None. FINDINGS: Mediastinum/Nodes: No enlarged lymph nodes within the visualized mediastinum. Lungs/Pleura: There is no pleural effusion. The visualized lungs appear clear. Upper abdomen: No significant findings in the visualized upper abdomen. Musculoskeletal/Chest wall: No chest wall mass or suspicious osseous findings within the visualized chest. IMPRESSION: No significant extracardiac findings within the visualized chest. Electronically Signed   By: Richardean Sale M.D.   On: 04/16/2022 09:40   Result Date: 04/16/2022 CLINICAL DATA:  76 Year old African American Female EXAM: Cardiac/Coronary  CTA TECHNIQUE: The patient was scanned on a [employer]. FINDINGS: Scan was triggered in the descending thoracic aorta. Axial non-contrast 3 mm slices were carried out through the heart. The data set was analyzed on a dedicated work station and scored using the Archer. Gantry rotation speed was 250 msecs and collimation was .6 mm. 0.8 mg of sl NTG was given. The 3D data set was reconstructed in 5% intervals of the 67-82 % of the R-R cycle. Diastolic phases were analyzed on a dedicated work station using MPR, MIP and VRT modes. The patient received 95 cc of contrast. Coronary Arteries:  Normal coronary origin.  Co-dominance. Coronary Calcium Score: Left main: 896  Left anterior descending artery: 1050 Left circumflex artery: 304 Right coronary artery: 152 Ramus intermedius artery: 150 Total: 2553 Percentile: 99th for age, sex, and race matched control. RCA is an artery that gives rise to PDA and PLA. Minimal calcified plaque (1-24%) in the ostial RCA. Mild soft plaque (25-49%) mid RCA. Minimal calcified plaque (1-24%) in the distal RCA. Left main is a large artery that gives rise to LAD, RI, and LCX arteries. Mild calcified plaque (25-49%) proximal left main. LAD is a large vessel that gives rise to two diagonal vessels. Moderate stenosis, calcified plaque, (50-70%) ostial LAD. Moderate stenosis, calcified plaque, (50-70%) proximal LAD. There are two severe mixed plaque stenosis (70-99%) in the mid LAD. Severe mixed plaque stenosis (70-99%) distal LAD LCX is a co-dominant artery that gives rise to multiple OM vessels and a small L-PDA. Mild calcified plaque (25-49%) proximal RCA. Ramus intermedius artery with mild, mixed  plaque in the proximal vessel then a severe mixed plaque stenosis (70-99%) in the mid body. Vessel is diffusely diseased. Other findings: Aorta: Normal size.  Aortic atherosclerosis.  No dissection. Main Pulmonary Artery: Severe dilation 35 mm. Aortic Valve:  Tri-leaflet.  No calcifications. Normal pulmonary vein drainage into the left atrium. Normal left atrial appendage without a thrombus. Small PFO. Severe left ventricular dilation. Small pericardial effusion anterior to the right ventricle an lateral to the left ventricle Extra-cardiac findings: See attached radiology report for non-cardiac structures. IMPRESSION: 1. Coronary calcium score of 2553. This was 99th percentile for age, sex, and race matched control. 2. Normal coronary origin with co-dominance. 3. CAD-RADS 4 Severe stenosis. (70-99% or > 50% left main). CT FFR is will be sent. Consider symptom-guided anti-ischemic pharmacotherapy as well as risk factor modification per guideline directed care.  4.  Aortic atherosclerosis. 5. Severe left ventricular dilation. RECOMMENDATIONS: Coronary artery calcium (CAC) score is a strong predictor of incident coronary heart disease (CHD) and provides predictive information beyond traditional risk factors. CAC scoring is reasonable to use in the decision to withhold, postpone, or initiate statin therapy in intermediate-risk or selected borderline-risk asymptomatic adults (age 32-75 years and LDL-C >=70 to <190 mg/dL) who do not have diabetes or established atherosclerotic cardiovascular disease (ASCVD).* In intermediate-risk (10-year ASCVD risk >=7.5% to <20%) adults or selected borderline-risk (10-year ASCVD risk >=5% to <7.5%) adults in whom a CAC score is measured for the purpose of making a treatment decision the following recommendations have been made: If CAC = 0, it is reasonable to withhold statin therapy and reassess in 5 to 10 years, as long as higher risk conditions are absent (diabetes mellitus, family history of premature CHD in first degree relatives (males <55 years; females <65 years), cigarette smoking, LDL >=190 mg/dL or other independent risk factors). If CAC is 1 to 99, it is reasonable to initiate statin therapy for patients >=9 years of age. If CAC is >=100 or >=75th percentile, it is reasonable to initiate statin therapy at any age. Cardiology referral should be considered for patients with CAC scores =400 or >=75th percentile. *2018 AHA/ACC/AACVPR/AAPA/ABC/ACPM/ADA/AGS/APhA/ASPC/NLA/PCNA Guideline on the Management of Blood Cholesterol: A Report of the American College of Cardiology/American Heart Association Task Force on Clinical Practice Guidelines. J Am Coll Cardiol. 2019;73(24):3168-3209. Rudean Haskell, MD Electronically Signed: By: Rudean Haskell M.D. On: 04/15/2022 16:46   EP PPM/ICD IMPLANT  Result Date: 04/15/2022 CONCLUSIONS:  1. Successful implantation of a implantable loop recorder for Cryptogenic stroke  2. No early  apparent complications.   CT CORONARY FRACTIONAL FLOW RESERVE DATA PREP  Result Date: 04/15/2022 EXAM: CT-FFR ANALYSIS CLINICAL DATA:  Possibly obstructive coronary lesion: LAD and RI FINDINGS: CT-FFR analysis was performed on the original cardiac CT angiogram dataset. Diagrammatic representation of the CT-FFR analysis is provided in a separate PDF document in PACS. This dictation was created using the PDF document and an interactive 3D model of the results. 3D model is not available in the EMR/PACS. Normal FFR range is >0.80. 1. Left Main: No significant functional stenosis, CT-FFR 0.97. 2. LAD: significant functional stenosis, CT-FFR 0.93 in proximal LAD, 0.79 at mid LAD, and 0.70 at distal LAD lesion. 3. LCX: No significant functional stenosis, CT-FFR 0.96 at proximal LCX, 0.82 distal vessel. 4. RCA: No significant functional stenosis, CT-FFR 0.81 distal vessel. 5. RI: No significant functional stenosis, CT-FFR 0.96 at lesion. Provider note: In companion Cardiac CT, LCX is a co-dominant artery that gives rise to multiple OM vessels  and a small L-PDA. Mild calcified plaque (25-49%) proximal LCX. Dictation inappropriately noted RCA (there is a mild calcified plaque also in the RCA). IMPRESSION: 1. CT FFR analysis shows evidence of significant functional stenosis in the LAD but not in the RI. Rudean Haskell MD Electronically Signed   By: Rudean Haskell M.D.   On: 04/15/2022 18:58   CT CORONARY FRACTIONAL FLOW RESERVE FLUID ANALYSIS  Result Date: 04/15/2022 EXAM: CT-FFR ANALYSIS CLINICAL DATA:  Possibly obstructive coronary lesion: LAD and RI FINDINGS: CT-FFR analysis was performed on the original cardiac CT angiogram dataset. Diagrammatic representation of the CT-FFR analysis is provided in a separate PDF document in PACS. This dictation was created using the PDF document and an interactive 3D model of the results. 3D model is not available in the EMR/PACS. Normal FFR range is >0.80. 1. Left  Main: No significant functional stenosis, CT-FFR 0.97. 2. LAD: significant functional stenosis, CT-FFR 0.93 in proximal LAD, 0.79 at mid LAD, and 0.70 at distal LAD lesion. 3. LCX: No significant functional stenosis, CT-FFR 0.96 at proximal LCX, 0.82 distal vessel. 4. RCA: No significant functional stenosis, CT-FFR 0.81 distal vessel. 5. RI: No significant functional stenosis, CT-FFR 0.96 at lesion. Provider note: In companion Cardiac CT, LCX is a co-dominant artery that gives rise to multiple OM vessels and a small L-PDA. Mild calcified plaque (25-49%) proximal LCX. Dictation inappropriately noted RCA (there is a mild calcified plaque also in the RCA). IMPRESSION: 1. CT FFR analysis shows evidence of significant functional stenosis in the LAD but not in the RI. Rudean Haskell MD Electronically Signed   By: Rudean Haskell M.D.   On: 04/15/2022 18:58   VAS [place] LOWER EXTREMITY VENOUS (DVT)  Result Date: 04/14/2022  Lower Venous DVT Study Patient Name:  EVAMARIA DETORE  Date of Exam:   04/14/2022 Medical Rec #: [id number]         Accession #:    [id number] Date of Birth: [birth year]          Patient Gender: F Patient Age:   39 years Exam Location:  [facility] Procedure:      VAS [place] LOWER EXTREMITY VENOUS (DVT) Referring Phys: Janine Ores --------------------------------------------------------------------------------  Indications: Stroke, and PFO.  Performing Technologist: Sharion Dove RVS  Examination Guidelines: A complete evaluation includes B-mode imaging, spectral Doppler, color Doppler, and power Doppler as needed of all accessible portions of each vessel. Bilateral testing is considered an integral part of a complete examination. Limited examinations for reoccurring indications may be performed as noted. The reflux portion of the exam is performed with the patient in reverse Trendelenburg.  +---------+---------------+---------+-----------+----------+--------------+ RIGHT     CompressibilityPhasicitySpontaneityPropertiesThrombus Aging +---------+---------------+---------+-----------+----------+--------------+ CFV      Full           Yes      Yes                                 +---------+---------------+---------+-----------+----------+--------------+ SFJ      Full                                                        +---------+---------------+---------+-----------+----------+--------------+ FV Prox  Full                                                        +---------+---------------+---------+-----------+----------+--------------+  FV Mid   Full                                                        +---------+---------------+---------+-----------+----------+--------------+ FV DistalFull                                                        +---------+---------------+---------+-----------+----------+--------------+ PFV      Full                                                        +---------+---------------+---------+-----------+----------+--------------+ POP      Full           Yes      Yes                                 +---------+---------------+---------+-----------+----------+--------------+ PTV      Full                                                        +---------+---------------+---------+-----------+----------+--------------+ PERO     Full                                                        +---------+---------------+---------+-----------+----------+--------------+   +---------+---------------+---------+-----------+----------+--------------+ LEFT     CompressibilityPhasicitySpontaneityPropertiesThrombus Aging +---------+---------------+---------+-----------+----------+--------------+ CFV      Full           Yes      Yes                                 +---------+---------------+---------+-----------+----------+--------------+ SFJ      Full                                                         +---------+---------------+---------+-----------+----------+--------------+ FV Prox  Full                                                        +---------+---------------+---------+-----------+----------+--------------+ FV Mid   Full                                                        +---------+---------------+---------+-----------+----------+--------------+  FV DistalFull                                                        +---------+---------------+---------+-----------+----------+--------------+ PFV      Full                                                        +---------+---------------+---------+-----------+----------+--------------+ POP      Full           Yes      Yes                                 +---------+---------------+---------+-----------+----------+--------------+ PTV      Full                                                        +---------+---------------+---------+-----------+----------+--------------+ PERO     Full                                                        +---------+---------------+---------+-----------+----------+--------------+     Summary: BILATERAL: - No evidence of deep vein thrombosis seen in the lower extremities, bilaterally. -No evidence of popliteal cyst, bilaterally.   *See table(s) above for measurements and observations. Electronically signed by Harold Barban MD on 04/14/2022 at 8:48:30 PM.    Final    ECHOCARDIOGRAM COMPLETE BUBBLE STUDY  Result Date: 04/13/2022    ECHOCARDIOGRAM REPORT   Patient Name:   Ahlivia Salahuddin Date of Exam: 04/13/2022 Medical Rec #:  [id number]        Height:       64.0 in Accession #:    [id number]       Weight:       190.0 lb Date of Birth:  1945/08/20         BSA:          1.914 m Patient Age:    4 years         BP:           114/76 mmHg Patient Gender: F                HR:           90 bpm. Exam Location:  Inpatient Procedure: 2D Echo, Cardiac Doppler, Color  Doppler and Saline Contrast Bubble            Study Indications:    Stroke  History:        Patient has prior history of Echocardiogram examinations, most                 recent 12/04/2021. Risk Factors:Hypertension, Diabetes and                 Dyslipidemia.  Sonographer:  Merrie Roof RDCS Referring Phys: Lorenda Cahill HALL IMPRESSIONS  1. Left ventricular ejection fraction, by estimation, is 20 to 25%. Left ventricular ejection fraction by 2D MOD biplane is 21.5 %. The left ventricle has severely decreased function. The left ventricle demonstrates global hypokinesis. There is mild left ventricular hypertrophy. Left ventricular diastolic parameters are consistent with Grade I diastolic dysfunction (impaired relaxation). There is incoordinate septal motion.  2. Right ventricular systolic function is low normal. The right ventricular size is normal. Tricuspid regurgitation signal is inadequate for assessing PA pressure.  3. The mitral valve is abnormal. Mild mitral valve regurgitation.  4. The aortic valve is tricuspid. Aortic valve regurgitation is trivial. Aortic valve sclerosis is present, with no evidence of aortic valve stenosis.  5. Aortic dilatation noted. There is mild dilatation of the aortic root, measuring 40 mm.  6. The inferior vena cava is normal in size with greater than 50% respiratory variability, suggesting right atrial pressure of 3 mmHg.  7. Agitated saline contrast bubble study was positive with shunting observed after >6 cardiac cycles suggestive of intrapulmonary shunting. No atrial level shunting was noted. Comparison(s): No significant change from prior study. 12/04/2021: LVEF 20-25%. FINDINGS  Left Ventricle: Left ventricular ejection fraction, by estimation, is 20 to 25%. Left ventricular ejection fraction by 2D MOD biplane is 21.5 %. The left ventricle has severely decreased function. The left ventricle demonstrates global hypokinesis. The left ventricular internal cavity size was normal in  size. There is mild left ventricular hypertrophy. Incoordinate septal motion. Left ventricular diastolic parameters are consistent with Grade I diastolic dysfunction (impaired relaxation). Indeterminate  filling pressures. Right Ventricle: The right ventricular size is normal. No increase in right ventricular wall thickness. Right ventricular systolic function is low normal. Tricuspid regurgitation signal is inadequate for assessing PA pressure. Left Atrium: Left atrial size was normal in size. Right Atrium: Right atrial size was normal in size. Pericardium: There is no evidence of pericardial effusion. Mitral Valve: The mitral valve is abnormal. Mild mitral annular calcification. Mild mitral valve regurgitation. Tricuspid Valve: The tricuspid valve is grossly normal. Tricuspid valve regurgitation is trivial. Aortic Valve: The aortic valve is tricuspid. Aortic valve regurgitation is trivial. Aortic valve sclerosis is present, with no evidence of aortic valve stenosis. Aortic valve mean gradient measures 2.0 mmHg. Aortic valve peak gradient measures 3.3 mmHg. Aortic valve area, by VTI measures 2.12 cm. Pulmonic Valve: The pulmonic valve was grossly normal. Pulmonic valve regurgitation is trivial. Aorta: Aortic dilatation noted. There is mild dilatation of the aortic root, measuring 40 mm. Venous: The inferior vena cava is normal in size with greater than 50% respiratory variability, suggesting right atrial pressure of 3 mmHg. IAS/Shunts: No atrial level shunt detected by color flow Doppler. Agitated saline contrast was given intravenously to evaluate for intracardiac shunting. Agitated saline contrast bubble study was positive with shunting observed after >6 cardiac cycles suggestive of intrapulmonary shunting.  LEFT VENTRICLE PLAX 2D                        Biplane EF (MOD) LVIDd:         5.30 cm         LV Biplane EF:   Left LVIDs:         4.80 cm                          ventricular LV PW:  1.20 cm                           ejection LV IVS:        1.20 cm                          fraction by LVOT diam:     2.10 cm                          2D MOD LV SV:         35                               biplane is LV SV Index:   18                               21.5 %. LVOT Area:     3.46 cm                                Diastology                                LV e' medial:    2.76 cm/s LV Volumes (MOD)               LV E/e' medial:  19.4 LV vol d, MOD    143.0 ml      LV e' lateral:   2.76 cm/s A2C:                           LV E/e' lateral: 19.4 LV vol d, MOD    147.0 ml A4C: LV vol s, MOD    112.0 ml A2C: LV vol s, MOD    110.0 ml A4C: LV SV MOD A2C:   31.0 ml LV SV MOD A4C:   147.0 ml LV SV MOD BP:    32.1 ml RIGHT VENTRICLE RV Basal diam:  3.10 cm RV S prime:     10.70 cm/s TAPSE (M-mode): 1.4 cm LEFT ATRIUM             Index        RIGHT ATRIUM           Index LA diam:        3.00 cm 1.57 cm/m   RA Area:     12.70 cm LA Vol (A2C):   72.7 ml 37.98 ml/m  RA Volume:   30.70 ml  16.04 ml/m LA Vol (A4C):   43.9 ml 22.93 ml/m LA Biplane Vol: 57.0 ml 29.78 ml/m  AORTIC VALVE AV Area (Vmax):    2.34 cm AV Area (Vmean):   2.19 cm AV Area (VTI):     2.12 cm AV Vmax:           91.40 cm/s AV Vmean:          62.600 cm/s AV VTI:            0.167 m AV Peak Grad:      3.3 mmHg AV Mean Grad:      2.0 mmHg LVOT Vmax:  61.80 cm/s LVOT Vmean:        39.500 cm/s LVOT VTI:          0.102 m LVOT/AV VTI ratio: 0.61  AORTA Ao Root diam: 4.00 cm Ao Asc diam:  3.20 cm MITRAL VALVE MV Area (PHT): 3.72 cm     SHUNTS MV Decel Time: 204 msec     Systemic VTI:  0.10 m MV E velocity: 53.50 cm/s   Systemic Diam: 2.10 cm MV A velocity: 102.00 cm/s MV E/A ratio:  0.52 Lyman Bishop MD Electronically signed by Lyman Bishop MD Signature Date/Time: 04/13/2022/4:49:00 PM    Final    CT ANGIO HEAD NECK W WO CM  Result Date: 04/13/2022 CLINICAL DATA:  Hemorrhagic stroke EXAM: CT ANGIOGRAPHY HEAD AND NECK TECHNIQUE: Multidetector CT imaging  of the head and neck was performed using the standard protocol during bolus administration of intravenous contrast. Multiplanar CT image reconstructions and MIPs were obtained to evaluate the vascular anatomy. Carotid stenosis measurements (when applicable) are obtained utilizing NASCET criteria, using the distal internal carotid diameter as the denominator. RADIATION DOSE REDUCTION: This exam was performed according to the departmental dose-optimization program which includes automated exposure control, adjustment of the mA and/or kV according to patient size and/or use of iterative reconstruction technique. CONTRAST:  77m OMNIPAQUE IOHEXOL 350 MG/ML SOLN COMPARISON:  None Available. FINDINGS: CTA NECK FINDINGS SKELETON: There is no bony spinal canal stenosis. No lytic or blastic lesion. OTHER NECK: Normal pharynx, larynx and major salivary glands. No cervical lymphadenopathy. Unremarkable thyroid gland. UPPER CHEST: No pneumothorax or pleural effusion. No nodules or masses. AORTIC ARCH: There is calcific atherosclerosis of the aortic arch. There is no aneurysm, dissection or hemodynamically significant stenosis of the visualized portion of the aorta. Conventional 3 vessel aortic branching pattern. The visualized proximal subclavian arteries are widely patent. RIGHT CAROTID SYSTEM: Normal without aneurysm, dissection or stenosis. LEFT CAROTID SYSTEM: No dissection, occlusion or aneurysm. There is mixed density atherosclerosis extending into the proximal ICA, resulting in less than 50% stenosis. VERTEBRAL ARTERIES: Codominant configuration. Both origins are clearly patent. There is no dissection, occlusion or flow-limiting stenosis to the skull base (V1-V3 segments). CTA HEAD FINDINGS POSTERIOR CIRCULATION: --Vertebral arteries: Normal V4 segments. --Inferior cerebellar arteries: Normal. --Basilar artery: Normal. --Superior cerebellar arteries: Normal. --Posterior cerebral arteries (PCA): Normal. ANTERIOR  CIRCULATION: --Intracranial internal carotid arteries: Atherosclerotic calcification of the internal carotid arteries at the skull base without hemodynamically significant stenosis. --Anterior cerebral arteries (ACA): Normal. Both A1 segments are present. Patent anterior communicating artery (a-comm). --Middle cerebral arteries (MCA): Normal. VENOUS SINUSES: As permitted by contrast timing, patent. ANATOMIC VARIANTS: None Review of the MIP images confirms the above findings. IMPRESSION: 1. No emergent large vessel occlusion or hemodynamically significant stenosis of the head or neck. 2. Aortic Atherosclerosis (ICD10-I70.0). Electronically Signed   By: KUlyses JarredM.D.   On: 04/13/2022 03:10   MR BRAIN W WO CONTRAST  Result Date: 04/12/2022 CLINICAL DATA:  Intermittent headache and blurred vision 5 days. Abnormal head CT. EXAM: MRI HEAD WITHOUT AND WITH CONTRAST TECHNIQUE: Multiplanar, multiecho pulse sequences of the brain and surrounding structures were obtained without and with intravenous contrast. CONTRAST:  8.561mGADAVIST GADOBUTROL 1 MMOL/ML IV SOLN COMPARISON:  CT head without contrast 04/11/2022 FINDINGS: Brain: Diffusion-weighted images demonstrate restricted cortical diffusion in the inferior and medial left occipital pole. T2 and FLAIR hyperintensities are associated. Susceptibility is compatible with petechial hemorrhage. Moderate generalized atrophy and scattered white matter changes are otherwise present. No other acute  infarct is present. T2 hyperintensities in the thalami bilaterally are consistent with remote ischemic change. Brainstem and cerebellum are within normal limits. The internal auditory canals are within normal limits. The ventricles are proportionate to the degree of atrophy. No significant extraaxial fluid collection is present. Vascular: Flow is present in the major intracranial arteries. Skull and upper cervical spine: A prominent soft disc protrusion at C4-5 displaces the cord  posteriorly. Craniocervical junction is normal. Marrow signal is within normal limits. Sinuses/Orbits: The paranasal sinuses and mastoid air cells are clear. The globes and orbits are within normal limits. IMPRESSION: 1. 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 2. Moderate generalized atrophy and white matter disease likely reflects the sequela of chronic microvascular ischemia. 3. Prominent soft disc protrusion at C4-5 displaces the cord posteriorly. Consider MRI of the cervical spine without contrast if the patient is symptomatic. These results were called by telephone at the time of interpretation on 04/12/2022 at 10:00 pm to provider [facility] who verbally acknowledged these results. Electronically Signed   By: San Morelle M.D.   On: 04/12/2022 22:02   CT HEAD WO CONTRAST (5MM)  Result Date: 04/12/2022 CLINICAL DATA:  TIA (transient ischemic attack) EXAM: CT HEAD WITHOUT CONTRAST TECHNIQUE: Contiguous axial images were obtained from the base of the skull through the vertex without intravenous contrast. RADIATION DOSE REDUCTION: This exam was performed according to the departmental dose-optimization program which includes automated exposure control, adjustment of the mA and/or kV according to patient size and/or use of iterative reconstruction technique. COMPARISON:  None Available. FINDINGS: Brain: Edema in the left occipital lobe. No evidence of acute hemorrhage. No definite evidence of mass lesion on this noncontrast head CT. No midline shift. No hydrocephalus. Mild additional patchy white matter hypoattenuation, nonspecific but compatible with chronic microvascular ischemic disease. Mild for age cerebral atrophy. Vascular: No hyperdense vessel identified. Skull: No acute fracture. Sinuses/Orbits: Clear sinuses.  No acute orbital findings. Other: No mastoid effusions. IMPRESSION: Edema in the left occipital lobe. This could  potentially represent an acute or subacute left PCA territory infarct; however, the edema pattern is somewhat atypical (appearing more vasogenic than cytotoxic). Therefore, recommend MRI (preferably with contrast) to further evaluate and exclude other etiologies. These results will be called to the ordering clinician or representative by the Radiologist Assistant, and communication documented in the PACS or [employer]. Electronically Signed   By: Margaretha Sheffield M.D.   On: 04/12/2022 16:07   VAS US CAROTID  Result Date: 04/11/2022 Carotid Arterial Duplex Study Patient Name:  TYJAH HAI  Date of Exam:   04/11/2022 Medical Rec #: [id number]         Accession #:    [id number] Date of Birth: 02/02/1946          Patient Gender: F Patient Age:   37 years Exam Location:  [facility] Procedure:      VAS US CAROTID Referring Phys: Arthur Holms --------------------------------------------------------------------------------  Indications:       TIA. Risk Factors:      Hypertension, hyperlipidemia, Diabetes. Limitations        Today's exam was limited due to the body habitus of the                    patient and the patient's respiratory variation. Comparison Study:  No prior studies. Performing Technologist: Oliver Hum RVT  Examination Guidelines: A complete evaluation includes B-mode imaging, spectral Doppler, color Doppler,  and power Doppler as needed of all accessible portions of each vessel. Bilateral testing is considered an integral part of a complete examination. Limited examinations for reoccurring indications may be performed as noted.  Right Carotid Findings: +----------+--------+--------+--------+-----------------------+--------+           PSV cm/sEDV cm/sStenosisPlaque Description     Comments +----------+--------+--------+--------+-----------------------+--------+ CCA Prox  61      12              smooth and heterogenous          +----------+--------+--------+--------+-----------------------+--------+ CCA Distal72      15              smooth and heterogenous         +----------+--------+--------+--------+-----------------------+--------+ ICA Prox  57      19              smooth and heterogenoustortuous +----------+--------+--------+--------+-----------------------+--------+ ICA Distal59      21                                     tortuous +----------+--------+--------+--------+-----------------------+--------+ ECA       53      13                                              +----------+--------+--------+--------+-----------------------+--------+ +----------+--------+-------+--------+-------------------+           PSV cm/sEDV cmsDescribeArm Pressure (mmHG) +----------+--------+-------+--------+-------------------+ [id number]                                         +----------+--------+-------+--------+-------------------+ +---------+--------+--+--------+--+---------+ VertebralPSV cm/s33EDV cm/s10Antegrade +---------+--------+--+--------+--+---------+  Left Carotid Findings: +----------+--------+--------+--------+-----------------------+--------+           PSV cm/sEDV cm/sStenosisPlaque Description     Comments +----------+--------+--------+--------+-----------------------+--------+ CCA Prox  74      19              smooth and heterogenous         +----------+--------+--------+--------+-----------------------+--------+ CCA Distal51      16              smooth and heterogenous         +----------+--------+--------+--------+-----------------------+--------+ ICA Prox  51      17              smooth and heterogenous         +----------+--------+--------+--------+-----------------------+--------+ ICA Distal44      16                                     tortuous +----------+--------+--------+--------+-----------------------+--------+ ECA       35      5                                                +----------+--------+--------+--------+-----------------------+--------+ +----------+--------+--------+--------+-------------------+           PSV cm/sEDV cm/sDescribeArm Pressure (mmHG) +----------+--------+--------+--------+-------------------+ Subclavian140                                         +----------+--------+--------+--------+-------------------+ +---------+--------+--+--------+-+---------+  VertebralPSV cm/s21EDV cm/s9Antegrade +---------+--------+--+--------+-+---------+   Summary: Right Carotid: Velocities in the right ICA are consistent with a 1-39% stenosis. Left Carotid: Velocities in the left ICA are consistent with a 1-39% stenosis. Vertebrals: Bilateral vertebral arteries demonstrate antegrade flow. *See table(s) above for measurements and observations.  Electronically signed by Monica Martinez MD on 04/11/2022 at 4:12:06 PM.    Final     Microbiology: Results for orders placed or performed during the hospital encounter of 04/12/22  Resp Panel by RT-PCR (Flu A&B, Covid) Anterior Nasal Swab     Status: None   Collection Time: 04/12/22 10:45 PM   Specimen: Anterior Nasal Swab  Result Value Ref Range Status   SARS Coronavirus 2 by RT PCR NEGATIVE NEGATIVE Final    Comment: (NOTE) SARS-CoV-2 target nucleic acids are NOT DETECTED.  The SARS-CoV-2 RNA is generally detectable in upper respiratory specimens during the acute phase of infection. The lowest concentration of SARS-CoV-2 viral copies this assay can detect is 138 copies/mL. A negative result does not preclude SARS-Cov-2 infection and should not be used as the sole basis for treatment or other patient management decisions. A negative result may occur with  improper specimen collection/handling, submission of specimen other than nasopharyngeal swab, presence of viral mutation(s) within the areas targeted by this assay, and inadequate number of viral copies(<138  copies/mL). A negative result must be combined with clinical observations, patient history, and epidemiological information. The expected result is Negative.  Fact Sheet for Patients:  EntrepreneurPulse.com.au  Fact Sheet for Healthcare Providers:  IncredibleEmployment.be  This test is no t yet approved or cleared by the [place] FDA and  has been authorized for detection and/or diagnosis of SARS-CoV-2 by FDA under an Emergency Use Authorization (EUA). This EUA will remain  in effect (meaning this test can be used) for the duration of the COVID-19 declaration under Section 564(b)(1) of the Act, 21 U.S.C.section 360bbb-3(b)(1), unless the authorization is terminated  or revoked sooner.       Influenza A by PCR NEGATIVE NEGATIVE Final   Influenza B by PCR NEGATIVE NEGATIVE Final    Comment: (NOTE) The Xpert Xpress SARS-CoV-2/FLU/RSV plus assay is intended as an aid in the diagnosis of influenza from Nasopharyngeal swab specimens and should not be used as a sole basis for treatment. Nasal washings and aspirates are unacceptable for Xpert Xpress SARS-CoV-2/FLU/RSV testing.  Fact Sheet for Patients: EntrepreneurPulse.com.au  Fact Sheet for Healthcare Providers: IncredibleEmployment.be  This test is not yet approved or cleared by the [place] FDA and has been authorized for detection and/or diagnosis of SARS-CoV-2 by FDA under an Emergency Use Authorization (EUA). This EUA will remain in effect (meaning this test can be used) for the duration of the COVID-19 declaration under Section 564(b)(1) of the Act, 21 U.S.C. section 360bbb-3(b)(1), unless the authorization is terminated or revoked.  Performed at [facility], [place] [address] [place], [place] [address]     Labs: CBC: Recent Labs  Lab 04/12/22 1745 04/13/22 0036 04/13/22 0530 04/14/22 0215 04/15/22 0401  04/17/22 0957  WBC 5.8 6.4 5.9 4.9 4.9 5.9  NEUTROABS 2.9  --   --   --   --   --   HGB 13.5 13.6 12.7 12.6 13.1 13.8  HCT 41.0 42.6 39.5 38.5 39.4 42.9  MCV 84.2 85.2 84.9 83.3 82.8 84.8  PLT 270 272 285 250 256 539   Basic Metabolic Panel: Recent Labs  Lab 04/13/22 0530 04/14/22 0215 04/14/22 0226 04/15/22 0401 04/16/22 0408  04/17/22 0957  NA 140 142  --  140 140 140  K 3.5 3.6  --  4.2 3.7 4.3  CL 109 107  --  107 108 108  CO2 24 24  --  '22 23 24  '$ GLUCOSE 87 85  --  120* 121* 123*  BUN 12 9  --  '13 13 10  '$ CREATININE 0.72 0.87  --  0.91 1.03* 1.01*  CALCIUM 8.9 9.3  --  9.5 9.1 9.4  MG  --   --  1.9  --   --  2.3   Liver Function Tests: Recent Labs  Lab 04/12/22 1745  AST 21  ALT 15  ALKPHOS 41  BILITOT 0.9  PROT 7.8  ALBUMIN 4.3   CBG: Recent Labs  Lab 04/15/22 1159 04/15/22 1901 04/16/22 1145 04/16/22 1709 04/16/22 2215  GLUCAP 142* 96 120* 127* 110*    Discharge time spent: greater than 30 minutes.  Signed: Oswald Hillock, MD [facility] 04/17/2022

## 2022-04-17 NOTE — Progress Notes (Signed)
Rounding Note    Patient Name: Paula Wong Date of Encounter: 04/17/2022  [facility] Cardiologist:  Quentin Ore / Regena Delucchi   Subjective    Pt is a 76 yo with recent dx of acute on chronic combined CHF, and CVA  Echo 11/2021 shows EF 20-25% Had a recent CVA  CTA showed a mid LAD stenosis. Cath this am confirmed a LAD stenosis - mid stenosis of ~70% Her LV dysfunction seems out of proportion to her CAD She is not having angina . Will continue medical therapy for her CHF. We can refer her to shock wave therapy PCI if she starts having angina symptoms    She has acute on chronic combined CHF. Currently on metoprolol xl 75 BID Entresto 25-26 BId Spiro 12.5 qd Farxiga 10 mg a day   I/O are not accurate ( urine output is not recorded)    Inpatient Medications    Scheduled Meds:  aspirin  81 mg Oral Daily   atorvastatin  80 mg Oral Daily   clopidogrel  75 mg Oral Daily   dapagliflozin propanediol  10 mg Oral Daily   enoxaparin (LOVENOX) injection  40 mg Subcutaneous Q24H   gabapentin  100 mg Oral BID   insulin aspart  0-9 Units Subcutaneous TID AC & HS   magnesium oxide  400 mg Oral BID   metoprolol succinate  75 mg Oral BID   pantoprazole  20 mg Oral Daily   sacubitril-valsartan  1 tablet Oral BID   sodium chloride flush  3 mL Intravenous Q12H   spironolactone  12.5 mg Oral Daily   Continuous Infusions:  sodium chloride     PRN Meds: sodium chloride, acetaminophen, diazepam, melatonin, nitroGLYCERIN, prochlorperazine, sodium chloride flush   Vital Signs    Vitals:   04/16/22 2340 04/17/22 0400 04/17/22 0500 04/17/22 0736  BP: 124/71 124/70  130/75  Pulse: 78 67  76  Resp: '16 17  17  '$ Temp: 98.7 F (37.1 C) 98.7 F (37.1 C)  98.5 F (36.9 C)  TempSrc: Oral Oral  Oral  SpO2: 100% 99%  98%  Weight:   84.2 kg   Height:        Intake/Output Summary (Last 24 hours) at 04/17/2022 0949 Last data filed at 04/16/2022 1952 Gross per 24 hour  Intake  1737.98 ml  Output --  Net 1737.98 ml       04/17/2022    5:00 AM 04/16/2022    4:00 AM 04/14/2022    5:00 AM  Last 3 Weights  Weight (lbs) 185 lb 10 oz 186 lb 4.6 oz 194 lb 10.7 oz  Weight (kg) 84.2 kg 84.5 kg 88.3 kg      Telemetry    NSR - Personally Reviewed  ECG     - Personally Reviewed  Physical Exam    Physical Exam: Blood pressure 130/75, pulse 76, temperature 98.5 F (36.9 C), temperature source Oral, resp. rate 17, height '5\' 3"'$  (1.6 m), weight 84.2 kg, SpO2 98 %.       GEN:  Well nourished, well developed in no acute distress HEENT: Normal NECK: No JVD; No carotid bruits LYMPHATICS: No lymphadenopathy CARDIAC: RRR , no murmurs, rubs, gallops RESPIRATORY:  Clear to auscultation without rales, wheezing or rhonchi  ABDOMEN: Soft, non-tender, non-distended MUSCULOSKELETAL:  No edema; No deformity  SKIN: Warm and dry NEUROLOGIC:  Alert and oriented x 3     Labs    High Sensitivity Troponin:  No results for input(s): "TROPONINIHS"  in the last 720 hours.   Chemistry Recent Labs  Lab 04/12/22 1745 04/13/22 0036 04/14/22 0215 04/14/22 0226 04/15/22 0401 04/16/22 0408  NA 142   < > 142  --  140 140  K 3.8   < > 3.6  --  4.2 3.7  CL 108   < > 107  --  107 108  CO2 24   < > 24  --  22 23  GLUCOSE 137*   < > 85  --  120* 121*  BUN 16   < > 9  --  13 13  CREATININE 0.94   < > 0.87  --  0.91 1.03*  CALCIUM 9.8   < > 9.3  --  9.5 9.1  MG  --   --   --  1.9  --   --   PROT 7.8  --   --   --   --   --   ALBUMIN 4.3  --   --   --   --   --   AST 21  --   --   --   --   --   ALT 15  --   --   --   --   --   ALKPHOS 41  --   --   --   --   --   BILITOT 0.9  --   --   --   --   --   GFRNONAA >60   < > >60  --  >60 56*  ANIONGAP 10   < > 11  --  11 9   < > = values in this interval not displayed.     Lipids  Recent Labs  Lab 04/13/22 0500  CHOL 121  TRIG 82  HDL 35*  LDLCALC 70  CHOLHDL 3.5     Hematology Recent Labs  Lab 04/13/22 0530  04/14/22 0215 04/15/22 0401  WBC 5.9 4.9 4.9  RBC 4.65 4.62 4.76  HGB 12.7 12.6 13.1  HCT 39.5 38.5 39.4  MCV 84.9 83.3 82.8  MCH 27.3 27.3 27.5  MCHC 32.2 32.7 33.2  RDW 14.1 13.7 13.7  PLT 285 250 256    Thyroid No results for input(s): "TSH", "FREET4" in the last 168 hours.  BNPNo results for input(s): "BNP", "PROBNP" in the last 168 hours.  DDimer No results for input(s): "DDIMER" in the last 168 hours.   Radiology    CARDIAC CATHETERIZATION  Result Date: 04/16/2022   Colon Flattery LM lesion is 40% stenosed.   Mid LM to Dist LM lesion is 30% stenosed.   Ost LAD to Prox LAD lesion is 40% stenosed.   Mid LAD-1 lesion is 80% stenosed.   Mid LAD-2 lesion is 30% stenosed.   RV Branch lesion is 70% stenosed.   Mid Cx lesion is 5% stenosed. Significant multi vessel coronary calcification. The left main has 40% ostial 30% distal stenosis; the LAD has 40% ostial to proximal stenosis followed by focal calcified 80% stenosis prior to the takeoff of a septal perforator and the second diagonal vessel with 30% mid stenosis; mild nonobstructive disease in the left circumflex vessel, and diffuse 70% stenosis in the marginal branch of the RCA. LVEDP is elevated at 22 mmHg. RECOMMENDATION: Patient has significant LV dysfunction out of proportion to her or significant LAD stenosis.  She has been without anginal symptomatology.  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  Consider addition of anti-ischemic medical therapy with 80% LAD stenosis.  Will ask colleagues to review.  If patient has increasing symptomatology consider PCI to LAD with possible shockwave or CSI atherectomy if needed.   CT CORONARY MORPH W/CTA COR W/SCORE W/CA W/CM &/OR WO/CM  Addendum Date: 04/16/2022   ADDENDUM REPORT: 04/16/2022 09:40 EXAM: OVER-READ INTERPRETATION  CT CHEST The following report is a limited chest CT over-read performed by radiologist Dr. Lindaann Slough [facility]  [place] on 04/16/2022. This over-read does not include interpretation of cardiac or coronary anatomy or pathology. The coronary CTA interpretation by the cardiologist is attached. COMPARISON:  None. FINDINGS: Mediastinum/Nodes: No enlarged lymph nodes within the visualized mediastinum. Lungs/Pleura: There is no pleural effusion. The visualized lungs appear clear. Upper abdomen: No significant findings in the visualized upper abdomen. Musculoskeletal/Chest wall: No chest wall mass or suspicious osseous findings within the visualized chest. IMPRESSION: No significant extracardiac findings within the visualized chest. Electronically Signed   By: Richardean Sale M.D.   On: 04/16/2022 09:40   Result Date: 04/16/2022 CLINICAL DATA:  76 Year old African American Female EXAM: Cardiac/Coronary  CTA TECHNIQUE: The patient was scanned on a [employer]. FINDINGS: Scan was triggered in the descending thoracic aorta. Axial non-contrast 3 mm slices were carried out through the heart. The data set was analyzed on a dedicated work station and scored using the [place]. Gantry rotation speed was 250 msecs and collimation was .6 mm. 0.8 mg of sl NTG was given. The 3D data set was reconstructed in 5% intervals of the 67-82 % of the R-R cycle. Diastolic phases were analyzed on a dedicated work station using MPR, MIP and VRT modes. The patient received 95 cc of contrast. Coronary Arteries:  Normal coronary origin.  Co-dominance. Coronary Calcium Score: Left main: 896 Left anterior descending artery: 1050 Left circumflex artery: 304 Right coronary artery: 152 Ramus intermedius artery: 150 Total: 2553 Percentile: 99th for age, sex, and race matched control. RCA is an artery that gives rise to PDA and PLA. Minimal calcified plaque (1-24%) in the ostial RCA. Mild soft plaque (25-49%) mid RCA. Minimal calcified plaque (1-24%) in the distal RCA. Left main is a large artery that gives rise to LAD, RI, and LCX arteries. Mild  calcified plaque (25-49%) proximal left main. LAD is a large vessel that gives rise to two diagonal vessels. Moderate stenosis, calcified plaque, (50-70%) ostial LAD. Moderate stenosis, calcified plaque, (50-70%) proximal LAD. There are two severe mixed plaque stenosis (70-99%) in the mid LAD. Severe mixed plaque stenosis (70-99%) distal LAD LCX is a co-dominant artery that gives rise to multiple OM vessels and a small L-PDA. Mild calcified plaque (25-49%) proximal RCA. Ramus intermedius artery with mild, mixed plaque in the proximal vessel then a severe mixed plaque stenosis (70-99%) in the mid body. Vessel is diffusely diseased. Other findings: Aorta: Normal size.  Aortic atherosclerosis.  No dissection. Main Pulmonary Artery: Severe dilation 35 mm. Aortic Valve:  Tri-leaflet.  No calcifications. Normal pulmonary vein drainage into the left atrium. Normal left atrial appendage without a thrombus. Small PFO. Severe left ventricular dilation. Small pericardial effusion anterior to the right ventricle an lateral to the left ventricle Extra-cardiac findings: See attached radiology report for non-cardiac structures. IMPRESSION: 1. Coronary calcium score of 2553. This was 99th percentile for age, sex, and race matched control. 2. Normal coronary origin with co-dominance. 3. CAD-RADS 4 Severe stenosis. (70-99% or > 50% left main). CT FFR is will be  sent. Consider symptom-guided anti-ischemic pharmacotherapy as well as risk factor modification per guideline directed care. 4.  Aortic atherosclerosis. 5. Severe left ventricular dilation. RECOMMENDATIONS: Coronary artery calcium (CAC) score is a strong predictor of incident coronary heart disease (CHD) and provides predictive information beyond traditional risk factors. CAC scoring is reasonable to use in the decision to withhold, postpone, or initiate statin therapy in intermediate-risk or selected borderline-risk asymptomatic adults (age 86-75 years and LDL-C >=70 to <190  mg/dL) who do not have diabetes or established atherosclerotic cardiovascular disease (ASCVD).* In intermediate-risk (10-year ASCVD risk >=7.5% to <20%) adults or selected borderline-risk (10-year ASCVD risk >=5% to <7.5%) adults in whom a CAC score is measured for the purpose of making a treatment decision the following recommendations have been made: If CAC = 0, it is reasonable to withhold statin therapy and reassess in 5 to 10 years, as long as higher risk conditions are absent (diabetes mellitus, family history of premature CHD in first degree relatives (males <55 years; females <65 years), cigarette smoking, LDL >=190 mg/dL or other independent risk factors). If CAC is 1 to 99, it is reasonable to initiate statin therapy for patients >=16 years of age. If CAC is >=100 or >=75th percentile, it is reasonable to initiate statin therapy at any age. Cardiology referral should be considered for patients with CAC scores =400 or >=75th percentile. *2018 AHA/ACC/AACVPR/AAPA/ABC/ACPM/ADA/AGS/APhA/ASPC/NLA/PCNA Guideline on the Management of Blood Cholesterol: A Report of the American College of Cardiology/American Heart Association Task Force on Clinical Practice Guidelines. J Am Coll Cardiol. 2019;73(24):3168-3209. Rudean Haskell, MD Electronically Signed: By: Rudean Haskell M.D. On: 04/15/2022 16:46   EP PPM/ICD IMPLANT  Result Date: 04/15/2022 CONCLUSIONS:  1. Successful implantation of a implantable loop recorder for Cryptogenic stroke  2. No early apparent complications.   CT CORONARY FRACTIONAL FLOW RESERVE DATA PREP  Result Date: 04/15/2022 EXAM: CT-FFR ANALYSIS CLINICAL DATA:  Possibly obstructive coronary lesion: LAD and RI FINDINGS: CT-FFR analysis was performed on the original cardiac CT angiogram dataset. Diagrammatic representation of the CT-FFR analysis is provided in a separate PDF document in PACS. This dictation was created using the PDF document and an interactive 3D model of the  results. 3D model is not available in the EMR/PACS. Normal FFR range is >0.80. 1. Left Main: No significant functional stenosis, CT-FFR 0.97. 2. LAD: significant functional stenosis, CT-FFR 0.93 in proximal LAD, 0.79 at mid LAD, and 0.70 at distal LAD lesion. 3. LCX: No significant functional stenosis, CT-FFR 0.96 at proximal LCX, 0.82 distal vessel. 4. RCA: No significant functional stenosis, CT-FFR 0.81 distal vessel. 5. RI: No significant functional stenosis, CT-FFR 0.96 at lesion. Provider note: In companion Cardiac CT, LCX is a co-dominant artery that gives rise to multiple OM vessels and a small L-PDA. Mild calcified plaque (25-49%) proximal LCX. Dictation inappropriately noted RCA (there is a mild calcified plaque also in the RCA). IMPRESSION: 1. CT FFR analysis shows evidence of significant functional stenosis in the LAD but not in the RI. Rudean Haskell MD Electronically Signed   By: Rudean Haskell M.D.   On: 04/15/2022 18:58   CT CORONARY FRACTIONAL FLOW RESERVE FLUID ANALYSIS  Result Date: 04/15/2022 EXAM: CT-FFR ANALYSIS CLINICAL DATA:  Possibly obstructive coronary lesion: LAD and RI FINDINGS: CT-FFR analysis was performed on the original cardiac CT angiogram dataset. Diagrammatic representation of the CT-FFR analysis is provided in a separate PDF document in PACS. This dictation was created using the PDF document and an interactive 3D model of the  results. 3D model is not available in the EMR/PACS. Normal FFR range is >0.80. 1. Left Main: No significant functional stenosis, CT-FFR 0.97. 2. LAD: significant functional stenosis, CT-FFR 0.93 in proximal LAD, 0.79 at mid LAD, and 0.70 at distal LAD lesion. 3. LCX: No significant functional stenosis, CT-FFR 0.96 at proximal LCX, 0.82 distal vessel. 4. RCA: No significant functional stenosis, CT-FFR 0.81 distal vessel. 5. RI: No significant functional stenosis, CT-FFR 0.96 at lesion. Provider note: In companion Cardiac CT, LCX is a  co-dominant artery that gives rise to multiple OM vessels and a small L-PDA. Mild calcified plaque (25-49%) proximal LCX. Dictation inappropriately noted RCA (there is a mild calcified plaque also in the RCA). IMPRESSION: 1. CT FFR analysis shows evidence of significant functional stenosis in the LAD but not in the RI. Rudean Haskell MD Electronically Signed   By: Rudean Haskell M.D.   On: 04/15/2022 18:58    Cardiac Studies      Patient Profile     76 y.o. female  with chronic combined CHF, new CVA   Assessment & Plan     Chronic combined CHF:      Currently on toprol XL 75 mg BID Entresto 24-26 BID Spironolactone 12.5 QD  Farxiga 10 mg a day   Cont meds.   Will continue to titrate up her meds as OP   I would like for her to ambulate in the halls prior to DC  She feels well Could consider DC tomorrow if she does well   2.   CAD has a moderate - severe mid LAD stenosis .  No angina . Will add NTG SL PRN to her list .  She is on plavix already  3. S/p cath:  gave lifting precautions   I will follow up with her for her CAD and CHF. She will see Dr. Quentin Ore for follow up of her implantable loop recorder.          For questions or updates, please contact [place] Please consult [email or web address] for contact info under        Signed, Mertie Moores, MD  04/17/2022, 9:49 AM

## 2022-04-18 LAB — LIPOPROTEIN A (LPA): Lipoprotein (a): 109.5 nmol/L — ABNORMAL HIGH (ref <75.0)

## 2022-05-01 ENCOUNTER — Ambulatory Visit: Payer: 59 | Admitting: Cardiology

## 2022-05-01 NOTE — Progress Notes (Signed)
Cardiology Office Note:    Date:  05/02/2022   ID:  Paula Wong, DOB 11/13/1945, MRN [id number]  PCP:  Nguyen, Kim, NP   [facility] Providers Cardiologist:  Nahser  EP:  Lambert   Referring MD: Nguyen, Kim, NP   Chief Complaint  Patient presents with   Congestive Heart Failure        Coronary Artery Disease         History of Present Illness:    Paula Wong is a 76 y.o. female with a hx of CVA , CHF I met her during a recent hospitalization for her stroke   Coronary artery calcium score is 2553 RCA - mild - mod  CAD LM - moderate CAD LAD - mod disease in 2 large diags,  severe disease in mid vessel and distal vessel  Ramus :  proximal severe disease  LCx: moderate CAD  Cath on Aug. 29, 2023:   mod - severe CAD . Her LV dysfunction seemed out of proportion to her degree of CAD .  If she develops symptoms of CP, can consider shockwave therapy or CSI atherectomy         Past Medical History:  Diagnosis Date   Allergy    Diabetes (HCC)    Diverticulosis    GERD (gastroesophageal reflux disease)    Hiatal hernia    Hyperlipidemia    Hypertension    Neuropathy    feet   Schatzki's ring     Past Surgical History:  Procedure Laterality Date   LEFT HEART CATH AND CORONARY ANGIOGRAPHY N/A 04/16/2022   Procedure: LEFT HEART CATH AND CORONARY ANGIOGRAPHY;  Surgeon: Kelly, Thomas A, MD;  Location: [facility];  Service: Cardiovascular;  Laterality: N/A;   LOOP RECORDER INSERTION N/A 04/15/2022   Procedure: LOOP RECORDER INSERTION;  Surgeon: Lambert, Cameron T, MD;  Location: [facility];  Service: Cardiovascular;  Laterality: N/A;   MASS EXCISION Right 06/07/2019   Procedure: EXCISIO OF KELOID TO RIGHT NECK;  Surgeon: Truesdale, Gerald, MD;  Location: [facility];  Service: Plastics;  Laterality: Right;   TOOTH EXTRACTION  2013    Current Medications: Current Meds  Medication Sig   aspirin 81 MG chewable tablet  Chew 1 tablet (81 mg total) by mouth daily for 21 days. Stop Aspirin after 3 weeks and continue with Plavix indefinitely   atorvastatin (LIPITOR) 80 MG tablet Take 80 mg by mouth daily.   clopidogrel (PLAVIX) 75 MG tablet Take 1 tablet (75 mg total) by mouth daily.   ENTRESTO 24-26 MG Take 1 tablet by mouth 2 (two) times daily.   FARXIGA 10 MG TABS tablet Take 10 mg by mouth daily.   gabapentin (NEURONTIN) 300 MG capsule Take by mouth.   magnesium oxide (MAG-OX) 400 MG tablet Take 400 mg by mouth daily.   metFORMIN (GLUCOPHAGE-XR) 500 MG 24 hr tablet Take 500 mg by mouth 2 (two) times daily.   OZEMPIC, 0.25 OR 0.5 MG/DOSE, 2 MG/1.5ML SOPN Inject 0.5 mg into the skin once a week.   pantoprazole (PROTONIX) 20 MG tablet Take 1 tablet (20 mg total) by mouth daily.   spironolactone (ALDACTONE) 25 MG tablet Take 12.5 mg by mouth daily.   TRESIBA FLEXTOUCH 200 UNIT/ML SOPN Take 80 Units by mouth at bedtime.    [DISCONTINUED] metoprolol succinate (TOPROL-XL) 25 MG 24 hr tablet Take 3 tablets (75 mg total) by mouth 2 (two) times daily.     Allergies:     Patient has no known allergies.   Social History   Socioeconomic History   Marital status: Single    Spouse name: Not on file   Number of children: Not on file   Years of education: Not on file   Highest education level: Not on file  Occupational History   Occupation: Retired  Tobacco Use   Smoking status: Former    Types: Cigarettes    Quit date: 08/20/2003    Years since quitting: 18.7   Smokeless tobacco: Never  Vaping Use   Vaping Use: Never used  Substance and Sexual Activity   Alcohol use: No   Drug use: No   Sexual activity: Not on file  Other Topics Concern   Not on file  Social History Narrative   Not on file   Social Determinants of Health   Financial Resource Strain: Not on file  Food Insecurity: Not on file  Transportation Needs: Unmet Transportation Needs (05/18/2019)   PRAPARE - Transportation    Lack of  Transportation (Medical): Yes    Lack of Transportation (Non-Medical): Yes  Physical Activity: Not on file  Stress: Not on file  Social Connections: Not on file     Family History: The patient's family history includes Cancer in her sister; Colon cancer (age of onset: 77) in her mother; Heart attack in her brother and father. There is no history of Stomach cancer, Pancreatic cancer, or Esophageal cancer.  ROS:   Please see the history of present illness.     All other systems reviewed and are negative.  EKGs/Labs/Other Studies Reviewed:    The following studies were reviewed today:   EKG:   Recent Labs: 04/12/2022: ALT 15 04/17/2022: BUN 10; Creatinine, Ser 1.01; Hemoglobin 13.8; Magnesium 2.3; Platelets 289; Potassium 4.3; Sodium 140  Recent Lipid Panel    Component Value Date/Time   CHOL 121 04/13/2022 0500   TRIG 82 04/13/2022 0500   HDL 35 (L) 04/13/2022 0500   CHOLHDL 3.5 04/13/2022 0500   VLDL 16 04/13/2022 0500   LDLCALC 70 04/13/2022 0500   LDLCALC 90 10/02/2020 1100     Risk Assessment/Calculations:                Physical Exam:    VS:  BP 118/78   Pulse 98   Ht 5' 5" (1.651 m)   Wt 180 lb 9.6 oz (81.9 kg)   SpO2 96%   BMI 30.05 kg/m     Wt Readings from Last 3 Encounters:  05/02/22 180 lb 9.6 oz (81.9 kg)  04/17/22 185 lb 10 oz (84.2 kg)  08/24/21 190 lb (86.2 kg)     GEN:  Well nourished, well developed in no acute distress HEENT: Normal NECK: No JVD; No carotid bruits LYMPHATICS: No lymphadenopathy CARDIAC: RRR, no murmurs, rubs, gallops RESPIRATORY:  Clear to auscultation without rales, wheezing or rhonchi  ABDOMEN: Soft, non-tender, non-distended MUSCULOSKELETAL:  No edema; No deformity  SKIN: Warm and dry NEUROLOGIC:  Alert and oriented x 3 PSYCHIATRIC:  Normal affect   ASSESSMENT:    1. Chronic combined systolic and diastolic heart failure (HCC)   2. Coronary artery disease involving native coronary artery of native heart without  angina pectoris    PLAN:      CVA:   has an implantable loop recorder   2.  Chronic combined CHF:  cont meds.  Cath showed some disease in the small branch vessels but the degree of CAD was not thought to be enough to   be the etiology of her CHF .   Cont current meds.  Titrate up as tolerated  3.   CAD :  has moderate CAD of several branch vessels.  The stenosis are calcified.  She is not having any angina . We could attempt PCi ( likely with shockwave therapy or rotational atherectomy)              Medication Adjustments/Labs and Tests Ordered: Current medicines are reviewed at length with the patient today.  Concerns regarding medicines are outlined above.  No orders of the defined types were placed in this encounter.  No orders of the defined types were placed in this encounter.   Patient Instructions  Medication Instructions:  Your physician recommends that you continue on your current medications as directed. Please refer to the Current Medication list given to you today.  *If you need a refill on your cardiac medications before your next appointment, please call your pharmacy*   Lab Work: NONE If you have labs (blood work) drawn today and your tests are completely normal, you will receive your results only by: MyChart Message (if you have MyChart) OR A paper copy in the mail If you have any lab test that is abnormal or we need to change your treatment, we will call you to review the results.   Testing/Procedures: NONE   Follow-Up: At [facility], you and your health needs are our priority.  As part of our continuing mission to provide you with exceptional heart care, we have created designated Provider Care Teams.  These Care Teams include your primary Cardiologist (physician) and Advanced Practice Providers (APPs -  Physician Assistants and Nurse Practitioners) who all work together to provide you with the care you need, when you need it.  We  recommend signing up for the patient portal called "MyChart".  Sign up information is provided on this After Visit Summary.  MyChart is used to connect with patients for Virtual Visits (Telemedicine).  Patients are able to view lab/test results, encounter notes, upcoming appointments, etc.  Non-urgent messages can be sent to your provider as well.   To learn more about what you can do with MyChart, go to [email or web address].    Your next appointment:   3 month(s)  The format for your next appointment:   In Person  Provider:   Philip Nahser, MD  Other Instructions    Important Information About Sugar         Signed, Philip Nahser, MD  05/02/2022 7:20 PM    [facility]  

## 2022-05-02 ENCOUNTER — Encounter: Payer: Self-pay | Admitting: Cardiovascular Disease

## 2022-05-02 ENCOUNTER — Ambulatory Visit: Payer: Medicare Other | Attending: Cardiology | Admitting: Cardiovascular Disease

## 2022-05-02 ENCOUNTER — Telehealth: Payer: Self-pay

## 2022-05-02 VITALS — BP 118/78 | HR 98 | Ht 65.0 in | Wt 180.6 lb

## 2022-05-02 DIAGNOSIS — I2510 Atherosclerotic heart disease of native coronary artery without angina pectoris: Secondary | ICD-10-CM | POA: Diagnosis not present

## 2022-05-02 DIAGNOSIS — I5042 Chronic combined systolic (congestive) and diastolic (congestive) heart failure: Secondary | ICD-10-CM | POA: Diagnosis not present

## 2022-05-02 MED ORDER — METOPROLOL SUCCINATE ER 25 MG PO TB24: ORAL_TABLET | ORAL | 3 refills | Status: DC

## 2022-05-02 NOTE — Patient Instructions (Signed)
Medication Instructions:  Your physician recommends that you continue on your current medications as directed. Please refer to the Current Medication list given to you today.  *If you need a refill on your cardiac medications before your next appointment, please call your pharmacy*   Lab Work: NONE If you have labs (blood work) drawn today and your tests are completely normal, you will receive your results only by: [place] (if you have MyChart) OR A paper copy in the mail If you have any lab test that is abnormal or we need to change your treatment, we will call you to review the results.   Testing/Procedures: NONE   Follow-Up: At [facility], you and your health needs are our priority.  As part of our continuing mission to provide you with exceptional heart care, we have created designated Provider Care Teams.  These Care Teams include your primary Cardiologist (physician) and Advanced Practice Providers (APPs -  Physician Assistants and Nurse Practitioners) who all work together to provide you with the care you need, when you need it.  We recommend signing up for the patient portal called "MyChart".  Sign up information is provided on this After Visit Summary.  MyChart is used to connect with patients for Virtual Visits (Telemedicine).  Patients are able to view lab/test results, encounter notes, upcoming appointments, etc.  Non-urgent messages can be sent to your provider as well.   To learn more about what you can do with MyChart, go to [email or web address].    Your next appointment:   3 month(s)  The format for your next appointment:   In Person  Provider:   Mertie Moores, MD  Other Instructions    Important Information About Sugar

## 2022-05-02 NOTE — Telephone Encounter (Signed)
Pt returned to call to office per request to verify metoprolol dose and how she was taking. She states that she was taking Toprol Xl 25 mg once in the morning and 2 tablets in the evening (not 3 tabs BID as chart shows). Per Nahser, have her increase to 2 tablets BID moving forward. Pt states that she understands. MAR updated and new prescription sent to pharmacy on file.

## 2022-05-13 ENCOUNTER — Telehealth: Payer: Self-pay | Admitting: *Deleted

## 2022-05-13 NOTE — Telephone Encounter (Signed)
   Pre-operative Risk Assessment    Patient Name: Paula Wong  DOB: 09-Dec-1945 MRN: [id number]      Request for Surgical Clearance    Procedure:   EXCISION OF SEBACEOUS CYST  Date of Surgery:  Clearance TBD                                 Surgeon:  DR. Ralene Ok Surgeon's Group or Practice Name:  [facility] Phone number:  [phone] Fax number:  [phone] ATTN:  [name], CMA   Type of Clearance Requested:   - Medical  - Pharmacy:  Hold Clopidogrel (Plavix)     Type of Anesthesia:  General    Additional requests/questions:    [name]   05/13/2022, 8:31 AM

## 2022-05-14 NOTE — Telephone Encounter (Signed)
   Patient Name: Paula Wong  DOB: [birth year] MRN: [id number]  Primary Cardiologist: None  Chart reviewed as part of pre-operative protocol coverage. Given past medical history and time since last visit, based on ACC/AHA guidelines, Paula Wong would be at acceptable risk for the planned procedure without further cardiovascular testing. She was last seen 05/02/22.   Given her CVA 04/12/22, would recommend input from neurology regarding holding Plavix. Would be permissible to hold 5 days from cardiac perspective.   The patient was advised that if she develops new symptoms prior to surgery to contact our office to arrange for a follow-up visit, and she verbalized understanding.  I will route this recommendation to the requesting party via Epic fax function and remove from pre-op pool.  Please call with questions.  Loel Dubonnet, NP 05/14/2022, 3:45 PM

## 2022-07-04 ENCOUNTER — Ambulatory Visit: Payer: Medicare Other | Admitting: Podiatry

## 2022-08-28 ENCOUNTER — Ambulatory Visit: Payer: Medicare Other | Admitting: Cardiovascular Disease

## 2022-09-05 ENCOUNTER — Encounter: Payer: Self-pay | Admitting: Cardiovascular Disease

## 2022-09-05 NOTE — Progress Notes (Signed)
Cardiology Office Note:    Date:  09/06/2022   ID:  Paula Wong, DOB Apr 29, 1946, MRN [id number]  PCP:  Arthur Holms, NP   [facility] Prov[facility] Cardiologist:  Iraida Cragin  EP:  Quentin Ore   Referring MD: Arthur Holms, NP   Chief Complaint  Patient presents with   Congestive Heart Failure         History of Present Illness:    Paula Wong is a 76 y.o. female with a hx of CVA , CHF I met her during a recent hospitalization for her stroke   Coronary artery calcium score is 2553 RCA - mild - mod  CAD LM - moderate CAD LAD - mod disease in 2 large diags,  severe disease in mid vessel and distal vessel  [place]amus :  proximal severe disease  LCx: moderate CAD  Cath on Aug. 29, 2023:   mod - severe CAD . Her LV dysfunction seemed out of proportion to her degree of CAD .  If she develops symptoms of CP, can consider shockwave therapy or CSI atherectomy    Jan. 19, 2024  Paula Wong is seen today for follow up of her hx of CVA, CHF Has moderate CAD.  Her LV dysfunction seems to be out of proportion to her degree of her CAD .  Echocardiogram in August, 2023 reveals EF of 20 to 25%.  Current medications include  Entresto 24-26 twice a day Metoprolol succinate 50 mg twice a day Farxiga 10 mg a day Aldactone 12.5 mg a day  HR and BP are well controlled.  Not getting any exercise        Past Medical History:  Diagnosis Date   Allergy    Diabetes ([place])    Diverticulosis    GERD (gastroesophageal reflux disease)    Hiatal hernia    Hyperlipidemia    Hypertension    Neuropathy    feet   Schatzki's ring     Past Surgical History:  Procedure Laterality Date   LEFT HEART CATH AND CORONARY ANGIOGRAPHY N/A 04/16/2022   Procedure: LEFT HEART CATH AND CORONARY ANGIOGRAPHY;  Surgeon: Troy Sine, MD;  Location: [facility];  Service: Cardiovascular;  Laterality: N/A;   LOOP RECORDER INSERTION N/A 04/15/2022   Procedure: LOOP RECORDER INSERTION;  Surgeon:  Vickie Epley, MD;  Location: [facility];  Service: Cardiovascular;  Laterality: N/A;   MASS EXCISION Right 06/07/2019   Procedure: Lucia Estelle OF KELOID TO RIGHT NECK;  Surgeon: Cristine Polio, MD;  Location: [facility];  Service: Plastics;  Laterality: Right;   TOOTH EXTRACTION  2013    Current Medications: Current Meds  Medication Sig   atorvastatin (LIPITOR) 80 MG tablet Take 80 mg by mouth daily.   clopidogrel (PLAVIX) 75 MG tablet Take 1 tablet (75 mg total) by mouth daily.   FARXIGA 10 MG TABS tablet Take 10 mg by mouth daily.   gabapentin (NEURONTIN) 600 MG tablet Take 600 mg by mouth 2 (two) times daily.   Linagliptin (TRADJENTA PO) Take 1 capsule by mouth daily.   magnesium oxide (MAG-OX) 400 MG tablet Take 400 mg by mouth daily.   metFORMIN (GLUCOPHAGE-XR) 500 MG 24 hr tablet Take 500 mg by mouth 2 (two) times daily.   metoprolol succinate (TOPROL-XL) 50 MG 24 hr tablet Take 50 mg by mouth 2 (two) times daily.   Multiple Vitamin (MULTIVITAMIN) tablet Take 1 tablet by mouth daily.   OZEMPIC, 0.25 OR 0.5 MG/DOSE, 2 MG/1.5ML  SOPN Inject 0.5 mg into the skin once a week.   pantoprazole (PROTONIX) 20 MG tablet Take 1 tablet (20 mg total) by mouth daily.   sacubitril-valsartan (ENTRESTO) 49-51 MG Take 1 tablet by mouth 2 (two) times daily.   spironolactone (ALDACTONE) 25 MG tablet Take 12.5 mg by mouth daily.   TRESIBA FLEXTOUCH 200 UNIT/ML SOPN Take 80 Units by mouth at bedtime.    [DISCONTINUED] ENTRESTO 24-26 MG Take 1 tablet by mouth 2 (two) times daily.     Allergies:   Patient has no known allergies.   Social History   Socioeconomic History   Marital status: Single    Spouse name: Not on file   Number of children: Not on file   Years of education: Not on file   Highest education level: Not on file  Occupational History   Occupation: Retired  Tobacco Use   Smoking status: Former    Types: Cigarettes    Quit date: 08/20/2003    Years since  quitting: 19.0   Smokeless tobacco: Never  Vaping Use   Vaping Use: Never used  Substance and Sexual Activity   Alcohol use: No   Drug use: No   Sexual activity: Not on file  Other Topics Concern   Not on file  Social History Narrative   Not on file   Social Determinants of Health   Financial Resource Strain: Not on file  Food Insecurity: Not on file  Transportation Needs: Unmet Transportation Needs (05/18/2019)   PRAPARE - Hydrologist (Medical): Yes    Lack of Transportation (Non-Medical): Yes  Physical Activity: Not on file  Stress: Not on file  Social Connections: Not on file     Family History: The patient's family history includes Cancer in her sister; Colon cancer (age of onset: 83) in her mother; Heart attack in her brother and father. There is no history of Stomach cancer, Pancreatic cancer, or Esophageal cancer.  ROS:   Please see the history of present illness.     All other systems reviewed and are negative.  EKGs/Labs/Other Studies Reviewed:    The following studies were reviewed today:   EKG:   Recent Labs: 04/12/2022: ALT 15 04/17/2022: BUN 10; Creatinine, Ser 1.01; Hemoglobin 13.8; Magnesium 2.3; Platelets 289; Potassium 4.3; Sodium 140  Recent Lipid Panel    Component Value Date/Time   CHOL 121 04/13/2022 0500   TRIG 82 04/13/2022 0500   HDL 35 (L) 04/13/2022 0500   CHOLHDL 3.5 04/13/2022 0500   VLDL 16 04/13/2022 0500   LDLCALC 70 04/13/2022 0500   LDLCALC 90 10/02/2020 1100     Risk Assessment/Calculations:                Physical Exam:    Physical Exam: Blood pressure 122/76, pulse 94, height '5\' 5"'$  (1.651 m), weight 183 lb 12.8 oz (83.4 kg), SpO2 97 %.       GEN:  Well nourished, well developed in no acute distress HEENT: Normal NECK: No JVD; No carotid bruits LYMPHATICS: No lymphadenopathy CARDIAC: RRR , no murmurs, rubs, gallops RESPIRATORY:  Clear to auscultation without rales, wheezing or  rhonchi  ABDOMEN: Soft, non-tender, non-distended MUSCULOSKELETAL:  No edema; No deformity  SKIN: Warm and dry NEUROLOGIC:  Alert and oriented x 3   ASSESSMENT:    1. Chronic combined systolic and diastolic heart failure ([place])   2. Coronary artery disease involving native coronary artery of native heart without angina pectoris  3. Hypertension, unspecified type   4. Medication management     PLAN:      CVA:   has an implantable loop recorder    2.  Chronic combined CHF:   Cath showed some disease in the small branch vessels but the degree of CAD was not thought to be enough to be the etiology of her CHF .   Her last EF was 20 to 25% by echo in August, 2023.  Will increase the Entresto to 49-51 twice a day.  BMP 3 weeks after dose adjustment .  Will plan on getting an echocardiogram in 2 to 3 months.  I will see her in the office approximately 1 week after that.  3.   CAD :  has moderate CAD of several branch vessels.  The stenosis are calcified.  We could attempt PCi ( likely with shockwave therapy or rotational atherectomy)   Fortunately, she is not having any angina at this time.               Medication Adjustments/Labs and Tests Ordered: Current medicines are reviewed at length with the patient today.  Concerns regarding medicines are outlined above.  Orders Placed This Encounter  Procedures   Basic metabolic panel   ECHOCARDIOGRAM COMPLETE   Meds ordered this encounter  Medications   sacubitril-valsartan (ENTRESTO) 49-51 MG    Sig: Take 1 tablet by mouth 2 (two) times daily.    Dispense:  60 tablet    Refill:  11     Patient Instructions  Medication Instructions:  INCREASE Entresto to 49/'51mg'$  twice daily *If you need a refill on your cardiac medications before your next appointment, please call your pharmacy*   Lab Work: BMET in 3 weeks If you have labs (blood work) drawn today and your tests are completely normal, you will receive your results  only by: [place] (if you have MyChart) OR A paper copy in the mail If you have any lab test that is abnormal or we need to change your treatment, we will call you to review the results.   Testing/Procedures: ECHO (in 2-3 months) Your physician has requested that you have an echocardiogram. Echocardiography is a painless test that uses sound waves to create images of your heart. It provides your doctor with information about the size and shape of your heart and how well your heart's chambers and valves are working. This procedure takes approximately one hour. There are no restrictions for this procedure. Please do NOT wear cologne, perfume, aftershave, or lotions (deodorant is allowed). Please arrive 15 minutes prior to your appointment time.  Follow-Up: At [facility], you and your health needs are our priority.  As part of our continuing mission to provide you with exceptional heart care, we have created designated Provider Care Teams.  These Care Teams include your primary Cardiologist (physician) and Advanced Practice Providers (APPs -  Physician Assistants and Nurse Practitioners) who all work together to provide you with the care you need, when you need it.  We recommend signing up for the patient portal called "MyChart".  Sign up information is provided on this After Visit Summary.  MyChart is used to connect with patients for Virtual Visits (Telemedicine).  Patients are able to view lab/test results, encounter notes, upcoming appointments, etc.  Non-urgent messages can be sent to your provider as well.   To learn more about what you can do with MyChart, go to [email or web address].    Your next appointment:  3 month(s)  Provider:   Mertie Moores, MD     Signed, Mertie Moores, MD  09/06/2022 5:45 PM    [place]

## 2022-09-06 ENCOUNTER — Encounter: Payer: Self-pay | Admitting: Cardiovascular Disease

## 2022-09-06 ENCOUNTER — Ambulatory Visit: Payer: 59 | Attending: Cardiovascular Disease | Admitting: Cardiovascular Disease

## 2022-09-06 VITALS — BP 122/76 | HR 94 | Ht 65.0 in | Wt 183.8 lb

## 2022-09-06 DIAGNOSIS — Z79899 Other long term (current) drug therapy: Secondary | ICD-10-CM | POA: Diagnosis not present

## 2022-09-06 DIAGNOSIS — I5042 Chronic combined systolic (congestive) and diastolic (congestive) heart failure: Secondary | ICD-10-CM | POA: Diagnosis not present

## 2022-09-06 DIAGNOSIS — I2510 Atherosclerotic heart disease of native coronary artery without angina pectoris: Secondary | ICD-10-CM

## 2022-09-06 DIAGNOSIS — I10 Essential (primary) hypertension: Secondary | ICD-10-CM | POA: Diagnosis not present

## 2022-09-06 MED ORDER — ENTRESTO 49-51 MG PO TABS: 1.0000 | ORAL_TABLET | Freq: Two times a day (BID) | ORAL | 11 refills | Status: DC

## 2022-09-06 NOTE — Patient Instructions (Signed)
Medication Instructions:  INCREASE Entresto to 49/'51mg'$  twice daily *If you need a refill on your cardiac medications before your next appointment, please call your pharmacy*   Lab Work: BMET in 3 weeks If you have labs (blood work) drawn today and your tests are completely normal, you will receive your results only by: [place] (if you have MyChart) OR A paper copy in the mail If you have any lab test that is abnormal or we need to change your treatment, we will call you to review the results.   Testing/Procedures: ECHO (in 2-3 months) Your physician has requested that you have an echocardiogram. Echocardiography is a painless test that uses sound waves to create images of your heart. It provides your doctor with information about the size and shape of your heart and how well your heart's chambers and valves are working. This procedure takes approximately one hour. There are no restrictions for this procedure. Please do NOT wear cologne, perfume, aftershave, or lotions (deodorant is allowed). Please arrive 15 minutes prior to your appointment time.  Follow-Up: At [facility] you and your health needs are our priority.  As part of our continuing mission to provide you with exceptional heart care, we have created designated Provider Care Teams.  These Care Teams include your primary Cardiologist (physician) and Advanced Practice Providers (APPs -  Physician Assistants and Nurse Practitioners) who all work together to provide you with the care you need, when you need it.  We recommend signing up for the patient portal called "MyChart".  Sign up information is provided on this After Visit Summary.  MyChart is used to connect with patients for Virtual Visits (Telemedicine).  Patients are able to view lab/test results, encounter notes, upcoming appointments, etc.  Non-urgent messages can be sent to your provider as well.   To learn more about what you can do with MyChart, go to  [email or web address].    Your next appointment:   3 month(s)  Provider:   Mertie Moores, MD

## 2022-09-10 ENCOUNTER — Other Ambulatory Visit: Payer: Self-pay

## 2022-09-10 ENCOUNTER — Telehealth: Payer: Self-pay | Admitting: Internal Medicine

## 2022-09-10 DIAGNOSIS — K2100 Gastro-esophageal reflux disease with esophagitis, without bleeding: Secondary | ICD-10-CM

## 2022-09-10 MED ORDER — PANTOPRAZOLE SODIUM 20 MG PO TBEC: 20.0000 mg | DELAYED_RELEASE_TABLET | Freq: Every day | ORAL | 3 refills | Status: DC

## 2022-09-10 NOTE — Telephone Encounter (Signed)
Contacted patient and made sue pharmacy was correct in system. Refill of Pantoprazole was sent to [facility] on [address]. Patient also had a question on when she needed to see Dr.Pyrtle again. I let patient know that she is to follow up in 1-2 years per Dr.Pyrtle's note.

## 2022-09-10 NOTE — Telephone Encounter (Signed)
Patient is calling states she needs a refill on her Protonix and also would like for someone to call her when its done. Please advise

## 2022-10-01 ENCOUNTER — Ambulatory Visit: Payer: 59 | Attending: Cardiovascular Disease

## 2022-10-01 DIAGNOSIS — Z79899 Other long term (current) drug therapy: Secondary | ICD-10-CM

## 2022-10-01 DIAGNOSIS — I5042 Chronic combined systolic (congestive) and diastolic (congestive) heart failure: Secondary | ICD-10-CM

## 2022-10-01 DIAGNOSIS — I10 Essential (primary) hypertension: Secondary | ICD-10-CM

## 2022-10-01 DIAGNOSIS — I2510 Atherosclerotic heart disease of native coronary artery without angina pectoris: Secondary | ICD-10-CM

## 2022-10-01 LAB — BASIC METABOLIC PANEL
BUN/Creatinine Ratio: 8 — ABNORMAL LOW (ref 12–28)
BUN: 7 mg/dL — ABNORMAL LOW (ref 8–27)
CO2: 24 mmol/L (ref 20–29)
Calcium: 9.5 mg/dL (ref 8.7–10.3)
Chloride: 107 mmol/L — ABNORMAL HIGH (ref 96–106)
Creatinine, Ser: 0.93 mg/dL (ref 0.57–1.00)
Glucose: 71 mg/dL (ref 70–99)
Potassium: 4.2 mmol/L (ref 3.5–5.2)
Sodium: 144 mmol/L (ref 134–144)
eGFR: 63 mL/min/{1.73_m2} (ref >59)

## 2022-10-04 ENCOUNTER — Telehealth: Payer: Self-pay | Admitting: Cardiovascular Disease

## 2022-10-04 NOTE — Telephone Encounter (Signed)
Patient returning call for lab results. 

## 2022-10-07 NOTE — Telephone Encounter (Signed)
Patient notified of stable results

## 2022-10-07 NOTE — Telephone Encounter (Signed)
-----   Message from Thayer Headings, MD sent at 10/01/2022  5:47 PM EST ----- Bmp is stable

## 2022-10-08 ENCOUNTER — Encounter: Payer: Self-pay | Admitting: Podiatry

## 2022-10-08 ENCOUNTER — Ambulatory Visit (INDEPENDENT_AMBULATORY_CARE_PROVIDER_SITE_OTHER): Payer: 59 | Admitting: Podiatry

## 2022-10-08 DIAGNOSIS — B351 Tinea unguium: Secondary | ICD-10-CM

## 2022-10-08 DIAGNOSIS — M79676 Pain in unspecified toe(s): Secondary | ICD-10-CM

## 2022-10-08 DIAGNOSIS — M79609 Pain in unspecified limb: Secondary | ICD-10-CM

## 2022-10-08 DIAGNOSIS — E1169 Type 2 diabetes mellitus with other specified complication: Secondary | ICD-10-CM

## 2022-10-08 NOTE — Progress Notes (Signed)
  Subjective:  Patient ID: Paula Wong, female    DOB: 1945/11/06,   MRN: [id number]  No chief complaint on file.   77 y.o. female presents concern of thickened elongated and painful nails that are difficult to trim. Requesting to have them trimmed today. Relates burning and tingling in their feet. Patient is diabetic and last A1c was  Lab Results  Component Value Date   HGBA1C 6.3 (H) 04/13/2022   .   PCP:  Arthur Holms, NP     PCP:  Arthur Holms, NP    . Denies any other pedal complaints. Denies n/v/f/c.   Past Medical History:  Diagnosis Date   Allergy    Diabetes ([place])    Diverticulosis    GERD (gastroesophageal reflux disease)    Hiatal hernia    Hyperlipidemia    Hypertension    Neuropathy    feet   Schatzki's ring     Objective:  Physical Exam: Vascular: DP/PT pulses 2/4 bilateral. CFT <3 seconds. Absent hair growth on digits. Edema noted to bilateral lower extremities. Xerosis noted bilaterally.  Skin. No lacerations or abrasions bilateral feet. Nails 1-5 bilateral  are thickened discolored and elongated with subungual debris.  Musculoskeletal: MMT 5/5 bilateral lower extremities in DF, PF, Inversion and Eversion. Deceased ROM in DF of ankle joint.  Neurological: Sensation intact to light touch. Protective sensation intact bilateral.    Assessment:   1. Pain due to onychomycosis of nail   2. Type 2 diabetes mellitus with other specified complication, without long-term current use of insulin ([place])      Plan:  Patient was evaluated and treated and all questions answered. -Discussed and educated patient on diabetic foot care, especially with  regards to the vascular, neurological and musculoskeletal systems.  -Stressed the importance of good glycemic control and the detriment of not  controlling glucose levels in relation to the foot. -Discussed supportive shoes at all times and checking feet regularly.  -Mechanically debrided all nails 1-5 bilateral using  sterile nail nipper and filed with dremel without incident  -Answered all patient questions -Patient to return  in 3 months for at risk foot care -Patient advised to call the office if any problems or questions arise in the meantime.    Lorenda Peck, DPM

## 2022-10-24 ENCOUNTER — Ambulatory Visit: Payer: 59

## 2022-10-24 DIAGNOSIS — I5042 Chronic combined systolic (congestive) and diastolic (congestive) heart failure: Secondary | ICD-10-CM | POA: Diagnosis not present

## 2022-10-25 LAB — CUP PACEART REMOTE DEVICE CHECK
Date Time Interrogation Session: 20240307155817
Pulse Gen Serial Number: 511012456

## 2022-11-01 ENCOUNTER — Telehealth: Payer: Self-pay | Admitting: *Deleted

## 2022-11-01 ENCOUNTER — Ambulatory Visit: Payer: Self-pay | Admitting: General Surgery

## 2022-11-01 NOTE — H&P (Signed)
Chief Complaint: LONG TERM FOLLOW UP (LT SHOULDER CYST)       History of Present Illness: Paula Wong is a 77 y.o. female who is seen today as an office consultation at the request of Dr. Pasty Arch for evaluation of LONG TERM FOLLOW UP (LT SHOULDER CYST) .   Patient is a 76 year old female who follows back up today for a left trap epidermal inclusion cyst. Patient states that is gotten larger and continues with some itching.  We previously had seen her and she had been on Plavix.  She recently at that time had a cardiac stent.  Likely was unable to be off of the Plavix for removal of the cyst.   She states that she has had no further issues since that time.  She recently followed up with Dr. Acie Fredrickson in January of this year.  She continues on Plavix currently.       Review of Systems: A complete review of systems was obtained from the patient.  I have reviewed this information and discussed as appropriate with the patient.  See HPI as well for other ROS.   Review of Systems  Constitutional:  Negative for fever.  HENT:  Negative for congestion.   Eyes:  Negative for blurred vision.  Respiratory:  Negative for cough, shortness of breath and wheezing.   Cardiovascular:  Negative for chest pain and palpitations.  Gastrointestinal:  Negative for heartburn.  Genitourinary:  Negative for dysuria.  Musculoskeletal:  Negative for myalgias.  Skin:  Negative for rash.  Neurological:  Negative for dizziness and headaches.  Psychiatric/Behavioral:  Negative for depression and suicidal ideas.   All other systems reviewed and are negative.       Medical History: Past Medical History Past Medical History: Diagnosis Date  Arthritis    CVA (cerebral vascular accident) (CMS-HCC)    Diabetes mellitus without complication (CMS-HCC)    Hyperlipidemia    Hypertension        There is no problem list on file for this patient.     Past Surgical History History reviewed. No pertinent surgical  history.     Allergies No Known Allergies    Current Outpatient Medications on File Prior to Visit Medication Sig Dispense Refill  amLODIPine (NORVASC) 10 MG tablet Take 5 mg by mouth once daily      atorvastatin (LIPITOR) 80 MG tablet Take 80 mg by mouth once daily      clopidogreL (PLAVIX) 75 mg tablet Take 75 mg by mouth once daily      ENTRESTO 24-26 mg tablet Take 1 tablet by mouth 2 (two) times daily      FARXIGA 10 mg tablet Take 10 mg by mouth once daily      gabapentin (NEURONTIN) 300 MG capsule Take by mouth      insulin degludec (TRESIBA U-100 INSULIN SUBQ) Inject subcutaneously      metFORMIN (GLUCOPHAGE) 500 MG tablet TAKE 1 TABLET BY MOUTH TWICE DAILY WITH MORNING MEAL AND WITH EVENING MEAL      metoprolol tartrate (LOPRESSOR) 50 MG tablet Take 50 mg by mouth 2 (two) times daily with meals      OZEMPIC 1 mg/dose (4 mg/3 mL) pen injector INJECT 1MG  BY SUBCUTANEOUS ROUTE ONCE A WEEK      pantoprazole (PROTONIX) 20 MG DR tablet Take 20 mg by mouth once daily      spironolactone (ALDACTONE) 25 MG tablet Take by mouth       No current facility-administered medications on file  prior to visit.     Family History History reviewed. No pertinent family history.     Social History   Tobacco Use Smoking Status Former  Types: Cigarettes Smokeless Tobacco Never     Social History Social History    Socioeconomic History  Marital status: Single Tobacco Use  Smoking status: Former     Types: Cigarettes  Smokeless tobacco: Never Substance and Sexual Activity  Alcohol use: Not Currently  Drug use: Never      Objective:     Vitals:   11/01/22 0912 11/01/22 0913 Weight: 82.4 kg (181 lb 9.6 oz)   Height: 165.1 cm (5\' 5" )   PainSc:   0-No pain   Body mass index is 30.22 kg/m.   Physical Exam Constitutional:      Appearance: Normal appearance.  HENT:     Head: Normocephalic and atraumatic.     Mouth/Throat:     Mouth: Mucous membranes are moist.      Pharynx: Oropharynx is clear.  Eyes:     General: No scleral icterus.    Pupils: Pupils are equal, round, and reactive to light.  Cardiovascular:     Rate and Rhythm: Normal rate and regular rhythm.     Pulses: Normal pulses.     Heart sounds: No murmur heard.    No friction rub. No gallop.  Pulmonary:     Effort: Pulmonary effort is normal. No respiratory distress.     Breath sounds: Normal breath sounds. No stridor.  Abdominal:     General: Abdomen is flat.  Musculoskeletal:        General: No swelling.  Skin:    General: Skin is warm.           Comments: 4x3cm  Neurological:     General: No focal deficit present.     Mental Status: She is alert and oriented to person, place, and time. Mental status is at baseline.  Psychiatric:        Mood and Affect: Mood normal.        Thought Content: Thought content normal.        Judgment: Judgment normal.          Assessment and Plan: Diagnoses and all orders for this visit:   Sebaceous cyst     Paula Wong is a 77 y.o. female    1. Patient with a left trapezius sebaceous cyst. 2. 2.  Patient elected proceed to the for excision of left sebaceous cyst.  We discussed the risk and benefits of the procedure to include but not limited to: Infection,, bleeding, damage surrounding structures, possible recurrence.  Patient voiced understanding wished to proceed. 3. Will have the patient be cleared to be off Plavix prior to surgery.  Will reach out to Dr. Acie Fredrickson for this clearance.  Will have scheduling call her thereafter.           No follow-ups on file.   Ralene Ok, MD, [facility], [facility]

## 2022-11-01 NOTE — Telephone Encounter (Signed)
   Name: Paula Wong  DOB: [birth year]  MRN: [id number]  Primary Cardiologist: None  Chart reviewed as part of pre-operative protocol coverage. Because of [name]'s past medical history and time since last visit, she will require a follow-up in-office visit in order to better assess preoperative cardiovascular risk.  Patient is pending repeat echocardiogram and follow-up with Dr. Acie Fredrickson.  Pre-op covering staff: - Please schedule appointment and call patient to inform them. If patient already had an upcoming appointment within acceptable timeframe, please add "pre-op clearance" to the appointment notes so provider is aware. - Please contact requesting surgeon's office via preferred method (i.e, phone, fax) to inform them of need for appointment prior to surgery.   [name], NP  11/01/2022, 11:38 AM

## 2022-11-01 NOTE — Telephone Encounter (Signed)
Spoke with patient who states that she is fine with keeping her ECHO on 4/19 and Nahser 4/23 appointment. I will fax over recommendations to requesting surgeon's office.

## 2022-11-01 NOTE — Telephone Encounter (Signed)
I just s/w the pt and she has been scheduled for an echo 3/19 and a f/u with Dr. Acie Fredrickson 11/09/22. I have added need pre op clearance to appt notes. I will update all parties involved.

## 2022-11-01 NOTE — Telephone Encounter (Signed)
   Pre-operative Risk Assessment    Patient Name: Paula Wong  DOB: [birth year] MRN: [id number]      Request for Surgical Clearance    Procedure:   EXCISION OF BACK CYST SURGERY  Date of Surgery:  Clearance TBD                                 Surgeon:  Ralene Ok, [facility] Surgeon's Group or Practice Name:  [place] Phone number:  [phone] Fax number:  [phone]   Type of Clearance Requested:   - Medical  - Pharmacy:  Hold Clopidogrel (Plavix) NOT INDICATED HOW LONG   Type of Anesthesia:  General    Additional requests/questions:    Astrid Divine   11/01/2022, 11:30 AM

## 2022-11-25 ENCOUNTER — Ambulatory Visit (INDEPENDENT_AMBULATORY_CARE_PROVIDER_SITE_OTHER): Payer: 59

## 2022-11-25 DIAGNOSIS — I5042 Chronic combined systolic (congestive) and diastolic (congestive) heart failure: Secondary | ICD-10-CM | POA: Diagnosis not present

## 2022-11-25 LAB — CUP PACEART REMOTE DEVICE CHECK
Date Time Interrogation Session: 20240406020238
Pulse Gen Serial Number: 511012456

## 2022-11-26 NOTE — Progress Notes (Signed)
Carelink Summary Report / Loop Recorder 

## 2022-12-06 ENCOUNTER — Ambulatory Visit (HOSPITAL_COMMUNITY): Payer: 59 | Attending: Cardiology

## 2022-12-06 DIAGNOSIS — I2510 Atherosclerotic heart disease of native coronary artery without angina pectoris: Secondary | ICD-10-CM

## 2022-12-06 DIAGNOSIS — I10 Essential (primary) hypertension: Secondary | ICD-10-CM

## 2022-12-06 DIAGNOSIS — Z79899 Other long term (current) drug therapy: Secondary | ICD-10-CM | POA: Diagnosis not present

## 2022-12-06 DIAGNOSIS — I5042 Chronic combined systolic (congestive) and diastolic (congestive) heart failure: Secondary | ICD-10-CM | POA: Diagnosis present

## 2022-12-06 LAB — ECHOCARDIOGRAM COMPLETE: S' Lateral: 5.7 cm

## 2022-12-08 NOTE — Telephone Encounter (Signed)
Pt is on plavix per neurology for a stroke. Cardiology did not start the plavix  I will not be able to provide clearance for holding plavix She will need to see neuro or per primary to make that determination  This has already been address by Gillian Shields in Sept.  26, 2023  "Given her CVA 04/12/22, would recommend input from neurology regarding holding Plavix. Would be permissible to hold 5 days from cardiac perspective. "  PN

## 2022-12-09 NOTE — Telephone Encounter (Signed)
Please notify requesting office that patient needs to keep appointment with Dr. Elease Hashimoto on 12/10/22 for discussion of reduced LV function. Request to hold Plavix will need to be directed to prescribing provider.  Levi Aland, NP-C  12/09/2022, 10:14 AM [address] Office [phone] Fax [phone]

## 2022-12-09 NOTE — Telephone Encounter (Signed)
See notes from pre op APP today Eligha Bridegroom, NP. I will update all parties involved.

## 2022-12-09 NOTE — Telephone Encounter (Signed)
I will forward this to pre op APP to review notes from Dr. Elease Hashimoto.

## 2022-12-10 ENCOUNTER — Ambulatory Visit: Payer: 59 | Attending: Cardiovascular Disease | Admitting: Cardiovascular Disease

## 2022-12-10 ENCOUNTER — Encounter: Payer: Self-pay | Admitting: Cardiovascular Disease

## 2022-12-10 VITALS — BP 122/82 | HR 91 | Ht 65.0 in | Wt 182.6 lb

## 2022-12-10 DIAGNOSIS — I5042 Chronic combined systolic (congestive) and diastolic (congestive) heart failure: Secondary | ICD-10-CM | POA: Diagnosis not present

## 2022-12-10 DIAGNOSIS — I2510 Atherosclerotic heart disease of native coronary artery without angina pectoris: Secondary | ICD-10-CM

## 2022-12-10 NOTE — Progress Notes (Signed)
Cardiology Office Note:    Date:  12/10/2022   ID:  Paula Wong, DOB [birth year], MRN [id number]  PCP:  Loura Back, NP   [facility] Providers Cardiologist:  Pria Klosinski  EP:  Lalla Brothers   Referring MD: Loura Back, NP   Chief Complaint  Patient presents with   Coronary Artery Disease        Congestive Heart Failure         History of Present Illness:    Paula Wong is a 77 y.o. female with a hx of CVA , CHF I met her during a recent hospitalization for her stroke   Coronary artery calcium score is 2553 RCA - mild - mod  CAD LM - moderate CAD LAD - mod disease in 2 large diags,  severe disease in mid vessel and distal vessel  Ramus :  proximal severe disease  LCx: moderate CAD  Cath on Aug. 29, 2023:   mod - severe CAD . Her LV dysfunction seemed out of proportion to her degree of CAD .  If she develops symptoms of CP, can consider shockwave therapy or CSI atherectomy    Jan. 19, 2024  Paula Wong is seen today for follow up of her hx of CVA, CHF Has moderate CAD.  Her LV dysfunction seems to be out of proportion to her degree of her CAD .  Echocardiogram in August, 2023 reveals EF of 20 to 25%.  Current medications include  Entresto 24-26 twice a day Metoprolol succinate 50 mg twice a day Farxiga 10 mg a day Aldactone 12.5 mg a day  HR and BP are well controlled.  Not getting any exercise    December 10, 2022 Seen for follow-up of her coronary artery disease and CVA.  She is here for preoperative visit.  She has a sebaceous cyst on her left side of her neck.  She is on plavix   She is at low risk for her upcoming cyst  removal.  She needs clearance to stop plavix She may hold her Plavix for 5 days prior to procedure.  She should restart plavix as soon as it is safe from a surgical standpoint     Past Medical History:  Diagnosis Date   Allergy    Diabetes    Diverticulosis    GERD (gastroesophageal reflux disease)    Hiatal hernia     Hyperlipidemia    Hypertension    Neuropathy    feet   Schatzki's ring     Past Surgical History:  Procedure Laterality Date   LEFT HEART CATH AND CORONARY ANGIOGRAPHY N/A 04/16/2022   Procedure: LEFT HEART CATH AND CORONARY ANGIOGRAPHY;  Surgeon: Lennette Bihari, MD;  Location: [facility];  Service: Cardiovascular;  Laterality: N/A;   LOOP RECORDER INSERTION N/A 04/15/2022   Procedure: LOOP RECORDER INSERTION;  Surgeon: Lanier Prude, MD;  Location: [facility];  Service: Cardiovascular;  Laterality: N/A;   MASS EXCISION Right 06/07/2019   Procedure: Dawson Bills OF KELOID TO RIGHT NECK;  Surgeon: Louisa Second, MD;  Location: [facility];  Service: Plastics;  Laterality: Right;   TOOTH EXTRACTION  2013    Current Medications: Current Meds  Medication Sig   atorvastatin (LIPITOR) 80 MG tablet Take 80 mg by mouth daily.   clopidogrel (PLAVIX) 75 MG tablet Take 1 tablet (75 mg total) by mouth daily.   FARXIGA 10 MG TABS tablet Take 10 mg by mouth daily.   gabapentin (NEURONTIN) 600 MG  tablet Take 600 mg by mouth 2 (two) times daily.   Linagliptin (TRADJENTA PO) Take 1 capsule by mouth daily.   magnesium oxide (MAG-OX) 400 MG tablet Take 400 mg by mouth daily.   metFORMIN (GLUCOPHAGE-XR) 500 MG 24 hr tablet Take 500 mg by mouth daily with breakfast.   metoprolol succinate (TOPROL-XL) 50 MG 24 hr tablet Take 50 mg by mouth 2 (two) times daily.   Multiple Vitamin (MULTIVITAMIN) tablet Take 1 tablet by mouth daily.   OZEMPIC, 1 MG/DOSE, 4 MG/3ML SOPN Inject 1 mg into the skin once a week.   pantoprazole (PROTONIX) 20 MG tablet Take 1 tablet (20 mg total) by mouth daily.   sacubitril-valsartan (ENTRESTO) 49-51 MG Take 1 tablet by mouth 2 (two) times daily.   spironolactone (ALDACTONE) 25 MG tablet Take 12.5 mg by mouth daily.   TRESIBA FLEXTOUCH 200 UNIT/ML SOPN Take 80 Units by mouth at bedtime.    [DISCONTINUED] OZEMPIC, 0.25 OR 0.5 MG/DOSE, 2 MG/1.5ML  SOPN Inject 0.5 mg into the skin once a week.     Allergies:   Patient has no known allergies.   Social History   Socioeconomic History   Marital status: Single    Spouse name: Not on file   Number of children: Not on file   Years of education: Not on file   Highest education level: Not on file  Occupational History   Occupation: Retired  Tobacco Use   Smoking status: Former    Types: Cigarettes    Quit date: 08/20/2003    Years since quitting: 19.3   Smokeless tobacco: Never  Vaping Use   Vaping Use: Never used  Substance and Sexual Activity   Alcohol use: No   Drug use: No   Sexual activity: Not on file  Other Topics Concern   Not on file  Social History Narrative   Not on file   Social Determinants of Health   Financial Resource Strain: Not on file  Food Insecurity: Not on file  Transportation Needs: Unmet Transportation Needs (05/18/2019)   PRAPARE - Administrator, Civil Service (Medical): Yes    Lack of Transportation (Non-Medical): Yes  Physical Activity: Not on file  Stress: Not on file  Social Connections: Not on file     Family History: The patient's family history includes Cancer in her sister; Colon cancer (age of onset: 17) in her mother; Heart attack in her brother and father. There is no history of Stomach cancer, Pancreatic cancer, or Esophageal cancer.  ROS:   Please see the history of present illness.     All other systems reviewed and are negative.  EKGs/Labs/Other Studies Reviewed:    The following studies were reviewed today:    Recent Labs: 04/12/2022: ALT 15 04/17/2022: Hemoglobin 13.8; Magnesium 2.3; Platelets 289 10/01/2022: BUN 7; Creatinine, Ser 0.93; Potassium 4.2; Sodium 144  Recent Lipid Panel    Component Value Date/Time   CHOL 121 04/13/2022 0500   TRIG 82 04/13/2022 0500   HDL 35 (L) 04/13/2022 0500   CHOLHDL 3.5 04/13/2022 0500   VLDL 16 04/13/2022 0500   LDLCALC 70 04/13/2022 0500   LDLCALC 90 10/02/2020  1100     Risk Assessment/Calculations:                Physical Exam: Blood pressure 122/82, pulse 91, height  (1.651 m), weight 182 lb 9.6 oz (82.8 kg), SpO2 96 %.       GEN:  Well nourished, well  developed in no acute distress HEENT: Normal NECK: No JVD; No carotid bruits LYMPHATICS: No lymphadenopathy CARDIAC: RRR , no murmurs, rubs, gallops RESPIRATORY:  Clear to auscultation without rales, wheezing or rhonchi  ABDOMEN: Soft, non-tender, non-distended MUSCULOSKELETAL:  No edema; No deformity  SKIN: Warm and dry NEUROLOGIC:  Alert and oriented x 3  EKG: December 10, 2022: Normal sinus rhythm at 91.  Occasional premature atrial contractions.  Incomplete right bundle branch block.  Previous septal myocardial infarction.  Nonspecific ST and T wave changes.  ASSESSMENT:    1. Chronic combined systolic and diastolic heart failure   2. Coronary artery disease involving native coronary artery of native heart without angina pectoris      PLAN:      CVA:   has an implantable loop recorder    2.  Chronic combined CHF:   Cath showed some disease in the small branch vessels but the degree of CAD was not thought to be enough to be the etiology of her CHF .   Her last EF was 20 to 25% by echo in August, 2023.  She is on Entresto 49-51 twice a day.  Continue current medications.  3.   CAD :  has moderate CAD of several branch vessels.  The stenosis are calcified.  We could attempt PCi ( likely with shockwave therapy or rotational atherectomy)   At present she is not having any angina.  She needs to have a dermatologic procedure for removal of the cyst.  She may stop her Plavix for 5 to 7 days prior to the procedure.  She should restart the Plavix 75 mg a day shortly after her procedure.  Will see her again in 3 months for follow-up visit.     Medication Adjustments/Labs and Tests Ordered: Current medicines are reviewed at length with the patient today.  Concerns  regarding medicines are outlined above.  Orders Placed This Encounter  Procedures   EKG 12-Lead   No orders of the defined types were placed in this encounter.    Patient Instructions  Medication Instructions:  Your physician recommends that you continue on your current medications as directed. Please refer to the Current Medication list given to you today.  *If you need a refill on your cardiac medications before your next appointment, please call your pharmacy*   Lab Work: NONE If you have labs (blood work) drawn today and your tests are completely normal, you will receive your results only by: MyChart Message (if you have MyChart) OR A paper copy in the mail If you have any lab test that is abnormal or we need to change your treatment, we will call you to review the results.   Testing/Procedures: NONE   Follow-Up: At [facility], you and your health needs are our priority.  As part of our continuing mission to provide you with exceptional heart care, we have created designated Provider Care Teams.  These Care Teams include your primary Cardiologist (physician) and Advanced Practice Providers (APPs -  Physician Assistants and Nurse Practitioners) who all work together to provide you with the care you need, when you need it.  We recommend signing up for the patient portal called "MyChart".  Sign up information is provided on this After Visit Summary.  MyChart is used to connect with patients for Virtual Visits (Telemedicine).  Patients are able to view lab/test results, encounter notes, upcoming appointments, etc.  Non-urgent messages can be sent to your provider as well.   To learn  more about what you can do with MyChart, go to [email or web address].    Your next appointment:   3 month(s)  Provider:   Kristeen Miss, MD     Signed, Kristeen Miss, MD  12/10/2022 4:09 PM    [facility]

## 2022-12-10 NOTE — Telephone Encounter (Signed)
Paula Wong is at low risk for her cyst removal She may hold her Plavix for 5-7 days prior to procedure  She should restart the plavix 1-2 days after the procedure .    Kristeen Miss, MD  12/10/2022 10:59 AM    [facility] [address] [place],  [address] [place], [place]  [address] Phone: [phone]; Fax: [phone]

## 2022-12-10 NOTE — Patient Instructions (Signed)
Medication Instructions:  Your physician recommends that you continue on your current medications as directed. Please refer to the Current Medication list given to you today.  *If you need a refill on your cardiac medications before your next appointment, please call your pharmacy*   Lab Work: NONE If you have labs (blood work) drawn today and your tests are completely normal, you will receive your results only by: MyChart Message (if you have MyChart) OR A paper copy in the mail If you have any lab test that is abnormal or we need to change your treatment, we will call you to review the results.   Testing/Procedures: NONE   Follow-Up: At [facility], you and your health needs are our priority.  As part of our continuing mission to provide you with exceptional heart care, we have created designated Provider Care Teams.  These Care Teams include your primary Cardiologist (physician) and Advanced Practice Providers (APPs -  Physician Assistants and Nurse Practitioners) who all work together to provide you with the care you need, when you need it.  We recommend signing up for the patient portal called "MyChart".  Sign up information is provided on this After Visit Summary.  MyChart is used to connect with patients for Virtual Visits (Telemedicine).  Patients are able to view lab/test results, encounter notes, upcoming appointments, etc.  Non-urgent messages can be sent to your provider as well.   To learn more about what you can do with MyChart, go to [email or web address].    Your next appointment:   3 month(s)  Provider:   Philip Nahser, MD      

## 2022-12-26 ENCOUNTER — Ambulatory Visit (INDEPENDENT_AMBULATORY_CARE_PROVIDER_SITE_OTHER): Payer: 59

## 2022-12-26 DIAGNOSIS — I639 Cerebral infarction, unspecified: Secondary | ICD-10-CM

## 2022-12-26 LAB — CUP PACEART REMOTE DEVICE CHECK
Date Time Interrogation Session: 20240509020615
Pulse Gen Serial Number: 511012456

## 2022-12-30 ENCOUNTER — Telehealth: Payer: Self-pay

## 2022-12-30 ENCOUNTER — Encounter (HOSPITAL_COMMUNITY): Payer: Self-pay

## 2022-12-30 ENCOUNTER — Other Ambulatory Visit: Payer: Self-pay

## 2022-12-30 ENCOUNTER — Inpatient Hospital Stay (HOSPITAL_COMMUNITY)
Admission: EM | Admit: 2022-12-30 | Discharge: 2023-01-02 | DRG: 324 | Disposition: A | Discharge status: Home / Self Care | Payer: 59 | Attending: Cardiovascular Disease | Admitting: Cardiovascular Disease

## 2022-12-30 ENCOUNTER — Emergency Department (HOSPITAL_COMMUNITY): Payer: 59

## 2022-12-30 DIAGNOSIS — Z7902 Long term (current) use of antithrombotics/antiplatelets: Secondary | ICD-10-CM | POA: Diagnosis not present

## 2022-12-30 DIAGNOSIS — I255 Ischemic cardiomyopathy: Secondary | ICD-10-CM | POA: Diagnosis present

## 2022-12-30 DIAGNOSIS — I4729 Other ventricular tachycardia: Secondary | ICD-10-CM | POA: Diagnosis present

## 2022-12-30 DIAGNOSIS — Z87891 Personal history of nicotine dependence: Secondary | ICD-10-CM | POA: Diagnosis not present

## 2022-12-30 DIAGNOSIS — I340 Nonrheumatic mitral (valve) insufficiency: Secondary | ICD-10-CM | POA: Insufficient documentation

## 2022-12-30 DIAGNOSIS — I472 Ventricular tachycardia, unspecified: Secondary | ICD-10-CM | POA: Diagnosis present

## 2022-12-30 DIAGNOSIS — Z7984 Long term (current) use of oral hypoglycemic drugs: Secondary | ICD-10-CM | POA: Diagnosis not present

## 2022-12-30 DIAGNOSIS — I25118 Atherosclerotic heart disease of native coronary artery with other forms of angina pectoris: Secondary | ICD-10-CM | POA: Diagnosis not present

## 2022-12-30 DIAGNOSIS — I493 Ventricular premature depolarization: Secondary | ICD-10-CM | POA: Diagnosis present

## 2022-12-30 DIAGNOSIS — Z7985 Long-term (current) use of injectable non-insulin antidiabetic drugs: Secondary | ICD-10-CM | POA: Diagnosis not present

## 2022-12-30 DIAGNOSIS — I130 Hypertensive heart and chronic kidney disease with heart failure and stage 1 through stage 4 chronic kidney disease, or unspecified chronic kidney disease: Secondary | ICD-10-CM | POA: Diagnosis present

## 2022-12-30 DIAGNOSIS — I5022 Chronic systolic (congestive) heart failure: Secondary | ICD-10-CM | POA: Insufficient documentation

## 2022-12-30 DIAGNOSIS — I2510 Atherosclerotic heart disease of native coronary artery without angina pectoris: Secondary | ICD-10-CM | POA: Diagnosis present

## 2022-12-30 DIAGNOSIS — N182 Chronic kidney disease, stage 2 (mild): Secondary | ICD-10-CM | POA: Insufficient documentation

## 2022-12-30 DIAGNOSIS — I455 Other specified heart block: Secondary | ICD-10-CM

## 2022-12-30 DIAGNOSIS — E7800 Pure hypercholesterolemia, unspecified: Secondary | ICD-10-CM | POA: Diagnosis present

## 2022-12-30 DIAGNOSIS — K219 Gastro-esophageal reflux disease without esophagitis: Secondary | ICD-10-CM | POA: Diagnosis present

## 2022-12-30 DIAGNOSIS — Z8249 Family history of ischemic heart disease and other diseases of the circulatory system: Secondary | ICD-10-CM | POA: Diagnosis not present

## 2022-12-30 DIAGNOSIS — Z8673 Personal history of transient ischemic attack (TIA), and cerebral infarction without residual deficits: Secondary | ICD-10-CM | POA: Diagnosis not present

## 2022-12-30 DIAGNOSIS — I10 Essential (primary) hypertension: Secondary | ICD-10-CM | POA: Diagnosis present

## 2022-12-30 DIAGNOSIS — E876 Hypokalemia: Secondary | ICD-10-CM | POA: Diagnosis present

## 2022-12-30 DIAGNOSIS — E785 Hyperlipidemia, unspecified: Secondary | ICD-10-CM | POA: Diagnosis present

## 2022-12-30 DIAGNOSIS — Z79899 Other long term (current) drug therapy: Secondary | ICD-10-CM

## 2022-12-30 DIAGNOSIS — Z800 Family history of malignant neoplasm of digestive organs: Secondary | ICD-10-CM | POA: Diagnosis not present

## 2022-12-30 DIAGNOSIS — I051 Rheumatic mitral insufficiency: Secondary | ICD-10-CM | POA: Diagnosis present

## 2022-12-30 DIAGNOSIS — Z955 Presence of coronary angioplasty implant and graft: Secondary | ICD-10-CM

## 2022-12-30 DIAGNOSIS — E119 Type 2 diabetes mellitus without complications: Secondary | ICD-10-CM

## 2022-12-30 DIAGNOSIS — E1140 Type 2 diabetes mellitus with diabetic neuropathy, unspecified: Secondary | ICD-10-CM | POA: Diagnosis present

## 2022-12-30 DIAGNOSIS — E1122 Type 2 diabetes mellitus with diabetic chronic kidney disease: Secondary | ICD-10-CM | POA: Diagnosis present

## 2022-12-30 HISTORY — DX: Ischemic cardiomyopathy: I25.5

## 2022-12-30 HISTORY — DX: Atherosclerotic heart disease of native coronary artery without angina pectoris: I25.10

## 2022-12-30 HISTORY — DX: Cerebral infarction, unspecified: I63.9

## 2022-12-30 HISTORY — DX: Chronic systolic (congestive) heart failure: I50.22

## 2022-12-30 HISTORY — DX: Presence of other cardiac implants and grafts: Z95.818

## 2022-12-30 HISTORY — DX: Ventricular tachycardia, unspecified: I47.20

## 2022-12-30 LAB — CBC WITH DIFFERENTIAL/PLATELET
Abs Immature Granulocytes: 0.01 10*3/uL (ref 0.00–0.07)
Basophils Absolute: 0 10*3/uL (ref 0.0–0.1)
Basophils Relative: 1 %
Eosinophils Absolute: 0.1 10*3/uL (ref 0.0–0.5)
Eosinophils Relative: 2 %
HCT: 44.3 % (ref 36.0–46.0)
Hemoglobin: 13.5 g/dL (ref 12.0–15.0)
Immature Granulocytes: 0 %
Lymphocytes Relative: 29 %
Lymphs Abs: 1.6 10*3/uL (ref 0.7–4.0)
MCH: 25.9 pg — ABNORMAL LOW (ref 26.0–34.0)
MCHC: 30.5 g/dL (ref 30.0–36.0)
MCV: 84.9 fL (ref 80.0–100.0)
Monocytes Absolute: 0.6 10*3/uL (ref 0.1–1.0)
Monocytes Relative: 11 %
Neutro Abs: 3.2 10*3/uL (ref 1.7–7.7)
Neutrophils Relative %: 57 %
Platelets: 283 10*3/uL (ref 150–400)
RBC: 5.22 MIL/uL — ABNORMAL HIGH (ref 3.87–5.11)
RDW: 13.9 % (ref 11.5–15.5)
WBC: 5.6 10*3/uL (ref 4.0–10.5)
nRBC: 0 % (ref 0.0–0.2)

## 2022-12-30 LAB — CBC
HCT: 47 % — ABNORMAL HIGH (ref 36.0–46.0)
Hemoglobin: 14.7 g/dL (ref 12.0–15.0)
MCH: 26.1 pg (ref 26.0–34.0)
MCHC: 31.3 g/dL (ref 30.0–36.0)
MCV: 83.5 fL (ref 80.0–100.0)
Platelets: 276 10*3/uL (ref 150–400)
RBC: 5.63 MIL/uL — ABNORMAL HIGH (ref 3.87–5.11)
RDW: 14 % (ref 11.5–15.5)
WBC: 7.3 10*3/uL (ref 4.0–10.5)
nRBC: 0 % (ref 0.0–0.2)

## 2022-12-30 LAB — COMPREHENSIVE METABOLIC PANEL
ALT: 10 U/L (ref 0–44)
AST: 15 U/L (ref 15–41)
Albumin: 3.7 g/dL (ref 3.5–5.0)
Alkaline Phosphatase: 54 U/L (ref 38–126)
Anion gap: 10 (ref 5–15)
BUN: 7 mg/dL — ABNORMAL LOW (ref 8–23)
CO2: 24 mmol/L (ref 22–32)
Calcium: 8.8 mg/dL — ABNORMAL LOW (ref 8.9–10.3)
Chloride: 108 mmol/L (ref 98–111)
Creatinine, Ser: 1.01 mg/dL — ABNORMAL HIGH (ref 0.44–1.00)
GFR, Estimated: 57 mL/min — ABNORMAL LOW (ref >60)
Glucose, Bld: 109 mg/dL — ABNORMAL HIGH (ref 70–99)
Potassium: 3.6 mmol/L (ref 3.5–5.1)
Sodium: 142 mmol/L (ref 135–145)
Total Bilirubin: 1.2 mg/dL (ref 0.3–1.2)
Total Protein: 7.3 g/dL (ref 6.5–8.1)

## 2022-12-30 LAB — BRAIN NATRIURETIC PEPTIDE: B Natriuretic Peptide: 1847.2 pg/mL — ABNORMAL HIGH (ref 0.0–100.0)

## 2022-12-30 LAB — GLUCOSE, CAPILLARY: Glucose-Capillary: 121 mg/dL — ABNORMAL HIGH (ref 70–99)

## 2022-12-30 LAB — CREATININE, SERUM
Creatinine, Ser: 0.96 mg/dL (ref 0.44–1.00)
GFR, Estimated: >60 mL/min (ref >60)

## 2022-12-30 LAB — TROPONIN I (HIGH SENSITIVITY): Troponin I (High Sensitivity): 10 ng/L (ref <18)

## 2022-12-30 MED ORDER — SPIRONOLACTONE 12.5 MG HALF TABLET
12.5000 mg | ORAL_TABLET | Freq: Every day | ORAL | Status: DC
  Administered 2022-12-31 – 2023-01-02 (×3): 12.5 mg via ORAL
  Filled 2022-12-30 (×3): qty 1

## 2022-12-30 MED ORDER — ACETAMINOPHEN 325 MG PO TABS: 650.0000 mg | ORAL_TABLET | ORAL | Status: DC | PRN

## 2022-12-30 MED ORDER — SODIUM CHLORIDE 0.9% FLUSH
3.0000 mL | Freq: Two times a day (BID) | INTRAVENOUS | Status: DC
  Administered 2022-12-30: 3 mL via INTRAVENOUS

## 2022-12-30 MED ORDER — INSULIN GLARGINE-YFGN 100 UNIT/ML ~~LOC~~ SOLN
40.0000 [IU] | Freq: Every day | SUBCUTANEOUS | Status: DC
  Administered 2022-12-30 – 2023-01-01 (×3): 40 [IU] via SUBCUTANEOUS
  Filled 2022-12-30 (×5): qty 0.4

## 2022-12-30 MED ORDER — SODIUM CHLORIDE 0.9 % IV SOLN: 250.0000 mL | INTRAVENOUS | Status: DC | PRN

## 2022-12-30 MED ORDER — AMIODARONE LOAD VIA INFUSION
150.0000 mg | Freq: Once | INTRAVENOUS | Status: AC
  Administered 2022-12-30: 150 mg via INTRAVENOUS
  Filled 2022-12-30: qty 83.34

## 2022-12-30 MED ORDER — ATORVASTATIN CALCIUM 80 MG PO TABS
80.0000 mg | ORAL_TABLET | Freq: Every day | ORAL | Status: DC
  Administered 2022-12-31 – 2023-01-02 (×3): 80 mg via ORAL
  Filled 2022-12-30 (×3): qty 1

## 2022-12-30 MED ORDER — CLOPIDOGREL BISULFATE 75 MG PO TABS
75.0000 mg | ORAL_TABLET | Freq: Every day | ORAL | Status: DC
  Administered 2022-12-31 – 2023-01-02 (×3): 75 mg via ORAL
  Filled 2022-12-30 (×3): qty 1

## 2022-12-30 MED ORDER — AMIODARONE HCL IN DEXTROSE 360-4.14 MG/200ML-% IV SOLN
60.0000 mg/h | INTRAVENOUS | Status: DC
  Administered 2022-12-30 (×2): 60 mg/h via INTRAVENOUS
  Filled 2022-12-30 (×2): qty 200

## 2022-12-30 MED ORDER — DAPAGLIFLOZIN PROPANEDIOL 10 MG PO TABS
10.0000 mg | ORAL_TABLET | Freq: Every day | ORAL | Status: DC
  Administered 2022-12-31 – 2023-01-02 (×3): 10 mg via ORAL
  Filled 2022-12-30 (×3): qty 1

## 2022-12-30 MED ORDER — SODIUM CHLORIDE 0.9% FLUSH: 3.0000 mL | INTRAVENOUS | Status: DC | PRN

## 2022-12-30 MED ORDER — METOPROLOL SUCCINATE ER 50 MG PO TB24
50.0000 mg | ORAL_TABLET | Freq: Two times a day (BID) | ORAL | Status: DC
  Administered 2022-12-30 – 2023-01-02 (×6): 50 mg via ORAL
  Filled 2022-12-30 (×6): qty 1

## 2022-12-30 MED ORDER — ORAL CARE MOUTH RINSE: 15.0000 mL | OROMUCOSAL | Status: DC | PRN

## 2022-12-30 MED ORDER — MAGNESIUM OXIDE -MG SUPPLEMENT 400 (240 MG) MG PO TABS
400.0000 mg | ORAL_TABLET | Freq: Every day | ORAL | Status: DC
  Administered 2022-12-31 – 2023-01-02 (×3): 400 mg via ORAL
  Filled 2022-12-30 (×3): qty 1

## 2022-12-30 MED ORDER — ONDANSETRON HCL 4 MG/2ML IJ SOLN: 4.0000 mg | Freq: Four times a day (QID) | INTRAMUSCULAR | Status: DC | PRN

## 2022-12-30 MED ORDER — PANTOPRAZOLE SODIUM 20 MG PO TBEC
20.0000 mg | DELAYED_RELEASE_TABLET | Freq: Every day | ORAL | Status: DC
  Administered 2022-12-31 – 2023-01-02 (×3): 20 mg via ORAL
  Filled 2022-12-30 (×3): qty 1

## 2022-12-30 MED ORDER — ADULT MULTIVITAMIN W/MINERALS CH
1.0000 | ORAL_TABLET | Freq: Every day | ORAL | Status: DC
  Administered 2022-12-31 – 2023-01-02 (×3): 1 via ORAL
  Filled 2022-12-30 (×3): qty 1

## 2022-12-30 MED ORDER — HEPARIN SODIUM (PORCINE) 5000 UNIT/ML IJ SOLN
5000.0000 [IU] | Freq: Three times a day (TID) | INTRAMUSCULAR | Status: DC
  Administered 2022-12-30 – 2022-12-31 (×2): 5000 [IU] via SUBCUTANEOUS
  Filled 2022-12-30 (×2): qty 1

## 2022-12-30 MED ORDER — ASPIRIN 81 MG PO CHEW
81.0000 mg | CHEWABLE_TABLET | ORAL | Status: AC
  Administered 2022-12-31: 81 mg via ORAL
  Filled 2022-12-30: qty 1

## 2022-12-30 MED ORDER — POTASSIUM CHLORIDE CRYS ER 20 MEQ PO TBCR
40.0000 meq | EXTENDED_RELEASE_TABLET | Freq: Once | ORAL | Status: AC
  Administered 2022-12-30: 40 meq via ORAL
  Filled 2022-12-30: qty 2

## 2022-12-30 MED ORDER — SACUBITRIL-VALSARTAN 49-51 MG PO TABS
1.0000 | ORAL_TABLET | Freq: Two times a day (BID) | ORAL | Status: DC
  Administered 2022-12-30 – 2023-01-02 (×6): 1 via ORAL
  Filled 2022-12-30 (×7): qty 1

## 2022-12-30 MED ORDER — SODIUM CHLORIDE 0.9 % IV SOLN: INTRAVENOUS | Status: DC

## 2022-12-30 MED ORDER — FUROSEMIDE 10 MG/ML IJ SOLN
40.0000 mg | Freq: Once | INTRAMUSCULAR | Status: AC
  Administered 2022-12-30: 40 mg via INTRAVENOUS
  Filled 2022-12-30: qty 4

## 2022-12-30 MED ORDER — GABAPENTIN 300 MG PO CAPS
600.0000 mg | ORAL_CAPSULE | Freq: Two times a day (BID) | ORAL | Status: DC
  Administered 2022-12-30 – 2023-01-02 (×6): 600 mg via ORAL
  Filled 2022-12-30 (×6): qty 2

## 2022-12-30 MED ORDER — AMIODARONE HCL IN DEXTROSE 360-4.14 MG/200ML-% IV SOLN
30.0000 mg/h | INTRAVENOUS | Status: DC
  Administered 2022-12-30 – 2022-12-31 (×3): 30 mg/h via INTRAVENOUS
  Filled 2022-12-30 (×2): qty 200

## 2022-12-30 MED ORDER — NITROGLYCERIN 0.4 MG SL SUBL: 0.4000 mg | SUBLINGUAL_TABLET | SUBLINGUAL | Status: DC | PRN

## 2022-12-30 MED ORDER — LINAGLIPTIN 5 MG PO TABS
5.0000 mg | ORAL_TABLET | Freq: Every day | ORAL | Status: DC
  Administered 2022-12-31 – 2023-01-02 (×3): 5 mg via ORAL
  Filled 2022-12-30 (×3): qty 1

## 2022-12-30 NOTE — H&P (Signed)
Cardiology Admission History and Physical   Patient ID: Paula Wong MRN: [id number]; DOB: 23-Dec-1945   Admission date: 12/30/2022  PCP:  Loura Back, NP   [facility] Providers Cardiologist:  Kristeen Miss, MD        Chief Complaint: Ventricular tachycardia  Patient Profile:   Paula Wong is a 77 y.o. female with CAD and heart failure with reduced ejection fraction who is being seen 12/30/2022 for the evaluation of sustained ventricular tachycardia.  History of Present Illness:   [name] has known severe left ventricular dysfunction (EF 25 to 30% % by echocardiography with global dysfunction, most recently by echo on December 06, 2022) and multivessel coronary artery disease (40% ostial left main stenosis, 40% ostial LAD stenosis, 80% mid LAD stenosis, stenoses involving the origins of 2 large diagonal arteries, 70% RV branch stenosis), on a background of hypercholesterolemia, hypertension, history of remote stroke (August 2023) for which she had an implantable loop recorder placed.    Download of her loop recorder showed an episode of sustained ventricular tachycardia occurred 12/28/2022 at roughly 2200 hrs., with rates as high as 180 bpm, polymorphic, lasting for at least 23 seconds, with spontaneous termination followed by a lengthy pause of about 3 seconds, followed then by an idioventricular beat and a sinus beat and another 3-second pause before sinus rhythm is gradually reestablished, with very frequent isolated PVCs.  The episode was apparently asymptomatic.  The patient reports that around that time she was dozing off watching television and did not have angina, dyspnea or syncope.  She has not had problems with angina at rest or with activity, but has NYHA functional class II-III exertional dyspnea.  She can walk the length of 1 I will in the grocery store before she has to stop.  She does not have orthopnea or PND.  She has never experienced full-blown  syncope.  She has recovered fully from her stroke in August of last year (no acute cortical infarct involving the left occipital lobe).  She rarely has palpitations.  Denies lower extremity edema, intermittent claudication.  She has not had any serious bleeding problems or falls.  She has been taking clopidogrel ever since the stroke.  Current medications for heart failure include Entresto 24/26 mg twice daily, metoprolol succinate 50 mg twice daily, Farxiga 10 mg daily and spironolactone 12.5 mg daily, with which she has been compliant.  She is on clopidogrel therapy and high-dose atorvastatin.  She is also on [place] for her diabetes mellitus.  She has a epidermal inclusion cyst in the left trapezial area which she was planning to have surgically removed (with Dr. Derrell Lolling).   Past Medical History:  Diagnosis Date   Allergy    CAD (coronary artery disease)    a. 03/2022 Cath: LM 40ost, 82m, LAD 40ost/p, 28m, 71m, LCX 50m, RCA nl, RV branch 70-->Med rx.   Chronic HFrEF (heart failure with reduced ejection fraction) (HCC)    a. 11/2021 Echo: EF 20-25%; b. 03/2022 Echo: EF 20-25%; c. 11/2022 Echo: EF 25-30%, glob HK.   Diabetes (HCC)    Diverticulosis    GERD (gastroesophageal reflux disease)    Hiatal hernia    Hyperlipidemia    Hypertension    Implantable loop recorder present    a. 03/2022 s/p Abbott IQ EL+ (serial [id number]) ILR in setting of cryptogenic stroke.   Ischemic cardiomyopathy    a. 11/2021 Echo: EF 20-25%; b. 03/2022 Echo: EF 20-25%; c. 11/2022 Echo: EF 25-30%, glob  HK, nl RV fxn, mild-mod MR, mild AI, AoV sclerosis.   Neuropathy    feet   Schatzki's ring    Stroke [facility])    a. 03/2022 MRI Brain: subacute cortical infarct involving inf and med L occipial pole w/ petechial hemorrhage; b. 03/2022 s/p Abbott Assert IQ EL ILR.   Ventricular tachycardia (HCC)    a. 12/2022 noted on ILR.    Past Surgical History:  Procedure Laterality Date   LEFT HEART CATH AND CORONARY  ANGIOGRAPHY N/A 04/16/2022   Procedure: LEFT HEART CATH AND CORONARY ANGIOGRAPHY;  Surgeon: Lennette Bihari, MD;  Location: [facility];  Service: Cardiovascular;  Laterality: N/A;   LOOP RECORDER INSERTION N/A 04/15/2022   Procedure: LOOP RECORDER INSERTION;  Surgeon: Lanier Prude, MD;  Location: [facility] CV [facility];  Service: Cardiovascular;  Laterality: N/A;   MASS EXCISION Right 06/07/2019   Procedure: Dawson Bills OF KELOID TO RIGHT NECK;  Surgeon: Louisa Second, MD;  Location: [facility];  Service: Plastics;  Laterality: Right;   TOOTH EXTRACTION  2013     Medications Prior to Admission: Prior to Admission medications   Medication Sig Start Date End Date Taking? Authorizing Provider  atorvastatin (LIPITOR) 80 MG tablet Take 80 mg by mouth daily. 04/18/22   [provider]  clopidogrel (PLAVIX) 75 MG tablet Take 1 tablet (75 mg total) by mouth daily. 04/18/22   Meredeth Ide, MD  FARXIGA 10 MG TABS tablet Take 10 mg by mouth daily. 03/14/22   [provider]  gabapentin (NEURONTIN) 600 MG tablet Take 600 mg by mouth 2 (two) times daily. 08/17/22   [provider]  Linagliptin (TRADJENTA PO) Take 1 capsule by mouth daily.    [provider]  magnesium oxide (MAG-OX) 400 MG tablet Take 400 mg by mouth daily.    [provider]  metFORMIN (GLUCOPHAGE-XR) 500 MG 24 hr tablet Take 500 mg by mouth daily with breakfast. 03/14/22   [provider]  metoprolol succinate (TOPROL-XL) 50 MG 24 hr tablet Take 50 mg by mouth 2 (two) times daily. 07/31/22   [provider]  Multiple Vitamin (MULTIVITAMIN) tablet Take 1 tablet by mouth daily.    [provider]  OZEMPIC, 1 MG/DOSE, 4 MG/3ML SOPN Inject 1 mg into the skin once a week. 12/01/22   [provider]  pantoprazole (PROTONIX) 20 MG tablet Take 1 tablet (20 mg total) by mouth daily. 09/10/22   Pyrtle, Carie Caddy, MD  sacubitril-valsartan (ENTRESTO) 49-51  MG Take 1 tablet by mouth 2 (two) times daily. 09/06/22   Nahser, Deloris Ping, MD  spironolactone (ALDACTONE) 25 MG tablet Take 12.5 mg by mouth daily. 03/18/22   [provider]  TRESIBA FLEXTOUCH 200 UNIT/ML SOPN Take 80 Units by mouth at bedtime.  10/12/18   [provider]     Allergies:   No Known Allergies  Social History:   Social History   Socioeconomic History   Marital status: Single    Spouse name: Not on file   Number of children: Not on file   Years of education: Not on file   Highest education level: Not on file  Occupational History   Occupation: Retired  Tobacco Use   Smoking status: Former    Types: Cigarettes    Quit date: 08/20/2003    Years since quitting: 19.3   Smokeless tobacco: Never  Vaping Use   Vaping Use: Never used  Substance and Sexual Activity  Alcohol use: No   Drug use: No   Sexual activity: Not on file  Other Topics Concern   Not on file  Social History Narrative   Not on file   Social Determinants of Health   Financial Resource Strain: Not on file  Food Insecurity: No Food Insecurity (12/30/2022)   Hunger Vital Sign    Worried About Running Out of Food in the Last Year: Never true    Ran Out of Food in the Last Year: Never true  Transportation Needs: No Transportation Needs (12/30/2022)   PRAPARE - Administrator, Civil Service (Medical): No    Lack of Transportation (Non-Medical): No  Physical Activity: Not on file  Stress: Not on file  Social Connections: Not on file  Intimate Partner Violence: Not At Risk (12/30/2022)   Humiliation, Afraid, Rape, and Kick questionnaire    Fear of Current or Ex-Partner: No    Emotionally Abused: No    Physically Abused: No    Sexually Abused: No    Family History:   The patient's family history includes Cancer in her sister; Colon cancer (age of onset: 37) in her mother; Heart attack in her brother and father. There is no history of Stomach cancer, Pancreatic cancer, or  Esophageal cancer.    ROS:  Please see the history of present illness.  All other ROS reviewed and negative.     Physical Exam/Data:   Vitals:   12/30/22 1533 12/30/22 1542 12/30/22 1745 12/30/22 1900  BP: (!) 103/91  123/85   Pulse: 89  89   Resp: 16  20   Temp: 98.1 F (36.7 C)     TempSrc: Oral     SpO2: 93%  95% 90%  Weight:  82.8 kg    Height:  5\' 5"  (1.651 m)     No intake or output data in the 24 hours ending 12/30/22 1918    12/30/2022    3:42 PM 12/10/2022   10:18 AM 09/06/2022   10:04 AM  Last 3 Weights  Weight (lbs) 182 lb 9 oz 182 lb 9.6 oz 183 lb 12.8 oz  Weight (kg) 82.81 kg 82.827 kg 83.371 kg     Body mass index is 30.38 kg/m.  General:  Well nourished, well developed, in no acute distress HEENT: norma Neck: no JVD Vascular: No carotid bruits; Distal pulses 2+ bilaterally   Cardiac:  normal S1, S2; RRR with occasional ectopy; no murmur, no gallops or pericardial rubs Lungs:  clear to auscultation bilaterally, no wheezing, rhonchi or rales  Abd: soft, nontender, no hepatomegaly  Ext: no edema Musculoskeletal:  No deformities, BUE and BLE strength normal and equal Skin: warm and dry  Neuro:  CNs 2-12 intact, no focal abnormalities noted Psych:  Normal affect    EKG:  The ECG that was done today was personally reviewed was personally reviewed and demonstrates this rhythm with occasional PACs, U waves in lead V1-V2, minor IVCD (QRS duration 100 ms, most closely resembling incomplete right bundle branch block), lateral ST segment depression and T wave inversion, prolonged QTc 500 ms.  There is a tiny Q wave and a hint of ST segment elevation in lead aVL. Other than the presence of QT prolongation, the ECG is very similar to the tracing from 12/10/2022  Relevant CV Studies:  Cardiac catheterization 04/16/2022:          Ost LM lesion is 40% stenosed.   Mid LM to Dist LM lesion is 30% stenosed.  Ost LAD to Prox LAD lesion is 40% stenosed.   Mid LAD-1  lesion is 80% stenosed.   Mid LAD-2 lesion is 30% stenosed.   RV Branch lesion is 70% stenosed.   Mid Cx lesion is 5% stenosed.   Significant multi vessel coronary calcification.   The left main has 40% ostial 30% distal stenosis; the LAD has 40% ostial to proximal stenosis followed by focal calcified 80% stenosis prior to the takeoff of a septal perforator and the second diagonal vessel with 30% mid stenosis; mild nonobstructive disease in the left circumflex vessel, and diffuse 70% stenosis in the marginal branch of the RCA.   LVEDP is elevated at 22 mmHg.   RECOMMENDATION: Patient has significant LV dysfunction out of proportion to her or significant LAD stenosis.  She has been without anginal symptomatology.  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  Consider addition of anti-ischemic medical therapy with 80% LAD stenosis.  Will ask colleagues to review.  If patient has increasing symptomatology consider PCI to LAD with possible shockwave or CSI atherectomy if needed.  Echocardiogram 12/06/2022:   1. Left ventricular ejection fraction, by estimation, is 25 to 30%. The  left ventricle has severely decreased function. The left ventricle  demonstrates global hypokinesis. The left ventricular internal cavity size  was moderately dilated. Left  ventricular diastolic parameters are indeterminate.   2. Right ventricular systolic function is normal. The right ventricular  size is normal.   3. The mitral valve is normal in structure. Mild to moderate mitral valve  regurgitation. No evidence of mitral stenosis.   4. The aortic valve is tricuspid. There is mild calcification of the  aortic valve. Aortic valve regurgitation is mild. Aortic valve sclerosis  is present, with no evidence of aortic valve stenosis.   5. The inferior vena cava is normal in size with greater than 50%  respiratory variability, suggesting right atrial pressure of 3 mmHg.     Laboratory Data:  High Sensitivity Troponin:  No results for input(s): "TROPONINIHS" in the last 720 hours.    Chemistry Recent Labs  Lab 12/30/22 1550  NA 142  K 3.6  CL 108  CO2 24  GLUCOSE 109*  BUN 7*  CREATININE 1.01*  CALCIUM 8.8*  GFRNONAA 57*  ANIONGAP 10    Recent Labs  Lab 12/30/22 1550  PROT 7.3  ALBUMIN 3.7  AST 15  ALT 10  ALKPHOS 54  BILITOT 1.2   Lipids No results for input(s): "CHOL", "TRIG", "HDL", "LABVLDL", "LDLCALC", "CHOLHDL" in the last 168 hours. Hematology Recent Labs  Lab 12/30/22 1550  WBC 5.6  RBC 5.22*  HGB 13.5  HCT 44.3  MCV 84.9  MCH 25.9*  MCHC 30.5  RDW 13.9  PLT 283   Thyroid No results for input(s): "TSH", "FREET4" in the last 168 hours. BNPNo results for input(s): "BNP", "PROBNP" in the last 168 hours.  DDimer No results for input(s): "DDIMER" in the last 168 hours.   Radiology/Studies:  DG Chest 2 View  Result Date: 12/30/2022 CLINICAL DATA:  Palpitations EXAM: CHEST - 2 VIEW COMPARISON:  CT done on 04/15/2022 FINDINGS: Transverse diameter of heart is increased. Central pulmonary vessels are prominent. There is an implantable cardiac monitoring device in left anterior chest wall. There is slight prominence of interstitial markings in the parahilar regions and lower lung fields. Small bilateral pleural effusions are seen. There is no pneumothorax. IMPRESSION: Cardiomegaly. Central pulmonary vessels are  prominent suggesting CHF. Small bilateral pleural effusions. Electronically Signed   By: Ernie Avena M.D.   On: 12/30/2022 16:10     Assessment and Plan:   Ventricular tachycardia: Appears to be polymorphic, consistent with ischemic substrate.  Also has a prolonged QTc, new from the previous EKG from April.   For the time being we will treat with intravenous amiodarone and plan to transition that to oral medication.  Continue beta-blocker as well.  EP consultation in a.m. Long QT: Magnesium level pending.  Not  truly hypokalemic, but  Potassium is in low normal range and will supplement it.  Ischemia appears to be the most likely driver for the long QT. CAD: Although she does not have angina pectoris, she does have exertional dyspnea.  I think we need to reconsider revascularization of the LAD artery since it may represent the substrate for her arrhythmia.  Probably will need to have a repeat cardiac catheterization, but would like to consult with our interventional partners first, since the heavy calcification was felt to be an impediment to revascularization. CHF: Severely reduced left ventricular systolic function. (NYHA functional class II-IIIA).  From a clinical standpoint she appears to be euvolemic.  The chest x-ray does show small bilateral pleural effusions and some degree of prominence of the central pulmonary arteries, but no true infiltrates to suggest pulmonary edema.  At the previous cardiac catheterization it was felt that the CAD cannot entirely explain her cardiomyopathy and that may be true.  Continue treatment with Arni/beta-blocker/aldosterone antagonist/SGLT2 inhibitor. 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 HLP: On current statin dose LDL is at target at 70.  Has a chronically low HDL in the 35-40 range.  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 DM 2: On appropriate agent such as SGLT2 inhibitor and GLP-1 agonist but also still requires insulin.  Controlled with most recent hemoglobin A1c 6.3% and nonfasting blood sugar 109.   Risk Assessment/Risk Scores:       New York Heart Association (NYHA) Functional Class NYHA Class II     Severity of Illness: The appropriate patient status for this patient is INPATIENT. Inpatient status is judged to be reasonable and necessary in order to provide the required intensity of service to ensure the patient's safety. The patient's presenting symptoms,  physical exam findings, and initial radiographic and laboratory data in the context of their chronic comorbidities is felt to place them at high risk for further clinical deterioration. Furthermore, it is not anticipated that the patient will be medically stable for discharge from the hospital within 2 midnights of admission.   * I certify that at the point of admission it is my clinical judgment that the patient will require inpatient hospital care spanning beyond 2 midnights from the point of admission due to high intensity of service, high risk for further deterioration and high frequency of surveillance required.*   For questions or updates, please contact [facility] Please consult [email or web address] for contact info under     Signed, Thurmon Fair, MD  12/30/2022 7:18 PM

## 2022-12-30 NOTE — ED Notes (Signed)
ED TO INPATIENT HANDOFF REPORT  ED Nurse Name and Phone #: [phone]  S Name/Age/Gender Paula Wong 76 y.o. female Room/Bed: 004C/004C  Code Status   Code Status: Full Code  Home/SNF/Other Home Patient oriented to: self, place, time, and situation Is this baseline? Yes   Triage Complete: Triage complete  Chief Complaint Ventricular tachycardia [facility]) [I47.20]  Triage Note Patient reports was called today due to an alert from her loop recording reading the follow and told to get to the hospital.  Patient reports she did not feel it and has not felt any different.     Following alert received from [employer] received for false AF, EGM shows SR with ectopy. Pause event from 5/11 @ 22:01, EGM shows sustained VT with self termination, followed by junctional beat - 3sec pause - PVC, sinus beat, another 3sec pause, ?junctional beat, return to SB/SR with ectopy.    Allergies No Known Allergies  Level of Care/Admitting Diagnosis ED Disposition     ED Disposition  Admit   Condition  --   Comment  Hospital Area: [facility] [[id number]]  Level of Care: Progressive [[id number]]  Admit to Progressive based on following criteria: CARDIOVASCULAR & THORACIC of moderate stability with acute coronary syndrome symptoms/low risk myocardial infarction/hypertensive urgency/arrhythmias/heart failure potentially compromising stability and stable post cardiovascular intervention patients.  May admit patient to [facility] or [facility] if equivalent level of care is available:: No  Covid Evaluation: Asymptomatic - no recent exposure (last 10 days) testing not required  Diagnosis: Ventricular tachycardia [facility]) [[id number]]  Admitting Physician: Thurmon Fair [[id number]]  Attending Physician: Thurmon Fair [[id number]]  Certification:: I certify this patient will need inpatient services for at least 2 midnights  Estimated Length of Stay: 3          B Medical/Surgery  History Past Medical History:  Diagnosis Date   Allergy    CAD (coronary artery disease)    a. 03/2022 Cath: LM 40ost, 70m, LAD 40ost/p, 18m, 33m, LCX 16m, RCA nl, RV branch 70-->Med rx.   Chronic HFrEF (heart failure with reduced ejection fraction) (HCC)    a. 11/2021 Echo: EF 20-25%; b. 03/2022 Echo: EF 20-25%; c. 11/2022 Echo: EF 25-30%, glob HK.   Diabetes (HCC)    Diverticulosis    GERD (gastroesophageal reflux disease)    Hiatal hernia    Hyperlipidemia    Hypertension    Implantable loop recorder present    a. 03/2022 s/p Abbott IQ EL+ (serial [id number]) ILR in setting of cryptogenic stroke.   Ischemic cardiomyopathy    a. 11/2021 Echo: EF 20-25%; b. 03/2022 Echo: EF 20-25%; c. 11/2022 Echo: EF 25-30%, glob HK, nl RV fxn, mild-mod MR, mild AI, AoV sclerosis.   Neuropathy    feet   Schatzki's ring    Stroke [facility])    a. 03/2022 MRI Brain: subacute cortical infarct involving inf and med L occipial pole w/ petechial hemorrhage; b. 03/2022 s/p Abbott Assert IQ EL ILR.   Ventricular tachycardia (HCC)    a. 12/2022 noted on ILR.   Past Surgical History:  Procedure Laterality Date   LEFT HEART CATH AND CORONARY ANGIOGRAPHY N/A 04/16/2022   Procedure: LEFT HEART CATH AND CORONARY ANGIOGRAPHY;  Surgeon: Lennette Bihari, MD;  Location: [facility] INVASIVE CV LAB;  Service: Cardiovascular;  Laterality: N/A;   LOOP RECORDER INSERTION N/A 04/15/2022   Procedure: LOOP RECORDER INSERTION;  Surgeon: Lanier Prude, MD;  Location: [facility] INVASIVE CV LAB;  Service: Cardiovascular;  Laterality: N/A;   MASS EXCISION Right 06/07/2019   Procedure: Dawson Bills OF KELOID TO RIGHT NECK;  Surgeon: Louisa Second, MD;  Location: [facility];  Service: Plastics;  Laterality: Right;   TOOTH EXTRACTION  2013     A IV Location/Drains/Wounds Patient Lines/Drains/Airways Status     Active Line/Drains/Airways     Name Placement date Placement time Site Days   Peripheral IV 04/13/22 20 G 1.88" Left Antecubital  04/13/22  0222  Antecubital  261   Peripheral IV 04/15/22 18 G 1.25" Right Antecubital 04/15/22  1140  Antecubital  259   Peripheral IV 12/30/22 20 G Left Antecubital 12/30/22  1640  Antecubital  less than 1   Incision (Closed) 04/16/22 Chest Left;Upper;Anterior 04/16/22  0930  -- 258   Wound / Incision (Open or Dehisced) 04/16/22 Incision - Open;Other (Comment) Wrist Lower;Right;Posterior 04/16/22  0929  Wrist  258            Intake/Output Last 24 hours No intake or output data in the 24 hours ending 12/30/22 2032  Labs/Imaging Results for orders placed or performed during the hospital encounter of 12/30/22 (from the past 48 hour(s))  Comprehensive metabolic panel     Status: Abnormal   Collection Time: 12/30/22  3:50 PM  Result Value Ref Range   Sodium 142 135 - 145 mmol/L   Potassium 3.6 3.5 - 5.1 mmol/L   Chloride 108 98 - 111 mmol/L   CO2 24 22 - 32 mmol/L   Glucose, Bld 109 (H) 70 - 99 mg/dL    Comment: Glucose reference range applies only to samples taken after fasting for at least 8 hours.   BUN 7 (L) 8 - 23 mg/dL   Creatinine, Ser 1.91 (H) 0.44 - 1.00 mg/dL   Calcium 8.8 (L) 8.9 - 10.3 mg/dL   Total Protein 7.3 6.5 - 8.1 g/dL   Albumin 3.7 3.5 - 5.0 g/dL   AST 15 15 - 41 U/L   ALT 10 0 - 44 U/L   Alkaline Phosphatase 54 38 - 126 U/L   Total Bilirubin 1.2 0.3 - 1.2 mg/dL   GFR, Estimated 57 (L) >60 mL/min    Comment: (NOTE) Calculated using the CKD-EPI Creatinine Equation (2021)    Anion gap 10 5 - 15    Comment: Performed at [facility], [address], [place], [place] [address]  CBC with Differential     Status: Abnormal   Collection Time: 12/30/22  3:50 PM  Result Value Ref Range   WBC 5.6 4.0 - 10.5 K/uL   RBC 5.22 (H) 3.87 - 5.11 MIL/uL   Hemoglobin 13.5 12.0 - 15.0 g/dL   HCT 56.2 13.0 - 86.5 %   MCV 84.9 80.0 - 100.0 fL   MCH 25.9 (L) 26.0 - 34.0 pg   MCHC 30.5 30.0 - 36.0 g/dL   RDW 78.4 69.6 - 29.5 %   Platelets 283 150 - 400 K/uL   nRBC  0.0 0.0 - 0.2 %   Neutrophils Relative % 57 %   Neutro Abs 3.2 1.7 - 7.7 K/uL   Lymphocytes Relative 29 %   Lymphs Abs 1.6 0.7 - 4.0 K/uL   Monocytes Relative 11 %   Monocytes Absolute 0.6 0.1 - 1.0 K/uL   Eosinophils Relative 2 %   Eosinophils Absolute 0.1 0.0 - 0.5 K/uL   Basophils Relative 1 %   Basophils Absolute 0.0 0.0 - 0.1 K/uL   Immature Granulocytes 0 %  Abs Immature Granulocytes 0.01 0.00 - 0.07 K/uL    Comment: Performed at [facility], [address] [place], [place] [address]   DG Chest 2 View  Result Date: 12/30/2022 CLINICAL DATA:  Palpitations EXAM: CHEST - 2 VIEW COMPARISON:  CT done on 04/15/2022 FINDINGS: Transverse diameter of heart is increased. Central pulmonary vessels are prominent. There is an implantable cardiac monitoring device in left anterior chest wall. There is slight prominence of interstitial markings in the parahilar regions and lower lung fields. Small bilateral pleural effusions are seen. There is no pneumothorax. IMPRESSION: Cardiomegaly. Central pulmonary vessels are prominent suggesting CHF. Small bilateral pleural effusions. Electronically Signed   By: Ernie Avena M.D.   On: 12/30/2022 16:10    Pending Labs Unresulted Labs (From admission, onward)     Start     Ordered   12/30/22 1941  Brain natriuretic peptide  Add-on,   AD        12/30/22 1940   12/30/22 1621  Magnesium  Add-on,   AD        12/30/22 1620   Signed and Held  CBC  (heparin)  Once,   R       Comments: Baseline for heparin therapy IF NOT ALREADY DRAWN.  Notify MD if PLT < 100 K.    Signed and Held   Signed and Held  Creatinine, serum  (heparin)  Once,   R       Comments: Baseline for heparin therapy IF NOT ALREADY DRAWN.    Signed and Held   Signed and Held  Basic metabolic panel  Tomorrow morning,   R        Signed and Held   Signed and Held  Lipid panel  Tomorrow morning,   R        Signed and Held   Signed and Held  CBC  Tomorrow morning,   R         Signed and Held            Vitals/Pain Today's Vitals   12/30/22 1542 12/30/22 1745 12/30/22 1900 12/30/22 2015  BP:  123/85  125/84  Pulse:  89  87  Resp:  20  (!) 21  Temp:    98.5 F (36.9 C)  TempSrc:    Oral  SpO2:  95% 90% 94%  Weight: 82.8 kg     Height: 5\' 5"  (1.651 m)     PainSc: 0-No pain       Isolation Precautions No active isolations  Medications Medications  amiodarone (NEXTERONE) 1.8 mg/mL load via infusion 150 mg (150 mg Intravenous Bolus from Bag 12/30/22 1655)    Followed by  amiodarone (NEXTERONE PREMIX) 360-4.14 MG/200ML-% (1.8 mg/mL) IV infusion (60 mg/hr Intravenous New Bag/Given 12/30/22 1659)    Followed by  amiodarone (NEXTERONE PREMIX) 360-4.14 MG/200ML-% (1.8 mg/mL) IV infusion (has no administration in time range)  potassium chloride SA (KLOR-CON M) CR tablet 40 mEq (40 mEq Oral Given 12/30/22 2012)  furosemide (LASIX) injection 40 mg (40 mg Intravenous Given 12/30/22 2012)    Mobility walks     Focused Assessments    R Recommendations: See Admitting Provider Note  Report given to:   Additional Notes: Patient is having some increased shortness of breath at the moment I have her on 4L and she is stating in the 90s now. She is sitting on the side of the bed because she states laying down makes it worse and that she feels  better sitting up.

## 2022-12-30 NOTE — ED Provider Notes (Signed)
[employer] EMERGENCY DEPARTMENT AT [facility] Provider Note   CSN: [id number] Arrival date & time: 12/30/22  1517     History  Chief Complaint  Patient presents with   Palpitations    Paula Wong is a 77 y.o. female history of previous stroke with a loop recorder, hypertension, here presenting with possible ventricular tachycardia.  Patient apparently was watching TV on Saturday and had some palpitations.  The loop recorder transmitted today and it showed sustained V. tach with self termination and also with 3-second pause.  Patient also has frequent ectopy as well.  Electrophysiologist, Dr. Lalla Brothers was notified and then requested that patient come to the ER.  Denies any chest pain.  Patient does have palpitations for several days  The history is provided by the patient.       Home Medications Prior to Admission medications   Medication Sig Start Date End Date Taking? Authorizing Provider  atorvastatin (LIPITOR) 80 MG tablet Take 80 mg by mouth daily. 04/18/22   [provider]  clopidogrel (PLAVIX) 75 MG tablet Take 1 tablet (75 mg total) by mouth daily. 04/18/22   Meredeth Ide, MD  FARXIGA 10 MG TABS tablet Take 10 mg by mouth daily. 03/14/22   [provider]  gabapentin (NEURONTIN) 600 MG tablet Take 600 mg by mouth 2 (two) times daily. 08/17/22   [provider]  Linagliptin (TRADJENTA PO) Take 1 capsule by mouth daily.    [provider]  magnesium oxide (MAG-OX) 400 MG tablet Take 400 mg by mouth daily.    [provider]  metFORMIN (GLUCOPHAGE-XR) 500 MG 24 hr tablet Take 500 mg by mouth daily with breakfast. 03/14/22   [provider]  metoprolol succinate (TOPROL-XL) 50 MG 24 hr tablet Take 50 mg by mouth 2 (two) times daily. 07/31/22   [provider]  Multiple Vitamin (MULTIVITAMIN) tablet Take 1 tablet by mouth daily.    [provider]  OZEMPIC, 1 MG/DOSE, 4 MG/3ML SOPN Inject 1 mg  into the skin once a week. 12/01/22   [provider]  pantoprazole (PROTONIX) 20 MG tablet Take 1 tablet (20 mg total) by mouth daily. 09/10/22   Pyrtle, Carie Caddy, MD  sacubitril-valsartan (ENTRESTO) 49-51 MG Take 1 tablet by mouth 2 (two) times daily. 09/06/22   Nahser, Deloris Ping, MD  spironolactone (ALDACTONE) 25 MG tablet Take 12.5 mg by mouth daily. 03/18/22   [provider]  TRESIBA FLEXTOUCH 200 UNIT/ML SOPN Take 80 Units by mouth at bedtime.  10/12/18   [provider]      Allergies    Patient has no known allergies.    Review of Systems   Review of Systems  Cardiovascular:  Positive for palpitations.  All other systems reviewed and are negative.   Physical Exam Updated Vital Signs BP 123/85   Pulse 89   Temp 98.1 F (36.7 C) (Oral)   Resp 20   Ht 5\' 5"  (1.651 m)   Wt 82.8 kg   SpO2 90%   BMI 30.38 kg/m  Physical Exam Vitals and nursing note reviewed.  Constitutional:      Appearance: Normal appearance.  HENT:     Head: Normocephalic.     Mouth/Throat:     Mouth: Mucous membranes are moist.  Eyes:     Extraocular Movements: Extraocular movements intact.     Pupils: Pupils are equal, round, and reactive to light.  Cardiovascular:     Rate and Rhythm: Normal  rate. Rhythm irregular.     Pulses: Normal pulses.     Heart sounds: Normal heart sounds.     Comments: Occasional PVCs Pulmonary:     Effort: Pulmonary effort is normal.     Breath sounds: Normal breath sounds.  Abdominal:     General: Abdomen is flat.     Palpations: Abdomen is soft.  Musculoskeletal:        General: Normal range of motion.     Cervical back: Normal range of motion and neck supple.  Skin:    General: Skin is warm.     Capillary Refill: Capillary refill takes less than 2 seconds.  Neurological:     General: No focal deficit present.     Mental Status: She is alert and oriented to person, place, and time.  Psychiatric:        Mood and Affect: Mood normal.         Behavior: Behavior normal.     ED Results / Procedures / Treatments   Labs (all labs ordered are listed, but only abnormal results are displayed) Labs Reviewed  COMPREHENSIVE METABOLIC PANEL - Abnormal; Notable for the following components:      Result Value   Glucose, Bld 109 (*)    BUN 7 (*)    Creatinine, Ser 1.01 (*)    Calcium 8.8 (*)    GFR, Estimated 57 (*)    All other components within normal limits  CBC WITH DIFFERENTIAL/PLATELET - Abnormal; Notable for the following components:   RBC 5.22 (*)    MCH 25.9 (*)    All other components within normal limits  MAGNESIUM  BRAIN NATRIURETIC PEPTIDE  TROPONIN I (HIGH SENSITIVITY)    EKG EKG Interpretation  Date/Time:  Monday Dec 30 2022 15:26:20 EDT Ventricular Rate:  99 PR Interval:  168 QRS Duration: 108 QT Interval:  390 QTC Calculation: 500 R Axis:   -13 Text Interpretation: Sinus rhythm with Premature atrial complexes with Abberant conduction Incomplete right bundle branch block Left ventricular hypertrophy with repolarization abnormality ( Cornell product ) Cannot rule out Septal infarct , age undetermined Abnormal ECG When compared with ECG of 17-Apr-2022 06:52, PREVIOUS ECG IS PRESENT Confirmed by Richardean Canal [phone]) on 12/30/2022 4:20:08 PM  Radiology DG Chest 2 View  Result Date: 12/30/2022 CLINICAL DATA:  Palpitations EXAM: CHEST - 2 VIEW COMPARISON:  CT done on 04/15/2022 FINDINGS: Transverse diameter of heart is increased. Central pulmonary vessels are prominent. There is an implantable cardiac monitoring device in left anterior chest wall. There is slight prominence of interstitial markings in the parahilar regions and lower lung fields. Small bilateral pleural effusions are seen. There is no pneumothorax. IMPRESSION: Cardiomegaly. Central pulmonary vessels are prominent suggesting CHF. Small bilateral pleural effusions. Electronically Signed   By: Ernie Avena M.D.   On: 12/30/2022 16:10     Procedures Procedures     CRITICAL CARE Performed by: Richardean Canal   Total critical care time: 37 minutes  Critical care time was exclusive of separately billable procedures and treating other patients.  Critical care was necessary to treat or prevent imminent or life-threatening deterioration.  Critical care was time spent personally by me on the following activities: development of treatment plan with patient and/or surrogate as well as nursing, discussions with consultants, evaluation of patient's response to treatment, examination of patient, obtaining history from patient or surrogate, ordering and performing treatments and interventions, ordering and review of laboratory studies, ordering and review of radiographic studies,  pulse oximetry and re-evaluation of patient's condition.  Medications Ordered in ED Medications  amiodarone (NEXTERONE) 1.8 mg/mL load via infusion 150 mg (150 mg Intravenous Bolus from Bag 12/30/22 1655)    Followed by  amiodarone (NEXTERONE PREMIX) 360-4.14 MG/200ML-% (1.8 mg/mL) IV infusion (60 mg/hr Intravenous New Bag/Given 12/30/22 1659)    Followed by  amiodarone (NEXTERONE PREMIX) 360-4.14 MG/200ML-% (1.8 mg/mL) IV infusion (has no administration in time range)    ED Course/ Medical Decision Making/ A&P                             Medical Decision Making Paula Wong is a 77 y.o. female here presenting with possible V. tach.  Patient goes into bigeminy and nonsustained V. tach in the ED.  I consulted cardiology and they will see the patient.  7 pm I reviewed patient's labs and chest x-ray.  Labs unremarkable and chest x-ray shows some vascular congestion.  Patient was started on amiodarone drip.  At this point, cardiology will admit   Problems Addressed: Ventricular tachycardia [facility]): acute illness or injury  Amount and/or Complexity of Data Reviewed Labs: ordered. Decision-making details documented in ED Course. Radiology: ordered and  independent interpretation performed. Decision-making details documented in ED Course. ECG/medicine tests: ordered and independent interpretation performed. Decision-making details documented in ED Course.  Risk Prescription drug management. Decision regarding hospitalization.    Final Clinical Impression(s) / ED Diagnoses Final diagnoses:  None    Rx / DC Orders ED Discharge Orders     None         Charlynne Pander, MD 12/30/22 [phone]

## 2022-12-30 NOTE — Telephone Encounter (Signed)
Following alert received from [employer] received for false AF, EGM shows SR with ectopy. Pause event from 5/11 @ 22:01, EGM shows sustained VT with self termination, followed by junctional beat - 3sec pause - PVC, sinus beat, another 3sec pause, ?junctional beat, return to SB/SR with ectopy.  Patient reports she was watching tv and reports she was "dozing" watching tv. States she was going to sleep during this time. Denies any symptoms or complaints. Compliance with meds. Reviewed with Arita Miss., PA in office. Possbile VT. Message sent to Dr Lalla Brothers requesting review and recommendation.   Pt updated to plan.

## 2022-12-30 NOTE — ED Triage Notes (Signed)
Patient reports was called today due to an alert from her loop recording reading the follow and told to get to the hospital.  Patient reports she did not feel it and has not felt any different.     Following alert received from [employer] received for false AF, EGM shows SR with ectopy. Pause event from 5/11 @ 22:01, EGM shows sustained VT with self termination, followed by junctional beat - 3sec pause - PVC, sinus beat, another 3sec pause, ?junctional beat, return to SB/SR with ectopy.

## 2022-12-30 NOTE — Telephone Encounter (Signed)
Spoke to Dr. Lalla Brothers, patient needs to go to [facility] ER for admission for ischemic eval and coronary revascularization.   Patient called, advised to go to [facility] ED per Dr. Lalla Brothers due to information seen on loop recorder. Also informed patient to plan to be admitted. Patient was anxious about going but does agree and I stressed the importance and discussed if she does not, she could have another life threatening heart arrhythmia. Patient voiced understanding and states she will go.

## 2022-12-31 ENCOUNTER — Inpatient Hospital Stay (HOSPITAL_COMMUNITY)
Admission: EM | Disposition: A | Discharge status: Home / Self Care | Payer: Self-pay | Source: Home / Self Care | Attending: Cardiovascular Disease

## 2022-12-31 DIAGNOSIS — I25118 Atherosclerotic heart disease of native coronary artery with other forms of angina pectoris: Secondary | ICD-10-CM

## 2022-12-31 DIAGNOSIS — I472 Ventricular tachycardia, unspecified: Secondary | ICD-10-CM | POA: Diagnosis not present

## 2022-12-31 HISTORY — PX: CORONARY STENT INTERVENTION: CATH118234

## 2022-12-31 HISTORY — PX: CORONARY LITHOTRIPSY: CATH118330

## 2022-12-31 HISTORY — PX: LEFT HEART CATH AND CORONARY ANGIOGRAPHY: CATH118249

## 2022-12-31 HISTORY — PX: CORONARY ULTRASOUND/IVUS: CATH118244

## 2022-12-31 LAB — GLUCOSE, CAPILLARY
Glucose-Capillary: 132 mg/dL — ABNORMAL HIGH (ref 70–99)
Glucose-Capillary: 105 mg/dL — ABNORMAL HIGH (ref 70–99)
Glucose-Capillary: 79 mg/dL (ref 70–99)
Glucose-Capillary: 150 mg/dL — ABNORMAL HIGH (ref 70–99)
Glucose-Capillary: 189 mg/dL — ABNORMAL HIGH (ref 70–99)

## 2022-12-31 LAB — MAGNESIUM: Magnesium: 1.8 mg/dL (ref 1.7–2.4)

## 2022-12-31 LAB — TROPONIN I (HIGH SENSITIVITY): Troponin I (High Sensitivity): 12 ng/L (ref <18)

## 2022-12-31 LAB — CBC
HCT: 43.9 % (ref 36.0–46.0)
HCT: 44.6 % (ref 36.0–46.0)
Hemoglobin: 13.8 g/dL (ref 12.0–15.0)
Hemoglobin: 13.9 g/dL (ref 12.0–15.0)
MCH: 25.8 pg — ABNORMAL LOW (ref 26.0–34.0)
MCH: 26.2 pg (ref 26.0–34.0)
MCHC: 31.4 g/dL (ref 30.0–36.0)
MCHC: 31.2 g/dL (ref 30.0–36.0)
MCV: 82.2 fL (ref 80.0–100.0)
MCV: 84.2 fL (ref 80.0–100.0)
Platelets: 274 10*3/uL (ref 150–400)
Platelets: 269 10*3/uL (ref 150–400)
RBC: 5.34 MIL/uL — ABNORMAL HIGH (ref 3.87–5.11)
RBC: 5.3 MIL/uL — ABNORMAL HIGH (ref 3.87–5.11)
RDW: 14 % (ref 11.5–15.5)
RDW: 14.2 % (ref 11.5–15.5)
WBC: 7.6 10*3/uL (ref 4.0–10.5)
WBC: 7.9 10*3/uL (ref 4.0–10.5)
nRBC: 0 % (ref 0.0–0.2)
nRBC: 0 % (ref 0.0–0.2)

## 2022-12-31 LAB — LIPID PANEL
Cholesterol: 100 mg/dL (ref 0–200)
HDL: 39 mg/dL — ABNORMAL LOW (ref >40)
LDL Cholesterol: 41 mg/dL (ref 0–99)
Total CHOL/HDL Ratio: 2.6 RATIO
Triglycerides: 101 mg/dL (ref <150)
VLDL: 20 mg/dL (ref 0–40)

## 2022-12-31 LAB — BASIC METABOLIC PANEL
Anion gap: 12 (ref 5–15)
BUN: 8 mg/dL (ref 8–23)
CO2: 24 mmol/L (ref 22–32)
Calcium: 8.8 mg/dL — ABNORMAL LOW (ref 8.9–10.3)
Chloride: 106 mmol/L (ref 98–111)
Creatinine, Ser: 0.92 mg/dL (ref 0.44–1.00)
GFR, Estimated: >60 mL/min (ref >60)
Glucose, Bld: 172 mg/dL — ABNORMAL HIGH (ref 70–99)
Potassium: 4 mmol/L (ref 3.5–5.1)
Sodium: 142 mmol/L (ref 135–145)

## 2022-12-31 LAB — CREATININE, SERUM
Creatinine, Ser: 0.98 mg/dL (ref 0.44–1.00)
GFR, Estimated: 59 mL/min — ABNORMAL LOW (ref >60)

## 2022-12-31 LAB — MRSA NEXT GEN BY PCR, NASAL: MRSA by PCR Next Gen: NOT DETECTED

## 2022-12-31 LAB — POCT ACTIVATED CLOTTING TIME
Activated Clotting Time: 358 seconds
Activated Clotting Time: 260 seconds

## 2022-12-31 SURGERY — LEFT HEART CATH AND CORONARY ANGIOGRAPHY
Anesthesia: LOCAL

## 2022-12-31 MED ORDER — SODIUM CHLORIDE 0.9 % IV SOLN: 250.0000 mL | INTRAVENOUS | Status: DC | PRN

## 2022-12-31 MED ORDER — SODIUM CHLORIDE 0.9% FLUSH: 3.0000 mL | INTRAVENOUS | Status: DC | PRN

## 2022-12-31 MED ORDER — HYDRALAZINE HCL 20 MG/ML IJ SOLN: 10.0000 mg | INTRAMUSCULAR | Status: AC | PRN

## 2022-12-31 MED ORDER — IOHEXOL 350 MG/ML SOLN
INTRAVENOUS | Status: DC | PRN
  Administered 2022-12-31: 115 mL

## 2022-12-31 MED ORDER — NITROGLYCERIN 1 MG/10 ML FOR IR/CATH LAB
INTRA_ARTERIAL | Status: DC | PRN
  Administered 2022-12-31: 200 ug via INTRACORONARY

## 2022-12-31 MED ORDER — HEPARIN SODIUM (PORCINE) 1000 UNIT/ML IJ SOLN
INTRAMUSCULAR | Status: DC | PRN
  Administered 2022-12-31 (×2): 4000 [IU] via INTRAVENOUS
  Administered 2022-12-31: 3000 [IU] via INTRAVENOUS

## 2022-12-31 MED ORDER — LIDOCAINE HCL (PF) 1 % IJ SOLN
INTRAMUSCULAR | Status: DC | PRN
  Administered 2022-12-31: 2 mL via INTRADERMAL

## 2022-12-31 MED ORDER — ENOXAPARIN SODIUM 40 MG/0.4ML IJ SOSY
40.0000 mg | PREFILLED_SYRINGE | INTRAMUSCULAR | Status: DC
  Administered 2023-01-01 – 2023-01-02 (×2): 40 mg via SUBCUTANEOUS
  Filled 2022-12-31 (×2): qty 0.4

## 2022-12-31 MED ORDER — HEPARIN SODIUM (PORCINE) 1000 UNIT/ML IJ SOLN
INTRAMUSCULAR | Status: AC
  Filled 2022-12-31: qty 10

## 2022-12-31 MED ORDER — LABETALOL HCL 5 MG/ML IV SOLN: 10.0000 mg | INTRAVENOUS | Status: AC | PRN

## 2022-12-31 MED ORDER — ASPIRIN 81 MG PO CHEW
81.0000 mg | CHEWABLE_TABLET | Freq: Every day | ORAL | Status: DC
  Administered 2023-01-01 – 2023-01-02 (×2): 81 mg via ORAL
  Filled 2022-12-31 (×2): qty 1

## 2022-12-31 MED ORDER — MIDAZOLAM HCL 2 MG/2ML IJ SOLN
INTRAMUSCULAR | Status: AC
  Filled 2022-12-31: qty 2

## 2022-12-31 MED ORDER — FENTANYL CITRATE (PF) 100 MCG/2ML IJ SOLN
INTRAMUSCULAR | Status: DC | PRN
  Administered 2022-12-31 (×2): 25 ug via INTRAVENOUS

## 2022-12-31 MED ORDER — LIDOCAINE HCL (PF) 1 % IJ SOLN
INTRAMUSCULAR | Status: AC
  Filled 2022-12-31: qty 30

## 2022-12-31 MED ORDER — SODIUM CHLORIDE 0.9 % IV SOLN: INTRAVENOUS | Status: DC

## 2022-12-31 MED ORDER — FENTANYL CITRATE (PF) 100 MCG/2ML IJ SOLN
INTRAMUSCULAR | Status: AC
  Filled 2022-12-31: qty 2

## 2022-12-31 MED ORDER — MIDAZOLAM HCL 2 MG/2ML IJ SOLN
INTRAMUSCULAR | Status: DC | PRN
  Administered 2022-12-31 (×2): 1 mg via INTRAVENOUS

## 2022-12-31 MED ORDER — SODIUM CHLORIDE 0.9% FLUSH: 3.0000 mL | Freq: Two times a day (BID) | INTRAVENOUS | Status: DC

## 2022-12-31 MED ORDER — ASPIRIN 81 MG PO CHEW: 81.0000 mg | CHEWABLE_TABLET | ORAL | Status: DC

## 2022-12-31 MED ORDER — HEPARIN (PORCINE) IN NACL 1000-0.9 UT/500ML-% IV SOLN
INTRAVENOUS | Status: DC | PRN
  Administered 2022-12-31 (×2): 500 mL

## 2022-12-31 MED ORDER — NITROGLYCERIN 1 MG/10 ML FOR IR/CATH LAB
INTRA_ARTERIAL | Status: AC
  Filled 2022-12-31: qty 10

## 2022-12-31 MED ORDER — VERAPAMIL HCL 2.5 MG/ML IV SOLN
INTRAVENOUS | Status: AC
  Filled 2022-12-31: qty 2

## 2022-12-31 SURGICAL SUPPLY — 24 items
BALL SAPPHIRE NC24 3.25X15 (BALLOONS) ×1
BALLN EMERGE MR 2.5X12 (BALLOONS) ×1
BALLOON EMERGE MR 2.5X12 (BALLOONS) IMPLANT
BALLOON SAPPHIRE NC24 3.25X15 (BALLOONS) IMPLANT
BAND CMPR LRG ZPHR (HEMOSTASIS) ×1
BAND ZEPHYR COMPRESS 30 LONG (HEMOSTASIS) IMPLANT
CATH 5FR JL3.5 JR4 ANG PIG MP (CATHETERS) IMPLANT
CATH GUIDELINER COAST (CATHETERS) IMPLANT
CATH OPTICROSS HD (CATHETERS) IMPLANT
CATH SHOCKWAVE C2 3.0X12 (CATHETERS) IMPLANT
CATH VISTA GUIDE 6FR XBLAD3.5 (CATHETERS) IMPLANT
GLIDESHEATH SLEND SS 6F .021 (SHEATH) IMPLANT
GUIDEWIRE INQWIRE 1.5J.035X260 (WIRE) IMPLANT
INQWIRE 1.5J .035X260CM (WIRE) ×1
KIT ENCORE 26 ADVANTAGE (KITS) IMPLANT
KIT HEART LEFT (KITS) ×2 IMPLANT
PACK CARDIAC CATHETERIZATION (CUSTOM PROCEDURE TRAY) ×2 IMPLANT
SLED PULL BACK IVUS (MISCELLANEOUS) IMPLANT
STENT SYNERGY XD 3.0X24 (Permanent Stent) IMPLANT
SYNERGY XD 3.0X24 (Permanent Stent) ×1 IMPLANT
SYR MEDRAD MARK 7 150ML (SYRINGE) ×2 IMPLANT
TRANSDUCER W/STOPCOCK (MISCELLANEOUS) ×2 IMPLANT
TUBING CIL FLEX 10 FLL-RA (TUBING) ×2 IMPLANT
WIRE ASAHI PROWATER 180CM (WIRE) IMPLANT

## 2022-12-31 NOTE — Interval H&P Note (Signed)
History and Physical Interval Note:  12/31/2022 2:31 PM  Paula Wong  has presented today for surgery, with the diagnosis of unstable angina.  The various methods of treatment have been discussed with the patient and family. After consideration of risks, benefits and other options for treatment, the patient has consented to  Procedure(s): LEFT HEART CATH AND CORONARY ANGIOGRAPHY (N/A) as a surgical intervention.  The patient's history has been reviewed, patient examined, no change in status, stable for surgery.  I have reviewed the patient's chart and labs.  Questions were answered to the patient's satisfaction.   Cath Lab Visit (complete for each Cath Lab visit)  Clinical Evaluation Leading to the Procedure:   ACS: No.  Non-ACS:    Anginal Classification: CCS II  Anti-ischemic medical therapy: Maximal Therapy (2 or more classes of medications)  Non-Invasive Test Results: No non-invasive testing performed  Prior CABG: No previous CABG        Thedora Hinders, [facility] 12/31/2022 2:31 PM

## 2022-12-31 NOTE — Progress Notes (Signed)
 Rounding Note    Patient Name: Paula Wong Date of Encounter: 12/31/2022  [facility] Cardiologist: Philip Nahser, MD   Subjective   Feels well. Denies any chest pain or dyspnea. Was unaware of arrhythmia  Inpatient Medications    Scheduled Meds:  atorvastatin  80 mg Oral Daily   clopidogrel  75 mg Oral Daily   dapagliflozin propanediol  10 mg Oral Daily   gabapentin  600 mg Oral BID   heparin  5,000 Units Subcutaneous Q8H   insulin glargine-yfgn  40 Units Subcutaneous QHS   linagliptin  5 mg Oral Daily   magnesium oxide  400 mg Oral Daily   metoprolol succinate  50 mg Oral BID   multivitamin with minerals  1 tablet Oral Daily   pantoprazole  20 mg Oral Daily   sacubitril-valsartan  1 tablet Oral BID   sodium chloride flush  3 mL Intravenous Q12H   spironolactone  12.5 mg Oral Daily   Continuous Infusions:  sodium chloride     sodium chloride 10 mL/hr at 12/31/22 0518   amiodarone 30 mg/hr (12/31/22 0640)   PRN Meds: sodium chloride, acetaminophen, nitroGLYCERIN, ondansetron (ZOFRAN) IV, mouth rinse, sodium chloride flush   Vital Signs    Vitals:   12/30/22 2157 12/31/22 0159 12/31/22 0346 12/31/22 0642  BP: 117/88 119/85  114/87  Pulse: 88 79  78  Resp: 20 20  18  Temp: 97.8 F (36.6 C) 97.9 F (36.6 C)  97.7 F (36.5 C)  TempSrc: Oral Oral  Oral  SpO2: 98% 95%  94%  Weight: 82.4 kg  81.7 kg   Height: 5' 5" (1.651 m)       Intake/Output Summary (Last 24 hours) at 12/31/2022 1021 Last data filed at 12/31/2022 0600 Gross per 24 hour  Intake 619.38 ml  Output 800 ml  Net -180.62 ml      12/31/2022    3:46 AM 12/30/2022    9:57 PM 12/30/2022    3:42 PM  Last 3 Weights  Weight (lbs) 180 lb 3.2 oz 181 lb 11.2 oz 182 lb 9 oz  Weight (kg) 81.738 kg 82.419 kg 82.81 kg      Telemetry    NSR. Brief run of NSVT - Personally Reviewed  ECG    NSR with slight ST elevation AVL and V1-2, increased ST depression in inferolateral leads -  Personally Reviewed  Physical Exam   GEN: No acute distress.   Neck: No JVD Cardiac: RRR, no murmurs, rubs, or gallops.  Respiratory: Clear to auscultation bilaterally. GI: Soft, nontender, non-distended  MS: No edema; No deformity. Neuro:  Nonfocal  Psych: Normal affect   Labs    High Sensitivity Troponin:   Recent Labs  Lab 12/30/22 2236 12/31/22 0053  TROPONINIHS 10 12     Chemistry Recent Labs  Lab 12/30/22 1550 12/30/22 2236 12/31/22 0053  NA 142  --  142  K 3.6  --  4.0  CL 108  --  106  CO2 24  --  24  GLUCOSE 109*  --  172*  BUN 7*  --  8  CREATININE 1.01* 0.96 0.92  CALCIUM 8.8*  --  8.8*  MG  --   --  1.8  PROT 7.3  --   --   ALBUMIN 3.7  --   --   AST 15  --   --   ALT 10  --   --   ALKPHOS 54  --   --     BILITOT 1.2  --   --   GFRNONAA 57* >60 >60  ANIONGAP 10  --  12    Lipids  Recent Labs  Lab 12/31/22 0053  CHOL 100  TRIG 101  HDL 39*  LDLCALC 41  CHOLHDL 2.6    Hematology Recent Labs  Lab 12/30/22 1550 12/30/22 2236 12/31/22 0053  WBC 5.6 7.3 7.6  RBC 5.22* 5.63* 5.34*  HGB 13.5 14.7 13.8  HCT 44.3 47.0* 43.9  MCV 84.9 83.5 82.2  MCH 25.9* 26.1 25.8*  MCHC 30.5 31.3 31.4  RDW 13.9 14.0 14.0  PLT 283 276 274   Thyroid No results for input(s): "TSH", "FREET4" in the last 168 hours.  BNP Recent Labs  Lab 12/30/22 2236  BNP 1,847.2*    DDimer No results for input(s): "DDIMER" in the last 168 hours.   Radiology    DG Chest 2 View  Result Date: 12/30/2022 CLINICAL DATA:  Palpitations EXAM: CHEST - 2 VIEW COMPARISON:  CT done on 04/15/2022 FINDINGS: Transverse diameter of heart is increased. Central pulmonary vessels are prominent. There is an implantable cardiac monitoring device in left anterior chest wall. There is slight prominence of interstitial markings in the parahilar regions and lower lung fields. Small bilateral pleural effusions are seen. There is no pneumothorax. IMPRESSION: Cardiomegaly. Central pulmonary  vessels are prominent suggesting CHF. Small bilateral pleural effusions. Electronically Signed   By: Palani  Rathinasamy M.D.   On: 12/30/2022 16:10    Cardiac Studies   Echo: 12/06/22: IMPRESSIONS     1. Left ventricular ejection fraction, by estimation, is 25 to 30%. The  left ventricle has severely decreased function. The left ventricle  demonstrates global hypokinesis. The left ventricular internal cavity size  was moderately dilated. Left  ventricular diastolic parameters are indeterminate.   2. Right ventricular systolic function is normal. The right ventricular  size is normal.   3. The mitral valve is normal in structure. Mild to moderate mitral valve  regurgitation. No evidence of mitral stenosis.   4. The aortic valve is tricuspid. There is mild calcification of the  aortic valve. Aortic valve regurgitation is mild. Aortic valve sclerosis  is present, with no evidence of aortic valve stenosis.   5. The inferior vena cava is normal in size with greater than 50%  respiratory variability, suggesting right atrial pressure of 3 mmHg.    Patient Profile     77 y.o. female  with CAD and heart failure with reduced ejection fraction who is being seen 12/30/2022 for the evaluation of sustained ventricular tachycardia.   Assessment & Plan    Ventricular tachycardia: Appears to be polymorphic, consistent with ischemic substrate.  Also has a prolonged QTc, new from the previous EKG from April.   For the time being we will treat with intravenous amiodarone and plan to transition that to oral medication.  Continue beta-blocker as well.  EP on board. Given VT should consider revascularization of LAD providing anatomy is stable from August.  Long QT: Magnesium level pending.  Not truly hypokalemic, but  Potassium is in low normal range and will supplement it.  Ischemia appears to be the most likely driver for the long QT. CAD: Although she does not have angina pectoris, she does have exertional  dyspnea.  I personally reviewed her cath films from last August. She has severe stenosis in the mid LAD with heavy calcification. The RCA is small and diffusely diseased. I think it is appropriate to consider PCI of the LAD.   Will need plaque modification given heavy calcification with either intracoronary lithotripsy or atherectomy. Will start ASA in addition to plavix. The procedure and risks were reviewed including but not limited to death, myocardial infarction, stroke, arrythmias, bleeding, transfusion, emergency surgery, dye allergy, or renal dysfunction. The patient voices understanding and is agreeable to proceed. CHF: Severely reduced left ventricular systolic function. (NYHA functional class II-IIIA).  From a clinical standpoint she appears to be euvolemic.  The chest x-ray does show small bilateral pleural effusions and some degree of prominence of the central pulmonary arteries, but no true infiltrates to suggest pulmonary edema.  At the previous cardiac catheterization it was felt that the CAD cannot entirely explain her cardiomyopathy and that may be true.  Continue treatment with Arni/beta-blocker/aldosterone antagonist/SGLT2 inhibitor. 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 HLP: On current statin dose LDL is at target at 70.  Has a chronically low HDL in the 35-40 range.  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 DM 2: On appropriate agent such as SGLT2 inhibitor and GLP-1 agonist but also still requires insulin.  Controlled with most recent hemoglobin A1c 6.3% and nonfasting blood sugar 109.  For questions or updates, please contact [facility] Please consult [email or web address] for contact info under        Signed, Thelmer Legler, MD  12/31/2022, 10:21 AM    

## 2022-12-31 NOTE — Progress Notes (Signed)
Patient taken off the floor to cath lab at this time. Was informed by provider to send patient with amiodarone drip.

## 2022-12-31 NOTE — H&P (View-Only) (Signed)
Rounding Note    Patient Name: Paula Wong Date of Encounter: 12/31/2022  [facility] Cardiologist: Kristeen Miss, MD   Subjective   Feels well. Denies any chest pain or dyspnea. Was unaware of arrhythmia  Inpatient Medications    Scheduled Meds:  atorvastatin  80 mg Oral Daily   clopidogrel  75 mg Oral Daily   dapagliflozin propanediol  10 mg Oral Daily   gabapentin  600 mg Oral BID   heparin  5,000 Units Subcutaneous Q8H   insulin glargine-yfgn  40 Units Subcutaneous QHS   linagliptin  5 mg Oral Daily   magnesium oxide  400 mg Oral Daily   metoprolol succinate  50 mg Oral BID   multivitamin with minerals  1 tablet Oral Daily   pantoprazole  20 mg Oral Daily   sacubitril-valsartan  1 tablet Oral BID   sodium chloride flush  3 mL Intravenous Q12H   spironolactone  12.5 mg Oral Daily   Continuous Infusions:  sodium chloride     sodium chloride 10 mL/hr at 12/31/22 0518   amiodarone 30 mg/hr (12/31/22 0640)   PRN Meds: sodium chloride, acetaminophen, nitroGLYCERIN, ondansetron (ZOFRAN) IV, mouth rinse, sodium chloride flush   Vital Signs    Vitals:   12/30/22 2157 12/31/22 0159 12/31/22 0346 12/31/22 0642  BP: 117/88 119/85  114/87  Pulse: 88 79  78  Resp: 20 20  18   Temp: 97.8 F (36.6 C) 97.9 F (36.6 C)  97.7 F (36.5 C)  TempSrc: Oral Oral  Oral  SpO2: 98% 95%  94%  Weight: 82.4 kg  81.7 kg   Height: 5\' 5"  (1.651 m)       Intake/Output Summary (Last 24 hours) at 12/31/2022 1021 Last data filed at 12/31/2022 0600 Gross per 24 hour  Intake 619.38 ml  Output 800 ml  Net -180.62 ml      12/31/2022    3:46 AM 12/30/2022    9:57 PM 12/30/2022    3:42 PM  Last 3 Weights  Weight (lbs) 180 lb 3.2 oz 181 lb 11.2 oz 182 lb 9 oz  Weight (kg) 81.738 kg 82.419 kg 82.81 kg      Telemetry    NSR. Brief run of NSVT - Personally Reviewed  ECG    NSR with slight ST elevation AVL and V1-2, increased ST depression in inferolateral leads -  Personally Reviewed  Physical Exam   GEN: No acute distress.   Neck: No JVD Cardiac: RRR, no murmurs, rubs, or gallops.  Respiratory: Clear to auscultation bilaterally. GI: Soft, nontender, non-distended  MS: No edema; No deformity. Neuro:  Nonfocal  Psych: Normal affect   Labs    High Sensitivity Troponin:   Recent Labs  Lab 12/30/22 2236 12/31/22 0053  TROPONINIHS 10 12     Chemistry Recent Labs  Lab 12/30/22 1550 12/30/22 2236 12/31/22 0053  NA 142  --  142  K 3.6  --  4.0  CL 108  --  106  CO2 24  --  24  GLUCOSE 109*  --  172*  BUN 7*  --  8  CREATININE 1.01* 0.96 0.92  CALCIUM 8.8*  --  8.8*  MG  --   --  1.8  PROT 7.3  --   --   ALBUMIN 3.7  --   --   AST 15  --   --   ALT 10  --   --   ALKPHOS 54  --   --  BILITOT 1.2  --   --   GFRNONAA 57* >60 >60  ANIONGAP 10  --  12    Lipids  Recent Labs  Lab 12/31/22 0053  CHOL 100  TRIG 101  HDL 39*  LDLCALC 41  CHOLHDL 2.6    Hematology Recent Labs  Lab 12/30/22 1550 12/30/22 2236 12/31/22 0053  WBC 5.6 7.3 7.6  RBC 5.22* 5.63* 5.34*  HGB 13.5 14.7 13.8  HCT 44.3 47.0* 43.9  MCV 84.9 83.5 82.2  MCH 25.9* 26.1 25.8*  MCHC 30.5 31.3 31.4  RDW 13.9 14.0 14.0  PLT 283 276 274   Thyroid No results for input(s): "TSH", "FREET4" in the last 168 hours.  BNP Recent Labs  Lab 12/30/22 2236  BNP 1,847.2*    DDimer No results for input(s): "DDIMER" in the last 168 hours.   Radiology    DG Chest 2 View  Result Date: 12/30/2022 CLINICAL DATA:  Palpitations EXAM: CHEST - 2 VIEW COMPARISON:  CT done on 04/15/2022 FINDINGS: Transverse diameter of heart is increased. Central pulmonary vessels are prominent. There is an implantable cardiac monitoring device in left anterior chest wall. There is slight prominence of interstitial markings in the parahilar regions and lower lung fields. Small bilateral pleural effusions are seen. There is no pneumothorax. IMPRESSION: Cardiomegaly. Central pulmonary  vessels are prominent suggesting CHF. Small bilateral pleural effusions. Electronically Signed   By: Ernie Avena M.D.   On: 12/30/2022 16:10    Cardiac Studies   Echo: 12/06/22: IMPRESSIONS     1. Left ventricular ejection fraction, by estimation, is 25 to 30%. The  left ventricle has severely decreased function. The left ventricle  demonstrates global hypokinesis. The left ventricular internal cavity size  was moderately dilated. Left  ventricular diastolic parameters are indeterminate.   2. Right ventricular systolic function is normal. The right ventricular  size is normal.   3. The mitral valve is normal in structure. Mild to moderate mitral valve  regurgitation. No evidence of mitral stenosis.   4. The aortic valve is tricuspid. There is mild calcification of the  aortic valve. Aortic valve regurgitation is mild. Aortic valve sclerosis  is present, with no evidence of aortic valve stenosis.   5. The inferior vena cava is normal in size with greater than 50%  respiratory variability, suggesting right atrial pressure of 3 mmHg.    Patient Profile     77 y.o. female  with CAD and heart failure with reduced ejection fraction who is being seen 12/30/2022 for the evaluation of sustained ventricular tachycardia.   Assessment & Plan    Ventricular tachycardia: Appears to be polymorphic, consistent with ischemic substrate.  Also has a prolonged QTc, new from the previous EKG from April.   For the time being we will treat with intravenous amiodarone and plan to transition that to oral medication.  Continue beta-blocker as well.  EP on board. Given VT should consider revascularization of LAD providing anatomy is stable from August.  Long QT: Magnesium level pending.  Not truly hypokalemic, but  Potassium is in low normal range and will supplement it.  Ischemia appears to be the most likely driver for the long QT. CAD: Although she does not have angina pectoris, she does have exertional  dyspnea.  I personally reviewed her cath films from last August. She has severe stenosis in the mid LAD with heavy calcification. The RCA is small and diffusely diseased. I think it is appropriate to consider PCI of the LAD.  Will need plaque modification given heavy calcification with either intracoronary lithotripsy or atherectomy. Will start ASA in addition to plavix. The procedure and risks were reviewed including but not limited to death, myocardial infarction, stroke, arrythmias, bleeding, transfusion, emergency surgery, dye allergy, or renal dysfunction. The patient voices understanding and is agreeable to proceed. CHF: Severely reduced left ventricular systolic function. (NYHA functional class II-IIIA).  From a clinical standpoint she appears to be euvolemic.  The chest x-ray does show small bilateral pleural effusions and some degree of prominence of the central pulmonary arteries, but no true infiltrates to suggest pulmonary edema.  At the previous cardiac catheterization it was felt that the CAD cannot entirely explain her cardiomyopathy and that may be true.  Continue treatment with Arni/beta-blocker/aldosterone antagonist/SGLT2 inhibitor. 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 HLP: On current statin dose LDL is at target at 70.  Has a chronically low HDL in the 35-40 range.  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 DM 2: On appropriate agent such as SGLT2 inhibitor and GLP-1 agonist but also still requires insulin.  Controlled with most recent hemoglobin A1c 6.3% and nonfasting blood sugar 109.  For questions or updates, please contact [facility] Please consult [email or web address] for contact info under        Signed, Jahmeer Porche [place], MD  12/31/2022, 10:21 AM

## 2022-12-31 NOTE — Progress Notes (Signed)
Attempted to place 2nd IV in Left forearm- unsuccessful.

## 2022-12-31 NOTE — Consult Note (Addendum)
Cardiology Consultation   Patient ID: Cindy Montross MRN: [id number]; DOB: 03-02-1946  Admit date: 12/30/2022 Date of Consult: 12/31/2022  PCP:  Loura Back, NP   [facility] Providers Cardiologist:  Kristeen Miss, MD        Patient Profile:   Shenan Cancilla is a 77 y.o. female with a hx of HFrEF (EF 20-25% by echo in 11/2021), HTN, HLD, Type 2 DM, prior tobacco, stroke (Aug 2023) who is being seen 12/31/2022 for the evaluation of VT at the request of Dr. Royann Shivers.  Device information Abbott loop recorder, implanted 04/15/22 Cryptogenic stroke  History of Present Illness:   Ms. Sites last saw Dr. Elease Hashimoto 12/10/22, pending surgery fot sebaceous cyst L neck, felt a reasonable candidate, cleared to hold plavix for the procedure. Discussed perhaps possibility of intervening on her LAD disease at some point though not having any  anginal complaints. Her CM felt out of proportion to her CAD  Yesterday device clinic got an alert for VT/pause episode via her loop.  In review of her chart/CAD, recommended that she come in for further evaluation.  Admitted overnight, reported no obvious symptoms, was dozing on the couch the time of the event. Started on amiodarone  LABS K+ 3.6 > 4.0 <Ag 1.8 BUN/Creat 7/1.01 > 0.92 HS Trop 10, 12 BNP 1847 WBC 7.6 H/H 13/43 Plts 274  Home meds (rate/rhythm) Toprol 50mg  BID  LOOP remote Episode came as a pause episode, so beginning of the VT event is not seen, total duration is unknown. PMVT at least 23 seconds duration > 3 second pause > V escape beat > 3 second pause >  junctional beat > SB > SR    She feels well, no CP, SOB. She reports that at the time the arrhythmia happened she was in bed asleep, no awareness of the event. She denies any kind of CP, no DOE Occasionally feels winded when laying down, but doesn't have to do anything for it, just resolves No dizzy spells, near syncope or syncope. Reports good medication  compliance   Past Medical History:  Diagnosis Date   Allergy    CAD (coronary artery disease)    a. 03/2022 Cath: LM 40ost, 44m, LAD 40ost/p, 79m, 38m, LCX 59m, RCA nl, RV branch 70-->Med rx.   Chronic HFrEF (heart failure with reduced ejection fraction) (HCC)    a. 11/2021 Echo: EF 20-25%; b. 03/2022 Echo: EF 20-25%; c. 11/2022 Echo: EF 25-30%, glob HK.   Diabetes (HCC)    Diverticulosis    GERD (gastroesophageal reflux disease)    Hiatal hernia    Hyperlipidemia    Hypertension    Implantable loop recorder present    a. 03/2022 s/p Abbott IQ EL+ (serial [id number]) ILR in setting of cryptogenic stroke.   Ischemic cardiomyopathy    a. 11/2021 Echo: EF 20-25%; b. 03/2022 Echo: EF 20-25%; c. 11/2022 Echo: EF 25-30%, glob HK, nl RV fxn, mild-mod MR, mild AI, AoV sclerosis.   Neuropathy    feet   Schatzki's ring    Stroke [facility])    a. 03/2022 MRI Brain: subacute cortical infarct involving inf and med L occipial pole w/ petechial hemorrhage; b. 03/2022 s/p Abbott Assert IQ EL ILR.   Ventricular tachycardia (HCC)    a. 12/2022 noted on ILR.    Past Surgical History:  Procedure Laterality Date   LEFT HEART CATH AND CORONARY ANGIOGRAPHY N/A 04/16/2022   Procedure: LEFT HEART CATH AND CORONARY ANGIOGRAPHY;  Surgeon: Lennette Bihari, MD;  Location: [facility];  Service: Cardiovascular;  Laterality: N/A;   LOOP RECORDER INSERTION N/A 04/15/2022   Procedure: LOOP RECORDER INSERTION;  Surgeon: Lanier Prude, MD;  Location: [facility];  Service: Cardiovascular;  Laterality: N/A;   MASS EXCISION Right 06/07/2019   Procedure: Dawson Bills OF KELOID TO RIGHT NECK;  Surgeon: Louisa Second, MD;  Location: [facility];  Service: Plastics;  Laterality: Right;   TOOTH EXTRACTION  2013     Home Medications:  Prior to Admission medications   Medication Sig Start Date End Date Taking? Authorizing Provider  atorvastatin (LIPITOR) 80 MG tablet Take 80 mg by mouth daily. 04/18/22   Yes [provider]  clopidogrel (PLAVIX) 75 MG tablet Take 1 tablet (75 mg total) by mouth daily. 04/18/22  Yes Lama, Sarina Ill, MD  FARXIGA 10 MG TABS tablet Take 10 mg by mouth daily. 03/14/22  Yes [provider]  gabapentin (NEURONTIN) 600 MG tablet Take 600 mg by mouth 2 (two) times daily. 08/17/22  Yes [provider]  magnesium oxide (MAG-OX) 400 MG tablet Take 400 mg by mouth daily.   Yes [provider]  metFORMIN (GLUCOPHAGE-XR) 500 MG 24 hr tablet Take 500 mg by mouth daily with breakfast. 03/14/22  Yes [provider]  metoprolol succinate (TOPROL-XL) 50 MG 24 hr tablet Take 50 mg by mouth 2 (two) times daily. 07/31/22  Yes [provider]  OZEMPIC, 1 MG/DOSE, 4 MG/3ML SOPN Inject 1 mg into the skin once a week. 12/01/22  Yes [provider]  pantoprazole (PROTONIX) 20 MG tablet Take 1 tablet (20 mg total) by mouth daily. 09/10/22  Yes Pyrtle, Carie Caddy, MD  sacubitril-valsartan (ENTRESTO) 49-51 MG Take 1 tablet by mouth 2 (two) times daily. 09/06/22  Yes Nahser, Deloris Ping, MD  spironolactone (ALDACTONE) 25 MG tablet Take 12.5 mg by mouth daily. 03/18/22  Yes [provider]  TRESIBA FLEXTOUCH 200 UNIT/ML SOPN Take 80 Units by mouth at bedtime.  10/12/18  Yes [provider]  linagliptin (TRADJENTA) 5 MG TABS tablet Take 5 mg by mouth daily.    [provider]  Multiple Vitamin (MULTIVITAMIN) tablet Take 1 tablet by mouth daily.    [provider]    Inpatient Medications: Scheduled Meds:  atorvastatin  80 mg Oral Daily   clopidogrel  75 mg Oral Daily   dapagliflozin propanediol  10 mg Oral Daily   gabapentin  600 mg Oral BID   heparin  5,000 Units Subcutaneous Q8H   insulin glargine-yfgn  40 Units Subcutaneous QHS   linagliptin  5 mg Oral Daily   magnesium oxide  400 mg Oral Daily   metoprolol succinate  50 mg Oral BID   multivitamin with minerals  1 tablet Oral Daily   pantoprazole  20 mg  Oral Daily   sacubitril-valsartan  1 tablet Oral BID   sodium chloride flush  3 mL Intravenous Q12H   spironolactone  12.5 mg Oral Daily   Continuous Infusions:  sodium chloride     sodium chloride 10 mL/hr at 12/31/22 0518   amiodarone 30 mg/hr (12/31/22 0640)   PRN Meds: sodium chloride, acetaminophen, nitroGLYCERIN, ondansetron (ZOFRAN) IV, mouth rinse, sodium chloride flush  Allergies:   No Known Allergies  Social History:   Social History   Socioeconomic History   Marital status: Single    Spouse name: Not on file   Number of children: Not on file   Years of education: Not on  file   Highest education level: Not on file  Occupational History   Occupation: Retired  Tobacco Use   Smoking status: Former    Types: Cigarettes    Quit date: 08/20/2003    Years since quitting: 19.3   Smokeless tobacco: Never  Vaping Use   Vaping Use: Never used  Substance and Sexual Activity   Alcohol use: No   Drug use: No   Sexual activity: Not on file  Other Topics Concern   Not on file  Social History Narrative   Not on file   Social Determinants of Health   Financial Resource Strain: Not on file  Food Insecurity: No Food Insecurity (12/30/2022)   Hunger Vital Sign    Worried About Running Out of Food in the Last Year: Never true    Ran Out of Food in the Last Year: Never true  Transportation Needs: No Transportation Needs (12/30/2022)   PRAPARE - Administrator, Civil Service (Medical): No    Lack of Transportation (Non-Medical): No  Physical Activity: Not on file  Stress: Not on file  Social Connections: Not on file  Intimate Partner Violence: Not At Risk (12/30/2022)   Humiliation, Afraid, Rape, and Kick questionnaire    Fear of Current or Ex-Partner: No    Emotionally Abused: No    Physically Abused: No    Sexually Abused: No    Family History:   Family History  Problem Relation Age of Onset   Colon cancer Mother 75   Heart attack Father    Cancer Sister         "female cancer"   Heart attack Brother    Stomach cancer Neg Hx    Pancreatic cancer Neg Hx    Esophageal cancer Neg Hx      ROS:  Please see the history of present illness.  All other ROS reviewed and negative.     Physical Exam/Data:   Vitals:   12/30/22 2157 12/31/22 0159 12/31/22 0346 12/31/22 0642  BP: 117/88 119/85  114/87  Pulse: 88 79  78  Resp: 20 20  18   Temp: 97.8 F (36.6 C) 97.9 F (36.6 C)  97.7 F (36.5 C)  TempSrc: Oral Oral  Oral  SpO2: 98% 95%  94%  Weight: 82.4 kg  81.7 kg   Height: 5\' 5"  (1.651 m)       Intake/Output Summary (Last 24 hours) at 12/31/2022 0707 Last data filed at 12/31/2022 0600 Gross per 24 hour  Intake 619.38 ml  Output 800 ml  Net -180.62 ml      12/31/2022    3:46 AM 12/30/2022    9:57 PM 12/30/2022    3:42 PM  Last 3 Weights  Weight (lbs) 180 lb 3.2 oz 181 lb 11.2 oz 182 lb 9 oz  Weight (kg) 81.738 kg 82.419 kg 82.81 kg     Body mass index is 29.99 kg/m.  General:  Well nourished, well developed, in no acute distress HEENT: normal Neck: no JVD Vascular: No carotid bruits; Distal pulses 2+ bilaterally Cardiac:  RRR; no murmurs, gallops or rubs Lungs:  CTA b/l, no wheezing, rhonchi or rales  Abd: soft, nontender Ext: no edema Musculoskeletal:  No deformities, BUE and BLE strength normal and equal Skin: warm and dry  Neuro:  no focal abnormalities noted Psych:  Normal affect   EKG:  The EKG was personally reviewed and demonstrates:    SR PACs, 99bpm, inf/lat ST/T changes unchanged from prior  Telemetry:  Telemetry was personally reviewed and demonstrates:   SR 80's, infrequent PVCs, rare couplet, one very brief PAT  Relevant CV Studies:   12/06/22: TTE  1. Left ventricular ejection fraction, by estimation, is 25 to 30%. The  left ventricle has severely decreased function. The left ventricle  demonstrates global hypokinesis. The left ventricular internal cavity size  was moderately dilated. Left   ventricular diastolic parameters are indeterminate.   2. Right ventricular systolic function is normal. The right ventricular  size is normal.   3. The mitral valve is normal in structure. Mild to moderate mitral valve  regurgitation. No evidence of mitral stenosis.   4. The aortic valve is tricuspid. There is mild calcification of the  aortic valve. Aortic valve regurgitation is mild. Aortic valve sclerosis  is present, with no evidence of aortic valve stenosis.   5. The inferior vena cava is normal in size with greater than 50%  respiratory variability, suggesting right atrial pressure of 3 mmHg.    04/16/22: LHC  Ost LM lesion is 40% stenosed.   Mid LM to Dist LM lesion is 30% stenosed.   Ost LAD to Prox LAD lesion is 40% stenosed.   Mid LAD-1 lesion is 80% stenosed.   Mid LAD-2 lesion is 30% stenosed.   RV Branch lesion is 70% stenosed.   Mid Cx lesion is 5% stenosed.   Significant multi vessel coronary calcification.   The left main has 40% ostial 30% distal stenosis; the LAD has 40% ostial to proximal stenosis followed by focal calcified 80% stenosis prior to the takeoff of a septal perforator and the second diagonal vessel with 30% mid stenosis; mild nonobstructive disease in the left circumflex vessel, and diffuse 70% stenosis in the marginal branch of the RCA.   LVEDP is elevated at 22 mmHg.   RECOMMENDATION: Patient has significant LV dysfunction out of proportion to her or significant LAD stenosis.  She has been without anginal symptomatology.  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  Consider addition of anti-ischemic medical therapy with 80% LAD stenosis.  Bari Handshoe ask colleagues to review.  If patient has increasing symptomatology consider PCI to LAD with possible shockwave or CSI atherectomy if needed.    Echocardiogram: 04/13/2022 IMPRESSIONS   1. Left ventricular ejection fraction, by estimation, is 20 to 25%. Left   ventricular ejection fraction by 2D MOD biplane is 21.5 %. The left  ventricle has severely decreased function. The left ventricle demonstrates  global hypokinesis. There is mild  left ventricular hypertrophy. Left ventricular diastolic parameters are  consistent with Grade I diastolic dysfunction (impaired relaxation). There  is incoordinate septal motion.   2. Right ventricular systolic function is low normal. The right  ventricular size is normal. Tricuspid regurgitation signal is inadequate  for assessing PA pressure.   3. The mitral valve is abnormal. Mild mitral valve regurgitation.   4. The aortic valve is tricuspid. Aortic valve regurgitation is trivial.  Aortic valve sclerosis is present, with no evidence of aortic valve  stenosis.   5. Aortic dilatation noted. There is mild dilatation of the aortic root,  measuring 40 mm.   6. The inferior vena cava is normal in size with greater than 50%  respiratory variability, suggesting right atrial pressure of 3 mmHg.   7. Agitated saline contrast bubble study was positive with shunting  observed after >6 cardiac cycles suggestive of intrapulmonary shunting. No  atrial level shunting  was noted.   Comparison(s): No significant change from prior study. 12/04/2021: LVEF  20-25%.     Laboratory Data:  High Sensitivity Troponin:   Recent Labs  Lab 12/30/22 2236 12/31/22 0053  TROPONINIHS 10 12     Chemistry Recent Labs  Lab 12/30/22 1550 12/30/22 2236 12/31/22 0053  NA 142  --  142  K 3.6  --  4.0  CL 108  --  106  CO2 24  --  24  GLUCOSE 109*  --  172*  BUN 7*  --  8  CREATININE 1.01* 0.96 0.92  CALCIUM 8.8*  --  8.8*  MG  --   --  1.8  GFRNONAA 57* >60 >60  ANIONGAP 10  --  12    Recent Labs  Lab 12/30/22 1550  PROT 7.3  ALBUMIN 3.7  AST 15  ALT 10  ALKPHOS 54  BILITOT 1.2   Lipids  Recent Labs  Lab 12/31/22 0053  CHOL 100  TRIG 101  HDL 39*  LDLCALC 41  CHOLHDL 2.6    Hematology Recent Labs  Lab  12/30/22 1550 12/30/22 2236 12/31/22 0053  WBC 5.6 7.3 7.6  RBC 5.22* 5.63* 5.34*  HGB 13.5 14.7 13.8  HCT 44.3 47.0* 43.9  MCV 84.9 83.5 82.2  MCH 25.9* 26.1 25.8*  MCHC 30.5 31.3 31.4  RDW 13.9 14.0 14.0  PLT 283 276 274   Thyroid No results for input(s): "TSH", "FREET4" in the last 168 hours.  BNP Recent Labs  Lab 12/30/22 2236  BNP 1,847.2*    DDimer No results for input(s): "DDIMER" in the last 168 hours.   Radiology/Studies:   DG Chest 2 View Result Date: 12/30/2022 CLINICAL DATA:  Palpitations EXAM: CHEST - 2 VIEW COMPARISON:  CT done on 04/15/2022 FINDINGS: Transverse diameter of heart is increased. Central pulmonary vessels are prominent. There is an implantable cardiac monitoring device in left anterior chest wall. There is slight prominence of interstitial markings in the parahilar regions and lower lung fields. Small bilateral pleural effusions are seen. There is no pneumothorax. IMPRESSION: Cardiomegaly. Central pulmonary vessels are prominent suggesting CHF. Small bilateral pleural effusions. Electronically Signed   By: Ernie Avena M.D.   On: 12/30/2022 16:10     Assessment and Plan:   PMVT Pt reports she would have been in bed asleep Denies any symptoms of dizzy spells, near syncope or syncope that day or otherwise   Cardiomyopathy Felt not to be ischemic/out of proportion to her her CAD  CAD No anginal symptoms Planned for cath/PCI Suspect CAD as driver for her VT  Agree with amiodarone for now at least EP MD Hydie Langan see once out of procedure  Risk Assessment/Risk Scores:     For questions or updates, please contact [facility] Please consult [email or web address] for contact info under    Signed, Sheilah Pigeon, PA-C  12/31/2022 7:07 AM  I have seen and examined this patient with Francis Dowse.  Agree with above, note added to reflect my findings.  Patient presented to the hospital after having VT noted on her ILR implant. Patinet was  asymptomatic at the time, sleeping.  GEN: Well nourished, well developed, in no acute distress  HEENT: normal  Neck: no JVD, carotid bruits, or masses Cardiac: RRR; no murmurs, rubs, or gallops,no edema  Respiratory:  clear to auscultation bilaterally, normal work of breathing GI: soft, nontender, nondistended, + BS MS: no deformity or atrophy  Skin: warm and dry, device site well healed  Neuro:  Strength and sensation are intact Psych: euthymic mood, full affect   VT: has known CAD. Sallie Maker plan for LHC. Has severe LAD stenosis and now s/p stent to proximal LAD. With stent in place, no Auset Fritzler hold off no ICD therapy for now. Would maximize beta blocker to tolerated heart rate.   Kalissa Grays M. Stella Encarnacion MD 12/31/2022 6:46 PM

## 2023-01-01 ENCOUNTER — Encounter (HOSPITAL_COMMUNITY): Payer: Self-pay | Admitting: Cardiology

## 2023-01-01 DIAGNOSIS — Z955 Presence of coronary angioplasty implant and graft: Secondary | ICD-10-CM | POA: Diagnosis not present

## 2023-01-01 DIAGNOSIS — I472 Ventricular tachycardia, unspecified: Secondary | ICD-10-CM | POA: Diagnosis not present

## 2023-01-01 LAB — COMPREHENSIVE METABOLIC PANEL
ALT: 12 U/L (ref 0–44)
AST: 29 U/L (ref 15–41)
Albumin: 3.4 g/dL — ABNORMAL LOW (ref 3.5–5.0)
Alkaline Phosphatase: 57 U/L (ref 38–126)
Anion gap: 10 (ref 5–15)
BUN: 13 mg/dL (ref 8–23)
CO2: 23 mmol/L (ref 22–32)
Calcium: 9.1 mg/dL (ref 8.9–10.3)
Chloride: 106 mmol/L (ref 98–111)
Creatinine, Ser: 1.05 mg/dL — ABNORMAL HIGH (ref 0.44–1.00)
GFR, Estimated: 54 mL/min — ABNORMAL LOW (ref >60)
Glucose, Bld: 121 mg/dL — ABNORMAL HIGH (ref 70–99)
Potassium: 4.1 mmol/L (ref 3.5–5.1)
Sodium: 139 mmol/L (ref 135–145)
Total Bilirubin: 1.4 mg/dL — ABNORMAL HIGH (ref 0.3–1.2)
Total Protein: 7.1 g/dL (ref 6.5–8.1)

## 2023-01-01 LAB — CBC
HCT: 41.3 % (ref 36.0–46.0)
Hemoglobin: 13.1 g/dL (ref 12.0–15.0)
MCH: 26.1 pg (ref 26.0–34.0)
MCHC: 31.7 g/dL (ref 30.0–36.0)
MCV: 82.4 fL (ref 80.0–100.0)
Platelets: 257 10*3/uL (ref 150–400)
RBC: 5.01 MIL/uL (ref 3.87–5.11)
RDW: 14.3 % (ref 11.5–15.5)
WBC: 5.8 10*3/uL (ref 4.0–10.5)
nRBC: 0 % (ref 0.0–0.2)

## 2023-01-01 LAB — TSH: TSH: 3.959 u[IU]/mL (ref 0.350–4.500)

## 2023-01-01 LAB — GLUCOSE, CAPILLARY
Glucose-Capillary: 91 mg/dL (ref 70–99)
Glucose-Capillary: 153 mg/dL — ABNORMAL HIGH (ref 70–99)
Glucose-Capillary: 137 mg/dL — ABNORMAL HIGH (ref 70–99)
Glucose-Capillary: 177 mg/dL — ABNORMAL HIGH (ref 70–99)

## 2023-01-01 LAB — BASIC METABOLIC PANEL
Anion gap: 10 (ref 5–15)
BUN: 11 mg/dL (ref 8–23)
CO2: 27 mmol/L (ref 22–32)
Calcium: 8.9 mg/dL (ref 8.9–10.3)
Chloride: 103 mmol/L (ref 98–111)
Creatinine, Ser: 0.99 mg/dL (ref 0.44–1.00)
GFR, Estimated: 58 mL/min — ABNORMAL LOW (ref >60)
Glucose, Bld: 96 mg/dL (ref 70–99)
Potassium: 3.4 mmol/L — ABNORMAL LOW (ref 3.5–5.1)
Sodium: 140 mmol/L (ref 135–145)

## 2023-01-01 LAB — MAGNESIUM: Magnesium: 2 mg/dL (ref 1.7–2.4)

## 2023-01-01 MED ORDER — POTASSIUM CHLORIDE 20 MEQ PO PACK
40.0000 meq | PACK | Freq: Once | ORAL | Status: AC
  Administered 2023-01-01: 40 meq via ORAL
  Filled 2023-01-01: qty 2

## 2023-01-01 MED ORDER — AMIODARONE HCL 200 MG PO TABS
400.0000 mg | ORAL_TABLET | Freq: Two times a day (BID) | ORAL | Status: DC
  Administered 2023-01-01 – 2023-01-02 (×3): 400 mg via ORAL
  Filled 2023-01-01 (×3): qty 2

## 2023-01-01 MED FILL — Verapamil HCl IV Soln 2.5 MG/ML: INTRAVENOUS | Qty: 2 | Status: AC

## 2023-01-01 NOTE — Progress Notes (Addendum)
Reached out to [employer] to discuss programming of her loop. Device alert came in a a pause episode, not tachy. Appears to have been undersensing/and calling noise rather then a tachy episode. [employer] rep is reaching out to their tech services/loop recorder specialist to review and reprogram as needed to better identify VT/tachy episodes.  Francis Dowse, PA-C  D/w industry rep shortened sensed refractory and onset to improve sensing. Francis Dowse, PA-C

## 2023-01-01 NOTE — Progress Notes (Signed)
  Progress Note   Date: 01/01/2023  Patient Name: Paula Wong        MRN#: [id number]  Clarification of diagnosis:  CKD Stage II

## 2023-01-01 NOTE — Progress Notes (Addendum)
After discussion with Dr. Elberta Fortis, patient is to transition from IV amiodarone to 400 mg twice daily for the next 2 weeks and then continuation on 200 mg daily, unless otherwise specified by follow-up provider outpatient. She will get her first dose today on 01/01/2023.  TSH 3.9 Will obtain liver enzymes on tomorrow's labs.

## 2023-01-01 NOTE — Progress Notes (Signed)
[employer] Loop Recorder 

## 2023-01-01 NOTE — Progress Notes (Signed)
CARDIAC REHAB PHASE I   PRE:  Rate/Rhythm: 78 SR   BP:  Sitting: 100/73      SaO2: 98 RA   MODE:  Ambulation: 240 ft   POST:  Rate/Rhythm: 82 SR   BP:  Sitting: 121/94      SaO2: 97 RA   Pt ambulated independently in hall. Tolerating well with no CP,dizziness or SOB. Pt walked at a quick steady pace. Returned to chair with call bell and bedside table in reach. Post stent education including site care, restrictions, risk factors, exercise guidelines, NTG use, heart healthy diabetic diet, antiplatelet therapy importance and CRP2 reviewed. All questions and concerns addressed. Will refer to [facility] for CRP2. Will continue to follow.   [id number]  Woodroe Chen, RN BSN 01/01/2023 9:58 AM

## 2023-01-01 NOTE — Progress Notes (Signed)
OT Screen Note  Patient Details Name: Paula Wong MRN: [id number] DOB: Jun 01, 1946   Cancelled Treatment:    Reason Eval/Treat Not Completed: OT screened, no needs identified, will sign off (Pt presenting with no acute OT needs. Functioning independently. Please order OT if pt's status changes.)  Limmie Patricia, OTR/L,CBIS  Supplemental OT - [facility] Secure Chat Preferred   01/01/2023, 12:18 PM

## 2023-01-01 NOTE — Plan of Care (Signed)

## 2023-01-01 NOTE — Progress Notes (Signed)
PT Cancellation Note  Patient Details Name: Paula Wong MRN: [id number] DOB: 10-Jun-1946   Cancelled Treatment:    Reason Eval/Treat Not Completed: PT screened, no needs identified, will sign off PT orders received, chart reviewed. Pt noted to be walking independently with cardiac rehab this morning - confirmed with team. Pt does not demonstrate acute PT needs at this time. PT to complete current orders, please re-consult if new needs arise.  Aleda Grana, PT, DPT 01/01/23, 11:24 AM   Sandi Mariscal 01/01/2023, 11:24 AM

## 2023-01-01 NOTE — Progress Notes (Signed)
Rounding Note    Patient Name: Paula Wong Date of Encounter: 01/01/2023  [facility] Cardiologist: Kristeen Miss, MD   Subjective   Feeling good with no complaints.  Inpatient Medications    Scheduled Meds:  aspirin  81 mg Oral Daily   atorvastatin  80 mg Oral Daily   clopidogrel  75 mg Oral Daily   dapagliflozin propanediol  10 mg Oral Daily   enoxaparin (LOVENOX) injection  40 mg Subcutaneous Q24H   gabapentin  600 mg Oral BID   insulin glargine-yfgn  40 Units Subcutaneous QHS   linagliptin  5 mg Oral Daily   magnesium oxide  400 mg Oral Daily   metoprolol succinate  50 mg Oral BID   multivitamin with minerals  1 tablet Oral Daily   pantoprazole  20 mg Oral Daily   sacubitril-valsartan  1 tablet Oral BID   sodium chloride flush  3 mL Intravenous Q12H   sodium chloride flush  3 mL Intravenous Q12H   sodium chloride flush  3 mL Intravenous Q12H   spironolactone  12.5 mg Oral Daily   Continuous Infusions:  sodium chloride     amiodarone Stopped (01/01/23 0755)   PRN Meds: sodium chloride, acetaminophen, nitroGLYCERIN, ondansetron (ZOFRAN) IV, mouth rinse, sodium chloride flush   Vital Signs    Vitals:   12/31/22 1700 12/31/22 1815 01/01/23 0551 01/01/23 0912  BP:  101/69 100/73 (!) 121/94  Pulse: 78 81 76   Resp: (!) 22 19 20 16   Temp:  (!) 97.5 F (36.4 C) 98.4 F (36.9 C)   TempSrc:  Oral Oral   SpO2: 93% 97% 93%   Weight:   84.5 kg   Height:        Intake/Output Summary (Last 24 hours) at 01/01/2023 0913 Last data filed at 01/01/2023 0800 Gross per 24 hour  Intake 258.05 ml  Output 200 ml  Net 58.05 ml      01/01/2023    5:51 AM 12/31/2022    3:46 AM 12/30/2022    9:57 PM  Last 3 Weights  Weight (lbs) 186 lb 3.2 oz 180 lb 3.2 oz 181 lb 11.2 oz  Weight (kg) 84.46 kg 81.738 kg 82.419 kg      Telemetry    NSR, couplet PVCs with no PMVT - Personally Reviewed  ECG    NSR nonspecific  STTW changes in lateral leads. Unchanged  from prior. - Personally Reviewed  Physical Exam   GEN: No acute distress.   Neck: No JVD Cardiac: RRR, no murmurs, rubs, or gallops.  Respiratory: Clear to auscultation bilaterally. GI: Soft, nontender, non-distended  MS: No edema; No deformity. L radial cath site free of discharge, redness, tenderness.  Neuro:  Nonfocal  Psych: Normal affect   Labs    High Sensitivity Troponin:   Recent Labs  Lab 12/30/22 2236 12/31/22 0053  TROPONINIHS 10 12     Chemistry Recent Labs  Lab 12/30/22 1550 12/30/22 2236 12/31/22 0053 12/31/22 1844 01/01/23 0303  NA 142  --  142  --  140  K 3.6  --  4.0  --  3.4*  CL 108  --  106  --  103  CO2 24  --  24  --  27  GLUCOSE 109*  --  172*  --  96  BUN 7*  --  8  --  11  CREATININE 1.01*   < > 0.92 0.98 0.99  CALCIUM 8.8*  --  8.8*  --  8.9  MG  --   --  1.8  --   --   PROT 7.3  --   --   --   --   ALBUMIN 3.7  --   --   --   --   AST 15  --   --   --   --   ALT 10  --   --   --   --   ALKPHOS 54  --   --   --   --   BILITOT 1.2  --   --   --   --   GFRNONAA 57*   < > >60 59* 58*  ANIONGAP 10  --  12  --  10   < > = values in this interval not displayed.    Lipids  Recent Labs  Lab 12/31/22 0053  CHOL 100  TRIG 101  HDL 39*  LDLCALC 41  CHOLHDL 2.6    Hematology Recent Labs  Lab 12/31/22 0053 12/31/22 1844 01/01/23 0303  WBC 7.6 7.9 5.8  RBC 5.34* 5.30* 5.01  HGB 13.8 13.9 13.1  HCT 43.9 44.6 41.3  MCV 82.2 84.2 82.4  MCH 25.8* 26.2 26.1  MCHC 31.4 31.2 31.7  RDW 14.0 14.2 14.3  PLT 274 269 257   Thyroid No results for input(s): "TSH", "FREET4" in the last 168 hours.  BNP Recent Labs  Lab 12/30/22 2236  BNP 1,847.2*    DDimer No results for input(s): "DDIMER" in the last 168 hours.   Radiology    CARDIAC CATHETERIZATION  Result Date: 12/31/2022   Mid Cx lesion is 5% stenosed.   Ost LAD to Prox LAD lesion is 40% stenosed.   Mid LAD-2 lesion is 30% stenosed.   Ost LM lesion is 40% stenosed.   Mid LM to  Dist LM lesion is 30% stenosed.   RV Branch lesion is 70% stenosed.   Prox LAD to Mid LAD lesion is 85% stenosed.   Mid LAD-1 lesion is 85% stenosed.   A drug-eluting stent was successfully placed using a SYNERGY XD 3.0X24.   Post intervention, there is a 0% residual stenosis.   Post intervention, there is a 0% residual stenosis.   LV end diastolic pressure is moderately elevated. 2 vessel obstructive CAD with severe LAD stenosis sequentially with heavy calcification. Moderate RCA disease diffusely in a small branch Moderately elevated LVEDP 25 mm Hg Successful PCI of the mid LAD using IVUS guidance, Shockwave intracoronary lithotripsy and DES x 1. Plan: DAPT for one year. Anticipate possible DC tomorrow.   DG Chest 2 View  Result Date: 12/30/2022 CLINICAL DATA:  Palpitations EXAM: CHEST - 2 VIEW COMPARISON:  CT done on 04/15/2022 FINDINGS: Transverse diameter of heart is increased. Central pulmonary vessels are prominent. There is an implantable cardiac monitoring device in left anterior chest wall. There is slight prominence of interstitial markings in the parahilar regions and lower lung fields. Small bilateral pleural effusions are seen. There is no pneumothorax. IMPRESSION: Cardiomegaly. Central pulmonary vessels are prominent suggesting CHF. Small bilateral pleural effusions. Electronically Signed   By: Ernie Avena M.D.   On: 12/30/2022 16:10    Cardiac Studies   Left heart catheterization 12/31/2022 Mid Cx lesion is 5% stenosed.   Ost LAD to Prox LAD lesion is 40% stenosed.   Mid LAD-2 lesion is 30% stenosed.   Ost LM lesion is 40% stenosed.   Mid LM to Dist LM lesion is 30% stenosed.   RV  Branch lesion is 70% stenosed.   Prox LAD to Mid LAD lesion is 85% stenosed.   Mid LAD-1 lesion is 85% stenosed.   A drug-eluting stent was successfully placed using a SYNERGY XD 3.0X24.   Post intervention, there is a 0% residual stenosis.   Post intervention, there is a 0% residual stenosis.    LV end diastolic pressure is moderately elevated.   2 vessel obstructive CAD with severe LAD stenosis sequentially with heavy calcification. Moderate RCA disease diffusely in a small branch Moderately elevated LVEDP 25 mm Hg Successful PCI of the mid LAD using IVUS guidance, Shockwave intracoronary lithotripsy and DES x 1.   Plan: DAPT for one year. Anticipate possible DC tomorrow.   Echocardiogram 12/06/2022  1. Left ventricular ejection fraction, by estimation, is 25 to 30%. The  left ventricle has severely decreased function. The left ventricle  demonstrates global hypokinesis. The left ventricular internal cavity size  was moderately dilated. Left  ventricular diastolic parameters are indeterminate.   2. Right ventricular systolic function is normal. The right ventricular  size is normal.   3. The mitral valve is normal in structure. Mild to moderate mitral valve  regurgitation. No evidence of mitral stenosis.   4. The aortic valve is tricuspid. There is mild calcification of the  aortic valve. Aortic valve regurgitation is mild. Aortic valve sclerosis  is present, with no evidence of aortic valve stenosis.   5. The inferior vena cava is normal in size with greater than 50%  respiratory variability, suggesting right atrial pressure of 3 mmHg.   Patient Profile     77 y.o. 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  Has recently underwent cardiac catheterization on 12/31/2022 with DES to LAD.  Assessment & Plan    Asymptomatic ventricular tachycardia on loop monitor Thought to be ischemic related.  No recurrences on telemetry.  Patient has been started on IV amiodarone.  Likely able to transition to oral today.  Will discuss with MD about  dosing and duration.   CAD status post DES to LAD Has had no anginal complaints but has had exertional dyspnea.  Thought to be underlying etiology of arrhythmia. Underwent catheterization yesterday that showed two-vessel obstructive CAD with severe LAD stenosis.  Moderate RCA disease.  Moderately elevated LVEDP 25 mmHg.  Underwent successful PCI to mid LAD.  Plan to continue DAPT therapy as below. Continue aspirin 81 mg daily and Plavix 75 daily for 12 months  Continue Atorvastatin 80 mg Will walk her today and sees how she feels. Likely to be be discharged soon.  Chronic HFrEF LVEF 25 to 30% on echocardiogram on 12/06/2022.  Appears euvolemic today. On maximized dosing of GDMT as blood pressure allows.  Chest x-ray showing small bilateral pleural effusions with some pulmonary congestion.  BNP 1800+ Patient has also had minor drops in oxygen saturations.  Can consider one-time dosing of Lasix. Continue Farxiga 10 mg, Toprol XL 50 mg, Entresto 49-51mg  twice daily, spironolactone 12.5 mg  Mild to moderate mitral valve regurgitation Continue to monitor.  CKD stage II Stable. Appears around her baseline with a GFR around 60.  Hypokalemia  3.4, will supplement.  Hyperlipidemia Continue atorvastatin 80 mg. LDL 41. Lpa 109.5.  Type 2 diabetes mellitus Controlled with most recent hemoglobin A1c 6.3%  For questions or updates, please contact [facility] Please consult [email or web address] for contact info under      Signed, Abagail Kitchens, PA-C  01/01/2023, 9:13 AM

## 2023-01-01 NOTE — Care Management (Signed)
  Transition of Care [facility]) Screening Note   Patient Details  Name: Deliyah Temkin Date of Birth: 10-01-1945   Transition of Care [facility]) CM/SW Contact:    Gala Lewandowsky, RN Phone Number: 01/01/2023, 12:31 PM    Transition of Care Department [facility]) has reviewed the patient and no TOC needs have been identified at this time. Patient presented for  ventricular tachycardia. PTA patient states she was independent from home alone in an apartment. Patient has insurance, PCP, and states she has transportation via DSS to appointments. No home needs identified at this time. Case Manager will continue to follow for additional needs.

## 2023-01-02 ENCOUNTER — Telehealth: Payer: Self-pay | Admitting: Cardiology

## 2023-01-02 ENCOUNTER — Other Ambulatory Visit (HOSPITAL_COMMUNITY): Payer: Self-pay

## 2023-01-02 DIAGNOSIS — I5022 Chronic systolic (congestive) heart failure: Secondary | ICD-10-CM | POA: Insufficient documentation

## 2023-01-02 DIAGNOSIS — I340 Nonrheumatic mitral (valve) insufficiency: Secondary | ICD-10-CM | POA: Insufficient documentation

## 2023-01-02 DIAGNOSIS — I2510 Atherosclerotic heart disease of native coronary artery without angina pectoris: Secondary | ICD-10-CM

## 2023-01-02 DIAGNOSIS — N182 Chronic kidney disease, stage 2 (mild): Secondary | ICD-10-CM | POA: Insufficient documentation

## 2023-01-02 LAB — CBC
HCT: 43.3 % (ref 36.0–46.0)
Hemoglobin: 13.9 g/dL (ref 12.0–15.0)
MCH: 26.4 pg (ref 26.0–34.0)
MCHC: 32.1 g/dL (ref 30.0–36.0)
MCV: 82.3 fL (ref 80.0–100.0)
Platelets: 283 10*3/uL (ref 150–400)
RBC: 5.26 MIL/uL — ABNORMAL HIGH (ref 3.87–5.11)
RDW: 14.2 % (ref 11.5–15.5)
WBC: 4.7 10*3/uL (ref 4.0–10.5)
nRBC: 0 % (ref 0.0–0.2)

## 2023-01-02 LAB — BASIC METABOLIC PANEL
Anion gap: 16 — ABNORMAL HIGH (ref 5–15)
BUN: 12 mg/dL (ref 8–23)
CO2: 21 mmol/L — ABNORMAL LOW (ref 22–32)
Calcium: 9.3 mg/dL (ref 8.9–10.3)
Chloride: 106 mmol/L (ref 98–111)
Creatinine, Ser: 1.07 mg/dL — ABNORMAL HIGH (ref 0.44–1.00)
GFR, Estimated: 53 mL/min — ABNORMAL LOW (ref >60)
Glucose, Bld: 88 mg/dL (ref 70–99)
Potassium: 3.9 mmol/L (ref 3.5–5.1)
Sodium: 143 mmol/L (ref 135–145)

## 2023-01-02 LAB — GLUCOSE, CAPILLARY
Glucose-Capillary: 118 mg/dL — ABNORMAL HIGH (ref 70–99)
Glucose-Capillary: 104 mg/dL — ABNORMAL HIGH (ref 70–99)

## 2023-01-02 LAB — MAGNESIUM: Magnesium: 2 mg/dL (ref 1.7–2.4)

## 2023-01-02 MED ORDER — POTASSIUM CHLORIDE CRYS ER 10 MEQ PO TBCR
10.0000 meq | EXTENDED_RELEASE_TABLET | Freq: Every day | ORAL | 5 refills | Status: DC
  Filled 2023-01-02: qty 30, 30d supply, fill #0

## 2023-01-02 MED ORDER — ASPIRIN 81 MG PO CHEW
81.0000 mg | CHEWABLE_TABLET | Freq: Every day | ORAL | 3 refills | Status: DC
  Filled 2023-01-02: qty 90, 90d supply, fill #0

## 2023-01-02 MED ORDER — NITROGLYCERIN 0.4 MG SL SUBL
0.4000 mg | SUBLINGUAL_TABLET | SUBLINGUAL | 2 refills | Status: DC | PRN
  Filled 2023-01-02: qty 25, 7d supply, fill #0

## 2023-01-02 MED ORDER — AMIODARONE HCL 200 MG PO TABS
ORAL_TABLET | ORAL | 0 refills | Status: DC
  Filled 2023-01-02: qty 94, 38d supply, fill #0

## 2023-01-02 MED ORDER — POTASSIUM CHLORIDE CRYS ER 10 MEQ PO TBCR
10.0000 meq | EXTENDED_RELEASE_TABLET | Freq: Every day | ORAL | Status: DC
  Administered 2023-01-02: 10 meq via ORAL
  Filled 2023-01-02: qty 1

## 2023-01-02 MED ORDER — CLOPIDOGREL BISULFATE 75 MG PO TABS
75.0000 mg | ORAL_TABLET | Freq: Every day | ORAL | 11 refills | Status: AC
  Filled 2023-01-02: qty 30, 30d supply, fill #0

## 2023-01-02 NOTE — Care Management Important Message (Signed)
Important Message  Patient Details  Name: Paula Wong MRN: [id number] Date of Birth: 1946-02-04   Medicare Important Message Given:  Yes     Renie Ora 01/02/2023, 8:45 AM

## 2023-01-02 NOTE — Progress Notes (Signed)
Patient had two different episodes of vtach  01/01/2023 2120: 7 beats of vtach 01/02/2023 0132: 15 beats ov tach.  Patient has been asymptomatic in both events. Dr. Jayme Cloud.no new orders at this time

## 2023-01-02 NOTE — Progress Notes (Signed)
Paula Wong to be D/C'd home per MD order. Discussed with the patient and all questions fully answered.  Skin clean, dry and intact without evidence of skin break down, no evidence of skin tears noted.  IV catheter discontinued intact. Site without signs and symptoms of complications. Dressing and pressure applied. L radial dressing clean and intact. An After Visit Summary was printed and given to the patient.  Patient escorted via WC, and D/C home via private auto.  Jon Gills  01/02/2023

## 2023-01-02 NOTE — Telephone Encounter (Signed)
   Transition of Care Follow-up Phone Call Request    Patient Name: Kyrra Jeannot Date of Birth: 20-Nov-1945 Date of Encounter: 01/02/2023  Primary Care Provider:  Loura Back, NP Primary Cardiologist:  Kristeen Miss, MD  Charlesetta Ivory has been scheduled for a transition of care follow up appointment with a [facility] provider: With Dr. Elease Hashimoto on 01/06/2023   Please reach out to Charlesetta Ivory within 48 hours to confirm appointment.  Abagail Kitchens, PA-C  01/02/2023, 12:05 PM

## 2023-01-02 NOTE — Progress Notes (Signed)
CARDIAC REHAB PHASE I    Pt sitting in chair eating breakfast. Feeling well this morning and looking forward to home later today. No questions or concerns regarding education provided yesterday. Referral sent to [facility] for CRP2.   [id number] Woodroe Chen, RN BSN 01/02/2023 9:37 AM

## 2023-01-02 NOTE — Progress Notes (Signed)
Rounding Note    Patient Name: Paula Wong Date of Encounter: 01/02/2023  [facility] Cardiologist: Kristeen Miss, MD   Subjective   Feeling very good today.  And ready for discharge. Inpatient Medications    Scheduled Meds:  amiodarone  400 mg Oral BID   aspirin  81 mg Oral Daily   atorvastatin  80 mg Oral Daily   clopidogrel  75 mg Oral Daily   dapagliflozin propanediol  10 mg Oral Daily   enoxaparin (LOVENOX) injection  40 mg Subcutaneous Q24H   gabapentin  600 mg Oral BID   insulin glargine-yfgn  40 Units Subcutaneous QHS   linagliptin  5 mg Oral Daily   magnesium oxide  400 mg Oral Daily   metoprolol succinate  50 mg Oral BID   multivitamin with minerals  1 tablet Oral Daily   pantoprazole  20 mg Oral Daily   sacubitril-valsartan  1 tablet Oral BID   sodium chloride flush  3 mL Intravenous Q12H   sodium chloride flush  3 mL Intravenous Q12H   sodium chloride flush  3 mL Intravenous Q12H   spironolactone  12.5 mg Oral Daily   Continuous Infusions:  sodium chloride     PRN Meds: sodium chloride, acetaminophen, nitroGLYCERIN, ondansetron (ZOFRAN) IV, mouth rinse, sodium chloride flush   Vital Signs    Vitals:   01/01/23 0912 01/01/23 1431 01/01/23 2217 01/02/23 0440  BP: (!) 121/94 103/68 107/74 114/76  Pulse:   74 79  Resp: 16 16  18   Temp:    98.4 F (36.9 C)  TempSrc:    Oral  SpO2:    94%  Weight:    83.5 kg  Height:        Intake/Output Summary (Last 24 hours) at 01/02/2023 0827 Last data filed at 01/01/2023 1600 Gross per 24 hour  Intake 42.87 ml  Output --  Net 42.87 ml       01/02/2023    4:40 AM 01/01/2023    5:51 AM 12/31/2022    3:46 AM  Last 3 Weights  Weight (lbs) 184 lb 186 lb 3.2 oz 180 lb 3.2 oz  Weight (kg) 83.462 kg 84.46 kg 81.738 kg      Telemetry    Generally normal sinus rhythm with a heart rate in the 70s.  She had 7 beats of asymptomatic V. tach last night at around 2120.  It was reported that she also  had 15 beats of V. tach at 0131.  Both these episodes have been asymptomatic.- Personally Reviewed  ECG    Last EKG showed NSR nonspecific  STTW changes in lateral leads. Unchanged from prior. - Personally Reviewed  Physical Exam   GEN: No acute distress.   Neck: No JVD Cardiac: RRR, no murmurs, rubs, or gallops.  Respiratory: Clear to auscultation bilaterally. GI: Soft, nontender, non-distended  MS: No edema; No deformity. L radial cath site free of discharge, redness, tenderness.  Neuro:  Nonfocal  Psych: Normal affect   Labs    High Sensitivity Troponin:   Recent Labs  Lab 12/30/22 2236 12/31/22 0053  TROPONINIHS 10 12      Chemistry Recent Labs  Lab 12/30/22 1550 12/30/22 2236 12/31/22 0053 12/31/22 1844 01/01/23 0303 01/01/23 1514 01/02/23 0233  NA 142  --  142  --  140 139  --   K 3.6  --  4.0  --  3.4* 4.1  --   CL 108  --  106  --  103 106  --   CO2 24  --  24  --  27 23  --   GLUCOSE 109*  --  172*  --  96 121*  --   BUN 7*  --  8  --  11 13  --   CREATININE 1.01*   < > 0.92 0.98 0.99 1.05*  --   CALCIUM 8.8*  --  8.8*  --  8.9 9.1  --   MG  --   --  1.8  --  2.0  --  2.0  PROT 7.3  --   --   --   --  7.1  --   ALBUMIN 3.7  --   --   --   --  3.4*  --   AST 15  --   --   --   --  29  --   ALT 10  --   --   --   --  12  --   ALKPHOS 54  --   --   --   --  57  --   BILITOT 1.2  --   --   --   --  1.4*  --   GFRNONAA 57*   < > >60 59* 58* 54*  --   ANIONGAP 10  --  12  --  10 10  --    < > = values in this interval not displayed.     Lipids  Recent Labs  Lab 12/31/22 0053  CHOL 100  TRIG 101  HDL 39*  LDLCALC 41  CHOLHDL 2.6     Hematology Recent Labs  Lab 12/31/22 1844 01/01/23 0303 01/02/23 0233  WBC 7.9 5.8 4.7  RBC 5.30* 5.01 5.26*  HGB 13.9 13.1 13.9  HCT 44.6 41.3 43.3  MCV 84.2 82.4 82.3  MCH 26.2 26.1 26.4  MCHC 31.2 31.7 32.1  RDW 14.2 14.3 14.2  PLT 269 257 283    Thyroid  Recent Labs  Lab 01/01/23 0303  TSH  3.959    BNP Recent Labs  Lab 12/30/22 2236  BNP 1,847.2*     DDimer No results for input(s): "DDIMER" in the last 168 hours.   Radiology    CARDIAC CATHETERIZATION  Result Date: 12/31/2022   Mid Cx lesion is 5% stenosed.   Ost LAD to Prox LAD lesion is 40% stenosed.   Mid LAD-2 lesion is 30% stenosed.   Ost LM lesion is 40% stenosed.   Mid LM to Dist LM lesion is 30% stenosed.   RV Branch lesion is 70% stenosed.   Prox LAD to Mid LAD lesion is 85% stenosed.   Mid LAD-1 lesion is 85% stenosed.   A drug-eluting stent was successfully placed using a SYNERGY XD 3.0X24.   Post intervention, there is a 0% residual stenosis.   Post intervention, there is a 0% residual stenosis.   LV end diastolic pressure is moderately elevated. 2 vessel obstructive CAD with severe LAD stenosis sequentially with heavy calcification. Moderate RCA disease diffusely in a small branch Moderately elevated LVEDP 25 mm Hg Successful PCI of the mid LAD using IVUS guidance, Shockwave intracoronary lithotripsy and DES x 1. Plan: DAPT for one year. Anticipate possible DC tomorrow.    Cardiac Studies   Left heart catheterization 12/31/2022 Mid Cx lesion is 5% stenosed.   Ost LAD to Prox LAD lesion is 40% stenosed.   Mid LAD-2 lesion is 30% stenosed.   Ost LM lesion is  40% stenosed.   Mid LM to Dist LM lesion is 30% stenosed.   RV Branch lesion is 70% stenosed.   Prox LAD to Mid LAD lesion is 85% stenosed.   Mid LAD-1 lesion is 85% stenosed.   A drug-eluting stent was successfully placed using a SYNERGY XD 3.0X24.   Post intervention, there is a 0% residual stenosis.   Post intervention, there is a 0% residual stenosis.   LV end diastolic pressure is moderately elevated.   2 vessel obstructive CAD with severe LAD stenosis sequentially with heavy calcification. Moderate RCA disease diffusely in a small branch Moderately elevated LVEDP 25 mm Hg Successful PCI of the mid LAD using IVUS guidance, Shockwave intracoronary  lithotripsy and DES x 1.   Plan: DAPT for one year. Anticipate possible DC tomorrow.   Echocardiogram 12/06/2022  1. Left ventricular ejection fraction, by estimation, is 25 to 30%. The  left ventricle has severely decreased function. The left ventricle  demonstrates global hypokinesis. The left ventricular internal cavity size  was moderately dilated. Left  ventricular diastolic parameters are indeterminate.   2. Right ventricular systolic function is normal. The right ventricular  size is normal.   3. The mitral valve is normal in structure. Mild to moderate mitral valve  regurgitation. No evidence of mitral stenosis.   4. The aortic valve is tricuspid. There is mild calcification of the  aortic valve. Aortic valve regurgitation is mild. Aortic valve sclerosis  is present, with no evidence of aortic valve stenosis.   5. The inferior vena cava is normal in size with greater than 50%  respiratory variability, suggesting right atrial pressure of 3 mmHg.   Patient Profile     77 y.o. 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  Has recently underwent cardiac catheterization on 12/31/2022 with DES to LAD.  Assessment & Plan    Asymptomatic ventricular tachycardia on loop monitor Patient has been transitioned from IV amiodarone to oral medication.  Per Dr. Elberta Fortis she is to be on 400 mg for the next 2 weeks and then 200mg  BID for the next 2 weeks, then 200mg  daily, unless otherwise specified by follow-up provider.  Yesterday, late in the night she had 2 episodes of nonsustained V. tach for 7 and 15 beats.  Both occurrences she was asymptomatic. TSH 3.9 AST 29, ALT 12, alk phosphatase 57 Magnesium 2.0, potassium 4.1 EP following peripherally. She has  been advised not to drive until medically cleared by EP and was agreed verbally.  CAD status post DES to LAD Has had no anginal complaints and asymptomatic today throughout this admission.  She has been walking the halls and feels great. Underwent catheterization  that showed two-vessel obstructive CAD with severe LAD stenosis.  Moderate RCA disease.  Moderately elevated LVEDP 25 mmHg.  Underwent successful PCI to mid LAD.  Plan to continue DAPT therapy as below. Continue aspirin 81 mg daily and Plavix 75 daily for 12 months  Continue Atorvastatin 80 mg Patient ready for discharge.  Chronic HFrEF LVEF 25 to 30% on echocardiogram on 12/06/2022.  Appears euvolemic today. On maximized dosing of GDMT as blood pressure allows.  Continue Farxiga 10 mg, Toprol XL 50 mg, Entresto 49-51mg  twice daily, spironolactone 12.5 mg  Mild to moderate mitral valve regurgitation Continue to monitor.  CKD  stage II Stable. Appears around her baseline with a GFR around 60.  Hypokalemia  4.1 after supplementation  Hyperlipidemia Continue atorvastatin 80 mg. LDL 41. Lpa 109.5.  Type 2 diabetes mellitus Controlled with most recent hemoglobin A1c 6.3%. Okay to resume metformin tomorrow.    For questions or updates, please contact [facility] Please consult [email or web address] for contact info under      Signed, Abagail Kitchens, PA-C  01/02/2023, 8:27 AM

## 2023-01-02 NOTE — Discharge Summary (Signed)
Discharge Summary    Patient ID: Paula Wong MRN: [id number]; DOB: [birth year]  Admit date: 12/30/2022 Discharge date: 01/02/2023  PCP:  Loura Back, NP   [facility] Providers Cardiologist:  Kristeen Miss, MD   {  Discharge Diagnoses    Principal Problem:   Ventricular tachycardia [facility]) Active Problems:   Hypertension   Diabetes (HCC)   Hyperlipidemia   Chronic HFrEF (heart failure with reduced ejection fraction) (HCC)   Mitral regurgitation   CKD (chronic kidney disease) stage 2, GFR 60-89 ml/min   CAD (coronary artery disease)    Diagnostic Studies/Procedures    Left heart catheterization 12/31/2022 Mid Cx lesion is 5% stenosed.   Ost LAD to Prox LAD lesion is 40% stenosed.   Mid LAD-2 lesion is 30% stenosed.   Ost LM lesion is 40% stenosed.   Mid LM to Dist LM lesion is 30% stenosed.   RV Branch lesion is 70% stenosed.   Prox LAD to Mid LAD lesion is 85% stenosed.   Mid LAD-1 lesion is 85% stenosed.   A drug-eluting stent was successfully placed using a SYNERGY XD 3.0X24.   Post intervention, there is a 0% residual stenosis.   Post intervention, there is a 0% residual stenosis.   LV end diastolic pressure is moderately elevated.   2 vessel obstructive CAD with severe LAD stenosis sequentially with heavy calcification. Moderate RCA disease diffusely in a small branch Moderately elevated LVEDP 25 mm Hg Successful PCI of the mid LAD using IVUS guidance, Shockwave intracoronary lithotripsy and DES x 1.   Plan: DAPT for one year. Anticipate possible DC tomorrow.    Echocardiogram 12/06/2022  1. Left ventricular ejection fraction, by estimation, is 25 to 30%. The  left ventricle has severely decreased function. The left ventricle  demonstrates global hypokinesis. The left ventricular internal cavity size  was moderately dilated. Left  ventricular diastolic parameters are indeterminate.   2. Right ventricular systolic function is normal. The right  ventricular  size is normal.   3. The mitral valve is normal in structure. Mild to moderate mitral valve  regurgitation. No evidence of mitral stenosis.   4. The aortic valve is tricuspid. There is mild calcification of the  aortic valve. Aortic valve regurgitation is mild. Aortic valve sclerosis  is present, with no evidence of aortic valve stenosis.   5. The inferior vena cava is normal in size with greater than 50%  respiratory variability, suggesting right atrial pressure of 3 mmHg.  _____________   History of Present Illness     Paula Wong is a 77 y.o. female with history of multivessel CAD more significant in LAD, chronic HFrEF, hyperlipidemia, hypertension, CVA August 2023.  Patient has had known severe left ventricular dysfunction of 25 to 30% by echocardiogram in December 06, 2022 and with history of multivessel coronary artery disease more significant in the LAD territory. Prior cath 03/2022 showed 40% ostial left main stenosis, 40% ostial LAD stenosis, 80% mid LAD stenosis, stenoses involving the origins of 2 large diagonal arteries, 70% RV branch stenosis. This was managed medically, reserving PCI for progressive symptoms. It was felt that cardiomyopathy was out of proportion to her CAD at the time. Despite patient's multivessel coronary artery disease she has not had anginal symptoms but has had exertional dyspnea. She also has history of remote stroke in August 2023 in which she has had a implantable loop recorder and been on Plavix.  Results from her loop recorder showed an episode of sustained polymorphic  ventricular tachycardia that occurred on May 11.  This episode lasted for about 23 seconds with spontaneous termination followed by a pause of about 3 seconds followed by an idioventricular beat and a sinus beat and another 3-second pause before sinus rhythm was gradually reestablished. Throughout this episode she has been asymptomatic. The office contacted the patient and advised she  proceed to the ED.  It was felt that her polymorphic ventricular tachycardia was consistent with an ischemic etiology so she eventually underwent cardiac catheterization for further evaluation.   Hospital Course     Consultants:    Asymptomatic ventricular tachycardia on loop monitor Started on IV amiodarone upon admission. Possibly felt due ischemic etiology given LAD stenosis below.Patient has been transitioned from IV amiodarone to oral medication.  Per Dr. Elberta Fortis she is to be on 400 mg for the next 2 weeks and then 200mg  BID for the next 2 weeks, then 200mg  daily, unless otherwise specified by follow-up provider.  Yesterday, late in the night she had 2 episodes of nonsustained V. tach for 7 and 15 beats.  Both occurrences she was asymptomatic. TSH 3.9 AST 29, ALT 12, alk phosphatase 57 Magnesium wnl, potassium mildly decreased at times requiring repletion -> started on Kcl daily at discharge *recommended to get BMET at f/u appt, added to appt notes* EP following peripherally, to follow up as OP - Dr. Elberta Fortis felt that given PCI, would hold off ICD for now She has been advised not to drive until medically cleared by EP and was agreed verbally.   CAD status post DES to LAD Has had no anginal complaints and asymptomatic today and throughout this admission.  She has been walking the halls and feels great. Underwent catheterization  that showed two-vessel obstructive CAD with severe LAD stenosis.  Moderate RCA disease.  Moderately elevated LVEDP 25 mmHg.  Underwent successful PCI to mid LAD.  Plan to continue DAPT therapy as below. Continue aspirin 81 mg daily and Plavix 75 daily for 12 months - refills sent in to [facility] pharmacy Continue Atorvastatin 80 mg   Chronic HFrEF LVEF 25 to 30% on echocardiogram on 12/06/2022.  Appears euvolemic today does not struggle with voiding issues.. On maximized dosing of GDMT as blood pressure allows.  Continue Farxiga 10 mg, Toprol XL 50 mg, Entresto  49-51mg  twice daily, spironolactone 12.5 mg   Mild to moderate mitral valve regurgitation Continue to monitor.   CKD stage II Stable. Appears around her baseline with a GFR around 60.   Hypokalemia  As above   Hyperlipidemia Continue atorvastatin 80 mg. LDL 41. Lpa 109.5.   Type 2 diabetes mellitus Controlled with most recent hemoglobin A1c 6.3%. Okay to resume metformin tomorrow.   Patient has been seen by myself and Dr. [place] and deemed stable for discharge.  Cath site has been evaluated without any obvious signs of infection.  She will follow-up with Dr. Elease Hashimoto and I will send a message to the electrophysiologist office to get patient scheduled.  Did the patient have an acute coronary syndrome (MI, NSTEMI, STEMI, etc) this admission?:  No                               Did the patient have a percutaneous coronary intervention (stent / angioplasty)?:  Yes.     Cath/PCI Registry Performance & Quality Measures: Aspirin prescribed? - Yes ADP Receptor Inhibitor (Plavix/Clopidogrel, Brilinta/Ticagrelor or Effient/Prasugrel) prescribed (includes medically managed patients)? - Yes  High Intensity Statin (Lipitor 40-80mg  or Crestor 20-40mg ) prescribed? - Yes For EF <40%, was ACEI/ARB prescribed? - Yes For EF <40%, Aldosterone Antagonist (Spironolactone or Eplerenone) prescribed? - Yes Cardiac Rehab Phase II ordered? - Yes       The patient will be scheduled for a TOC follow up appointment within 14 days.  A message has been sent to the [facility] and at the office where the patient should be seen for follow up.  _____________  Discharge Vitals Blood pressure 103/79, pulse 71, temperature 98.2 F (36.8 C), temperature source Oral, resp. rate (!) 22, height 5\' 5"  (1.651 m), weight 83.5 kg, SpO2 95 %.  Filed Weights   12/31/22 0346 01/01/23 0551 01/02/23 0440  Weight: 81.7 kg 84.5 kg 83.5 kg    Labs & Radiologic Studies    CBC Recent Labs    12/30/22 1550 12/30/22 2236  01/01/23 0303 01/02/23 0233  WBC 5.6   < > 5.8 4.7  NEUTROABS 3.2  --   --   --   HGB 13.5   < > 13.1 13.9  HCT 44.3   < > 41.3 43.3  MCV 84.9   < > 82.4 82.3  PLT 283   < > 257 283   < > = values in this interval not displayed.   Basic Metabolic Panel Recent Labs    16/10/96 0303 01/01/23 1514 01/02/23 0233  NA 140 139  --   K 3.4* 4.1  --   CL 103 106  --   CO2 27 23  --   GLUCOSE 96 121*  --   BUN 11 13  --   CREATININE 0.99 1.05*  --   CALCIUM 8.9 9.1  --   MG 2.0  --  2.0   Liver Function Tests Recent Labs    12/30/22 1550 01/01/23 1514  AST 15 29  ALT 10 12  ALKPHOS 54 57  BILITOT 1.2 1.4*  PROT 7.3 7.1  ALBUMIN 3.7 3.4*   No results for input(s): "LIPASE", "AMYLASE" in the last 72 hours. High Sensitivity Troponin:   Recent Labs  Lab 12/30/22 2236 12/31/22 0053  TROPONINIHS 10 12    BNP Invalid input(s): "POCBNP" D-Dimer No results for input(s): "DDIMER" in the last 72 hours. Hemoglobin A1C No results for input(s): "HGBA1C" in the last 72 hours. Fasting Lipid Panel Recent Labs    12/31/22 0053  CHOL 100  HDL 39*  LDLCALC 41  TRIG 045  CHOLHDL 2.6   Thyroid Function Tests Recent Labs    01/01/23 0303  TSH 3.959   _____________  CARDIAC CATHETERIZATION  Result Date: 12/31/2022   Mid Cx lesion is 5% stenosed.   Ost LAD to Prox LAD lesion is 40% stenosed.   Mid LAD-2 lesion is 30% stenosed.   Ost LM lesion is 40% stenosed.   Mid LM to Dist LM lesion is 30% stenosed.   RV Branch lesion is 70% stenosed.   Prox LAD to Mid LAD lesion is 85% stenosed.   Mid LAD-1 lesion is 85% stenosed.   A drug-eluting stent was successfully placed using a SYNERGY XD 3.0X24.   Post intervention, there is a 0% residual stenosis.   Post intervention, there is a 0% residual stenosis.   LV end diastolic pressure is moderately elevated. 2 vessel obstructive CAD with severe LAD stenosis sequentially with heavy calcification. Moderate RCA disease diffusely in a small  branch Moderately elevated LVEDP 25 mm Hg Successful PCI of the mid LAD  using IVUS guidance, Shockwave intracoronary lithotripsy and DES x 1. Plan: DAPT for one year. Anticipate possible DC tomorrow.   DG Chest 2 View  Result Date: 12/30/2022 CLINICAL DATA:  Palpitations EXAM: CHEST - 2 VIEW COMPARISON:  CT done on 04/15/2022 FINDINGS: Transverse diameter of heart is increased. Central pulmonary vessels are prominent. There is an implantable cardiac monitoring device in left anterior chest wall. There is slight prominence of interstitial markings in the parahilar regions and lower lung fields. Small bilateral pleural effusions are seen. There is no pneumothorax. IMPRESSION: Cardiomegaly. Central pulmonary vessels are prominent suggesting CHF. Small bilateral pleural effusions. Electronically Signed   By: Ernie Avena M.D.   On: 12/30/2022 16:10   CUP PACEART REMOTE DEVICE CHECK  Result Date: 12/26/2022 ILR summary report received. Battery status OK. Normal device function. No new symptom, tachy, brady, or pause episodes. No new AF episodes. Monthly summary reports and ROV/PRN LA, CVRS  ECHOCARDIOGRAM COMPLETE  Result Date: 12/06/2022    ECHOCARDIOGRAM REPORT   Patient Name:   Paula Wong Date of Exam: 12/06/2022 Medical Rec #:  [id number]        Height:       65.0 in Accession #:    [id number]       Weight:       183.8 lb Date of Birth:  22-Mar-1946         BSA:          1.908 m Patient Age:    77 years         BP:           132/88 mmHg Patient Gender: F                HR:           89 bpm. Exam Location:  [address] Procedure: 2D Echo, Color Doppler and Cardiac Doppler Indications:    I50.42 CHF  History:        Patient has prior history of Echocardiogram examinations, most                 recent 04/13/2022. CHF and Chronic combine CHF, CAD and                 CAD-moderate CAD; Risk Factors:Diabetes, Hypertension,                 Dyslipidemia and Former Smoker. Previous echo revealed LVEF 25%,                  mild LVH, global hypokinesis.  Sonographer:    Chanetta Marshall BA, RDCS Referring Phys: [phone] PHILIP J NAHSER IMPRESSIONS  1. Left ventricular ejection fraction, by estimation, is 25 to 30%. The left ventricle has severely decreased function. The left ventricle demonstrates global hypokinesis. The left ventricular internal cavity size was moderately dilated. Left ventricular diastolic parameters are indeterminate.  2. Right ventricular systolic function is normal. The right ventricular size is normal.  3. The mitral valve is normal in structure. Mild to moderate mitral valve regurgitation. No evidence of mitral stenosis.  4. The aortic valve is tricuspid. There is mild calcification of the aortic valve. Aortic valve regurgitation is mild. Aortic valve sclerosis is present, with no evidence of aortic valve stenosis.  5. The inferior vena cava is normal in size with greater than 50% respiratory variability, suggesting right atrial pressure of 3 mmHg. FINDINGS  Left Ventricle: Left ventricular ejection fraction, by estimation, is 25 to 30%. The left ventricle  has severely decreased function. The left ventricle demonstrates global hypokinesis. The left ventricular internal cavity size was moderately dilated. There is no left ventricular hypertrophy. Left ventricular diastolic parameters are indeterminate.  LV Wall Scoring: The inferior wall is akinetic. Right Ventricle: The right ventricular size is normal. No increase in right ventricular wall thickness. Right ventricular systolic function is normal. Left Atrium: Left atrial size was normal in size. Right Atrium: Right atrial size was normal in size. Pericardium: There is no evidence of pericardial effusion. Mitral Valve: The mitral valve is normal in structure. Mild to moderate mitral valve regurgitation. No evidence of mitral valve stenosis. Tricuspid Valve: The tricuspid valve is normal in structure. Tricuspid valve regurgitation is not demonstrated. No  evidence of tricuspid stenosis. Aortic Valve: The aortic valve is tricuspid. There is mild calcification of the aortic valve. Aortic valve regurgitation is mild. Aortic valve sclerosis is present, with no evidence of aortic valve stenosis. Pulmonic Valve: The pulmonic valve was normal in structure. Pulmonic valve regurgitation is not visualized. No evidence of pulmonic stenosis. Aorta: The aortic root is normal in size and structure. Venous: The inferior vena cava is normal in size with greater than 50% respiratory variability, suggesting right atrial pressure of 3 mmHg. IAS/Shunts: No atrial level shunt detected by color flow Doppler.  LEFT VENTRICLE PLAX 2D LVIDd:         6.40 cm LVIDs:         5.70 cm LV PW:         1.10 cm LV IVS:        0.80 cm LVOT diam:     2.40 cm LV SV:         40 LV SV Index:   21 LVOT Area:     4.52 cm  RIGHT VENTRICLE             IVC RV Basal diam:  2.90 cm     IVC diam: 1.05 cm RV Mid diam:    0.90 cm RV S prime:     11.75 cm/s TAPSE (M-mode): 1.2 cm LEFT ATRIUM             Index        RIGHT ATRIUM          Index LA diam:        4.00 cm 2.10 cm/m   RA Area:     9.20 cm LA Vol (A2C):   40.9 ml 21.43 ml/m  RA Volume:   20.20 ml 10.58 ml/m LA Vol (A4C):   36.7 ml 19.23 ml/m LA Biplane Vol: 38.5 ml 20.17 ml/m  AORTIC VALVE LVOT Vmax:   56.27 cm/s LVOT Vmean:  35.433 cm/s LVOT VTI:    0.089 m  AORTA Ao Root diam: 3.10 cm Ao Asc diam:  3.60 cm  SHUNTS Systemic VTI:  0.09 m Systemic Diam: 2.40 cm Donato Schultz MD Electronically signed by Donato Schultz MD Signature Date/Time: 12/06/2022/1:50:10 PM    Final    Disposition   Pt is being discharged home today in good condition.  Follow-up Plans & Appointments     Follow-up Information     Nahser, Deloris Ping, MD Follow up.   Specialty: Cardiology Why: Your appointment is on 01/06/2023 at 4:00. Please be atleast 15 min early before your appointment time.  The office will also reach out to you to schedule appt with electrophysiology  team. Contact information: [address] [place] [address] [phone]  Discharge Instructions     Amb Referral to Cardiac Rehabilitation   Complete by: As directed    Diagnosis: Coronary Stents   After initial evaluation and assessments completed: Virtual Based Care may be provided alone or in conjunction with Phase 2 Cardiac Rehab based on patient barriers.: Yes   Intensive Cardiac Rehabilitation (ICR) [facility] location only OR Traditional Cardiac Rehabilitation (TCR) *If criteria for ICR are not met will enroll in TCR [facility] only): Yes   Diet - low sodium heart healthy   Complete by: As directed    Discharge instructions   Complete by: As directed    !!! IMPORTANT: After a heart catheterization, you will need to avoid taking metformin for at least 48 hours. We would recommend you restart this tomorrow on 01/02/2023.  Your medicine list includes a medicine called linagliptin (Tradjenta) but the pharmacy notes mention they had no record of this prescription. Please contact your diabetes doctor to ensure you are taking your medicine regimen as intended.   Increase activity slowly   Complete by: As directed    No driving for until cleared by your electrophysiology team. No lifting over 5 lbs for 1 week. No sexual activity for 1 week. Keep procedure site clean & dry. If you notice increased pain, swelling, bleeding or pus, call/return!  You may shower, but no soaking baths/hot tubs/pools for 1 week.        Discharge Medications   Allergies as of 01/02/2023   No Known Allergies      Medication List     STOP taking these medications    linagliptin 5 MG Tabs tablet Commonly known as: TRADJENTA       TAKE these medications    amiodarone 200 MG tablet Commonly known as: PACERONE Take 2 tablets (400 mg total) by mouth twice daily for 2 weeks, then decrease dose to 1 tablet (200 mg total) twice daily for 2 weeks, then decrease dose to 1  tablet (200 mg) once daily.   aspirin 81 MG chewable tablet Chew 1 tablet (81 mg total) by mouth daily. Start taking on: Jan 03, 2023   atorvastatin 80 MG tablet Commonly known as: LIPITOR Take 80 mg by mouth daily.   clopidogrel 75 MG tablet Commonly known as: PLAVIX Take 1 tablet (75 mg total) by mouth daily.   Entresto 49-51 MG Generic drug: sacubitril-valsartan Take 1 tablet by mouth 2 (two) times daily.   Farxiga 10 MG Tabs tablet Generic drug: dapagliflozin propanediol Take 10 mg by mouth daily.   gabapentin 600 MG tablet Commonly known as: NEURONTIN Take 600 mg by mouth 2 (two) times daily.   magnesium oxide 400 MG tablet Commonly known as: MAG-OX Take 400 mg by mouth daily.   metFORMIN 500 MG 24 hr tablet Commonly known as: GLUCOPHAGE-XR Take 500 mg by mouth daily with breakfast. Notes to patient: !!! IMPORTANT: After a heart catheterization, you will need to avoid taking metformin for at least 48 hours. We would recommend you restart this tomorrow on 01/02/2023.   metoprolol succinate 50 MG 24 hr tablet Commonly known as: TOPROL-XL Take 50 mg by mouth 2 (two) times daily.   multivitamin tablet Take 1 tablet by mouth daily.   nitroGLYCERIN 0.4 MG SL tablet Commonly known as: NITROSTAT Place 1 tablet (0.4 mg total) under the tongue every 5 (five) minutes x 3 doses as needed for chest pain.   Ozempic (1 MG/DOSE) 4 MG/3ML Sopn Generic drug: Semaglutide (1 MG/DOSE) Inject 1 mg into the  skin once a week.   pantoprazole 20 MG tablet Commonly known as: PROTONIX Take 1 tablet (20 mg total) by mouth daily.   spironolactone 25 MG tablet Commonly known as: ALDACTONE Take 12.5 mg by mouth daily.   Evaristo Bury FlexTouch 200 UNIT/ML FlexTouch Pen Generic drug: insulin degludec Take 80 Units by mouth at bedtime.           Outstanding Labs/Studies   Duration of Discharge Encounter   Greater than 30 minutes including physician time.  Signed, Abagail Kitchens,  PA-C 01/02/2023, 12:34 PM

## 2023-01-05 ENCOUNTER — Encounter: Payer: Self-pay | Admitting: Cardiovascular Disease

## 2023-01-05 NOTE — Progress Notes (Signed)
Cardiology Office Note:    Date:  01/06/2023   ID:  Paula Wong, DOB [birth year], MRN [id number]  PCP:  Loura Back, NP    [facility] Providers Cardiologist:  Micajah Dennin  EP:  Lalla Brothers   Referring MD: Loura Back, NP   Chief Complaint  Patient presents with   Congestive Heart Failure         History of Present Illness:    Paula Wong is a 77 y.o. female with a hx of CVA , CHF I met her during a recent hospitalization for her stroke   Coronary artery calcium score is 2553 RCA - mild - mod  CAD LM - moderate CAD LAD - mod disease in 2 large diags,  severe disease in mid vessel and distal vessel  Ramus :  proximal severe disease  LCx: moderate CAD  Cath on Aug. 29, 2023:   mod - severe CAD . Her LV dysfunction seemed out of proportion to her degree of CAD .  If she develops symptoms of CP, can consider shockwave therapy or CSI atherectomy    Jan. 19, 2024  Paula Wong is seen today for follow up of her hx of CVA, CHF Has moderate CAD.  Her LV dysfunction seems to be out of proportion to her degree of her CAD .  Echocardiogram in August, 2023 reveals EF of 20 to 25%.  Current medications include  Entresto 24-26 twice a day Metoprolol succinate 50 mg twice a day Farxiga 10 mg a day Aldactone 12.5 mg a day  HR and BP are well controlled.  Not getting any exercise    December 10, 2022 Seen for follow-up of her coronary artery disease and CVA.  She is here for preoperative visit.  She has a sebaceous cyst on her left side of her neck.  She is on plavix   She is at low risk for her upcoming cyst  removal.  She needs clearance to stop plavix She may hold her Plavix for 5 days prior to procedure.  She should restart plavix as soon as it is safe from a surgical standpoint    Jan 06, 2023 Paula Wong is seen for follow up of her CHF, CAD She was admmitted to the hospital in Dec 30, 2022 with sustained VT She had no apparent symptoms but the VT was picked up  on her implantable loop recorder.    HR as high as 180, polymorphic ,  lasted 23 seconds, spontaneously terminated  Cath showed a tight mid LAD stenosis which was successfully stented ( introcoronary lithotripsy and DES)   Echocardiogram from December 06, 2022 reveals severe LV dysfunction with EF of 25 to 30%.  More short of breath than usual   Avoid salty foods  Having difficulty sleeping . No CP   She is very short of breath today O2 sats range from 77-88 while sitting in the wheelchair  She was not this short of breath when she was discharged. Started 2-3 days later   No CP   No pleuretic cp  Few basilar rales  Is not on lasix     Past Medical History:  Diagnosis Date   Allergy    CAD (coronary artery disease)    a. 03/2022 Cath: LM 40ost, 3m, LAD 40ost/p, 45m, 67m, LCX 42m, RCA nl, RV branch 70-->Med rx.   Chronic HFrEF (heart failure with reduced ejection fraction) (HCC)    a. 11/2021 Echo: EF 20-25%; b. 03/2022 Echo: EF 20-25%; c. 11/2022 Echo: EF 25-30%, glob  HK.   Diabetes (HCC)    Diverticulosis    GERD (gastroesophageal reflux disease)    Hiatal hernia    Hyperlipidemia    Hypertension    Implantable loop recorder present    a. 03/2022 s/p Abbott IQ EL+ (serial [id number]) ILR in setting of cryptogenic stroke.   Ischemic cardiomyopathy    a. 11/2021 Echo: EF 20-25%; b. 03/2022 Echo: EF 20-25%; c. 11/2022 Echo: EF 25-30%, glob HK, nl RV fxn, mild-mod MR, mild AI, AoV sclerosis.   Neuropathy    feet   Schatzki's ring    Stroke [facility])    a. 03/2022 MRI Brain: subacute cortical infarct involving inf and med L occipial pole w/ petechial hemorrhage; b. 03/2022 s/p Abbott Assert IQ EL ILR.   Ventricular tachycardia (HCC)    a. 12/2022 noted on ILR.    Past Surgical History:  Procedure Laterality Date   CORONARY LITHOTRIPSY N/A 12/31/2022   Procedure: CORONARY LITHOTRIPSY;  Surgeon: Swaziland, Peter M, MD;  Location: [facility] INVASIVE CV LAB;  Service: Cardiovascular;  Laterality: N/A;    CORONARY STENT INTERVENTION N/A 12/31/2022   Procedure: CORONARY STENT INTERVENTION;  Surgeon: Swaziland, Peter M, MD;  Location: [facility] INVASIVE CV LAB;  Service: Cardiovascular;  Laterality: N/A;   CORONARY ULTRASOUND/IVUS N/A 12/31/2022   Procedure: Coronary Ultrasound/IVUS;  Surgeon: Swaziland, Peter M, MD;  Location: [facility] INVASIVE CV LAB;  Service: Cardiovascular;  Laterality: N/A;   LEFT HEART CATH AND CORONARY ANGIOGRAPHY N/A 04/16/2022   Procedure: LEFT HEART CATH AND CORONARY ANGIOGRAPHY;  Surgeon: Lennette Bihari, MD;  Location: [facility] INVASIVE CV LAB;  Service: Cardiovascular;  Laterality: N/A;   LEFT HEART CATH AND CORONARY ANGIOGRAPHY N/A 12/31/2022   Procedure: LEFT HEART CATH AND CORONARY ANGIOGRAPHY;  Surgeon: Swaziland, Peter M, MD;  Location: [facility] INVASIVE CV LAB;  Service: Cardiovascular;  Laterality: N/A;   LOOP RECORDER INSERTION N/A 04/15/2022   Procedure: LOOP RECORDER INSERTION;  Surgeon: Lanier Prude, MD;  Location: [facility] INVASIVE CV LAB;  Service: Cardiovascular;  Laterality: N/A;   MASS EXCISION Right 06/07/2019   Procedure: Dawson Bills OF KELOID TO RIGHT NECK;  Surgeon: Louisa Second, MD;  Location: [facility];  Service: Plastics;  Laterality: Right;   TOOTH EXTRACTION  2013    Current Medications: Current Meds  Medication Sig   amiodarone (PACERONE) 200 MG tablet Take 2 tablets (400 mg total) by mouth 2 (two) times daily for 14 days, THEN 1 tablet (200 mg total) 2 (two) times daily for 14 days, THEN 1 tablet (200 mg total) daily thereafter   aspirin 81 MG chewable tablet Chew 1 tablet (81 mg total) by mouth daily.   atorvastatin (LIPITOR) 80 MG tablet Take 80 mg by mouth daily.   clopidogrel (PLAVIX) 75 MG tablet Take 1 tablet (75 mg total) by mouth daily.   FARXIGA 10 MG TABS tablet Take 10 mg by mouth daily.   gabapentin (NEURONTIN) 600 MG tablet Take 600 mg by mouth 2 (two) times daily.   magnesium oxide (MAG-OX) 400 MG tablet Take 400 mg by mouth daily.    metFORMIN (GLUCOPHAGE-XR) 500 MG 24 hr tablet Take 500 mg by mouth daily with breakfast.   metoprolol succinate (TOPROL-XL) 50 MG 24 hr tablet Take 50 mg by mouth 2 (two) times daily.   Multiple Vitamin (MULTIVITAMIN) tablet Take 1 tablet by mouth daily.   nitroGLYCERIN (NITROSTAT) 0.4 MG SL tablet Place 1 tablet (0.4 mg total) under the tongue every 5 (five) minutes x 3 doses  as needed for chest pain.   OZEMPIC, 1 MG/DOSE, 4 MG/3ML SOPN Inject 1 mg into the skin once a week.   pantoprazole (PROTONIX) 20 MG tablet Take 1 tablet (20 mg total) by mouth daily.   potassium chloride (KLOR-CON M) 10 MEQ tablet Take 1 tablet (10 mEq total) by mouth daily.   sacubitril-valsartan (ENTRESTO) 49-51 MG Take 1 tablet by mouth 2 (two) times daily.   spironolactone (ALDACTONE) 25 MG tablet Take 12.5 mg by mouth daily.   TRESIBA FLEXTOUCH 200 UNIT/ML SOPN Take 80 Units by mouth at bedtime.      Allergies:   Patient has no known allergies.   Social History   Socioeconomic History   Marital status: Single    Spouse name: Not on file   Number of children: Not on file   Years of education: Not on file   Highest education level: Not on file  Occupational History   Occupation: Retired  Tobacco Use   Smoking status: Former    Types: Cigarettes    Quit date: 08/20/2003    Years since quitting: 19.3   Smokeless tobacco: Never  Vaping Use   Vaping Use: Never used  Substance and Sexual Activity   Alcohol use: No   Drug use: No   Sexual activity: Not on file  Other Topics Concern   Not on file  Social History Narrative   Not on file   Social Determinants of Health   Financial Resource Strain: Not on file  Food Insecurity: No Food Insecurity (12/30/2022)   Hunger Vital Sign    Worried About Running Out of Food in the Last Year: Never true    Ran Out of Food in the Last Year: Never true  Transportation Needs: No Transportation Needs (12/30/2022)   PRAPARE - Administrator, Civil Service  (Medical): No    Lack of Transportation (Non-Medical): No  Physical Activity: Not on file  Stress: Not on file  Social Connections: Not on file     Family History: The patient's family history includes Cancer in her sister; Colon cancer (age of onset: 31) in her mother; Heart attack in her brother and father. There is no history of Stomach cancer, Pancreatic cancer, or Esophageal cancer.  ROS:   Please see the history of present illness.     All other systems reviewed and are negative.  EKGs/Labs/Other Studies Reviewed:    The following studies were reviewed today:    Recent Labs: 12/30/2022: B Natriuretic Peptide 1,847.2 01/01/2023: ALT 12; TSH 3.959 01/02/2023: BUN 12; Creatinine, Ser 1.07; Hemoglobin 13.9; Magnesium 2.0; Platelets 283; Potassium 3.9; Sodium 143  Recent Lipid Panel    Component Value Date/Time   CHOL 100 12/31/2022 0053   TRIG 101 12/31/2022 0053   HDL 39 (L) 12/31/2022 0053   CHOLHDL 2.6 12/31/2022 0053   VLDL 20 12/31/2022 0053   LDLCALC 41 12/31/2022 0053   LDLCALC 90 10/02/2020 1100     Risk Assessment/Calculations:      Physical Exam: Blood pressure (!) 140/80, pulse 62, height 5\' 5"  (1.651 m), weight 181 lb 11.2 oz (82.4 kg), SpO2 (!) 82 %.  HYPERTENSION CONTROL Vitals:   01/06/23 1542 01/06/23 1615  BP: (!) 148/82 (!) 140/80    The patient's blood pressure is elevated above target today.  In order to address the patient's elevated BP: A new medication was prescribed today.       GEN:  elderly female, in moderate respiratory distress  NECK: No  JVD; No carotid bruits LYMPHATICS: No lymphadenopathy CARDIAC: RRR , no murmurs, rubs, gallops RESPIRATORY:  Clear to auscultation without rales, wheezing or rhonchi  ABDOMEN: Soft, non-tender, non-distended MUSCULOSKELETAL:  No edema; No deformity  SKIN: Warm and dry NEUROLOGIC:  Alert and oriented x 3   EKG:    ASSESSMENT:    No diagnosis found.    PLAN:      Ventricular  tachycardia: She was diagnosed as having ventricular tachycardia on an implantable loop.  She was called last week and was admitted to the hospital.  Workup included a heart catheterization with subsequent stenting of her left anterior descending artery.      2.  Chronic combined CHF:   Her ejection fraction is 25 to 30%.  It is quite possible that she is going into transient congestive heart failure.  3.   CAD :  has moderate CAD of several branch vessels.  The stenosis are calcified.    4.  Hypoxemia: Patient presents with severe hypoxemia over the past several days.  She was not this short of breath when she left the hospital.  Possible etiologies include acute worsening of her congestive heart failure, acute coronary artery syndrome, pulmonary embolus.  She is not tachycardic and has no pleuritic chest pain.  I doubt that this was a pulmonary embolus.  She has not been eating a lot of salt.  She is not on Lasix but is on Entresto and spironolactone.  I suspect that she may need some IV Lasix.  At this point in the afternoon it is too late to try to diurese her and get her home oxygen.  I think that she needs to be readmitted to the hospital.  She becomes severely hypoxemic with the slightest bit of exercise such as getting up from her wheelchair to transfer.  I do not think it will be safe for her to return to home.   Will call EMS for transfer to the ER for further evaluation and admission .   She will need labs , xrays , work up for her hypoxemia .   Likely to need admission for a day or so .       Medication Adjustments/Labs and Tests Ordered: Current medicines are reviewed at length with the patient today.  Concerns regarding medicines are outlined above.  No orders of the defined types were placed in this encounter.  No orders of the defined types were placed in this encounter.    There are no Patient Instructions on file for this visit.   Signed, Kristeen Miss, MD   01/06/2023 5:23 PM    [facility]

## 2023-01-06 ENCOUNTER — Other Ambulatory Visit: Payer: Self-pay

## 2023-01-06 ENCOUNTER — Inpatient Hospital Stay (HOSPITAL_COMMUNITY)
Admission: EM | Admit: 2023-01-06 | Discharge: 2023-01-08 | DRG: 291 | Disposition: A | Discharge status: Home / Self Care | Payer: 59 | Source: Ambulatory Visit | Attending: Cardiovascular Disease | Admitting: Cardiovascular Disease

## 2023-01-06 ENCOUNTER — Ambulatory Visit: Payer: 59 | Attending: Cardiovascular Disease | Admitting: Cardiovascular Disease

## 2023-01-06 ENCOUNTER — Emergency Department (HOSPITAL_COMMUNITY): Payer: 59

## 2023-01-06 ENCOUNTER — Encounter: Payer: Self-pay | Admitting: Cardiovascular Disease

## 2023-01-06 ENCOUNTER — Encounter (HOSPITAL_COMMUNITY): Payer: Self-pay

## 2023-01-06 VITALS — BP 140/80 | HR 62 | Ht 65.0 in | Wt 181.7 lb

## 2023-01-06 DIAGNOSIS — Z7985 Long-term (current) use of injectable non-insulin antidiabetic drugs: Secondary | ICD-10-CM | POA: Diagnosis not present

## 2023-01-06 DIAGNOSIS — I255 Ischemic cardiomyopathy: Secondary | ICD-10-CM | POA: Diagnosis present

## 2023-01-06 DIAGNOSIS — Z87891 Personal history of nicotine dependence: Secondary | ICD-10-CM | POA: Diagnosis not present

## 2023-01-06 DIAGNOSIS — I5023 Acute on chronic systolic (congestive) heart failure: Secondary | ICD-10-CM | POA: Diagnosis present

## 2023-01-06 DIAGNOSIS — I2489 Other forms of acute ischemic heart disease: Secondary | ICD-10-CM | POA: Diagnosis present

## 2023-01-06 DIAGNOSIS — K219 Gastro-esophageal reflux disease without esophagitis: Secondary | ICD-10-CM | POA: Diagnosis present

## 2023-01-06 DIAGNOSIS — R0902 Hypoxemia: Secondary | ICD-10-CM | POA: Diagnosis not present

## 2023-01-06 DIAGNOSIS — I25118 Atherosclerotic heart disease of native coronary artery with other forms of angina pectoris: Secondary | ICD-10-CM

## 2023-01-06 DIAGNOSIS — Z8673 Personal history of transient ischemic attack (TIA), and cerebral infarction without residual deficits: Secondary | ICD-10-CM

## 2023-01-06 DIAGNOSIS — R0602 Shortness of breath: Principal | ICD-10-CM

## 2023-01-06 DIAGNOSIS — I110 Hypertensive heart disease with heart failure: Secondary | ICD-10-CM | POA: Diagnosis present

## 2023-01-06 DIAGNOSIS — I10 Essential (primary) hypertension: Secondary | ICD-10-CM | POA: Diagnosis present

## 2023-01-06 DIAGNOSIS — Z8249 Family history of ischemic heart disease and other diseases of the circulatory system: Secondary | ICD-10-CM | POA: Diagnosis not present

## 2023-01-06 DIAGNOSIS — I5043 Acute on chronic combined systolic (congestive) and diastolic (congestive) heart failure: Secondary | ICD-10-CM

## 2023-01-06 DIAGNOSIS — E785 Hyperlipidemia, unspecified: Secondary | ICD-10-CM | POA: Diagnosis present

## 2023-01-06 DIAGNOSIS — R6881 Early satiety: Secondary | ICD-10-CM | POA: Diagnosis present

## 2023-01-06 DIAGNOSIS — Z7982 Long term (current) use of aspirin: Secondary | ICD-10-CM

## 2023-01-06 DIAGNOSIS — I472 Ventricular tachycardia, unspecified: Secondary | ICD-10-CM | POA: Diagnosis present

## 2023-01-06 DIAGNOSIS — E119 Type 2 diabetes mellitus without complications: Secondary | ICD-10-CM | POA: Diagnosis present

## 2023-01-06 DIAGNOSIS — Z9861 Coronary angioplasty status: Secondary | ICD-10-CM | POA: Diagnosis not present

## 2023-01-06 DIAGNOSIS — Z79899 Other long term (current) drug therapy: Secondary | ICD-10-CM | POA: Diagnosis not present

## 2023-01-06 DIAGNOSIS — Z7984 Long term (current) use of oral hypoglycemic drugs: Secondary | ICD-10-CM

## 2023-01-06 DIAGNOSIS — Z7902 Long term (current) use of antithrombotics/antiplatelets: Secondary | ICD-10-CM

## 2023-01-06 DIAGNOSIS — I159 Secondary hypertension, unspecified: Secondary | ICD-10-CM

## 2023-01-06 DIAGNOSIS — Z1152 Encounter for screening for COVID-19: Secondary | ICD-10-CM

## 2023-01-06 DIAGNOSIS — I2510 Atherosclerotic heart disease of native coronary artery without angina pectoris: Secondary | ICD-10-CM | POA: Diagnosis present

## 2023-01-06 LAB — CBC WITH DIFFERENTIAL/PLATELET
Abs Immature Granulocytes: 0.02 10*3/uL (ref 0.00–0.07)
Basophils Absolute: 0 10*3/uL (ref 0.0–0.1)
Basophils Relative: 0 %
Eosinophils Absolute: 0 10*3/uL (ref 0.0–0.5)
Eosinophils Relative: 0 %
HCT: 41.8 % (ref 36.0–46.0)
Hemoglobin: 12.9 g/dL (ref 12.0–15.0)
Immature Granulocytes: 0 %
Lymphocytes Relative: 18 %
Lymphs Abs: 1.1 10*3/uL (ref 0.7–4.0)
MCH: 26 pg (ref 26.0–34.0)
MCHC: 30.9 g/dL (ref 30.0–36.0)
MCV: 84.1 fL (ref 80.0–100.0)
Monocytes Absolute: 0.8 10*3/uL (ref 0.1–1.0)
Monocytes Relative: 12 %
Neutro Abs: 4.4 10*3/uL (ref 1.7–7.7)
Neutrophils Relative %: 70 %
Platelets: 306 10*3/uL (ref 150–400)
RBC: 4.97 MIL/uL (ref 3.87–5.11)
RDW: 14.5 % (ref 11.5–15.5)
WBC: 6.3 10*3/uL (ref 4.0–10.5)
nRBC: 0 % (ref 0.0–0.2)

## 2023-01-06 LAB — TROPONIN I (HIGH SENSITIVITY)
Troponin I (High Sensitivity): 177 ng/L (ref <18)
Troponin I (High Sensitivity): 160 ng/L (ref <18)

## 2023-01-06 LAB — BRAIN NATRIURETIC PEPTIDE: B Natriuretic Peptide: 3948.7 pg/mL — ABNORMAL HIGH (ref 0.0–100.0)

## 2023-01-06 LAB — BASIC METABOLIC PANEL
Anion gap: 15 (ref 5–15)
BUN: 12 mg/dL (ref 8–23)
CO2: 22 mmol/L (ref 22–32)
Calcium: 9.3 mg/dL (ref 8.9–10.3)
Chloride: 104 mmol/L (ref 98–111)
Creatinine, Ser: 1.05 mg/dL — ABNORMAL HIGH (ref 0.44–1.00)
GFR, Estimated: 54 mL/min — ABNORMAL LOW (ref >60)
Glucose, Bld: 120 mg/dL — ABNORMAL HIGH (ref 70–99)
Potassium: 4.5 mmol/L (ref 3.5–5.1)
Sodium: 141 mmol/L (ref 135–145)

## 2023-01-06 LAB — GLUCOSE, CAPILLARY: Glucose-Capillary: 117 mg/dL — ABNORMAL HIGH (ref 70–99)

## 2023-01-06 LAB — SARS CORONAVIRUS 2 BY RT PCR: SARS Coronavirus 2 by RT PCR: NEGATIVE

## 2023-01-06 LAB — MAGNESIUM: Magnesium: 1.8 mg/dL (ref 1.7–2.4)

## 2023-01-06 MED ORDER — SACUBITRIL-VALSARTAN 49-51 MG PO TABS
1.0000 | ORAL_TABLET | Freq: Two times a day (BID) | ORAL | Status: DC
  Administered 2023-01-06 – 2023-01-08 (×4): 1 via ORAL
  Filled 2023-01-06 (×4): qty 1

## 2023-01-06 MED ORDER — DAPAGLIFLOZIN PROPANEDIOL 10 MG PO TABS
10.0000 mg | ORAL_TABLET | Freq: Every day | ORAL | Status: DC
  Administered 2023-01-07 – 2023-01-08 (×2): 10 mg via ORAL
  Filled 2023-01-06 (×2): qty 1

## 2023-01-06 MED ORDER — PANTOPRAZOLE SODIUM 20 MG PO TBEC
20.0000 mg | DELAYED_RELEASE_TABLET | Freq: Every day | ORAL | Status: DC
  Administered 2023-01-07 – 2023-01-08 (×2): 20 mg via ORAL
  Filled 2023-01-06 (×2): qty 1

## 2023-01-06 MED ORDER — ASPIRIN 81 MG PO CHEW
81.0000 mg | CHEWABLE_TABLET | Freq: Every day | ORAL | Status: DC
  Administered 2023-01-07 – 2023-01-08 (×2): 81 mg via ORAL
  Filled 2023-01-06 (×2): qty 1

## 2023-01-06 MED ORDER — ASPIRIN 81 MG PO CHEW: 81.0000 mg | CHEWABLE_TABLET | Freq: Every day | ORAL | Status: DC

## 2023-01-06 MED ORDER — HEPARIN (PORCINE) 25000 UT/250ML-% IV SOLN
900.0000 [IU]/h | INTRAVENOUS | Status: DC
  Administered 2023-01-06: 900 [IU]/h via INTRAVENOUS
  Filled 2023-01-06: qty 250

## 2023-01-06 MED ORDER — FUROSEMIDE 10 MG/ML IJ SOLN
80.0000 mg | Freq: Two times a day (BID) | INTRAMUSCULAR | Status: DC
  Administered 2023-01-06 – 2023-01-08 (×4): 80 mg via INTRAVENOUS
  Filled 2023-01-06 (×4): qty 8

## 2023-01-06 MED ORDER — METFORMIN HCL ER 500 MG PO TB24
500.0000 mg | ORAL_TABLET | Freq: Every day | ORAL | Status: DC
  Administered 2023-01-07 – 2023-01-08 (×2): 500 mg via ORAL
  Filled 2023-01-06 (×2): qty 1

## 2023-01-06 MED ORDER — AMIODARONE HCL 200 MG PO TABS
400.0000 mg | ORAL_TABLET | Freq: Two times a day (BID) | ORAL | Status: DC
  Administered 2023-01-06 – 2023-01-08 (×4): 400 mg via ORAL
  Filled 2023-01-06 (×4): qty 2

## 2023-01-06 MED ORDER — ATORVASTATIN CALCIUM 80 MG PO TABS
80.0000 mg | ORAL_TABLET | Freq: Every day | ORAL | Status: DC
  Administered 2023-01-07 – 2023-01-08 (×2): 80 mg via ORAL
  Filled 2023-01-06 (×2): qty 1

## 2023-01-06 MED ORDER — NITROGLYCERIN 0.4 MG SL SUBL: 0.4000 mg | SUBLINGUAL_TABLET | SUBLINGUAL | Status: DC | PRN

## 2023-01-06 MED ORDER — CLOPIDOGREL BISULFATE 75 MG PO TABS
75.0000 mg | ORAL_TABLET | Freq: Every day | ORAL | Status: DC
  Administered 2023-01-07 – 2023-01-08 (×2): 75 mg via ORAL
  Filled 2023-01-06 (×2): qty 1

## 2023-01-06 MED ORDER — MAGNESIUM OXIDE -MG SUPPLEMENT 400 (240 MG) MG PO TABS
400.0000 mg | ORAL_TABLET | Freq: Every day | ORAL | Status: DC
  Administered 2023-01-07 – 2023-01-08 (×2): 400 mg via ORAL
  Filled 2023-01-06 (×3): qty 1

## 2023-01-06 MED ORDER — SPIRONOLACTONE 12.5 MG HALF TABLET
12.5000 mg | ORAL_TABLET | Freq: Every day | ORAL | Status: DC
  Administered 2023-01-07 – 2023-01-08 (×2): 12.5 mg via ORAL
  Filled 2023-01-06 (×2): qty 1

## 2023-01-06 MED ORDER — METOPROLOL SUCCINATE ER 50 MG PO TB24
50.0000 mg | ORAL_TABLET | Freq: Two times a day (BID) | ORAL | Status: DC
  Administered 2023-01-06 – 2023-01-08 (×4): 50 mg via ORAL
  Filled 2023-01-06 (×4): qty 1

## 2023-01-06 MED ORDER — ATORVASTATIN CALCIUM 80 MG PO TABS: 80.0000 mg | ORAL_TABLET | Freq: Every day | ORAL | Status: DC

## 2023-01-06 MED ORDER — HEPARIN BOLUS VIA INFUSION
4000.0000 [IU] | Freq: Once | INTRAVENOUS | Status: AC
  Administered 2023-01-06: 4000 [IU] via INTRAVENOUS
  Filled 2023-01-06: qty 4000

## 2023-01-06 NOTE — ED Provider Notes (Signed)
[place] EMERGENCY DEPARTMENT AT [facility] Provider Note   CSN: [id number] Arrival date & time: 01/06/23  1659     History  Chief Complaint  Patient presents with   Shortness of Breath    Paula Wong is a 77 y.o. female present emergency department complaining of shortness of breath.  Patient was discharged from the hospital 4 days ago, Thursday, and reports that she began having dyspnea shortness of breath 2 days ago, on Saturday.  She was at her PCPs office today where there was concern for hypoxia and she was advised to come to the ED.  EMS reports that the patient was 70% on room air, and improved on 2L [place].  The patient does not wear oxygen at home.  Patient was hospitalized last week for asymptomatic ventricular tachycardia found on her loop monitor.  She was transition of IV amiodarone to oral amiodarone.  She also had a drug-eluting stent placed into the LAD for severe LAD stenosis, for which she is on aspirin and Plavix.  She was noted to have chronic heart failure with an EF 25-30% in December 06 2022.  Left heart catheterization 12/31/2022 Mid Cx lesion is 5% stenosed.   Ost LAD to Prox LAD lesion is 40% stenosed.   Mid LAD-2 lesion is 30% stenosed.   Ost LM lesion is 40% stenosed.   Mid LM to Dist LM lesion is 30% stenosed.   RV Branch lesion is 70% stenosed.   Prox LAD to Mid LAD lesion is 85% stenosed.   Mid LAD-1 lesion is 85% stenosed.   A drug-eluting stent was successfully placed using a SYNERGY XD 3.0X24.   Post intervention, there is a 0% residual stenosis.   Post intervention, there is a 0% residual stenosis.   LV end diastolic pressure is moderately elevated.   2 vessel obstructive CAD with severe LAD stenosis sequentially with heavy calcification. Moderate RCA disease diffusely in a small branch Moderately elevated LVEDP 25 mm Hg Successful PCI of the mid LAD using IVUS guidance, Shockwave intracoronary lithotripsy and DES x 1.  HPI      Home Medications Prior to Admission medications   Medication Sig Start Date End Date Taking? Authorizing Provider  amiodarone (PACERONE) 200 MG tablet Take 2 tablets (400 mg total) by mouth 2 (two) times daily for 14 days, THEN 1 tablet (200 mg total) 2 (two) times daily for 14 days, THEN 1 tablet (200 mg total) daily thereafter 01/02/23 02/09/23  Abagail Kitchens, PA-C  aspirin 81 MG chewable tablet Chew 1 tablet (81 mg total) by mouth daily. 01/03/23   Abagail Kitchens, PA-C  atorvastatin (LIPITOR) 80 MG tablet Take 80 mg by mouth daily. 04/18/22   [provider]  clopidogrel (PLAVIX) 75 MG tablet Take 1 tablet (75 mg total) by mouth daily. 01/02/23   Yvonna Alanis L, PA-C  FARXIGA 10 MG TABS tablet Take 10 mg by mouth daily. 03/14/22   [provider]  gabapentin (NEURONTIN) 600 MG tablet Take 600 mg by mouth 2 (two) times daily. 08/17/22   [provider]  magnesium oxide (MAG-OX) 400 MG tablet Take 400 mg by mouth daily.    [provider]  metFORMIN (GLUCOPHAGE-XR) 500 MG 24 hr tablet Take 500 mg by mouth daily with breakfast. 03/14/22   [provider]  metoprolol succinate (TOPROL-XL) 50 MG 24 hr tablet Take 50 mg by mouth 2 (two) times daily. 07/31/22   [provider]  Multiple Vitamin (MULTIVITAMIN)  tablet Take 1 tablet by mouth daily.    [provider]  nitroGLYCERIN (NITROSTAT) 0.4 MG SL tablet Place 1 tablet (0.4 mg total) under the tongue every 5 (five) minutes x 3 doses as needed for chest pain. 01/02/23   Yvonna Alanis L, PA-C  OZEMPIC, 1 MG/DOSE, 4 MG/3ML SOPN Inject 1 mg into the skin once a week. 12/01/22   [provider]  pantoprazole (PROTONIX) 20 MG tablet Take 1 tablet (20 mg total) by mouth daily. 09/10/22   Pyrtle, Carie Caddy, MD  potassium chloride (KLOR-CON M) 10 MEQ tablet Take 1 tablet (10 mEq total) by mouth daily. 01/02/23   Abagail Kitchens, PA-C  sacubitril-valsartan (ENTRESTO) 49-51 MG Take 1 tablet by mouth 2  (two) times daily. 09/06/22   Nahser, Deloris Ping, MD  spironolactone (ALDACTONE) 25 MG tablet Take 12.5 mg by mouth daily. 03/18/22   [provider]  TRESIBA FLEXTOUCH 200 UNIT/ML SOPN Take 80 Units by mouth at bedtime.  10/12/18   [provider]      Allergies    Patient has no known allergies.    Review of Systems   Review of Systems  Physical Exam Updated Vital Signs BP (!) 164/89 (BP Location: Left Arm)   Pulse 61   Temp 98 F (36.7 C) (Oral)   Resp (!) 24   Ht 5\' 5"  (1.651 m)   Wt 84.9 kg   SpO2 92%   BMI 31.15 kg/m  Physical Exam Constitutional:      General: She is not in acute distress. HENT:     Head: Normocephalic and atraumatic.  Eyes:     Conjunctiva/sclera: Conjunctivae normal.     Pupils: Pupils are equal, round, and reactive to light.  Cardiovascular:     Rate and Rhythm: Normal rate and regular rhythm.  Pulmonary:     Effort: Pulmonary effort is normal. No respiratory distress.     Comments: 85% on room air, improved to 94% on 2L [place] No audible wheezing Abdominal:     General: There is no distension.     Tenderness: There is no abdominal tenderness.  Skin:    General: Skin is warm and dry.  Neurological:     General: No focal deficit present.     Mental Status: She is alert. Mental status is at baseline.  Psychiatric:        Mood and Affect: Mood normal.        Behavior: Behavior normal.     ED Results / Procedures / Treatments   Labs (all labs ordered are listed, but only abnormal results are displayed) Labs Reviewed  BASIC METABOLIC PANEL - Abnormal; Notable for the following components:      Result Value   Glucose, Bld 120 (*)    Creatinine, Ser 1.05 (*)    GFR, Estimated 54 (*)    All other components within normal limits  BRAIN NATRIURETIC PEPTIDE - Abnormal; Notable for the following components:   B Natriuretic Peptide 3,948.7 (*)    All other components within normal limits  TROPONIN I (HIGH SENSITIVITY) - Abnormal;  Notable for the following components:   Troponin I (High Sensitivity) 177 (*)    All other components within normal limits  TROPONIN I (HIGH SENSITIVITY) - Abnormal; Notable for the following components:   Troponin I (High Sensitivity) 160 (*)    All other components within normal limits  SARS CORONAVIRUS 2 BY RT PCR  CBC WITH DIFFERENTIAL/PLATELET  MAGNESIUM  HEPARIN LEVEL (  UNFRACTIONATED)  CBC    EKG EKG Interpretation  Date/Time:  Monday Jan 06 2023 17:09:54 EDT Ventricular Rate:  63 PR Interval:  177 QRS Duration: 122 QT Interval:  550 QTC Calculation: 564 R Axis:   34 Text Interpretation: Sinus rhythm Nonspecific intraventricular conduction delay Probable anteroseptal infarct, old  No sig changes from prior tracing Confirmed by Alvester Chou [phone]) on 01/06/2023 5:16:25 PM  Radiology DG Chest 2 View  Result Date: 01/06/2023 CLINICAL DATA:  Shortness of breath. EXAM: CHEST - 2 VIEW COMPARISON:  Dec 30, 2022. FINDINGS: Stable cardiomegaly. Small bilateral pleural effusions are noted with associated bibasilar atelectasis or edema. Bony thorax is unremarkable. IMPRESSION: Bibasilar atelectasis or edema is noted with associated pleural effusions. Electronically Signed   By: Lupita Raider M.D.   On: 01/06/2023 17:39    Procedures Procedures    Medications Ordered in ED Medications  amiodarone (PACERONE) tablet 400 mg (has no administration in time range)  metoprolol succinate (TOPROL-XL) 24 hr tablet 50 mg (has no administration in time range)  nitroGLYCERIN (NITROSTAT) SL tablet 0.4 mg (has no administration in time range)  sacubitril-valsartan (ENTRESTO) 49-51 mg per tablet (has no administration in time range)  spironolactone (ALDACTONE) tablet 12.5 mg (has no administration in time range)  dapagliflozin propanediol (FARXIGA) tablet 10 mg (has no administration in time range)  metFORMIN (GLUCOPHAGE-XR) 24 hr tablet 500 mg (has no administration in time range)   magnesium oxide (MAG-OX) tablet 400 mg (has no administration in time range)  pantoprazole (PROTONIX) EC tablet 20 mg (has no administration in time range)  clopidogrel (PLAVIX) tablet 75 mg (has no administration in time range)  furosemide (LASIX) injection 80 mg (80 mg Intravenous Given 01/06/23 1953)  heparin ADULT infusion 100 units/mL (25000 units/256mL) (900 Units/hr Intravenous New Bag/Given 01/06/23 1957)  aspirin chewable tablet 81 mg (has no administration in time range)  atorvastatin (LIPITOR) tablet 80 mg (has no administration in time range)  heparin bolus via infusion 4,000 Units (4,000 Units Intravenous Bolus from Bag 01/06/23 1955)    ED Course/ Medical Decision Making/ A&P Clinical Course as of 01/06/23 2211  Mon Jan 06, 2023  1941 Pt was admitted by cardiology service [MT]    Clinical Course User Index [MT] Terald Sleeper, MD                             Medical Decision Making Amount and/or Complexity of Data Reviewed Labs: ordered. Radiology: ordered. ECG/medicine tests: ordered.  Risk Decision regarding hospitalization.   This patient presents to the ED with concern for shortness of breath. This involves an extensive number of treatment options, and is a complaint that carries with it a high risk of complications and morbidity.  The differential diagnosis includes pleural effusion versus pneumonia versus worsening congestive heart failure versus acute anemia versus pulmonary embolism versus other  Co-morbidities that complicate the patient evaluation: History of advanced congestive heart failure at high risk of pulmonary edema and complication  Additional history obtained from EMS  External records from outside source obtained and reviewed including last heart catheterization report and hospital discharge summary from last week  I ordered and personally interpreted labs.  The pertinent results include:  Trop 100 -> 160.  BNP 3948.  Hgb wnl.  Wbc wnl.  Cr  1.05  I ordered imaging studies including x-ray of the chest I independently visualized and interpreted imaging which showed bilateral small pleural effusion I  agree with the radiologist interpretation  The patient was maintained on a cardiac monitor.  I personally viewed and interpreted the cardiac monitored which showed an underlying rhythm of: Sinus rhythm  Per my interpretation the patient's ECG shows sinus rhythm, heart rate 63, no significant changes from Jan 01 2023 tracing, no acute ischemic changes. No STEMI on ecg.  I have reviewed the patients home medicines and have made adjustments as needed  Test Considered: Lower suspicion for acute PE in this clinical setting.  Cardiology was consulted and patient will admit to their service  After the interventions noted above, I reevaluated the patient and found that they have: stayed the same   IV diuretic and heparin ordered by cardiology service.  Dispostion:  After consideration of the diagnostic results and the patients response to treatment, I feel that the patent would benefit from hospital admission.         Final Clinical Impression(s) / ED Diagnoses Final diagnoses:  Shortness of breath    Rx / DC Orders ED Discharge Orders     None         Terald Sleeper, MD 01/06/23 2211

## 2023-01-06 NOTE — Progress Notes (Signed)
ANTICOAGULATION CONSULT NOTE - Initial Consult  Pharmacy Consult for heparin Indication: chest pain/ACS  No Known Allergies  Patient Measurements:   Heparin Dosing Weight: 74.6 kg  Vital Signs: Temp: 98 F (36.7 C) (05/20 1710) Temp Source: Oral (05/20 1710) BP: 132/75 (05/20 1800) Pulse Rate: 62 (05/20 1815)  Labs: Recent Labs    01/06/23 1800  HGB 12.9  HCT 41.8  PLT 306  CREATININE 1.05*  TROPONINIHS 177*    Estimated Creatinine Clearance: 46.8 mL/min (A) (by C-G formula based on SCr of 1.05 mg/dL (H)).   Medical History: Past Medical History:  Diagnosis Date   Allergy    CAD (coronary artery disease)    a. 03/2022 Cath: LM 40ost, 51m, LAD 40ost/p, 80m, 33m, LCX 65m, RCA nl, RV branch 70-->Med rx.   Chronic HFrEF (heart failure with reduced ejection fraction) (HCC)    a. 11/2021 Echo: EF 20-25%; b. 03/2022 Echo: EF 20-25%; c. 11/2022 Echo: EF 25-30%, glob HK.   Diabetes (HCC)    Diverticulosis    GERD (gastroesophageal reflux disease)    Hiatal hernia    Hyperlipidemia    Hypertension    Implantable loop recorder present    a. 03/2022 s/p Abbott IQ EL+ (serial [id number]) ILR in setting of cryptogenic stroke.   Ischemic cardiomyopathy    a. 11/2021 Echo: EF 20-25%; b. 03/2022 Echo: EF 20-25%; c. 11/2022 Echo: EF 25-30%, glob HK, nl RV fxn, mild-mod MR, mild AI, AoV sclerosis.   Neuropathy    feet   Schatzki's ring    Stroke [facility])    a. 03/2022 MRI Brain: subacute cortical infarct involving inf and med L occipial pole w/ petechial hemorrhage; b. 03/2022 s/p Abbott Assert IQ EL ILR.   Ventricular tachycardia (HCC)    a. 12/2022 noted on ILR.    Medications:  (Not in a hospital admission)  Scheduled:   furosemide  80 mg Intravenous BID   heparin  4,000 Units Intravenous Once   Infusions:   heparin      Assessment: Patient with history of CVA, CHF, and CAD with stent ti LAD on 12/31/22 presented from clinic with shortness of breath. Pharmacy consulted to dose  heparin. Not to anticoagulation PTA.  CBC today WNL.  Goal of Therapy:  Heparin level 0.3-0.7 units/ml Monitor platelets by anticoagulation protocol: Yes   Plan:  Give heparin 4000 unit bolus Start heparin at 900 units/hr Heparin level in 8 hours Daily heparin level and CBC  Thank you for involving pharmacy in this patient's care.  Enos Fling, PharmD PGY2 Pharmacy Resident 01/06/2023 7:51 PM

## 2023-01-06 NOTE — ED Notes (Signed)
Patient transported to X-ray 

## 2023-01-06 NOTE — Consult Note (Signed)
Expand All Collapse All  Cardiology Office Note:     Date:  01/06/2023    ID:  Paula Wong, DOB 1946/05/02, MRN [id number]   PCP:  Loura Back, NP              [facility] Providers Cardiologist:  Kyrillos Adams  EP:  Lalla Brothers     Referring MD: Loura Back, NP        Chief Complaint  Patient presents with   Congestive Heart Failure               History of Present Illness:     Paula Wong is a 77 y.o. female with a hx of CVA , CHF I met her during a recent hospitalization for her stroke    Coronary artery calcium score is 2553 RCA - mild - mod  CAD LM - moderate CAD LAD - mod disease in 2 large diags,  severe disease in mid vessel and distal vessel  Ramus :  proximal severe disease  LCx: moderate CAD   Cath on Aug. 29, 2023:   mod - severe CAD . Her LV dysfunction seemed out of proportion to her degree of CAD .  If she develops symptoms of CP, can consider shockwave therapy or CSI atherectomy       Jan. 19, 2024   Paula Wong is seen today for follow up of her hx of CVA, CHF Has moderate CAD.  Her LV dysfunction seems to be out of proportion to her degree of her CAD .  Echocardiogram in August, 2023 reveals EF of 20 to 25%.   Current medications include  Entresto 24-26 twice a day Metoprolol succinate 50 mg twice a day Farxiga 10 mg a day Aldactone 12.5 mg a day   HR and BP are well controlled.  Not getting any exercise      December 10, 2022 Seen for follow-up of her coronary artery disease and CVA.  She is here for preoperative visit.  She has a sebaceous cyst on her left side of her neck.   She is on plavix    She is at low risk for her upcoming cyst  removal.  She needs clearance to stop plavix She may hold her Plavix for 5 days prior to procedure.  She should restart plavix as soon as it is safe from a surgical standpoint      Jan 06, 2023 Paula Wong is seen for follow up of her CHF, CAD She was admmitted to the hospital in Dec 30, 2022 with  sustained VT She had no apparent symptoms but the VT was picked up on her implantable loop recorder.    HR as high as 180, polymorphic ,  lasted 23 seconds, spontaneously terminated  Cath showed a tight mid LAD stenosis which was successfully stented ( introcoronary lithotripsy and DES)    Echocardiogram from December 06, 2022 reveals severe LV dysfunction with EF of 25 to 30%.   More short of breath than usual    Avoid salty foods  Having difficulty sleeping . No CP    She is very short of breath today O2 sats range from 77-88 while sitting in the wheelchair   She was not this short of breath when she was discharged. Started 2-3 days later    No CP    No pleuretic cp  Few basilar rales  Is not on lasix            Past Medical History:  Diagnosis Date  Allergy     CAD (coronary artery disease)      a. 03/2022 Cath: LM 40ost, 64m, LAD 40ost/p, 20m, 60m, LCX 76m, RCA nl, RV branch 70-->Med rx.   Chronic HFrEF (heart failure with reduced ejection fraction) (HCC)      a. 11/2021 Echo: EF 20-25%; b. 03/2022 Echo: EF 20-25%; c. 11/2022 Echo: EF 25-30%, glob HK.   Diabetes (HCC)     Diverticulosis     GERD (gastroesophageal reflux disease)     Hiatal hernia     Hyperlipidemia     Hypertension     Implantable loop recorder present      a. 03/2022 s/p Abbott IQ EL+ (serial [id number]) ILR in setting of cryptogenic stroke.   Ischemic cardiomyopathy      a. 11/2021 Echo: EF 20-25%; b. 03/2022 Echo: EF 20-25%; c. 11/2022 Echo: EF 25-30%, glob HK, nl RV fxn, mild-mod MR, mild AI, AoV sclerosis.   Neuropathy      feet   Schatzki's ring     Stroke [facility])      a. 03/2022 MRI Brain: subacute cortical infarct involving inf and med L occipial pole w/ petechial hemorrhage; b. 03/2022 s/p Abbott Assert IQ EL ILR.   Ventricular tachycardia (HCC)      a. 12/2022 noted on ILR.           Past Surgical History:  Procedure Laterality Date   CORONARY LITHOTRIPSY N/A 12/31/2022    Procedure: CORONARY  LITHOTRIPSY;  Surgeon: Swaziland, Peter M, MD;  Location: [facility] INVASIVE CV LAB;  Service: Cardiovascular;  Laterality: N/A;   CORONARY STENT INTERVENTION N/A 12/31/2022    Procedure: CORONARY STENT INTERVENTION;  Surgeon: Swaziland, Peter M, MD;  Location: [facility] INVASIVE CV LAB;  Service: Cardiovascular;  Laterality: N/A;   CORONARY ULTRASOUND/IVUS N/A 12/31/2022    Procedure: Coronary Ultrasound/IVUS;  Surgeon: Swaziland, Peter M, MD;  Location: [facility] INVASIVE CV LAB;  Service: Cardiovascular;  Laterality: N/A;   LEFT HEART CATH AND CORONARY ANGIOGRAPHY N/A 04/16/2022    Procedure: LEFT HEART CATH AND CORONARY ANGIOGRAPHY;  Surgeon: Lennette Bihari, MD;  Location: [facility] INVASIVE CV LAB;  Service: Cardiovascular;  Laterality: N/A;   LEFT HEART CATH AND CORONARY ANGIOGRAPHY N/A 12/31/2022    Procedure: LEFT HEART CATH AND CORONARY ANGIOGRAPHY;  Surgeon: Swaziland, Peter M, MD;  Location: [facility] INVASIVE CV LAB;  Service: Cardiovascular;  Laterality: N/A;   LOOP RECORDER INSERTION N/A 04/15/2022    Procedure: LOOP RECORDER INSERTION;  Surgeon: Lanier Prude, MD;  Location: [facility] INVASIVE CV LAB;  Service: Cardiovascular;  Laterality: N/A;   MASS EXCISION Right 06/07/2019    Procedure: Dawson Bills OF KELOID TO RIGHT NECK;  Surgeon: Louisa Second, MD;  Location: [facility];  Service: Plastics;  Laterality: Right;   TOOTH EXTRACTION   2013      Current Medications: Active Medications      Current Meds  Medication Sig   amiodarone (PACERONE) 200 MG tablet Take 2 tablets (400 mg total) by mouth 2 (two) times daily for 14 days, THEN 1 tablet (200 mg total) 2 (two) times daily for 14 days, THEN 1 tablet (200 mg total) daily thereafter   aspirin 81 MG chewable tablet Chew 1 tablet (81 mg total) by mouth daily.   atorvastatin (LIPITOR) 80 MG tablet Take 80 mg by mouth daily.   clopidogrel (PLAVIX) 75 MG tablet Take 1 tablet (75 mg total) by mouth daily.   FARXIGA 10 MG TABS tablet Take 10 mg by  mouth daily.    gabapentin (NEURONTIN) 600 MG tablet Take 600 mg by mouth 2 (two) times daily.   magnesium oxide (MAG-OX) 400 MG tablet Take 400 mg by mouth daily.   metFORMIN (GLUCOPHAGE-XR) 500 MG 24 hr tablet Take 500 mg by mouth daily with breakfast.   metoprolol succinate (TOPROL-XL) 50 MG 24 hr tablet Take 50 mg by mouth 2 (two) times daily.   Multiple Vitamin (MULTIVITAMIN) tablet Take 1 tablet by mouth daily.   nitroGLYCERIN (NITROSTAT) 0.4 MG SL tablet Place 1 tablet (0.4 mg total) under the tongue every 5 (five) minutes x 3 doses as needed for chest pain.   OZEMPIC, 1 MG/DOSE, 4 MG/3ML SOPN Inject 1 mg into the skin once a week.   pantoprazole (PROTONIX) 20 MG tablet Take 1 tablet (20 mg total) by mouth daily.   potassium chloride (KLOR-CON M) 10 MEQ tablet Take 1 tablet (10 mEq total) by mouth daily.   sacubitril-valsartan (ENTRESTO) 49-51 MG Take 1 tablet by mouth 2 (two) times daily.   spironolactone (ALDACTONE) 25 MG tablet Take 12.5 mg by mouth daily.   TRESIBA FLEXTOUCH 200 UNIT/ML SOPN Take 80 Units by mouth at bedtime.         Allergies:   Patient has no known allergies.    Social History         Socioeconomic History   Marital status: Single      Spouse name: Not on file   Number of children: Not on file   Years of education: Not on file   Highest education level: Not on file  Occupational History   Occupation: Retired  Tobacco Use   Smoking status: Former      Types: Cigarettes      Quit date: 08/20/2003      Years since quitting: 19.3   Smokeless tobacco: Never  Vaping Use   Vaping Use: Never used  Substance and Sexual Activity   Alcohol use: No   Drug use: No   Sexual activity: Not on file  Other Topics Concern   Not on file  Social History Narrative   Not on file    Social Determinants of Health        Financial Resource Strain: Not on file  Food Insecurity: No Food Insecurity (12/30/2022)    Hunger Vital Sign     Worried About Running Out of Food in the Last  Year: Never true     Ran Out of Food in the Last Year: Never true  Transportation Needs: No Transportation Needs (12/30/2022)    PRAPARE - Therapist, art (Medical): No     Lack of Transportation (Non-Medical): No  Physical Activity: Not on file  Stress: Not on file  Social Connections: Not on file      Family History: The patient's family history includes Cancer in her sister; Colon cancer (age of onset: 5) in her mother; Heart attack in her brother and father. There is no history of Stomach cancer, Pancreatic cancer, or Esophageal cancer.   ROS:   Please see the history of present illness.     All other systems reviewed and are negative.   EKGs/Labs/Other Studies Reviewed:     The following studies were reviewed today:       Recent Labs: 12/30/2022: B Natriuretic Peptide 1,847.2 01/01/2023: ALT 12; TSH 3.959 01/02/2023: BUN 12; Creatinine, Ser 1.07; Hemoglobin 13.9; Magnesium 2.0; Platelets 283; Potassium 3.9; Sodium 143  Recent Lipid Panel Labs (Brief)  Component Value Date/Time    CHOL 100 12/31/2022 0053    TRIG 101 12/31/2022 0053    HDL 39 (L) 12/31/2022 0053    CHOLHDL 2.6 12/31/2022 0053    VLDL 20 12/31/2022 0053    LDLCALC 41 12/31/2022 0053    LDLCALC 90 10/02/2020 1100          Risk Assessment/Calculations:       Physical Exam: Blood pressure (!) 140/80, pulse 62, height 5\' 5"  (1.651 m), weight 181 lb 11.2 oz (82.4 kg), SpO2 (!) 82 %.   HYPERTENSION CONTROL     Vitals:    01/06/23 1542 01/06/23 1615  BP: (!) 148/82 (!) 140/80    The patient's blood pressure is elevated above target today.  In order to address the patient's elevated BP: A new medication was prescribed today.         GEN:  elderly female, in moderate respiratory distress  NECK: No JVD; No carotid bruits LYMPHATICS: No lymphadenopathy CARDIAC: RRR , no murmurs, rubs, gallops RESPIRATORY:  Clear to auscultation without rales, wheezing or  rhonchi  ABDOMEN: Soft, non-tender, non-distended MUSCULOSKELETAL:  No edema; No deformity  SKIN: Warm and dry NEUROLOGIC:  Alert and oriented x 3     EKG:     ASSESSMENT:     No diagnosis found.       PLAN:         Ventricular tachycardia: She was diagnosed as having ventricular tachycardia on an implantable loop.  She was called last week and was admitted to the hospital.  Workup included a heart catheterization with subsequent stenting of her left anterior descending artery.        2.  Chronic combined CHF:   Her ejection fraction is 25 to 30%.  It is quite possible that she is going into transient congestive heart failure.   3.   CAD :  has moderate CAD of several branch vessels.  The stenosis are calcified.    4.  Hypoxemia: Patient presents with severe hypoxemia over the past several days.  She was not this short of breath when she left the hospital.  Possible etiologies include acute worsening of her congestive heart failure, acute coronary artery syndrome, pulmonary embolus.   She is not tachycardic and has no pleuritic chest pain.  I doubt that this was a pulmonary embolus.  She has not been eating a lot of salt.   She is not on Lasix but is on Entresto and spironolactone.  I suspect that she may need some IV Lasix.   At this point in the afternoon it is too late to try to diurese her and get her home oxygen.  I think that she needs to be readmitted to the hospital.  She becomes severely hypoxemic with the slightest bit of exercise such as getting up from her wheelchair to transfer.  I do not think it will be safe for her to return to home.   Will call EMS for transfer to the ER for further evaluation and admission .    She will need labs , xrays , work up for her hypoxemia .    Likely to need admission for a day or so .          Medication Adjustments/Labs and Tests Ordered: Current medicines are reviewed at length with the patient today.  Concerns regarding  medicines are outlined above.  No orders of the defined types were placed in this encounter.   No orders of  the defined types were placed in this encounter.        Signed, Kristeen Miss, MD  01/06/2023 5:23 PM    [facility]

## 2023-01-06 NOTE — Progress Notes (Signed)
Subjective    Pt sent from office 2/2 dyspnea, hypoxia, and CHF.  See note dictated earlier today for full details/H & P.  Pt seen in the ED sitting upright in stretcher w/ O2 via [place].  No acute distress.  Notes 3 day h/o progressive DOE and orthopnea at home.  Appetite has been poor  Early satiety.  Hadn't noted mild ankle edema, which is present on exam.  Denies any recent h/o chest pain.  Objective     Vitals:   01/06/23 1800 01/06/23 1815  BP: 132/75   Pulse: 62 62  Resp: 20 20  Temp:    SpO2: 94% 94%   Pleasant, NAD, AAOx3.  Mod elev JVD, Lungs bibasilar crackles, Cor RRR, no murmurs. Abd semi-firm, nt/nd/bs+x4. Ext trace bilat ankle edema.  Lab Results  Component Value Date   WBC 6.3 01/06/2023   HGB 12.9 01/06/2023   HCT 41.8 01/06/2023   MCV 84.1 01/06/2023   PLT 306 01/06/2023    Lab Results  Component Value Date   CREATININE 1.05 (H) 01/06/2023   BUN 12 01/06/2023   NA 141 01/06/2023   K 4.5 01/06/2023   CL 104 01/06/2023   CO2 22 01/06/2023   Recent Labs  Lab 12/30/22 2236 12/31/22 0053 01/06/23 1800  TROPONINIHS 10 12 177*     BNP    Component Value Date/Time   BNP 3,948.7 (H) 01/06/2023 1800  _____________   CLINICAL DATA:  Shortness of breath.   EXAM: CHEST - 2 VIEW   COMPARISON:  Dec 30, 2022.   FINDINGS: Stable cardiomegaly. Small bilateral pleural effusions are noted with associated bibasilar atelectasis or edema. Bony thorax is unremarkable.   IMPRESSION: Bibasilar atelectasis or edema is noted with associated pleural effusions.    Electronically Signed   By: Zenda Alpers. _____________   ECG: RSR, 63, LAE, IVCD, anteroseptal infarct - no acute changes _____________   2D Echocardiogram 4.19.2024  1. Left ventricular ejection fraction, by estimation, is 25 to 30%. The  left ventricle has severely decreased function. The left ventricle  demonstrates global hypokinesis. The left ventricular internal cavity size  was  moderately dilated. Left  ventricular diastolic parameters are indeterminate.   2. Right ventricular systolic function is normal. The right ventricular  size is normal.   3. The mitral valve is normal in structure. Mild to moderate mitral valve  regurgitation. No evidence of mitral stenosis.   4. The aortic valve is tricuspid. There is mild calcification of the  aortic valve. Aortic valve regurgitation is mild. Aortic valve sclerosis  is present, with no evidence of aortic valve stenosis.   5. The inferior vena cava is normal in size with greater than 50%  respiratory variability, suggesting right atrial pressure of 3 mmHg.  _____________   Cardiac Catheterization and Percutaneous Coronary Intervention 5.14.2024  Diagnostic Dominance: Right  Intervention   _____________    Assessment & Plan    1.  Acute on chronic HFrEF/Ischemic Cardiomyopathy/hypoxemia:  EF 25-30% by echo in April.  Recent admission in the setting of sustained VT noted on implantable loop recorder.  Found to have severe prox/mid LAD dzs, now s/p intracoronary lithotripsy and DES x 1.  Residual RCA dzs.  Seen in clinic earlier today w/ progressive dyspnea, orthopnea, early satiety, and hypoxia (sats in 80's), prompting EMS transport to ED.  Here, labs notable for BNP of 3948.7 and HsTrop of 177 (was 12 on last admission).  CXR w/ edema.  She denies c/p and is in no acute distress @ rest, sitting upright.  Crackles, JVD, inc abd girth, and trace lower ext edema on exam.  Cont home meds.  Adding lasix 80 mg IV BID.  F/u labs in AM.  Cont ? blocker, entresto, farxiga.  On GLP1 agonist @ home - hold.  Consider spiro.  2.  CAD/Demand ischemia:  s/p PCI/DES to the LAD on 5/14, as outlined above.  Known residual RCA dzs.  No chest pain.  HsTrop 177.  Cont to cycle.  Diuresis as above.  Cont asa, plavix, ? blocker, statin rx.  3.  VT:  in setting of ischemia prior to last admission.  S/p PCI LAD.  No syncope/presyncope.  Cont ?  blocker.  Nicolasa Ducking, NP 01/06/2023, 7:36 PM

## 2023-01-06 NOTE — ED Notes (Signed)
.ED TO INPATIENT HANDOFF REPORT  ED Nurse Name and Phone #: [phone]  S Name/Age/Gender Paula Wong 77 y.o. female Room/Bed: 001C/001C  Code Status   Code Status: Full Code  Home/SNF/Other Home Patient oriented to: self, place, time, and situation Is this baseline? Yes   Triage Complete: Triage complete  Chief Complaint Acute on chronic HFrEF (heart failure with reduced ejection fraction) (HCC) [I50.23]  Triage Note Pt to ED via EMS from doctors office. Per EMS pt was at follow up apt and got SOB, staff stated pt O2 sats was 70s RA they place her on [place] 2L and it increased to 94%. Pt arrives to ED Aox4, c/o SOB that first started on Saturday. Pt reports she recently had a cardiac stent placed last Wednesday. Pt denies CP and states she does not use O2 at home.   Allergies No Known Allergies  Level of Care/Admitting Diagnosis ED Disposition     ED Disposition  Admit   Condition  --   Comment  Hospital Area: [facility] [[id number]]  Level of Care: Progressive [102]  Admit to Progressive based on following criteria: CARDIOVASCULAR & THORACIC of moderate stability with acute coronary syndrome symptoms/low risk myocardial infarction/hypertensive urgency/arrhythmias/heart failure potentially compromising stability and stable post cardiovascular intervention patients.  May admit patient to [facility] or [facility] if equivalent level of care is available:: No  Covid Evaluation: Asymptomatic - no recent exposure (last 10 days) testing not required  Diagnosis: Acute on chronic HFrEF (heart failure with reduced ejection fraction) [facility]) [[id number]]  Admitting Physician: [name] [phone]  Attending Physician: [name] [phone]  Certification:: I certify this patient will need inpatient services for at least 2 midnights  Estimated Length of Stay: 2          B Medical/Surgery History Past Medical History:  Diagnosis Date   Allergy    CAD (coronary  artery disease)    a. 03/2022 Cath: LM 40ost, 83m, LAD 40ost/p, 72m, 66m, LCX 78m, RCA nl, RV branch 70-->Med rx.   Chronic HFrEF (heart failure with reduced ejection fraction) (HCC)    a. 11/2021 Echo: EF 20-25%; b. 03/2022 Echo: EF 20-25%; c. 11/2022 Echo: EF 25-30%, glob HK.   Diabetes (HCC)    Diverticulosis    GERD (gastroesophageal reflux disease)    Hiatal hernia    Hyperlipidemia    Hypertension    Implantable loop recorder present    a. 03/2022 s/p Abbott IQ EL+ (serial [id number]) ILR in setting of cryptogenic stroke.   Ischemic cardiomyopathy    a. 11/2021 Echo: EF 20-25%; b. 03/2022 Echo: EF 20-25%; c. 11/2022 Echo: EF 25-30%, glob HK, nl RV fxn, mild-mod MR, mild AI, AoV sclerosis.   Neuropathy    feet   Schatzki's ring    Stroke [facility])    a. 03/2022 MRI Brain: subacute cortical infarct involving inf and med L occipial pole w/ petechial hemorrhage; b. 03/2022 s/p Abbott Assert IQ EL ILR.   Ventricular tachycardia (HCC)    a. 12/2022 noted on ILR.   Past Surgical History:  Procedure Laterality Date   CORONARY LITHOTRIPSY N/A 12/31/2022   Procedure: CORONARY LITHOTRIPSY;  Surgeon: Swaziland, Peter M, MD;  Location: [facility] INVASIVE CV LAB;  Service: Cardiovascular;  Laterality: N/A;   CORONARY STENT INTERVENTION N/A 12/31/2022   Procedure: CORONARY STENT INTERVENTION;  Surgeon: Swaziland, Peter M, MD;  Location: [facility] INVASIVE CV LAB;  Service: Cardiovascular;  Laterality: N/A;   CORONARY ULTRASOUND/IVUS N/A 12/31/2022  Procedure: Coronary Ultrasound/IVUS;  Surgeon: Swaziland, Peter M, MD;  Location: [facility] INVASIVE CV LAB;  Service: Cardiovascular;  Laterality: N/A;   LEFT HEART CATH AND CORONARY ANGIOGRAPHY N/A 04/16/2022   Procedure: LEFT HEART CATH AND CORONARY ANGIOGRAPHY;  Surgeon: Lennette Bihari, MD;  Location: [facility] INVASIVE CV LAB;  Service: Cardiovascular;  Laterality: N/A;   LEFT HEART CATH AND CORONARY ANGIOGRAPHY N/A 12/31/2022   Procedure: LEFT HEART CATH AND CORONARY ANGIOGRAPHY;  Surgeon: Swaziland,  Peter M, MD;  Location: [facility] INVASIVE CV LAB;  Service: Cardiovascular;  Laterality: N/A;   LOOP RECORDER INSERTION N/A 04/15/2022   Procedure: LOOP RECORDER INSERTION;  Surgeon: Lanier Prude, MD;  Location: [facility] INVASIVE CV LAB;  Service: Cardiovascular;  Laterality: N/A;   MASS EXCISION Right 06/07/2019   Procedure: Dawson Bills OF KELOID TO RIGHT NECK;  Surgeon: Louisa Second, MD;  Location: [facility];  Service: Plastics;  Laterality: Right;   TOOTH EXTRACTION  2013     A IV Location/Drains/Wounds Patient Lines/Drains/Airways Status     Active Line/Drains/Airways     Name Placement date Placement time Site Days   Peripheral IV 01/06/23 22 G Posterior;Right Hand 01/06/23  1758  Hand  less than 1   Wound / Incision (Open or Dehisced) 04/16/22 Incision - Open;Other (Comment) Wrist Lower;Right;Posterior 04/16/22  0929  Wrist  265            Intake/Output Last 24 hours No intake or output data in the 24 hours ending 01/06/23 1912  Labs/Imaging Results for orders placed or performed during the hospital encounter of 01/06/23 (from the past 48 hour(s))  Basic metabolic panel     Status: Abnormal   Collection Time: 01/06/23  6:00 PM  Result Value Ref Range   Sodium 141 135 - 145 mmol/L   Potassium 4.5 3.5 - 5.1 mmol/L   Chloride 104 98 - 111 mmol/L   CO2 22 22 - 32 mmol/L   Glucose, Bld 120 (H) 70 - 99 mg/dL    Comment: Glucose reference range applies only to samples taken after fasting for at least 8 hours.   BUN 12 8 - 23 mg/dL   Creatinine, Ser 1.61 (H) 0.44 - 1.00 mg/dL   Calcium 9.3 8.9 - 09.6 mg/dL   GFR, Estimated 54 (L) >60 mL/min    Comment: (NOTE) Calculated using the CKD-EPI Creatinine Equation (2021)    Anion gap 15 5 - 15    Comment: Performed at [facility], [address] [place], [place] [address]  CBC with Differential     Status: None   Collection Time: 01/06/23  6:00 PM  Result Value Ref Range   WBC 6.3 4.0 - 10.5 K/uL   RBC 4.97  3.87 - 5.11 MIL/uL   Hemoglobin 12.9 12.0 - 15.0 g/dL   HCT 98.1 19.1 - 47.8 %   MCV 84.1 80.0 - 100.0 fL   MCH 26.0 26.0 - 34.0 pg   MCHC 30.9 30.0 - 36.0 g/dL   RDW 29.5 62.1 - 30.8 %   Platelets 306 150 - 400 K/uL   nRBC 0.0 0.0 - 0.2 %   Neutrophils Relative % 70 %   Neutro Abs 4.4 1.7 - 7.7 K/uL   Lymphocytes Relative 18 %   Lymphs Abs 1.1 0.7 - 4.0 K/uL   Monocytes Relative 12 %   Monocytes Absolute 0.8 0.1 - 1.0 K/uL   Eosinophils Relative 0 %   Eosinophils Absolute 0.0 0.0 - 0.5 K/uL  Basophils Relative 0 %   Basophils Absolute 0.0 0.0 - 0.1 K/uL   Immature Granulocytes 0 %   Abs Immature Granulocytes 0.02 0.00 - 0.07 K/uL    Comment: Performed at [facility], [address] [place], [place] [address]  Troponin I (High Sensitivity)     Status: Abnormal   Collection Time: 01/06/23  6:00 PM  Result Value Ref Range   Troponin I (High Sensitivity) 177 (HH) <18 ng/L    Comment: CRITICAL RESULT CALLED TO, READ BACK BY AND VERIFIED WITH Grant Fontana, RN @ [phone] 01/06/23 BY SEKDAHL (NOTE) Elevated high sensitivity troponin I (hsTnI) values and significant  changes across serial measurements may suggest ACS but many other  chronic and acute conditions are known to elevate hsTnI results.  Refer to the "Links" section for chest pain algorithms and additional  guidance. Performed at [facility], [address], [place], [place] [address]   Magnesium     Status: None   Collection Time: 01/06/23  6:00 PM  Result Value Ref Range   Magnesium 1.8 1.7 - 2.4 mg/dL    Comment: Performed at [facility], [address] [place], [place] [address]  SARS Coronavirus 2 by RT PCR (hospital order, performed in [facility] hospital lab) *cepheid single result test* Anterior Nasal Swab     Status: None   Collection Time: 01/06/23  6:00 PM   Specimen: Anterior Nasal Swab  Result Value Ref Range   SARS Coronavirus 2 by RT PCR NEGATIVE NEGATIVE    Comment: Performed at [facility] Lab, [address], [place], [place] [address]   DG Chest 2 View  Result Date: 01/06/2023 CLINICAL DATA:  Shortness of breath. EXAM: CHEST - 2 VIEW COMPARISON:  Dec 30, 2022. FINDINGS: Stable cardiomegaly. Small bilateral pleural effusions are noted with associated bibasilar atelectasis or edema. Bony thorax is unremarkable. IMPRESSION: Bibasilar atelectasis or edema is noted with associated pleural effusions. Electronically Signed   By: Lupita Raider M.D.   On: 01/06/2023 17:39    Pending Labs Unresulted Labs (From admission, onward)     Start     Ordered   01/06/23 1712  Brain natriuretic peptide  Once,   URGENT        01/06/23 1711   Signed and Held  Basic metabolic panel  Daily,   R     Comments: As Scheduled for 5 days    Signed and Held   Signed and Held  CBC  (heparin)  Once,   R       Comments: Baseline for heparin therapy IF NOT ALREADY DRAWN.  Notify MD if PLT < 100 K.    Signed and Held   Signed and Held  Creatinine, serum  (heparin)  Once,   R       Comments: Baseline for heparin therapy IF NOT ALREADY DRAWN.    Signed and Held            Vitals/Pain Today's Vitals   01/06/23 1707 01/06/23 1710 01/06/23 1800 01/06/23 1815  BP:  (!) 166/83 132/75   Pulse: 64 63 62 62  Resp: 20 20 20 20   Temp:  98 F (36.7 C)    TempSrc:  Oral    SpO2: 93% 93% 94% 94%    Isolation Precautions No active isolations  Medications Medications - No data to display  Mobility walks     Focused Assessments Cardiac Assessment Handoff:  No results found for: "CKTOTAL", "CKMB", "CKMBINDEX", "TROPONINI" No results found for: "DDIMER" Does the Patient currently have chest pain? No    R Recommendations: See Admitting Provider Note  Report given to:   Additional Notes: Pt Aox4, walkie talky. On 3L [place] now, being admitted, previously had stent placed last Monday. NO CP, just c/o mild SOB. 22g Right hand

## 2023-01-06 NOTE — ED Triage Notes (Signed)
Pt to ED via EMS from doctors office. Per EMS pt was at follow up apt and got SOB, staff stated pt O2 sats was 70s RA they place her on [place] 2L and it increased to 94%. Pt arrives to ED Aox4, c/o SOB that first started on Saturday. Pt reports she recently had a cardiac stent placed last Wednesday. Pt denies CP and states she does not use O2 at home.

## 2023-01-07 ENCOUNTER — Encounter (HOSPITAL_COMMUNITY): Payer: Self-pay | Admitting: Cardiovascular Disease

## 2023-01-07 ENCOUNTER — Other Ambulatory Visit (HOSPITAL_COMMUNITY): Payer: Self-pay

## 2023-01-07 DIAGNOSIS — I5023 Acute on chronic systolic (congestive) heart failure: Secondary | ICD-10-CM

## 2023-01-07 LAB — CREATININE, SERUM
Creatinine, Ser: 1.02 mg/dL — ABNORMAL HIGH (ref 0.44–1.00)
GFR, Estimated: 56 mL/min — ABNORMAL LOW (ref >60)

## 2023-01-07 LAB — CBC
HCT: 42.9 % (ref 36.0–46.0)
Hemoglobin: 13.5 g/dL (ref 12.0–15.0)
MCH: 26.5 pg (ref 26.0–34.0)
MCHC: 31.5 g/dL (ref 30.0–36.0)
MCV: 84.1 fL (ref 80.0–100.0)
Platelets: 299 10*3/uL (ref 150–400)
RBC: 5.1 MIL/uL (ref 3.87–5.11)
RDW: 14.5 % (ref 11.5–15.5)
WBC: 7.9 10*3/uL (ref 4.0–10.5)
nRBC: 0 % (ref 0.0–0.2)

## 2023-01-07 LAB — HEPARIN LEVEL (UNFRACTIONATED)
Heparin Unfractionated: 0.34 IU/mL (ref 0.30–0.70)
Heparin Unfractionated: 0.36 IU/mL (ref 0.30–0.70)

## 2023-01-07 MED ORDER — SODIUM CHLORIDE 0.9% FLUSH
3.0000 mL | Freq: Two times a day (BID) | INTRAVENOUS | Status: DC
  Administered 2023-01-07 – 2023-01-08 (×2): 3 mL via INTRAVENOUS

## 2023-01-07 MED ORDER — ACETAMINOPHEN 325 MG PO TABS: 650.0000 mg | ORAL_TABLET | ORAL | Status: DC | PRN

## 2023-01-07 MED ORDER — SODIUM CHLORIDE 0.9 % IV SOLN: 250.0000 mL | INTRAVENOUS | Status: DC | PRN

## 2023-01-07 MED ORDER — HEPARIN SODIUM (PORCINE) 5000 UNIT/ML IJ SOLN
5000.0000 [IU] | Freq: Three times a day (TID) | INTRAMUSCULAR | Status: DC
  Administered 2023-01-07 – 2023-01-08 (×4): 5000 [IU] via SUBCUTANEOUS
  Filled 2023-01-07 (×4): qty 1

## 2023-01-07 MED ORDER — ONDANSETRON HCL 4 MG/2ML IJ SOLN: 4.0000 mg | Freq: Four times a day (QID) | INTRAMUSCULAR | Status: DC | PRN

## 2023-01-07 MED ORDER — SODIUM CHLORIDE 0.9% FLUSH: 3.0000 mL | INTRAVENOUS | Status: DC | PRN

## 2023-01-07 NOTE — Progress Notes (Addendum)
Rounding Note    Patient Name: Paula Wong Date of Encounter: 01/07/2023  [facility] Cardiologist: Kristeen Miss, MD   Subjective   Patient reports feeling better today. Breathing has improved, but she continues to be on supplemental oxygen. Ankle edema improving. No chest pain, palpitations, syncope, near syncope.   Inpatient Medications    Scheduled Meds:  amiodarone  400 mg Oral BID   aspirin  81 mg Oral Daily   atorvastatin  80 mg Oral Daily   clopidogrel  75 mg Oral Daily   dapagliflozin propanediol  10 mg Oral Daily   furosemide  80 mg Intravenous BID   magnesium oxide  400 mg Oral Daily   metFORMIN  500 mg Oral Q breakfast   metoprolol succinate  50 mg Oral BID   pantoprazole  20 mg Oral Daily   sacubitril-valsartan  1 tablet Oral BID   sodium chloride flush  3 mL Intravenous Q12H   spironolactone  12.5 mg Oral Daily   Continuous Infusions:  sodium chloride     heparin 900 Units/hr (01/06/23 1957)   PRN Meds: sodium chloride, acetaminophen, nitroGLYCERIN, ondansetron (ZOFRAN) IV, sodium chloride flush   Vital Signs    Vitals:   01/07/23 0312 01/07/23 0612 01/07/23 0715 01/07/23 0821  BP:  135/82 138/79 119/70  Pulse:  76 76 73  Resp:  17 16 15   Temp:  97.8 F (36.6 C) 98.1 F (36.7 C)   TempSrc:  Oral Oral   SpO2:  96% 90% 95%  Weight: 84.9 kg 82.9 kg    Height:        Intake/Output Summary (Last 24 hours) at 01/07/2023 1009 Last data filed at 01/07/2023 0618 Gross per 24 hour  Intake --  Output 1300 ml  Net -1300 ml      01/07/2023    6:12 AM 01/07/2023    3:12 AM 01/06/2023    8:16 PM  Last 3 Weights  Weight (lbs) 182 lb 11.2 oz 187 lb 2.7 oz 187 lb 2.7 oz  Weight (kg) 82.872 kg 84.9 kg 84.9 kg      Telemetry    NSR - Personally Reviewed  ECG    No new tracings - Personally Reviewed  Physical Exam   GEN: No acute distress.  Sitting upright in the bed wearing [place]  Neck: No JVD Cardiac: RRR, no murmurs, rubs, or  gallops. Radial pulses 2+ bilaterally  Respiratory: Fine Crackles in bilateral lung bases. Normal work of breathing on 2 L supplemental oxgyen  GI: Soft, nontender, non-distended  MS: Trace edema in BLE; No deformity. Neuro:  Nonfocal  Psych: Normal affect   Labs    High Sensitivity Troponin:   Recent Labs  Lab 12/30/22 2236 12/31/22 0053 01/06/23 1800 01/06/23 1958  TROPONINIHS 10 12 177* 160*     Chemistry Recent Labs  Lab 01/01/23 0303 01/01/23 1514 01/02/23 0233 01/06/23 1800 01/07/23 0414  NA 140 139 143 141  --   K 3.4* 4.1 3.9 4.5  --   CL 103 106 106 104  --   CO2 27 23 21* 22  --   GLUCOSE 96 121* 88 120*  --   BUN 11 13 12 12   --   CREATININE 0.99 1.05* 1.07* 1.05* 1.02*  CALCIUM 8.9 9.1 9.3 9.3  --   MG 2.0  --  2.0 1.8  --   PROT  --  7.1  --   --   --   ALBUMIN  --  3.4*  --   --   --   AST  --  29  --   --   --   ALT  --  12  --   --   --   ALKPHOS  --  57  --   --   --   BILITOT  --  1.4*  --   --   --   GFRNONAA 58* 54* 53* 54* 56*  ANIONGAP 10 10 16* 15  --     Lipids No results for input(s): "CHOL", "TRIG", "HDL", "LABVLDL", "LDLCALC", "CHOLHDL" in the last 168 hours.  Hematology Recent Labs  Lab 01/02/23 0233 01/06/23 1800 01/07/23 0131  WBC 4.7 6.3 7.9  RBC 5.26* 4.97 5.10  HGB 13.9 12.9 13.5  HCT 43.3 41.8 42.9  MCV 82.3 84.1 84.1  MCH 26.4 26.0 26.5  MCHC 32.1 30.9 31.5  RDW 14.2 14.5 14.5  PLT 283 306 299   Thyroid  Recent Labs  Lab 01/01/23 0303  TSH 3.959    BNP Recent Labs  Lab 01/06/23 1800  BNP 3,948.7*    DDimer No results for input(s): "DDIMER" in the last 168 hours.   Radiology    DG Chest 2 View  Result Date: 01/06/2023 CLINICAL DATA:  Shortness of breath. EXAM: CHEST - 2 VIEW COMPARISON:  Dec 30, 2022. FINDINGS: Stable cardiomegaly. Small bilateral pleural effusions are noted with associated bibasilar atelectasis or edema. Bony thorax is unremarkable. IMPRESSION: Bibasilar atelectasis or edema is noted  with associated pleural effusions. Electronically Signed   By: Lupita Raider M.D.   On: 01/06/2023 17:39    Cardiac Studies   Left Heart Catheterization 12/31/22   Mid Cx lesion is 5% stenosed.   Ost LAD to Prox LAD lesion is 40% stenosed.   Mid LAD-2 lesion is 30% stenosed.   Ost LM lesion is 40% stenosed.   Mid LM to Dist LM lesion is 30% stenosed.   RV Branch lesion is 70% stenosed.   Prox LAD to Mid LAD lesion is 85% stenosed.   Mid LAD-1 lesion is 85% stenosed.   A drug-eluting stent was successfully placed using a SYNERGY XD 3.0X24.   Post intervention, there is a 0% residual stenosis.   Post intervention, there is a 0% residual stenosis.   LV end diastolic pressure is moderately elevated.   2 vessel obstructive CAD with severe LAD stenosis sequentially with heavy calcification. Moderate RCA disease diffusely in a small branch Moderately elevated LVEDP 25 mm Hg Successful PCI of the mid LAD using IVUS guidance, Shockwave intracoronary lithotripsy and DES x 1.   Plan: DAPT for one year. Anticipate possible DC tomorrow.   Patient Profile     77 y.o. female with a past medical history of CVA, CAD, chronic combined CHF, history of V-tach noted on implantable loop recorder    Assessment & Plan    Acute on Chronic Combined CHF  Ischemic Cardiomyopathy  - Most recent echocardiogram from 11/2022 showed EF 25-30%, normal RV systolic function, mild-moderate mitral valve regurgitation - Patient was seen in clinic yesterday by Dr. Elease Hashimoto- patient complained of progressive dyspnea, orthopnea, early satiety, hypoxia (sats in the 80s)  - Patient was transported to the ED where BNP was elevated to 3948 - CXR showed bibasilar atelectasis or edema with associated pleural effusions - Patient has received two doses of IV lasix 80 mg. Put out 1.3 L urine overnight. Renal function stable  - Patient appears close to euvolemic on  exam. Continues to have trace ankle edema in BLE, crackles in lung  bases - Continue IV lasix for now, but I anticipate she can be transitioned to PO lasix tomorrow  - Strict I/Os, daily weights. Follow renal function and electrolytes with diuresis  - Continue metoprolol succinate 50 mg BID, entresto 49/51 mg BID, farxiga 10 mg daily  - Started spironolactone 12.5 mg daily   CAD  Demand ischemia  HLD  - Most recent cardiac catheterization from 12/31/22 showed 2 vessel obstructive disease with severe LAD stenosis sequentially with heavy calcification. There was moderate RCA disease diffusely. Patient was treated with shockwave intracoronary lithotripsy and DES x2 in the mid LAD  - Now, patient presents with CHF exacerbation. hsTn 177>160. No chest pain  - Trop trend is mild and flat/down trending. Suspect this is demand ischemia in the setting of CHF  - Continue ASA, plavix  - Continue lipitor 80 mg daily  - Continue metoprolol succinate 50 mg BID   VT - Patient was recently admitted from 5/13 - 01/02/23 for V-tach - V-tach occurred int he setting of ischemia, and patient is now s/p PCI of the LAD  - No syncope/ near syncope  - No VT on telemetry  - Continue metoprolol  - Maintain K>4, mag >2   For questions or updates, please contact [facility] Please consult [email or web address] for contact info under        Signed, Jonita Albee, PA-C  01/07/2023, 10:09 AM    Patient seen and examined with KJ PA-C.  Agree as above, with the following exceptions and changes as noted below. Feels like she is improving but continues to require O2. Gen: NAD, CV: RRR, no murmurs, Lungs: clear, Abd: soft, Extrem: Warm, well perfused, trace ankle edema, Neuro/Psych: alert and oriented x 3, normal mood and affect. All available labs, radiology testing, previous records reviewed. Continue IV diuresis today as above and will reassess tomorrow. Strict I/o.  Parke Poisson, MD 01/07/23 2:29 PM

## 2023-01-07 NOTE — Progress Notes (Signed)
Heart Failure Nurse Navigator Progress Note  PCP: Loura Back, NP PCP-Cardiologist: Nahser Admission Diagnosis: Shortness of breath Admitted from: Doctors office via EMS  Presentation:   Paula Wong presented with shortness of breath while at her doctors office. Patient reported she had a heart Cath with stent placement last week, 2-3 days after she was experiencing shortness of breath and couldn't lay flat. She waited until her cardiologist appointment on Monday to do anything about it,her oxygen saturations were in the 70's, from there her doctor had her admitted to the hospital. BNP 3,948, Troponin 177, BP 164/89, HR 61. Placed on 3 l O2 and sats returned to mid 90's. IV lasix given.   Patient a very pleasant and independent 77 year old, whom lives at [facility] a senior community. Was educated on the sign and symptoms of heart failure, daily weights, when to call her doctor or go to the ED. Diet/ fluid restrictions, taking all medications as prescribed, reported to using a pill box for her medications and attends all her medical appointments. Patient verbalized her understanding, a HF TOC appointment was scheduled for 01/24/2023 @ 9 am.   ECHO/ LVEF: 25-30%  Clinical Course:  Past Medical History:  Diagnosis Date   Allergy    CAD (coronary artery disease)    a. 03/2022 Cath: LM 40ost, 22m, LAD 40ost/p, 90m, 48m, LCX 18m, RCA nl, RV branch 70-->Med rx.   Chronic HFrEF (heart failure with reduced ejection fraction) (HCC)    a. 11/2021 Echo: EF 20-25%; b. 03/2022 Echo: EF 20-25%; c. 11/2022 Echo: EF 25-30%, glob HK.   Diabetes (HCC)    Diverticulosis    GERD (gastroesophageal reflux disease)    Hiatal hernia    Hyperlipidemia    Hypertension    Implantable loop recorder present    a. 03/2022 s/p Abbott IQ EL+ (serial [id number]) ILR in setting of cryptogenic stroke.   Ischemic cardiomyopathy    a. 11/2021 Echo: EF 20-25%; b. 03/2022 Echo: EF 20-25%; c. 11/2022 Echo: EF 25-30%, glob HK, nl  RV fxn, mild-mod MR, mild AI, AoV sclerosis.   Neuropathy    feet   Schatzki's ring    Stroke [facility])    a. 03/2022 MRI Brain: subacute cortical infarct involving inf and med L occipial pole w/ petechial hemorrhage; b. 03/2022 s/p Abbott Assert IQ EL ILR.   Ventricular tachycardia (HCC)    a. 12/2022 noted on ILR.     Social History   Socioeconomic History   Marital status: Single    Spouse name: Not on file   Number of children: Not on file   Years of education: Not on file   Highest education level: Not on file  Occupational History   Occupation: Retired  Tobacco Use   Smoking status: Former    Types: Cigarettes    Quit date: 08/20/2003    Years since quitting: 19.3   Smokeless tobacco: Never  Vaping Use   Vaping Use: Never used  Substance and Sexual Activity   Alcohol use: No   Drug use: No   Sexual activity: Not on file  Other Topics Concern   Not on file  Social History Narrative   Not on file   Social Determinants of Health   Financial Resource Strain: Not on file  Food Insecurity: No Food Insecurity (12/30/2022)   Hunger Vital Sign    Worried About Running Out of Food in the Last Year: Never true    Ran Out of Food in the Last  Year: Never true  Transportation Needs: No Transportation Needs (12/30/2022)   PRAPARE - Administrator, Civil Service (Medical): No    Lack of Transportation (Non-Medical): No  Physical Activity: Not on file  Stress: Not on file  Social Connections: Not on file   Education Assessment and Provision:  Detailed education and instructions provided on heart failure disease management including the following:  Signs and symptoms of Heart Failure When to call the physician Importance of daily weights Low sodium diet Fluid restriction Medication management Anticipated future follow-up appointments  Patient education given on each of the above topics.  Patient acknowledges understanding via teach back method and acceptance of all  instructions.  Education Materials:  "Living Better With Heart Failure" Booklet, HF zone tool, & Daily Weight Tracker Tool.  Patient has scale at home: Yes Patient has pill box at home: Yes    High Risk Criteria for Readmission and/or Poor Patient Outcomes: Heart failure hospital admissions (last 6 months): 0  No Show rate: 7% Difficult social situation: No, Lives at [facility] retirement community.  Demonstrates medication adherence: Yes Primary Language: english Literacy level: Reading, writing, and comprehension  Barriers of Care:   Daily weights Diet/ fluid restrictions  Considerations/Referrals:   Referral made to Heart Failure Pharmacist Stewardship: Yes Referral made to Heart Failure CSW/NCM TOC: No Referral made to Heart & Vascular TOC clinic: Yes, 01/24/2023 @ 9 am  Items for Follow-up on DC/TOC: Continued HF education Diet/ fluid restrictions/ daily weights   Rhae Hammock, BSN, RN Heart Failure Teacher, adult education Only

## 2023-01-07 NOTE — Progress Notes (Signed)
ANTICOAGULATION CONSULT NOTE - Initial Consult  Pharmacy Consult for heparin Indication: chest pain/ACS  No Known Allergies  Patient Measurements: Height: 5\' 5"  (165.1 cm) Weight: 82.9 kg (182 lb 11.2 oz) IBW/kg (Calculated) : 57 Heparin Dosing Weight: 74.6 kg  Vital Signs: Temp: 98.1 F (36.7 C) (05/21 0715) Temp Source: Oral (05/21 0715) BP: 119/70 (05/21 0821) Pulse Rate: 73 (05/21 0821)  Labs: Recent Labs    01/06/23 1800 01/06/23 1958 01/07/23 0131 01/07/23 0414 01/07/23 0932  HGB 12.9  --  13.5  --   --   HCT 41.8  --  42.9  --   --   PLT 306  --  299  --   --   HEPARINUNFRC  --   --  0.34  --  0.36  CREATININE 1.05*  --   --  1.02*  --   TROPONINIHS 177* 160*  --   --   --      Estimated Creatinine Clearance: 48.4 mL/min (A) (by C-G formula based on SCr of 1.02 mg/dL (H)).   Medical History: Past Medical History:  Diagnosis Date   Allergy    CAD (coronary artery disease)    a. 03/2022 Cath: LM 40ost, 13m, LAD 40ost/p, 8m, 30m, LCX 41m, RCA nl, RV branch 70-->Med rx.   Chronic HFrEF (heart failure with reduced ejection fraction) (HCC)    a. 11/2021 Echo: EF 20-25%; b. 03/2022 Echo: EF 20-25%; c. 11/2022 Echo: EF 25-30%, glob HK.   Diabetes (HCC)    Diverticulosis    GERD (gastroesophageal reflux disease)    Hiatal hernia    Hyperlipidemia    Hypertension    Implantable loop recorder present    a. 03/2022 s/p Abbott IQ EL+ (serial [id number]) ILR in setting of cryptogenic stroke.   Ischemic cardiomyopathy    a. 11/2021 Echo: EF 20-25%; b. 03/2022 Echo: EF 20-25%; c. 11/2022 Echo: EF 25-30%, glob HK, nl RV fxn, mild-mod MR, mild AI, AoV sclerosis.   Neuropathy    feet   Schatzki's ring    Stroke [facility])    a. 03/2022 MRI Brain: subacute cortical infarct involving inf and med L occipial pole w/ petechial hemorrhage; b. 03/2022 s/p Abbott Assert IQ EL ILR.   Ventricular tachycardia (HCC)    a. 12/2022 noted on ILR.    Medications:  Medications Prior to  Admission  Medication Sig Dispense Refill Last Dose   amiodarone (PACERONE) 200 MG tablet Take 2 tablets (400 mg total) by mouth 2 (two) times daily for 14 days, THEN 1 tablet (200 mg total) 2 (two) times daily for 14 days, THEN 1 tablet (200 mg total) daily thereafter 94 tablet 0 01/06/2023   aspirin 81 MG chewable tablet Chew 1 tablet (81 mg total) by mouth daily. 90 tablet 3 01/06/2023   atorvastatin (LIPITOR) 80 MG tablet Take 80 mg by mouth at bedtime.   Past Week   clopidogrel (PLAVIX) 75 MG tablet Take 1 tablet (75 mg total) by mouth daily. 30 tablet 11 01/06/2023   FARXIGA 10 MG TABS tablet Take 10 mg by mouth daily.   01/06/2023   gabapentin (NEURONTIN) 600 MG tablet Take 600 mg by mouth 2 (two) times daily.   01/06/2023   magnesium oxide (MAG-OX) 400 MG tablet Take 400 mg by mouth daily.   01/06/2023   metFORMIN (GLUCOPHAGE-XR) 500 MG 24 hr tablet Take 500 mg by mouth daily with breakfast.   01/06/2023   metoprolol succinate (TOPROL-XL) 50 MG 24 hr tablet Take  50 mg by mouth 2 (two) times daily.   01/06/2023 at 1000   Multiple Vitamin (MULTIVITAMIN) tablet Take 1 tablet by mouth daily.   01/06/2023   nitroGLYCERIN (NITROSTAT) 0.4 MG SL tablet Place 1 tablet (0.4 mg total) under the tongue every 5 (five) minutes x 3 doses as needed for chest pain. 25 tablet 2    OZEMPIC, 1 MG/DOSE, 4 MG/3ML SOPN Inject 1 mg into the skin once a week.   Past Week   pantoprazole (PROTONIX) 20 MG tablet Take 1 tablet (20 mg total) by mouth daily. 90 tablet 3 01/06/2023   potassium chloride (KLOR-CON M) 10 MEQ tablet Take 1 tablet (10 mEq total) by mouth daily. 30 tablet 5 01/06/2023   sacubitril-valsartan (ENTRESTO) 49-51 MG Take 1 tablet by mouth 2 (two) times daily. 60 tablet 11 01/06/2023   spironolactone (ALDACTONE) 25 MG tablet Take 12.5 mg by mouth daily.   01/06/2023   TRESIBA FLEXTOUCH 200 UNIT/ML SOPN Take 80 Units by mouth at bedtime.    Past Week   Scheduled:   amiodarone  400 mg Oral BID   aspirin  81 mg  Oral Daily   atorvastatin  80 mg Oral Daily   clopidogrel  75 mg Oral Daily   dapagliflozin propanediol  10 mg Oral Daily   furosemide  80 mg Intravenous BID   magnesium oxide  400 mg Oral Daily   metFORMIN  500 mg Oral Q breakfast   metoprolol succinate  50 mg Oral BID   pantoprazole  20 mg Oral Daily   sacubitril-valsartan  1 tablet Oral BID   sodium chloride flush  3 mL Intravenous Q12H   spironolactone  12.5 mg Oral Daily   Infusions:   sodium chloride     heparin 900 Units/hr (01/06/23 1957)    Assessment: Patient with history of CVA, CHF, and CAD with stent ti LAD on 12/31/22 presented from clinic with shortness of breath. Pharmacy consulted to dose heparin. Not to anticoagulation PTA.  Heparin continue to be therapeutic today. CBC stable  Goal of Therapy:  Heparin level 0.3-0.7 units/ml Monitor platelets by anticoagulation protocol: Yes   Plan:  Cont heparin at 900 units/hr Daily heparin level and CBC  Ulyses Southward, PharmD, BCIDP, AAHIVP, CPP Infectious Disease Pharmacist 01/07/2023 11:23 AM

## 2023-01-07 NOTE — Progress Notes (Signed)
ANTICOAGULATION CONSULT NOTE Pharmacy Consult for heparin Indication: chest pain/ACS Brief A/P: Heparin level within goal range Continue Heparin at current rate   No Known Allergies  Patient Measurements: Height: 5\' 5"  (165.1 cm) Weight: 84.9 kg (187 lb 2.7 oz) IBW/kg (Calculated) : 57 Heparin Dosing Weight: 74.6 kg  Vital Signs: Temp: 98 F (36.7 C) (05/21 0051) Temp Source: Oral (05/21 0051) BP: 142/82 (05/21 0051) Pulse Rate: 77 (05/21 0051)  Labs: Recent Labs    01/06/23 1800 01/06/23 1958 01/07/23 0131  HGB 12.9  --  13.5  HCT 41.8  --  42.9  PLT 306  --  299  HEPARINUNFRC  --   --  0.34  CREATININE 1.05*  --   --   TROPONINIHS 177* 160*  --      Estimated Creatinine Clearance: 47.5 mL/min (A) (by C-G formula based on SCr of 1.05 mg/dL (H)).  Assessment: 77 y.o. female with SOB and possible ACS for heparin  Goal of Therapy:  Heparin level 0.3-0.7 units/ml Monitor platelets by anticoagulation protocol: Yes   Plan:  No change to heparin   Geannie Risen, PharmD, BCPS

## 2023-01-07 NOTE — Progress Notes (Signed)
   Discussed with Dr. Jacques Navy- patient has been on IV heparin for ACS. hsTn 177>160. This is more consistent with demand ischemia. OK to stop heparin   Jonita Albee, PA-C 01/07/2023 2:42 PM

## 2023-01-07 NOTE — Progress Notes (Signed)
   Heart Failure Stewardship Pharmacist Progress Note   PCP: Loura Back, NP PCP-Cardiologist: Kristeen Miss, MD   HPI:  77 YO female with a PMH of  Patient was brought to the ED from her cardiologist's office due to shortness of breath, SPO2 70s (improved with 2L [place] but does not use O2 at home). She was discharged from the hospital 4 days ago after having a cardiac stent placed into the LAD. At that time, patient was also noted to have asymptomatic VT found on her loop monitor. Echo from 11/2022 revealed severe LV dysfunction with EF of 25 to 30%, global hypokinesis, mild-mod MVR, RAP 3 mmHg. This admission, BNP 3948, HsTrop 177 (was 12 on last admission), CXR showing edema.   Current HF Medications: Diuretic: furosemide 80 mg IV BID Beta Blocker: metoprolol succinate 50 mg BID ACE/ARB/ARNI: Entresto 49/51 mg BID   Prior to admission HF Medications: Beta blocker: metoprolol succinate 50 mg BID ACE/ARB/ARNI: Entresto 49/51 mg BID MRA: spironolactone 12.5 mg daily SGLT2i: Farxiga 10 mg daily  Pertinent Lab Values: Serum creatinine 1.02, BUN 12, Potassium 4.5, Sodium 141, BNP 3948, Magnesium 1.8, A1c 6.3% (8 months ago)  Vital Signs: Weight: 182 lbs (admission weight: 187 lbs) Blood pressure: 130s/80s (variable)  Heart rate: 70s  I/O: not recorded yesterday; net -1.3L  Medication Assistance / Insurance Benefits Check: Does the patient have prescription insurance?  Yes Type of insurance plan: Medicare  Does the patient qualify for medication assistance through manufacturers or grants?   Yes Eligible grants and/or patient assistance programs: None currently Medication assistance applications in progress: None  Medication assistance applications approved: None Approved medication assistance renewals will be completed by: N/A  Outpatient Pharmacy:  Prior to admission outpatient pharmacy: [facility] Is the patient willing to use [facility] TOC pharmacy at discharge? Yes Is the patient  willing to transition their outpatient pharmacy to utilize a [facility] outpatient pharmacy?   No    Assessment: 1. Acute on chronic systolic CHF (LVEF 25-30%), due to ICM. NYHA class III symptoms. - Scr stable at 1.02 (BL ~1), on furosemide 80 mg IV BID. Strict I's and O's. Keep K >4 and Mg > 2. Recommend Mg supplementation.  - Agree with continuing IV furosemide 80 mg IV BID today given continued edema  - Consider transitioning from IV furosemide to PO furosemide tomorrow pending UOP and volume status  - Continue metoprolol succinate 50 mg BID  - Continue Entresto 49/51 mg BID  - Agree with starting PTA Farxiga 10 mg daily  - Agree with starting PTA spironolactone 12.5 mg daily  - Consider PRN furosemide at discharge    Plan: 1) Medication changes recommended at this time: - Start PTA Farxiga 10 mg daily  - Start PTA spironolactone 12.5 mg daily and consider increasing to 25 mg daily tomorrow if further BP decrease is needed  - Start PTA mag-ox 400mg  daily - Consider PRN furosemide at discharge - F/u Mg in the AM   2) Patient assistance: Sherryll Burger - $0 - Jardiance - $0  3)  Education  - Patient has been educated on current HF medications and potential additions to HF medication regimen - Patient verbalizes understanding that over the next few months, these medication doses may change and more medications may be added to optimize HF regimen - Patient has been educated on basic disease state pathophysiology and goals of therapy   Cherylin Mylar, PharmD PGY1 Pharmacy Resident 5/21/202410:39 AM

## 2023-01-07 NOTE — TOC CM/SW Note (Signed)
Transition of Care [facility]) - Inpatient Brief Assessment   Patient Details  Name: Deundra Hullum MRN: [id number] Date of Birth: [birth year]  [facility]) CM/SW Contact:    Leone Haven, RN Phone Number: 01/07/2023, 12:49 PM   Clinical Narrative: Patient is from home alone, PTA indep from home  in apartment, she has transportation to her [place] apts thru DSS, she is on oxygen, may need home oxygen, TOC will continue to follow for oxygen needs.   Transition of Care Asessment: Insurance and Status: Insurance coverage has been reviewed Patient has primary care physician: Yes Home environment has been reviewed: self ambulatory Prior level of function:: independent Prior/Current Home Services: No current home services Social Determinants of Health Reivew: SDOH reviewed no interventions necessary Readmission risk has been reviewed: Yes Transition of care needs: transition of care needs identified, TOC will continue to follow

## 2023-01-08 ENCOUNTER — Other Ambulatory Visit (HOSPITAL_COMMUNITY): Payer: Self-pay

## 2023-01-08 ENCOUNTER — Other Ambulatory Visit: Payer: Self-pay | Admitting: Cardiology

## 2023-01-08 DIAGNOSIS — I472 Ventricular tachycardia, unspecified: Secondary | ICD-10-CM

## 2023-01-08 LAB — BASIC METABOLIC PANEL
Anion gap: 12 (ref 5–15)
BUN: 14 mg/dL (ref 8–23)
CO2: 27 mmol/L (ref 22–32)
Calcium: 9.4 mg/dL (ref 8.9–10.3)
Chloride: 98 mmol/L (ref 98–111)
Creatinine, Ser: 1.13 mg/dL — ABNORMAL HIGH (ref 0.44–1.00)
GFR, Estimated: 50 mL/min — ABNORMAL LOW (ref >60)
Glucose, Bld: 88 mg/dL (ref 70–99)
Potassium: 3.3 mmol/L — ABNORMAL LOW (ref 3.5–5.1)
Sodium: 137 mmol/L (ref 135–145)

## 2023-01-08 LAB — CBC
HCT: 45.9 % (ref 36.0–46.0)
Hemoglobin: 14.7 g/dL (ref 12.0–15.0)
MCH: 25.9 pg — ABNORMAL LOW (ref 26.0–34.0)
MCHC: 32 g/dL (ref 30.0–36.0)
MCV: 80.8 fL (ref 80.0–100.0)
Platelets: 349 10*3/uL (ref 150–400)
RBC: 5.68 MIL/uL — ABNORMAL HIGH (ref 3.87–5.11)
RDW: 14.4 % (ref 11.5–15.5)
WBC: 7.5 10*3/uL (ref 4.0–10.5)
nRBC: 0 % (ref 0.0–0.2)

## 2023-01-08 LAB — HEPARIN LEVEL (UNFRACTIONATED): Heparin Unfractionated: <0.1 IU/mL — ABNORMAL LOW (ref 0.30–0.70)

## 2023-01-08 LAB — MAGNESIUM: Magnesium: 1.9 mg/dL (ref 1.7–2.4)

## 2023-01-08 MED ORDER — FUROSEMIDE 40 MG PO TABS
40.0000 mg | ORAL_TABLET | Freq: Every day | ORAL | Status: DC
  Administered 2023-01-08: 40 mg via ORAL
  Filled 2023-01-08: qty 1

## 2023-01-08 MED ORDER — FUROSEMIDE 40 MG PO TABS
40.0000 mg | ORAL_TABLET | Freq: Every day | ORAL | 3 refills | Status: DC
  Filled 2023-01-08: qty 90, 90d supply, fill #0

## 2023-01-08 MED ORDER — POTASSIUM CHLORIDE CRYS ER 20 MEQ PO TBCR
60.0000 meq | EXTENDED_RELEASE_TABLET | Freq: Once | ORAL | Status: AC
  Administered 2023-01-08: 60 meq via ORAL
  Filled 2023-01-08: qty 3

## 2023-01-08 NOTE — Progress Notes (Addendum)
Rounding Note    Patient Name: Paula Wong Date of Encounter: 01/08/2023  [facility] Cardiologist: Kristeen Miss, MD   Subjective   Patient denies chest pain, palpitations, shortness of breath, ankle edema, abdominal distension, orthopnea. Now on room air.   Inpatient Medications    Scheduled Meds:  amiodarone  400 mg Oral BID   aspirin  81 mg Oral Daily   atorvastatin  80 mg Oral Daily   clopidogrel  75 mg Oral Daily   dapagliflozin propanediol  10 mg Oral Daily   furosemide  80 mg Intravenous BID   heparin  5,000 Units Subcutaneous Q8H   magnesium oxide  400 mg Oral Daily   metFORMIN  500 mg Oral Q breakfast   metoprolol succinate  50 mg Oral BID   pantoprazole  20 mg Oral Daily   sacubitril-valsartan  1 tablet Oral BID   sodium chloride flush  3 mL Intravenous Q12H   spironolactone  12.5 mg Oral Daily   Continuous Infusions:  sodium chloride     PRN Meds: sodium chloride, acetaminophen, nitroGLYCERIN, ondansetron (ZOFRAN) IV, sodium chloride flush   Vital Signs    Vitals:   01/08/23 0109 01/08/23 0453 01/08/23 0504 01/08/23 0758  BP: 117/73 125/78  112/69  Pulse: 70 76 72 75  Resp: 15 15  18   Temp: 98.1 F (36.7 C) 98.1 F (36.7 C)  98.3 F (36.8 C)  TempSrc: Oral Oral  Oral  SpO2: 91% (!) 89% 92% 94%  Weight:  80.8 kg    Height:        Intake/Output Summary (Last 24 hours) at 01/08/2023 1031 Last data filed at 01/08/2023 0759 Gross per 24 hour  Intake 933.13 ml  Output 2450 ml  Net -1516.87 ml      01/08/2023    4:53 AM 01/07/2023    6:12 AM 01/07/2023    3:12 AM  Last 3 Weights  Weight (lbs) 178 lb 2.1 oz 182 lb 11.2 oz 187 lb 2.7 oz  Weight (kg) 80.8 kg 82.872 kg 84.9 kg      Telemetry    NSR - Personally Reviewed  ECG    No new tracings since 5/20 - Personally Reviewed  Physical Exam   GEN: No acute distress. Sitting upright in the armchair    Neck: No JVD Cardiac: RRR, no murmurs, rubs, or gallops. Radial pulses  2+ bilaterally  Respiratory: Clear to auscultation bilaterally. Normal work of breathing on room air GI: Soft, nontender, non-distended  MS: No edema in BLE; No deformity. Neuro:  Nonfocal  Psych: Normal affect   Labs    High Sensitivity Troponin:   Recent Labs  Lab 12/30/22 2236 12/31/22 0053 01/06/23 1800 01/06/23 1958  TROPONINIHS 10 12 177* 160*     Chemistry Recent Labs  Lab 01/01/23 1514 01/02/23 0233 01/06/23 1800 01/07/23 0414 01/08/23 0101  NA 139 143 141  --  137  K 4.1 3.9 4.5  --  3.3*  CL 106 106 104  --  98  CO2 23 21* 22  --  27  GLUCOSE 121* 88 120*  --  88  BUN 13 12 12   --  14  CREATININE 1.05* 1.07* 1.05* 1.02* 1.13*  CALCIUM 9.1 9.3 9.3  --  9.4  MG  --  2.0 1.8  --  1.9  PROT 7.1  --   --   --   --   ALBUMIN 3.4*  --   --   --   --  AST 29  --   --   --   --   ALT 12  --   --   --   --   ALKPHOS 57  --   --   --   --   BILITOT 1.4*  --   --   --   --   GFRNONAA 54* 53* 54* 56* 50*  ANIONGAP 10 16* 15  --  12    Lipids No results for input(s): "CHOL", "TRIG", "HDL", "LABVLDL", "LDLCALC", "CHOLHDL" in the last 168 hours.  Hematology Recent Labs  Lab 01/06/23 1800 01/07/23 0131 01/08/23 0101  WBC 6.3 7.9 7.5  RBC 4.97 5.10 5.68*  HGB 12.9 13.5 14.7  HCT 41.8 42.9 45.9  MCV 84.1 84.1 80.8  MCH 26.0 26.5 25.9*  MCHC 30.9 31.5 32.0  RDW 14.5 14.5 14.4  PLT 306 299 349   Thyroid No results for input(s): "TSH", "FREET4" in the last 168 hours.  BNP Recent Labs  Lab 01/06/23 1800  BNP 3,948.7*    DDimer No results for input(s): "DDIMER" in the last 168 hours.   Radiology    DG Chest 2 View  Result Date: 01/06/2023 CLINICAL DATA:  Shortness of breath. EXAM: CHEST - 2 VIEW COMPARISON:  Dec 30, 2022. FINDINGS: Stable cardiomegaly. Small bilateral pleural effusions are noted with associated bibasilar atelectasis or edema. Bony thorax is unremarkable. IMPRESSION: Bibasilar atelectasis or edema is noted with associated pleural effusions.  Electronically Signed   By: Lupita Raider M.D.   On: 01/06/2023 17:39    Cardiac Studies   Left Heart Catheterization 12/31/22   Mid Cx lesion is 5% stenosed.   Ost LAD to Prox LAD lesion is 40% stenosed.   Mid LAD-2 lesion is 30% stenosed.   Ost LM lesion is 40% stenosed.   Mid LM to Dist LM lesion is 30% stenosed.   RV Branch lesion is 70% stenosed.   Prox LAD to Mid LAD lesion is 85% stenosed.   Mid LAD-1 lesion is 85% stenosed.   A drug-eluting stent was successfully placed using a SYNERGY XD 3.0X24.   Post intervention, there is a 0% residual stenosis.   Post intervention, there is a 0% residual stenosis.   LV end diastolic pressure is moderately elevated.   2 vessel obstructive CAD with severe LAD stenosis sequentially with heavy calcification. Moderate RCA disease diffusely in a small branch Moderately elevated LVEDP 25 mm Hg Successful PCI of the mid LAD using IVUS guidance, Shockwave intracoronary lithotripsy and DES x 1.   Plan: DAPT for one year. Anticipate possible DC tomorrow.   Patient Profile     77 y.o. female  with a past medical history of CVA, CAD, chronic combined CHF, history of V-tach noted on implantable loop recorder   Assessment & Plan    Acute on Chronic Combined CHF  Ischemic Cardiomyopathy  - Most recent echocardiogram from 11/2022 showed EF 25-30%, normal RV systolic function, mild-moderate mitral valve regurgitation - Patient was seen in clinic 5/20 by Dr. Elease Hashimoto- patient complained of progressive dyspnea, orthopnea, early satiety, hypoxia (sats in the 80s)  - Patient was transported to the ED where BNP was elevated to 3948 - CXR showed bibasilar atelectasis or edema with associated pleural effusions - Patient has been on IV lasix- put out 2.45 L urine yesterday and is currently net -3 L since admission  - She appears euvolemic on exam. Transition from IV lasix to PO lasix 40 mg daily  -  Strict I/Os, daily weights. Follow renal function and  electrolytes with diuresis  - Continue metoprolol succinate 50 mg BID, entresto 49/51 mg BID, farxiga 10 mg daily, spironolactone 12.5 mg daily    CAD  Demand ischemia  HLD  - Most recent cardiac catheterization from 12/31/22 showed 2 vessel obstructive disease with severe LAD stenosis sequentially with heavy calcification. There was moderate RCA disease diffusely. Patient was treated with shockwave intracoronary lithotripsy and DES x2 in the mid LAD  - Now, patient presents with CHF exacerbation. hsTn 177>160. No chest pain  - Trop trend is mild and flat/down trending. Suspect this is demand ischemia in the setting of CHF  - Continue ASA, plavix  - Continue lipitor 80 mg daily  - Continue metoprolol succinate 50 mg BID    VT - Patient was recently admitted from 5/13 - 01/02/23 for V-tach - V-tach occurred int he setting of ischemia, and patient is now s/p PCI of the LAD  - No syncope/ near syncope  - No VT on telemetry  - Continue metoprolol and amiodarone taper  - Amiodarone 400 mg BID until 5/30 followed by 200 mg BID for the next 2 weeks followed by 200 mg daily  - Maintain K>4, mag >2   For questions or updates, please contact [facility] Please consult [email or web address] for contact info under        Signed, Jonita Albee, PA-C  01/08/2023, 10:31 AM    Patient seen and examined with KJ PA-C.  Agree as above, with the following exceptions and changes as noted below. No CP or sob, feels back to baseline. Gen: NAD, CV: RRR, no murmurs, Lungs: clear, Abd: soft, Extrem: Warm, well perfused, no edema, Neuro/Psych: alert and oriented x 3, normal mood and affect. All available labs, radiology testing, previous records reviewed. Will transition to oral lasix, ambulate in halls with amb sat and likely home tomorrow am.  Parke Poisson, MD 01/08/23 11:34 AM

## 2023-01-08 NOTE — TOC Transition Note (Signed)
TOC discharge meds READY and LOCATED in TOC pharmacy on 2nd floor awaiting pt discharge. 

## 2023-01-08 NOTE — Progress Notes (Signed)
Patient ambulated with RN in hallway on room air. O2 sats at 94% at rest and 96% while ambulating. Patient able to walk entire length of hallway with no SOB, dizziness, or any other symptoms.

## 2023-01-08 NOTE — Progress Notes (Signed)
   Heart Failure Stewardship Pharmacist Progress Note   PCP: Loura Back, NP PCP-Cardiologist: Kristeen Miss, MD   HPI:  77 YO female with a PMH of  Patient was brought to the ED from her cardiologist's office due to shortness of breath, SPO2 70s (improved with 2L [place] but does not use O2 at home). She was discharged from the hospital 4 days ago after having a cardiac stent placed into the LAD. At that time, patient was also noted to have asymptomatic VT found on her loop monitor. Echo from 11/2022 revealed severe LV dysfunction with EF of 25 to 30%, global hypokinesis, mild-mod MVR, RAP 3 mmHg. This admission, BNP 3948, HsTrop 177 (was 12 on last admission), CXR showing edema.   Current HF Medications: Diuretic: furosemide 80 mg IV BID Beta Blocker: metoprolol succinate 50 mg BID ACE/ARB/ARNI: Entresto 49/51 mg BID  MRA: spironolactone 12.5 mg daily SGLT2i: Farxiga 10 mg daily  Other: Mag-ox 400 mg daily  Prior to admission HF Medications: Beta blocker: metoprolol succinate 50 mg BID ACE/ARB/ARNI: Entresto 49/51 mg BID MRA: spironolactone 12.5 mg daily SGLT2i: Farxiga 10 mg daily  Pertinent Lab Values: Serum creatinine 1.13, BUN 14, Potassium 3.3, Sodium 137, BNP 3948, Magnesium 1.9, A1c 6.3% (8 months ago)  Vital Signs: Weight: 178 lbs (admission weight: 187 lbs) Blood pressure: 110s-130s/70s  Heart rate: 70s I/O: -2.5L yesterday; net -2.8L  Medication Assistance / Insurance Benefits Check: Does the patient have prescription insurance?  Yes Type of insurance plan: Medicare  Does the patient qualify for medication assistance through manufacturers or grants?   Yes Eligible grants and/or patient assistance programs: None currently Medication assistance applications in progress: None  Medication assistance applications approved: None Approved medication assistance renewals will be completed by: N/A  Outpatient Pharmacy:  Prior to admission outpatient pharmacy: [facility] Is the  patient willing to use [facility] TOC pharmacy at discharge? Yes Is the patient willing to transition their outpatient pharmacy to utilize a [facility] outpatient pharmacy?   No   Assessment: 1. Acute on chronic systolic CHF (LVEF 25-30%), due to ICM. NYHA class III symptoms. - Scr 1.02>>1.13 (BL ~1), on furosemide 80 mg IV BID. Strict I's and O's. Keep K >4 and Mg > 2. Recommend Mg and K supplementation.  - Recommend transitioning from IV furosemide to PO furosemide 40 mg daily given good UOP yesterday and bump in Scr  - Consider PRN furosemide at discharge  - Continue metoprolol succinate 50 mg BID  - Continue Entresto 49/51 mg BID  - Continue PTA Farxiga 10 mg daily  - Continue PTA spironolactone 12.5 mg daily    Plan: 1) Medication changes recommended at this time: - Recommend transitioning from IV furosemide to PO furosemide 40 mg daily   - Consider PRN furosemide at discharge  2) Patient assistance: Sherryll Burger - $0 - Jardiance - $0  3)  Education  - Patient has been educated on current HF medications and potential additions to HF medication regimen - Patient verbalizes understanding that over the next few months, these medication doses may change and more medications may be added to optimize HF regimen - Patient has been educated on basic disease state pathophysiology and goals of therapy   Cherylin Mylar, PharmD PGY1 Pharmacy Resident 5/22/20249:33 AM

## 2023-01-08 NOTE — Plan of Care (Signed)
  Problem: Education: Goal: Understanding of cardiac disease, CV risk reduction, and recovery process will improve 01/08/2023 1651 by Reynold Bowen, RN Outcome: Adequate for Discharge 01/08/2023 1204 by Reynold Bowen, RN Outcome: Progressing Goal: Individualized Educational Video(s) 01/08/2023 1651 by Reynold Bowen, RN Outcome: Adequate for Discharge 01/08/2023 1204 by Reynold Bowen, RN Outcome: Progressing   Problem: Activity: Goal: Ability to tolerate increased activity will improve 01/08/2023 1651 by Reynold Bowen, RN Outcome: Adequate for Discharge 01/08/2023 1204 by Reynold Bowen, RN Outcome: Progressing   Problem: Cardiac: Goal: Ability to achieve and maintain adequate cardiovascular perfusion will improve 01/08/2023 1651 by Reynold Bowen, RN Outcome: Adequate for Discharge 01/08/2023 1204 by Reynold Bowen, RN Outcome: Progressing   Problem: Health Behavior/Discharge Planning: Goal: Ability to safely manage health-related needs after discharge will improve Outcome: Adequate for Discharge   Problem: Education: Goal: Understanding of CV disease, CV risk reduction, and recovery process will improve Outcome: Adequate for Discharge Goal: Individualized Educational Video(s) Outcome: Adequate for Discharge   Problem: Activity: Goal: Ability to return to baseline activity level will improve Outcome: Adequate for Discharge   Problem: Cardiovascular: Goal: Ability to achieve and maintain adequate cardiovascular perfusion will improve Outcome: Adequate for Discharge Goal: Vascular access site(s) Level 0-1 will be maintained Outcome: Adequate for Discharge   Problem: Health Behavior/Discharge Planning: Goal: Ability to safely manage health-related needs after discharge will improve Outcome: Adequate for Discharge   Problem: Education: Goal: Knowledge of General Education information will improve Description:  Including pain rating scale, medication(s)/side effects and non-pharmacologic comfort measures Outcome: Adequate for Discharge   Problem: Health Behavior/Discharge Planning: Goal: Ability to manage health-related needs will improve Outcome: Adequate for Discharge   Problem: Clinical Measurements: Goal: Ability to maintain clinical measurements within normal limits will improve Outcome: Adequate for Discharge Goal: Will remain free from infection Outcome: Adequate for Discharge Goal: Diagnostic test results will improve Outcome: Adequate for Discharge Goal: Respiratory complications will improve Outcome: Adequate for Discharge Goal: Cardiovascular complication will be avoided Outcome: Adequate for Discharge   Problem: Activity: Goal: Risk for activity intolerance will decrease Outcome: Adequate for Discharge   Problem: Nutrition: Goal: Adequate nutrition will be maintained Outcome: Adequate for Discharge   Problem: Coping: Goal: Level of anxiety will decrease Outcome: Adequate for Discharge   Problem: Elimination: Goal: Will not experience complications related to bowel motility Outcome: Adequate for Discharge Goal: Will not experience complications related to urinary retention Outcome: Adequate for Discharge   Problem: Pain Managment: Goal: General experience of comfort will improve Outcome: Adequate for Discharge   Problem: Safety: Goal: Ability to remain free from injury will improve Outcome: Adequate for Discharge   Problem: Skin Integrity: Goal: Risk for impaired skin integrity will decrease Outcome: Adequate for Discharge   Problem: Education: Goal: Ability to demonstrate management of disease process will improve Outcome: Adequate for Discharge Goal: Ability to verbalize understanding of medication therapies will improve Outcome: Adequate for Discharge Goal: Individualized Educational Video(s) Outcome: Adequate for Discharge   Problem: Activity: Goal:  Capacity to carry out activities will improve Outcome: Adequate for Discharge   Problem: Cardiac: Goal: Ability to achieve and maintain adequate cardiopulmonary perfusion will improve Outcome: Adequate for Discharge

## 2023-01-08 NOTE — Plan of Care (Signed)
  Problem: Education: Goal: Understanding of cardiac disease, CV risk reduction, and recovery process will improve Outcome: Progressing Goal: Individualized Educational Video(s) Outcome: Progressing   Problem: Activity: Goal: Ability to tolerate increased activity will improve Outcome: Progressing   Problem: Cardiac: Goal: Ability to achieve and maintain adequate cardiovascular perfusion will improve Outcome: Progressing   

## 2023-01-08 NOTE — Discharge Summary (Addendum)
Discharge Summary    Patient ID: Paula Wong MRN: [id number]; DOB: June 08, 1946  Admit date: 01/06/2023 Discharge date: 01/08/2023  PCP:  Loura Back, NP   [facility] Providers Cardiologist:  Kristeen Miss, MD      Discharge Diagnoses    Principal Problem:   Acute on chronic HFrEF (heart failure with reduced ejection fraction) [facility]) Active Problems:   Hypertension   Hyperlipidemia   Ventricular tachycardia (HCC)   CAD (coronary artery disease)    Diagnostic Studies/Procedures    Left heart catheterization 12/31/2022 Mid Cx lesion is 5% stenosed.   Ost LAD to Prox LAD lesion is 40% stenosed.   Mid LAD-2 lesion is 30% stenosed.   Ost LM lesion is 40% stenosed.   Mid LM to Dist LM lesion is 30% stenosed.   RV Branch lesion is 70% stenosed.   Prox LAD to Mid LAD lesion is 85% stenosed.   Mid LAD-1 lesion is 85% stenosed.   A drug-eluting stent was successfully placed using a SYNERGY XD 3.0X24.   Post intervention, there is a 0% residual stenosis.   Post intervention, there is a 0% residual stenosis.   LV end diastolic pressure is moderately elevated.   2 vessel obstructive CAD with severe LAD stenosis sequentially with heavy calcification. Moderate RCA disease diffusely in a small branch Moderately elevated LVEDP 25 mm Hg Successful PCI of the mid LAD using IVUS guidance, Shockwave intracoronary lithotripsy and DES x 1.   Plan: DAPT for one year. Anticipate possible DC tomorrow.    Echocardiogram 12/06/2022  1. Left ventricular ejection fraction, by estimation, is 25 to 30%. The  left ventricle has severely decreased function. The left ventricle  demonstrates global hypokinesis. The left ventricular internal cavity size  was moderately dilated. Left  ventricular diastolic parameters are indeterminate.   2. Right ventricular systolic function is normal. The right ventricular  size is normal.   3. The mitral valve is normal in structure. Mild to moderate  mitral valve  regurgitation. No evidence of mitral stenosis.   4. The aortic valve is tricuspid. There is mild calcification of the  aortic valve. Aortic valve regurgitation is mild. Aortic valve sclerosis  is present, with no evidence of aortic valve stenosis.   5. The inferior vena cava is normal in size with greater than 50%  respiratory variability, suggesting right atrial pressure of 3 mmHg.  _____________   History of Present Illness     Paula Wong is a 77 y.o. female with a past medical history of CVA, CHF, recent V-Tach. She is followed by Dr. Elease Hashimoto. Per chart review, patient was recently admitted from 5/13 - 01/02/23 after her implanted loop recorder showed an episode of sustained ventricular cardia that lasted for 23 seconds with spontaneous termination with a 3 second pause. Patient was asymptomatic during the episode. She was instructed to go to the ED for further evaluation. She underwent left heart catheterization with successful PCI of the mid LAD using IVUS guidance, shockwave intracoronary lithotripsy and DESx1. He was discharged on 5/16 on amiodarone 400 mg BID for 2 weeks followed by 200 mg BID for 2 weeks, followed by 200 mg daily.   Patient was seen by Dr. Elease Hashimoto on 5/20 in the office, and she complained of worsening shortness of breath. Found to be hypoxic in the clinic with O2 sats ranging from 77-88%. She was volume overloaded on exam, and was admitted to [facility] for IV lasix.   Hospital Course  Consultants: None  Patient was admitted to [facility] on 5/20 after she was seen in the office.  In the office, she was hypoxic and complained of worsening shortness of breath, dyspnea on exertion, orthopnea, poor appetite, ankle edema.  She was admitted to the hospital for IV diuresis.  Upon arrival, her BNP was elevated to 3948 and CXR showed bibasilar atelectasis/edema. hsTn 177, 160. She was started on IV Lasix 80 mg twice daily. Her home  beta-blocker, Sherryll Burger, Farxiga, and spironolactone were resumed.  She remained on IV Lasix 80 mg twice daily until 5/22.  On 5/22, she was net -2.9 L since admission.  She was able to tolerate ambulation in the hallway while maintaining oxygen saturation of 96% on room air.  Prior to discharge, patient denied shortness of breath, orthopnea, dyspnea on exertion, ankle edema.  She was transitioned to Lasix 40 mg daily.  Throughout her admission, patient remained on telemetry.  There was no episodes of V. tach noted.  Acute on Chronic Combined CHF  Ischemic Cardiomyopathy  - Most recent echocardiogram from 11/2022 showed EF 25-30%, normal RV systolic function, mild-moderate mitral valve regurgitation - Patient was seen in clinic 5/20 by Dr. Elease Hashimoto- patient complained of progressive dyspnea, orthopnea, early satiety, hypoxia (sats in the 80s)  - Patient was transported to the ED where BNP was elevated to 3948. Initial CXR showed bibasilar atelectasis or edema with associated pleural effusions.  - She was treated with IV lasix and was euvolemic prior to DC.  - Discharging patient on lasix 40 mg daily.  - Continue metoprolol succinate 50 mg BID, entresto 49/51 mg BID, farxiga 10 mg daily, spironolactone 12.5 mg daily  - Patient's K was 3.3 on 5/22. She was given 60 mEq K supplementation prior to DC. Continue potassium chloride 10 mEq daily at discharge.  - Given low K and history of V-tach, patient needs to get BMP on 5/24. I ordered outpatient BMP and arranged lab appointment for 5/24. Instructed patient to come to the office on 5/24 for labs and she voiced understanding.  - Patient has a follow up appointment on 5/29.   CAD  Demand ischemia  HLD  - Most recent cardiac catheterization from 12/31/22 showed 2 vessel obstructive disease with severe LAD stenosis sequentially with heavy calcification. There was moderate RCA disease diffusely. Patient was treated with shockwave intracoronary lithotripsy and  DES x2 in the mid LAD  - This admission, hsTn 177>160. She denied chest pain on admission and remained chest pain free throughout her admission  - Trop trend is mild and flat/down trending. Suspect this is demand ischemia in the setting of CHF, hypoxia   - Continue ASA, plavix  - Continue lipitor 80 mg daily  - Continue metoprolol succinate 50 mg BID    VT - Patient was recently admitted from 5/13 - 01/02/23 for V-tach - V-tach occurred int he setting of ischemia, and patient is now s/p PCI of the LAD  - No syncope/ near syncope  - No VT on telemetry  - Continue metoprolol and amiodarone taper  - Amiodarone 400 mg BID until 5/28 followed by 200 mg BID for the next 2 weeks followed by 200 mg daily  - Maintain K>4, mag >2. As above, K was supplemented prior to DC. As above, I ordered an outpatient BMP and instructed patient to come to the office on 5/24 to get BMP drawn.   Patient was seen and examined by Dr. Jacques Navy, and was deemed stable for  discharge.   Patient has a follow up appointment on 5/29  Did the patient have an acute coronary syndrome (MI, NSTEMI, STEMI, etc) this admission?:  No                               Did the patient have a percutaneous coronary intervention (stent / angioplasty)?:  No.        _____________  Discharge Vitals Blood pressure 92/65, pulse 72, temperature 98.2 F (36.8 C), temperature source Oral, resp. rate 18, height 5\' 5"  (1.651 m), weight 80.8 kg, SpO2 96 %.  Filed Weights   01/07/23 0312 01/07/23 0612 01/08/23 0453  Weight: 84.9 kg 82.9 kg 80.8 kg    Labs & Radiologic Studies    CBC Recent Labs    01/06/23 1800 01/07/23 0131 01/08/23 0101  WBC 6.3 7.9 7.5  NEUTROABS 4.4  --   --   HGB 12.9 13.5 14.7  HCT 41.8 42.9 45.9  MCV 84.1 84.1 80.8  PLT 306 299 349   Basic Metabolic Panel Recent Labs    16/10/96 1800 01/07/23 0414 01/08/23 0101  NA 141  --  137  K 4.5  --  3.3*  CL 104  --  98  CO2 22  --  27  GLUCOSE 120*  --  88   BUN 12  --  14  CREATININE 1.05* 1.02* 1.13*  CALCIUM 9.3  --  9.4  MG 1.8  --  1.9   Liver Function Tests No results for input(s): "AST", "ALT", "ALKPHOS", "BILITOT", "PROT", "ALBUMIN" in the last 72 hours. No results for input(s): "LIPASE", "AMYLASE" in the last 72 hours. High Sensitivity Troponin:   Recent Labs  Lab 12/30/22 2236 12/31/22 0053 01/06/23 1800 01/06/23 1958  TROPONINIHS 10 12 177* 160*    BNP Invalid input(s): "POCBNP" D-Dimer No results for input(s): "DDIMER" in the last 72 hours. Hemoglobin A1C No results for input(s): "HGBA1C" in the last 72 hours. Fasting Lipid Panel No results for input(s): "CHOL", "HDL", "LDLCALC", "TRIG", "CHOLHDL", "LDLDIRECT" in the last 72 hours. Thyroid Function Tests No results for input(s): "TSH", "T4TOTAL", "T3FREE", "THYROIDAB" in the last 72 hours.  Invalid input(s): "FREET3" _____________  DG Chest 2 View  Result Date: 01/06/2023 CLINICAL DATA:  Shortness of breath. EXAM: CHEST - 2 VIEW COMPARISON:  Dec 30, 2022. FINDINGS: Stable cardiomegaly. Small bilateral pleural effusions are noted with associated bibasilar atelectasis or edema. Bony thorax is unremarkable. IMPRESSION: Bibasilar atelectasis or edema is noted with associated pleural effusions. Electronically Signed   By: Lupita Raider M.D.   On: 01/06/2023 17:39   CARDIAC CATHETERIZATION  Result Date: 12/31/2022   Mid Cx lesion is 5% stenosed.   Ost LAD to Prox LAD lesion is 40% stenosed.   Mid LAD-2 lesion is 30% stenosed.   Ost LM lesion is 40% stenosed.   Mid LM to Dist LM lesion is 30% stenosed.   RV Branch lesion is 70% stenosed.   Prox LAD to Mid LAD lesion is 85% stenosed.   Mid LAD-1 lesion is 85% stenosed.   A drug-eluting stent was successfully placed using a SYNERGY XD 3.0X24.   Post intervention, there is a 0% residual stenosis.   Post intervention, there is a 0% residual stenosis.   LV end diastolic pressure is moderately elevated. 2 vessel obstructive CAD  with severe LAD stenosis sequentially with heavy calcification. Moderate RCA disease diffusely in a small branch Moderately  elevated LVEDP 25 mm Hg Successful PCI of the mid LAD using IVUS guidance, Shockwave intracoronary lithotripsy and DES x 1. Plan: DAPT for one year. Anticipate possible DC tomorrow.   DG Chest 2 View  Result Date: 12/30/2022 CLINICAL DATA:  Palpitations EXAM: CHEST - 2 VIEW COMPARISON:  CT done on 04/15/2022 FINDINGS: Transverse diameter of heart is increased. Central pulmonary vessels are prominent. There is an implantable cardiac monitoring device in left anterior chest wall. There is slight prominence of interstitial markings in the parahilar regions and lower lung fields. Small bilateral pleural effusions are seen. There is no pneumothorax. IMPRESSION: Cardiomegaly. Central pulmonary vessels are prominent suggesting CHF. Small bilateral pleural effusions. Electronically Signed   By: Ernie Avena M.D.   On: 12/30/2022 16:10   CUP PACEART REMOTE DEVICE CHECK  Result Date: 12/26/2022 ILR summary report received. Battery status OK. Normal device function. No new symptom, tachy, brady, or pause episodes. No new AF episodes. Monthly summary reports and ROV/PRN LA, CVRS  Disposition   Pt is being discharged home today in good condition.  Follow-up Plans & Appointments     Follow-up Information     [facility]. Go in 15 day(s).   Specialty: Cardiology Why: Hospital follow up 01/24/2023 @ 9 am PLEASE bring a current mediction list to appointment FREE valet parking, Entrance C, off [employer] information: [address] [id number] mc [place] [address] [phone]        Levi Aland, NP Follow up on 01/15/2023.   Specialty: Cardiology Why: Appointment at 1:55 PM Contact information: [address] [place] [address] [phone]         [facility]  at [facility] Follow up on 01/10/2023.   Specialty: Cardiology Why: Please come to the cardiology office during normal hours on 5/24 for labs. Contact information: [address] [id number] mc [place] [address] [phone]               Discharge Instructions     (HEART FAILURE PATIENTS) Call MD:  Anytime you have any of the following symptoms: 1) 3 pound weight gain in 24 hours or 5 pounds in 1 week 2) shortness of breath, with or without a dry hacking cough 3) swelling in the hands, feet or stomach 4) if you have to sleep on extra pillows at night in order to breathe.   Complete by: As directed    Call MD for:  difficulty breathing, headache or visual disturbances   Complete by: As directed    Call MD for:  extreme fatigue   Complete by: As directed    Diet - low sodium heart healthy   Complete by: As directed    Increase activity slowly   Complete by: As directed         Discharge Medications   Allergies as of 01/08/2023   No Known Allergies      Medication List     TAKE these medications    amiodarone 200 MG tablet Commonly known as: PACERONE Take 2 tablets (400 mg total) by mouth 2 (two) times daily for 14 days, THEN 1 tablet (200 mg total) 2 (two) times daily for 14 days, THEN 1 tablet (200 mg total) daily thereafter Start taking on: Jan 02, 2023   Aspirin Low Dose 81 MG chewable tablet Generic drug: aspirin Chew 1 tablet (81 mg total) by mouth daily.   atorvastatin 80  MG tablet Commonly known as: LIPITOR Take 80 mg by mouth at bedtime.   clopidogrel 75 MG tablet Commonly known as: PLAVIX Take 1 tablet (75 mg total) by mouth daily.   Entresto 49-51 MG Generic drug: sacubitril-valsartan Take 1 tablet by mouth 2 (two) times daily.   Farxiga 10 MG Tabs tablet Generic drug: dapagliflozin propanediol Take 10 mg by mouth daily.   furosemide 40 MG tablet Commonly known as: LASIX Take 1 tablet (40 mg total) by  mouth daily. Start taking on: Jan 09, 2023   gabapentin 600 MG tablet Commonly known as: NEURONTIN Take 600 mg by mouth 2 (two) times daily.   magnesium oxide 400 MG tablet Commonly known as: MAG-OX Take 400 mg by mouth daily.   metFORMIN 500 MG 24 hr tablet Commonly known as: GLUCOPHAGE-XR Take 500 mg by mouth daily with breakfast.   metoprolol succinate 50 MG 24 hr tablet Commonly known as: TOPROL-XL Take 50 mg by mouth 2 (two) times daily.   multivitamin tablet Take 1 tablet by mouth daily.   nitroGLYCERIN 0.4 MG SL tablet Commonly known as: NITROSTAT Place 1 tablet (0.4 mg total) under the tongue every 5 (five) minutes x 3 doses as needed for chest pain.   Ozempic (1 MG/DOSE) 4 MG/3ML Sopn Generic drug: Semaglutide (1 MG/DOSE) Inject 1 mg into the skin once a week.   pantoprazole 20 MG tablet Commonly known as: PROTONIX Take 1 tablet (20 mg total) by mouth daily.   potassium chloride 10 MEQ tablet Commonly known as: KLOR-CON M Take 1 tablet (10 mEq total) by mouth daily.   spironolactone 25 MG tablet Commonly known as: ALDACTONE Take 12.5 mg by mouth daily.   Evaristo Bury FlexTouch 200 UNIT/ML FlexTouch Pen Generic drug: insulin degludec Take 80 Units by mouth at bedtime.           Outstanding Labs/Studies    BMP at follow up   Duration of Discharge Encounter   Greater than 30 minutes including physician time.  Signed, Jonita Albee, PA-C 01/08/2023, 4:16 PM

## 2023-01-08 NOTE — Progress Notes (Signed)
Discharge instructions provided to patient. Patient verbalized understanding of all instructions and follow-up care.  Patient discharged to home with all belongings via wheelchair by NT.

## 2023-01-08 NOTE — TOC Transition Note (Signed)
Transition of Care [facility]) - CM/SW Discharge Note   Patient Details  Name: Paula Wong MRN: [id number] Date of Birth: November 22, 1945  [facility] of [facility]) CM/SW Contact:  Leone Haven, RN Phone Number: 01/08/2023, 4:35 PM   Clinical Narrative:    Patient is for dc today, she does not need any oxygen.  She has transportation home. No needs.         Patient Goals and CMS Choice      Discharge Placement                         Discharge Plan and Services Additional resources added to the After Visit Summary for                                       Social Determinants of Health (SDOH) Interventions SDOH Screenings   Food Insecurity: No Food Insecurity (01/07/2023)  Housing: Low Risk  (01/07/2023)  Transportation Needs: No Transportation Needs (01/07/2023)  Utilities: Not At Risk (01/07/2023)  Alcohol Screen: Low Risk  (01/07/2023)  Financial Resource Strain: Low Risk  (01/07/2023)  Tobacco Use: Medium Risk (01/07/2023)     Readmission Risk Interventions    01/07/2023   12:41 PM  Readmission Risk Prevention Plan  Post Dischage Appt Complete  Medication Screening Complete  Transportation Screening Complete

## 2023-01-09 ENCOUNTER — Ambulatory Visit: Admit: 2023-01-09 | Payer: 59 | Admitting: General Surgery

## 2023-01-09 SURGERY — EXCISION, KELOID
Anesthesia: Monitor Anesthesia Care | Laterality: Left

## 2023-01-10 ENCOUNTER — Ambulatory Visit: Payer: 59 | Attending: Cardiovascular Disease

## 2023-01-10 DIAGNOSIS — I472 Ventricular tachycardia, unspecified: Secondary | ICD-10-CM

## 2023-01-11 ENCOUNTER — Telehealth: Payer: Self-pay | Admitting: Cardiology

## 2023-01-11 LAB — BASIC METABOLIC PANEL
BUN/Creatinine Ratio: 12 (ref 12–28)
BUN: 16 mg/dL (ref 8–27)
CO2: 26 mmol/L (ref 20–29)
Calcium: 9.3 mg/dL (ref 8.7–10.3)
Chloride: 104 mmol/L (ref 96–106)
Creatinine, Ser: 1.39 mg/dL — ABNORMAL HIGH (ref 0.57–1.00)
Glucose: 71 mg/dL (ref 70–99)
Potassium: 4.8 mmol/L (ref 3.5–5.2)
Sodium: 146 mmol/L — ABNORMAL HIGH (ref 134–144)
eGFR: 39 mL/min/{1.73_m2} — ABNORMAL LOW (ref >59)

## 2023-01-11 NOTE — Telephone Encounter (Addendum)
contacted patient regarding recent lab results.  BMP from Friday 5/24 showed that her potassium was within normal limits at 4.8.  However, creatinine had bumped from 1.13> 1.39 after recently being discharged on p.o. Lasix 40 mg daily.  I discussed these results with patient and told her I was concerned she was getting a bit dehydrated on the Lasix.  Patient reported that for the past couple of days, she has not been drinking nearly as much water as she usually does.  She thought that she was not supposed to be drinking water to prevent fluid build up. I instructed patient that she should increase her water intake to prevent dehydration, recommend that she gets a repeat BMP when seen in the office later this week.  Jonita Albee, PA-C 01/11/2023 12:04 PM

## 2023-01-14 NOTE — Progress Notes (Signed)
Carelink Summary Report / Loop Recorder 

## 2023-01-15 ENCOUNTER — Ambulatory Visit: Payer: 59 | Admitting: Nurse Practitioner

## 2023-01-15 NOTE — Progress Notes (Signed)
Cardiology Office Note:    Date:  01/17/2023   ID:  Paula Wong, DOB 10-May-1946, MRN [id number]  PCP:  Loura Back, NP   [facility] Providers Cardiologist:  Kristeen Miss, MD     Referring MD: Loura Back, NP   Chief Complaint: hospital follow-up CHF  History of Present Illness:    Paula Wong is a very pleasant 77 y.o. female with a hx of CVA, implanted loop recorder, CAD, VT on amiodarone, and chronic combined CHF.   Establish care with cardiology during admission for stroke.  Coronary calcium score 2553 with multivessel disease.  Cardiac cath 04/16/2022 moderate to severe CAD.  Her LV dysfunction seems out of proportion to her degree of CAD.  Plan to proceed with shockwave therapy or CSI atherectomy if she developed symptoms of chest pain.  Admission 12/30/2022 with sustained VT. She had no apparent symptoms but VT was picked up on her implantable loop recorder. HR as high as 180 bpm, polymorphic, lasted 23 seconds and spontaneously terminated. Echo December 06, 2022 revealed severe LV dysfunction with EF 25 to 30%.   Repeat cardiac catheterization 12/31/2022 revealed two-vessel obstructive CAD with severe LAD stenosis sequentially with heavy calcification, moderate RCA disease diffusely in a small branch, successful PCI of the mid LAD using IVUS guidance, shockwave intracoronary lithotripsy and DES x 1. Moderately elevated LVEDP 25 mmHg.  She was discharged 01/02/2023 on amiodarone 400 mg twice daily for 2 weeks followed by 200 mg twice daily for 2 weeks followed by 200 mg daily.  Seen in cardiology clinic 01/06/23 by Dr. Elease Hashimoto at which time she reported increased shortness of breath.  O2 sat's were ranging from 77-88 while sitting in the wheelchair.  She reported shortness of breath, early satiety started 2 to 3 days after hospital discharge. No chest pain. Few basilar rales noted on exam. She was transferred to ED by EMS from the office.   Upon admission, BNP noted to be  3948.7, hs Top 177 >>160. CXR with edema. Crackles, JVD, increased abdominal girth, and trace LE edema on exam.  Lasix 80 mg IV twice daily was added. Net loss -2.9 L during admission. She was transitioned to Lasix 40 mg daily. No episodes of V. tach on telemetry during admission.  Potassium was low during admission and was supplemented orally.  Repeat BMP was ordered for 01/10/2023.  BMP 01/10/23 revealed Scr 1.39, K+ 4.8, Na 146. Contacted by Robet Leu, PA and she reported she had not been drinking water due to concern for fluid overload.  She was advised to increase hydration and return for follow-up as scheduled.  Today, she is here for post hospital follow-up.  Reports she is feeling much better since hospital discharge. She reports no problems with medications. Home weight is stable and Home BP similar to what we got today. Is working on [employer]. Admits she ate a high sodium and high sugar (multiple bags of candy at home) diet prior to admission. Is really working on eating healthy. She denies chest pain, shortness of breath, lower extremity edema, fatigue, palpitations, melena, hematuria, hemoptysis, diaphoresis, weakness, presyncope, syncope, orthopnea, and PND. She does not drive, uses the bus for transportation, and does her own cooking. Her last living sibling died last year. She wants to live many more years. Remains active around her home and with shopping. Could not split her spironolactone tablets so she was taking 25 mg every other day.   Past Medical History:  Diagnosis Date   Allergy  CAD (coronary artery disease)    a. 03/2022 Cath: LM 40ost, 105m, LAD 40ost/p, 39m, 5m, LCX 13m, RCA nl, RV branch 70-->Med rx.   Chronic HFrEF (heart failure with reduced ejection fraction) (HCC)    a. 11/2021 Echo: EF 20-25%; b. 03/2022 Echo: EF 20-25%; c. 11/2022 Echo: EF 25-30%, glob HK.   Diabetes (HCC)    Diverticulosis    GERD (gastroesophageal reflux disease)    Hiatal hernia     Hyperlipidemia    Hypertension    Implantable loop recorder present    a. 03/2022 s/p Abbott IQ EL+ (serial [id number]) ILR in setting of cryptogenic stroke.   Ischemic cardiomyopathy    a. 11/2021 Echo: EF 20-25%; b. 03/2022 Echo: EF 20-25%; c. 11/2022 Echo: EF 25-30%, glob HK, nl RV fxn, mild-mod MR, mild AI, AoV sclerosis.   Neuropathy    feet   Schatzki's ring    Stroke [facility])    a. 03/2022 MRI Brain: subacute cortical infarct involving inf and med L occipial pole w/ petechial hemorrhage; b. 03/2022 s/p Abbott Assert IQ EL ILR.   Ventricular tachycardia (HCC)    a. 12/2022 noted on ILR.    Past Surgical History:  Procedure Laterality Date   CORONARY LITHOTRIPSY N/A 12/31/2022   Procedure: CORONARY LITHOTRIPSY;  Surgeon: Swaziland, Peter M, MD;  Location: [facility] INVASIVE CV LAB;  Service: Cardiovascular;  Laterality: N/A;   CORONARY STENT INTERVENTION N/A 12/31/2022   Procedure: CORONARY STENT INTERVENTION;  Surgeon: Swaziland, Peter M, MD;  Location: [facility] INVASIVE CV LAB;  Service: Cardiovascular;  Laterality: N/A;   CORONARY ULTRASOUND/IVUS N/A 12/31/2022   Procedure: Coronary Ultrasound/IVUS;  Surgeon: Swaziland, Peter M, MD;  Location: [facility] INVASIVE CV LAB;  Service: Cardiovascular;  Laterality: N/A;   LEFT HEART CATH AND CORONARY ANGIOGRAPHY N/A 04/16/2022   Procedure: LEFT HEART CATH AND CORONARY ANGIOGRAPHY;  Surgeon: Lennette Bihari, MD;  Location: [facility] INVASIVE CV LAB;  Service: Cardiovascular;  Laterality: N/A;   LEFT HEART CATH AND CORONARY ANGIOGRAPHY N/A 12/31/2022   Procedure: LEFT HEART CATH AND CORONARY ANGIOGRAPHY;  Surgeon: Swaziland, Peter M, MD;  Location: [facility] INVASIVE CV LAB;  Service: Cardiovascular;  Laterality: N/A;   LOOP RECORDER INSERTION N/A 04/15/2022   Procedure: LOOP RECORDER INSERTION;  Surgeon: Lanier Prude, MD;  Location: [facility] INVASIVE CV LAB;  Service: Cardiovascular;  Laterality: N/A;   MASS EXCISION Right 06/07/2019   Procedure: Dawson Bills OF KELOID TO RIGHT NECK;  Surgeon:  Louisa Second, MD;  Location: [facility];  Service: Plastics;  Laterality: Right;   TOOTH EXTRACTION  2013    Current Medications: Current Meds  Medication Sig   amiodarone (PACERONE) 200 MG tablet Take 2 tablets (400 mg total) by mouth 2 (two) times daily for 14 days, THEN 1 tablet (200 mg total) 2 (two) times daily for 14 days, THEN 1 tablet (200 mg total) daily thereafter (Patient taking differently: No sig reported)   aspirin 81 MG chewable tablet Chew 1 tablet (81 mg total) by mouth daily.   atorvastatin (LIPITOR) 80 MG tablet Take 80 mg by mouth at bedtime.   clopidogrel (PLAVIX) 75 MG tablet Take 1 tablet (75 mg total) by mouth daily.   FARXIGA 10 MG TABS tablet Take 10 mg by mouth daily.   furosemide (LASIX) 40 MG tablet Take 1 tablet (40 mg total) by mouth daily.   gabapentin (NEURONTIN) 600 MG tablet Take 600 mg by mouth 2 (two) times daily.   magnesium oxide (MAG-OX) 400 MG tablet  Take 400 mg by mouth daily.   metFORMIN (GLUCOPHAGE-XR) 500 MG 24 hr tablet Take 500 mg by mouth daily with breakfast.   metoprolol succinate (TOPROL-XL) 50 MG 24 hr tablet Take 50 mg by mouth 2 (two) times daily.   Multiple Vitamin (MULTIVITAMIN) tablet Take 1 tablet by mouth daily.   nitroGLYCERIN (NITROSTAT) 0.4 MG SL tablet Place 1 tablet (0.4 mg total) under the tongue every 5 (five) minutes x 3 doses as needed for chest pain.   OZEMPIC, 1 MG/DOSE, 4 MG/3ML SOPN Inject 1 mg into the skin once a week.   pantoprazole (PROTONIX) 20 MG tablet Take 1 tablet (20 mg total) by mouth daily.   potassium chloride (KLOR-CON M) 10 MEQ tablet Take 1 tablet (10 mEq total) by mouth daily.   sacubitril-valsartan (ENTRESTO) 49-51 MG Take 1 tablet by mouth 2 (two) times daily.   spironolactone (ALDACTONE) 25 MG tablet Take 25 mg by mouth every other day.   TRESIBA FLEXTOUCH 200 UNIT/ML SOPN Take 80 Units by mouth at bedtime.      Allergies:   Patient has no known allergies.   Social History    Socioeconomic History   Marital status: Single    Spouse name: Not on file   Number of children: 2   Years of education: Not on file   Highest education level: High school graduate  Occupational History   Occupation: Retired  Tobacco Use   Smoking status: Former    Types: Cigarettes    Quit date: 08/20/2003    Years since quitting: 19.4   Smokeless tobacco: Never  Vaping Use   Vaping Use: Never used  Substance and Sexual Activity   Alcohol use: No   Drug use: No   Sexual activity: Not on file  Other Topics Concern   Not on file  Social History Narrative   Not on file   Social Determinants of Health   Financial Resource Strain: Low Risk  (01/07/2023)   Overall Financial Resource Strain (CARDIA)    Difficulty of Paying Living Expenses: Not hard at all  Food Insecurity: No Food Insecurity (01/07/2023)   Hunger Vital Sign    Worried About Running Out of Food in the Last Year: Never true    Ran Out of Food in the Last Year: Never true  Transportation Needs: No Transportation Needs (01/07/2023)   PRAPARE - Administrator, Civil Service (Medical): No    Lack of Transportation (Non-Medical): No  Physical Activity: Not on file  Stress: Not on file  Social Connections: Not on file     Family History: The patient's family history includes Cancer in her sister; Colon cancer (age of onset: 67) in her mother; Heart attack in her brother and father. There is no history of Stomach cancer, Pancreatic cancer, or Esophageal cancer.  ROS:   Please see the history of present illness.   All other systems reviewed and are negative.  Labs/Other Studies Reviewed:    The following studies were reviewed today:  LHC 12/31/22    Mid Cx lesion is 5% stenosed.   Ost LAD to Prox LAD lesion is 40% stenosed.   Mid LAD-2 lesion is 30% stenosed.   Ost LM lesion is 40% stenosed.   Mid LM to Dist LM lesion is 30% stenosed.   RV Branch lesion is 70% stenosed.   Prox LAD to Mid LAD  lesion is 85% stenosed.   Mid LAD-1 lesion is 85% stenosed.   A drug-eluting stent  was successfully placed using a SYNERGY XD 3.0X24.   Post intervention, there is a 0% residual stenosis.   Post intervention, there is a 0% residual stenosis.   LV end diastolic pressure is moderately elevated.   2 vessel obstructive CAD with severe LAD stenosis sequentially with heavy calcification. Moderate RCA disease diffusely in a small branch Moderately elevated LVEDP 25 mm Hg Successful PCI of the mid LAD using IVUS guidance, Shockwave intracoronary lithotripsy and DES x 1.   Plan: DAPT for one year. Anticipate possible DC tomorrow.   Echo 12/06/22  1. Left ventricular ejection fraction, by estimation, is 25 to 30%. The  left ventricle has severely decreased function. The left ventricle  demonstrates global hypokinesis. The left ventricular internal cavity size  was moderately dilated. Left  ventricular diastolic parameters are indeterminate.   2. Right ventricular systolic function is normal. The right ventricular  size is normal.   3. The mitral valve is normal in structure. Mild to moderate mitral valve  regurgitation. No evidence of mitral stenosis.   4. The aortic valve is tricuspid. There is mild calcification of the  aortic valve. Aortic valve regurgitation is mild. Aortic valve sclerosis  is present, with no evidence of aortic valve stenosis.   5. The inferior vena cava is normal in size with greater than 50%  respiratory variability, suggesting right atrial pressure of 3 mmHg.  LHC 04/16/22   Ost LM lesion is 40% stenosed.   Mid LM to Dist LM lesion is 30% stenosed.   Ost LAD to Prox LAD lesion is 40% stenosed.   Mid LAD-1 lesion is 80% stenosed.   Mid LAD-2 lesion is 30% stenosed.   RV Branch lesion is 70% stenosed.   Mid Cx lesion is 5% stenosed.   Significant multi vessel coronary calcification.   The left main has 40% ostial 30% distal stenosis; the LAD has 40% ostial to  proximal stenosis followed by focal calcified 80% stenosis prior to the takeoff of a septal perforator and the second diagonal vessel with 30% mid stenosis; mild nonobstructive disease in the left circumflex vessel, and diffuse 70% stenosis in the marginal branch of the RCA.   LVEDP is elevated at 22 mmHg.   RECOMMENDATION: Patient has significant LV dysfunction out of proportion to her or significant LAD stenosis.  She has been without anginal symptomatology.  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  Consider addition of anti-ischemic medical therapy with 80% LAD stenosis.  Will ask colleagues to review.  If patient has increasing symptomatology consider PCI to LAD with possible shockwave or CSI atherectomy if needed.  Recent Labs: 01/01/2023: ALT 12; TSH 3.959 01/06/2023: B Natriuretic Peptide 3,948.7 01/08/2023: Hemoglobin 14.7; Magnesium 1.9; Platelets 349 01/10/2023: BUN 16; Creatinine, Ser 1.39; Potassium 4.8; Sodium 146  Recent Lipid Panel    Component Value Date/Time   CHOL 100 12/31/2022 0053   TRIG 101 12/31/2022 0053   HDL 39 (L) 12/31/2022 0053   CHOLHDL 2.6 12/31/2022 0053   VLDL 20 12/31/2022 0053   LDLCALC 41 12/31/2022 0053   LDLCALC 90 10/02/2020 1100     Risk Assessment/Calculations:           Physical Exam:    VS:  BP 120/80   Pulse 70   Ht 5\' 5"  (1.651 m)   Wt 180 lb (81.6 kg)   SpO2 95%   BMI 29.95 kg/m     Wt Readings from Last  3 Encounters:  01/17/23 180 lb (81.6 kg)  01/08/23 178 lb 2.1 oz (80.8 kg)  01/06/23 181 lb 11.2 oz (82.4 kg)     GEN:  Well nourished, well developed in no acute distress HEENT: Normal NECK: No JVD; No carotid bruits CARDIAC: RRR, no murmurs, rubs, gallops RESPIRATORY:  Clear to auscultation without rales, wheezing or rhonchi  ABDOMEN: Soft, non-tender, non-distended MUSCULOSKELETAL:  No edema; No deformity. 2+ pedal pulses, equal bilaterally SKIN: Warm and dry. Left  radial cath site well healed NEUROLOGIC:  Alert and oriented x 3 PSYCHIATRIC:  Normal affect   EKG:  EKG is ordered today.  The ekg ordered today demonstrates normal sinus rhythm at 70 bpm, nonspecific intraventricular block, diffuse T wave abnormality, Q waves? Lateral leads, consider lateral ischemia, no acute change from previous tracing       Diagnoses:    1. Chronic combined systolic and diastolic heart failure (HCC)   2. Coronary artery disease involving native coronary artery of native heart without angina pectoris   3. Hyperlipidemia LDL goal <50   4. Ventricular tachycardia (HCC)   5. Ischemic stroke (HCC)   6. Secondary hypertension    Assessment and Plan:     Chronic combined CHF: NYHA Class I. Able to complete all activities without shortness of breath. No orthopnea, PND, or edema. Home weight is stable. Is limiting fluids to 2L daily. Increased her water intake after hearing from PA that kidney function was worse than when she was admitted. Limiting dietary sodium. Advised her to notify us if weight increases > 2 lbs in 24 hours or > 5 lbs in  week. BP is well controlled. We will recheck renal function and electrolytes today. Continue GDMT including spironolactone, Entresto, metoprolol, Farxiga, furosemide. Was taking spironolactone 25 qod. Agrees to go to pharmacy for help splitting spironolactone tablets to achieve 12.5 mg daily dosing. Has post-hospital follow-up with heart failure clinic next week. Ensured that she has a home scale and home BP cuff.   CAD without angina: Significantly elevated coronary calcium score 2553 03/2022 with initial LHC 04/16/22 that revealed 80% calcified stenosis, plan for medical therapy. Admission for NSVT found on loop recorder, she underwent repeat LHC 12/31/22 which revealed two-vessel obstructive CAD with severe LAD stenosis sequentially with heavy calcification, moderate RCA disease diffusely in the small branch. Successful PCI of mid LAD using  IVUS guidance and shockwave therapy with DES x 1. Plan DAPT x 1 year. She denies chest pain, dyspnea, or other symptoms concerning for angina.  No indication for further ischemic evaluation at this time. Left radial cath site well healed. No bleeding concerns. Continue GDMT including asa, atorvastatin, clopidogrel, metoprolol, Farxiga.   NSVT: One 23 second episode of NSVT picked up on loop recorder, she was asymptomatic. She subsequently underwent stenting of LAD during admission 12/2022. She denies palpitations, tachycardia, presyncope, syncope. Continue remote device checks. Started amiodarone 200 mg twice daily on 5/30 as directed.   Hyperlipidemia: LDL 41 on 01/10/23, well-controlled. Continue atorvastatin.  Hypertension: BP is well controlled.   History of stroke: Admission for CVA, left PCA infarct felt to be of embolic cryptogenic etiology. No residual defects. Cholesterol is well-controlled. She is on Plavix and asa for recent coronary stent.     Cardiac Rehabilitation Eligibility Assessment  The patient is ready to start cardiac rehabilitation pending clearance from the cardiac surgeon. (Ready if okay with [facility])      Disposition: Keep your appointment with [facility] on  6/7 and your July appointment with Dr. Elease Hashimoto  Medication Adjustments/Labs and Tests Ordered: Current medicines are reviewed at length with the patient today.  Concerns regarding medicines are outlined above.  Orders Placed This Encounter  Procedures   Basic Metabolic Panel (BMET)   EKG 12-Lead   No orders of the defined types were placed in this encounter.   Patient Instructions  Medication Instructions:   Your physician recommends that you continue on your current medications as directed. Please refer to the Current Medication list given to you today.   *If you need a refill on your cardiac medications before your next appointment, please call your pharmacy*   Lab  Work:  TODAY!!! BMET  If you have labs (blood work) drawn today and your tests are completely normal, you will receive your results only by: MyChart Message (if you have MyChart) OR A paper copy in the mail If you have any lab test that is abnormal or we need to change your treatment, we will call you to review the results.   Testing/Procedures:  None ordered.   Follow-Up: At [facility], you and your health needs are our priority.  As part of our continuing mission to provide you with exceptional heart care, we have created designated Provider Care Teams.  These Care Teams include your primary Cardiologist (physician) and Advanced Practice Providers (APPs -  Physician Assistants and Nurse Practitioners) who all work together to provide you with the care you need, when you need it.  We recommend signing up for the patient portal called "MyChart".  Sign up information is provided on this After Visit Summary.  MyChart is used to connect with patients for Virtual Visits (Telemedicine).  Patients are able to view lab/test results, encounter notes, upcoming appointments, etc.  Non-urgent messages can be sent to your provider as well.   To learn more about what you can do with MyChart, go to [email or web address].    Your next appointment:   2 month(s)  Provider:   Kristeen Miss, MD     Other Instructions  Mediterranean Diet A Mediterranean diet refers to food and lifestyle choices that are based on the traditions of countries located on the [place] Sea. It focuses on eating more fruits, vegetables, whole grains, beans, nuts, seeds, and heart-healthy fats, and eating less dairy, meat, eggs, and processed foods with added sugar, salt, and fat. This way of eating has been shown to help prevent certain conditions and improve outcomes for people who have chronic diseases, like kidney disease and heart disease. What are tips for following this plan? Reading food labels Check  the serving size of packaged foods. For foods such as rice and pasta, the serving size refers to the amount of cooked product, not dry. Check the total fat in packaged foods. Avoid foods that have saturated fat or trans fats. Check the ingredient list for added sugars, such as corn syrup. Shopping  Buy a variety of foods that offer a balanced diet, including: Fresh fruits and vegetables (produce). Grains, beans, nuts, and seeds. Some of these may be available in unpackaged forms or large amounts (in bulk). Fresh seafood. Poultry and eggs. Low-fat dairy products. Buy whole ingredients instead of prepackaged foods. Buy fresh fruits and vegetables in-season from local farmers markets. Buy plain frozen fruits and vegetables. If you do not have access to quality fresh seafood, buy precooked frozen shrimp or canned fish, such as tuna, salmon, or sardines. Stock your pantry so you always have certain foods  on hand, such as olive oil, canned tuna, canned tomatoes, rice, pasta, and beans. Cooking Cook foods with extra-virgin olive oil instead of using butter or other vegetable oils. Have meat as a side dish, and have vegetables or grains as your main dish. This means having meat in small portions or adding small amounts of meat to foods like pasta or stew. Use beans or vegetables instead of meat in common dishes like chili or lasagna. Experiment with different cooking methods. Try roasting, broiling, steaming, and sauting vegetables. Add frozen vegetables to soups, stews, pasta, or rice. Add nuts or seeds for added healthy fats and plant protein at each meal. You can add these to yogurt, salads, or vegetable dishes. Marinate fish or vegetables using olive oil, lemon juice, garlic, and fresh herbs. Meal planning Plan to eat one vegetarian meal one day each week. Try to work up to two vegetarian meals, if possible. Eat seafood two or more times a week. Have healthy snacks readily available, such  as: Vegetable sticks with hummus. Greek yogurt. Fruit and nut trail mix. Eat balanced meals throughout the week. This includes: Fruit: 2-3 servings a day. Vegetables: 4-5 servings a day. Low-fat dairy: 2 servings a day. Fish, poultry, or lean meat: 1 serving a day. Beans and legumes: 2 or more servings a week. Nuts and seeds: 1-2 servings a day. Whole grains: 6-8 servings a day. Extra-virgin olive oil: 3-4 servings a day. Limit red meat and sweets to only a few servings a month. Lifestyle  Cook and eat meals together with your family, when possible. Drink enough fluid to keep your urine pale yellow. Be physically active every day. This includes: Aerobic exercise like running or swimming. Leisure activities like gardening, walking, or housework. Get 7-8 hours of sleep each night. If recommended by your health care provider, drink red wine in moderation. This means 1 glass a day for nonpregnant women and 2 glasses a day for men. A glass of wine equals 5 oz (150 mL). What foods should I eat? Fruits Apples. Apricots. Avocado. Berries. Bananas. Cherries. Dates. Figs. Grapes. Lemons. Melon. Oranges. Peaches. Plums. Pomegranate. Vegetables Artichokes. Beets. Broccoli. Cabbage. Carrots. Eggplant. Green beans. Chard. Kale. Spinach. Onions. Leeks. Peas. Squash. Tomatoes. Peppers. Radishes. Grains Whole-grain pasta. Brown rice. Bulgur wheat. Polenta. Couscous. Whole-wheat bread. Orpah Cobb. Meats and other proteins Beans. Almonds. Sunflower seeds. Pine nuts. Peanuts. Cod. Salmon. Scallops. Shrimp. Tuna. Tilapia. Clams. Oysters. Eggs. Poultry without skin. Dairy Low-fat milk. Cheese. Greek yogurt. Fats and oils Extra-virgin olive oil. Avocado oil. Grapeseed oil. Beverages Water. Red wine. Herbal tea. Sweets and desserts Greek yogurt with honey. Baked apples. Poached pears. Trail mix. Seasonings and condiments Basil. Cilantro. Coriander. Cumin. Mint. Parsley. Sage. Rosemary.  Tarragon. Garlic. Oregano. Thyme. Pepper. Balsamic vinegar. Tahini. Hummus. Tomato sauce. Olives. Mushrooms. The items listed above may not be a complete list of foods and beverages you can eat. Contact a dietitian for more information. What foods should I limit? This is a list of foods that should be eaten rarely or only on special occasions. Fruits Fruit canned in syrup. Vegetables Deep-fried potatoes (french fries). Grains Prepackaged pasta or rice dishes. Prepackaged cereal with added sugar. Prepackaged snacks with added sugar. Meats and other proteins Beef. Pork. Lamb. Poultry with skin. Hot dogs. Tomasa Blase. Dairy Ice cream. Sour cream. Whole milk. Fats and oils Butter. Canola oil. Vegetable oil. Beef fat (tallow). Lard. Beverages Juice. Sugar-sweetened soft drinks. Beer. Liquor and spirits. Sweets and desserts Cookies. Cakes. Pies. Candy. Seasonings and condiments  Mayonnaise. Pre-made sauces and marinades. The items listed above may not be a complete list of foods and beverages you should limit. Contact a dietitian for more information. Summary The Mediterranean diet includes both food and lifestyle choices. Eat a variety of fresh fruits and vegetables, beans, nuts, seeds, and whole grains. Limit the amount of red meat and sweets that you eat. If recommended by your health care provider, drink red wine in moderation. This means 1 glass a day for nonpregnant women and 2 glasses a day for men. A glass of wine equals 5 oz (150 mL). This information is not intended to replace advice given to you by your health care provider. Make sure you discuss any questions you have with your health care provider. Document Revised: 09/10/2019 Document Reviewed: 07/08/2019 Elsevier Patient Education  2023 Elsevier Inc. Adopting a Healthy Lifestyle.   Weight: Know what a healthy weight is for you (roughly BMI <25) and aim to maintain this. You can calculate your body mass index on your smart  phone  Diet: Aim for 7+ servings of fruits and vegetables daily Limit animal fats in diet for cholesterol and heart health - choose grass fed whenever available Avoid highly processed foods (fast food burgers, tacos, fried chicken, pizza, hot dogs, french fries)  Saturated fat comes in the form of butter, lard, coconut oil, margarine, partially hydrogenated oils, and fat in meat. These increase your risk of cardiovascular disease.  Use healthy plant oils, such as olive, canola, soy, corn, sunflower and peanut.  Whole foods such as fruits, vegetables and whole grains have fiber  Men need > 38 grams of fiber per day Women need > 25 grams of fiber per day  Load up on vegetables and fruits - one-half of your plate: Aim for color and variety, and remember that potatoes dont count. Go for whole grains - one-quarter of your plate: Whole wheat, barley, wheat berries, quinoa, oats, brown rice, and foods made with them. If you want pasta, go with whole wheat pasta. Protein power - one-quarter of your plate: Fish, chicken, beans, and nuts are all healthy, versatile protein sources. Limit red meat. You need carbohydrates for energy! The type of carbohydrate is more important than the amount. Choose carbohydrates such as vegetables, fruits, whole grains, beans, and nuts in the place of white rice, white pasta, potatoes (baked or fried), macaroni and cheese, cakes, cookies, and donuts.  If youre thirsty, drink water. Coffee and tea are good in moderation, but skip sugary drinks and limit milk and dairy products to one or two daily servings. Keep sugar intake at 6 teaspoons or 24 grams or LESS       Exercise: Aim for 150 min of moderate intensity exercise weekly for heart health, and weights twice weekly for bone health Stay active - any steps are better than no steps! Aim for 7-9 hours of sleep daily          Signed, Levi Aland, NP  01/17/2023 9:24 AM    [facility]

## 2023-01-17 ENCOUNTER — Encounter: Payer: Self-pay | Admitting: Nurse Practitioner

## 2023-01-17 ENCOUNTER — Ambulatory Visit: Payer: 59 | Attending: Nurse Practitioner | Admitting: Nurse Practitioner

## 2023-01-17 VITALS — BP 120/80 | HR 70 | Ht 65.0 in | Wt 180.0 lb

## 2023-01-17 DIAGNOSIS — I5042 Chronic combined systolic (congestive) and diastolic (congestive) heart failure: Secondary | ICD-10-CM

## 2023-01-17 DIAGNOSIS — I2510 Atherosclerotic heart disease of native coronary artery without angina pectoris: Secondary | ICD-10-CM | POA: Diagnosis not present

## 2023-01-17 DIAGNOSIS — E785 Hyperlipidemia, unspecified: Secondary | ICD-10-CM

## 2023-01-17 DIAGNOSIS — I159 Secondary hypertension, unspecified: Secondary | ICD-10-CM

## 2023-01-17 DIAGNOSIS — I639 Cerebral infarction, unspecified: Secondary | ICD-10-CM

## 2023-01-17 DIAGNOSIS — I472 Ventricular tachycardia, unspecified: Secondary | ICD-10-CM

## 2023-01-17 NOTE — Patient Instructions (Signed)
Medication Instructions:   Your physician recommends that you continue on your current medications as directed. Please refer to the Current Medication list given to you today.   *If you need a refill on your cardiac medications before your next appointment, please call your pharmacy*   Lab Work:  TODAY!!! BMET  If you have labs (blood work) drawn today and your tests are completely normal, you will receive your results only by: MyChart Message (if you have MyChart) OR A paper copy in the mail If you have any lab test that is abnormal or we need to change your treatment, we will call you to review the results.   Testing/Procedures:  None ordered.   Follow-Up: At [facility], you and your health needs are our priority.  As part of our continuing mission to provide you with exceptional heart care, we have created designated Provider Care Teams.  These Care Teams include your primary Cardiologist (physician) and Advanced Practice Providers (APPs -  Physician Assistants and Nurse Practitioners) who all work together to provide you with the care you need, when you need it.  We recommend signing up for the patient portal called "MyChart".  Sign up information is provided on this After Visit Summary.  MyChart is used to connect with patients for Virtual Visits (Telemedicine).  Patients are able to view lab/test results, encounter notes, upcoming appointments, etc.  Non-urgent messages can be sent to your provider as well.   To learn more about what you can do with MyChart, go to [email or web address].    Your next appointment:   2 month(s)  Provider:   Kristeen Miss, MD     Other Instructions  Mediterranean Diet A Mediterranean diet refers to food and lifestyle choices that are based on the traditions of countries located on the [place]. It focuses on eating more fruits, vegetables, whole grains, beans, nuts, seeds, and heart-healthy fats, and eating less dairy,  meat, eggs, and processed foods with added sugar, salt, and fat. This way of eating has been shown to help prevent certain conditions and improve outcomes for people who have chronic diseases, like kidney disease and heart disease. What are tips for following this plan? Reading food labels Check the serving size of packaged foods. For foods such as rice and pasta, the serving size refers to the amount of cooked product, not dry. Check the total fat in packaged foods. Avoid foods that have saturated fat or trans fats. Check the ingredient list for added sugars, such as corn syrup. Shopping  Buy a variety of foods that offer a balanced diet, including: Fresh fruits and vegetables (produce). Grains, beans, nuts, and seeds. Some of these may be available in unpackaged forms or large amounts (in bulk). Fresh seafood. Poultry and eggs. Low-fat dairy products. Buy whole ingredients instead of prepackaged foods. Buy fresh fruits and vegetables in-season from local farmers markets. Buy plain frozen fruits and vegetables. If you do not have access to quality fresh seafood, buy precooked frozen shrimp or canned fish, such as tuna, salmon, or sardines. Stock your pantry so you always have certain foods on hand, such as olive oil, canned tuna, canned tomatoes, rice, pasta, and beans. Cooking Cook foods with extra-virgin olive oil instead of using butter or other vegetable oils. Have meat as a side dish, and have vegetables or grains as your main dish. This means having meat in small portions or adding small amounts of meat to foods like pasta or stew. Use beans or vegetables  instead of meat in common dishes like chili or lasagna. Experiment with different cooking methods. Try roasting, broiling, steaming, and sauting vegetables. Add frozen vegetables to soups, stews, pasta, or rice. Add nuts or seeds for added healthy fats and plant protein at each meal. You can add these to yogurt, salads, or vegetable  dishes. Marinate fish or vegetables using olive oil, lemon juice, garlic, and fresh herbs. Meal planning Plan to eat one vegetarian meal one day each week. Try to work up to two vegetarian meals, if possible. Eat seafood two or more times a week. Have healthy snacks readily available, such as: Vegetable sticks with hummus. Greek yogurt. Fruit and nut trail mix. Eat balanced meals throughout the week. This includes: Fruit: 2-3 servings a day. Vegetables: 4-5 servings a day. Low-fat dairy: 2 servings a day. Fish, poultry, or lean meat: 1 serving a day. Beans and legumes: 2 or more servings a week. Nuts and seeds: 1-2 servings a day. Whole grains: 6-8 servings a day. Extra-virgin olive oil: 3-4 servings a day. Limit red meat and sweets to only a few servings a month. Lifestyle  Cook and eat meals together with your family, when possible. Drink enough fluid to keep your urine pale yellow. Be physically active every day. This includes: Aerobic exercise like running or swimming. Leisure activities like gardening, walking, or housework. Get 7-8 hours of sleep each night. If recommended by your health care provider, drink red wine in moderation. This means 1 glass a day for nonpregnant women and 2 glasses a day for men. A glass of wine equals 5 oz (150 mL). What foods should I eat? Fruits Apples. Apricots. Avocado. Berries. Bananas. Cherries. Dates. Figs. Grapes. Lemons. Melon. Oranges. Peaches. Plums. Pomegranate. Vegetables Artichokes. Beets. Broccoli. Cabbage. Carrots. Eggplant. Green beans. Chard. Kale. Spinach. Onions. Leeks. Peas. Squash. Tomatoes. Peppers. Radishes. Grains Whole-grain pasta. Brown rice. Bulgur wheat. Polenta. Couscous. Whole-wheat bread. Orpah Cobb. Meats and other proteins Beans. Almonds. Sunflower seeds. Pine nuts. Peanuts. Cod. Salmon. Scallops. Shrimp. Tuna. Tilapia. Clams. Oysters. Eggs. Poultry without skin. Dairy Low-fat milk. Cheese. Greek  yogurt. Fats and oils Extra-virgin olive oil. Avocado oil. Grapeseed oil. Beverages Water. Red wine. Herbal tea. Sweets and desserts Greek yogurt with honey. Baked apples. Poached pears. Trail mix. Seasonings and condiments Basil. Cilantro. Coriander. Cumin. Mint. Parsley. Sage. Rosemary. Tarragon. Garlic. Oregano. Thyme. Pepper. Balsamic vinegar. Tahini. Hummus. Tomato sauce. Olives. Mushrooms. The items listed above may not be a complete list of foods and beverages you can eat. Contact a dietitian for more information. What foods should I limit? This is a list of foods that should be eaten rarely or only on special occasions. Fruits Fruit canned in syrup. Vegetables Deep-fried potatoes (french fries). Grains Prepackaged pasta or rice dishes. Prepackaged cereal with added sugar. Prepackaged snacks with added sugar. Meats and other proteins Beef. Pork. Lamb. Poultry with skin. Hot dogs. Tomasa Blase. Dairy Ice cream. Sour cream. Whole milk. Fats and oils Butter. Canola oil. Vegetable oil. Beef fat (tallow). Lard. Beverages Juice. Sugar-sweetened soft drinks. Beer. Liquor and spirits. Sweets and desserts Cookies. Cakes. Pies. Candy. Seasonings and condiments Mayonnaise. Pre-made sauces and marinades. The items listed above may not be a complete list of foods and beverages you should limit. Contact a dietitian for more information. Summary The Mediterranean diet includes both food and lifestyle choices. Eat a variety of fresh fruits and vegetables, beans, nuts, seeds, and whole grains. Limit the amount of red meat and sweets that you eat. If recommended by your  health care provider, drink red wine in moderation. This means 1 glass a day for nonpregnant women and 2 glasses a day for men. A glass of wine equals 5 oz (150 mL). This information is not intended to replace advice given to you by your health care provider. Make sure you discuss any questions you have with your health care  provider. Document Revised: 09/10/2019 Document Reviewed: 07/08/2019 Elsevier Patient Education  2023 Elsevier Inc. Adopting a Healthy Lifestyle.   Weight: Know what a healthy weight is for you (roughly BMI <25) and aim to maintain this. You can calculate your body mass index on your smart phone  Diet: Aim for 7+ servings of fruits and vegetables daily Limit animal fats in diet for cholesterol and heart health - choose grass fed whenever available Avoid highly processed foods (fast food burgers, tacos, fried chicken, pizza, hot dogs, french fries)  Saturated fat comes in the form of butter, lard, coconut oil, margarine, partially hydrogenated oils, and fat in meat. These increase your risk of cardiovascular disease.  Use healthy plant oils, such as olive, canola, soy, corn, sunflower and peanut.  Whole foods such as fruits, vegetables and whole grains have fiber  Men need > 38 grams of fiber per day Women need > 25 grams of fiber per day  Load up on vegetables and fruits - one-half of your plate: Aim for color and variety, and remember that potatoes dont count. Go for whole grains - one-quarter of your plate: Whole wheat, barley, wheat berries, quinoa, oats, brown rice, and foods made with them. If you want pasta, go with whole wheat pasta. Protein power - one-quarter of your plate: Fish, chicken, beans, and nuts are all healthy, versatile protein sources. Limit red meat. You need carbohydrates for energy! The type of carbohydrate is more important than the amount. Choose carbohydrates such as vegetables, fruits, whole grains, beans, and nuts in the place of white rice, white pasta, potatoes (baked or fried), macaroni and cheese, cakes, cookies, and donuts.  If youre thirsty, drink water. Coffee and tea are good in moderation, but skip sugary drinks and limit milk and dairy products to one or two daily servings. Keep sugar intake at 6 teaspoons or 24 grams or LESS       Exercise: Aim for  150 min of moderate intensity exercise weekly for heart health, and weights twice weekly for bone health Stay active - any steps are better than no steps! Aim for 7-9 hours of sleep daily

## 2023-01-18 LAB — BASIC METABOLIC PANEL
BUN/Creatinine Ratio: 9 — ABNORMAL LOW (ref 12–28)
BUN: 10 mg/dL (ref 8–27)
CO2: 23 mmol/L (ref 20–29)
Calcium: 9.4 mg/dL (ref 8.7–10.3)
Chloride: 102 mmol/L (ref 96–106)
Creatinine, Ser: 1.12 mg/dL — ABNORMAL HIGH (ref 0.57–1.00)
Glucose: 123 mg/dL — ABNORMAL HIGH (ref 70–99)
Potassium: 4.3 mmol/L (ref 3.5–5.2)
Sodium: 141 mmol/L (ref 134–144)
eGFR: 50 mL/min/{1.73_m2} — ABNORMAL LOW (ref >59)

## 2023-01-23 NOTE — Progress Notes (Addendum)
HEART & VASCULAR TRANSITION OF CARE CONSULT NOTE     Referring Physician: Dr. Jacques Navy Primary Care: Loura Back, NP Primary Cardiologist: Dr. Elease Hashimoto  HPI: Referred to clinic by Dr. Jacques Navy with [facility] for heart failure consultation. 77 y.o. female with history of CAD, HFrEF, VT, DM II, CVA, HTN, HLD.  Echo 04/23: EF 20-25%, RV mildly reduced  She had not been followed by Cardiology.   Admitted with subacute CVA in 08/23. 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 Cardiology consulted for evaluation of cardiomyopathy. LHC 08/23: 40% ostial LM, 40% ostial LAD, 80% m LAD involving origins of 2 large diagonals, 70% RV branch. She was managed medically. Cardiomyopathy felt to be out of proportion to CAD. Echo during admit with EF 20-25%, RV low normal. She underwent placement of implantable loop recorder.  Echo 04/24: EF 25-30%, RV okay, mild to moderate MR  She was admitted 12/30/22 after she had sustained polymorphic ventricular tachycardia documented on loop recorder 05/11.  VT felt to be likely ischemic. She was started on IV amiodarone and later transitioned to po. LHC demonstrated severe disease p to m LAD and moderate RV branch (small). Underwent IVUS guided PCI/Shockwave lithotripsy/DES to mid LAD.  She was readmitted 05/20 with acute hypoxic respiratory failure 2/2 a/c CHF. She was diuresed with IV lasix. Discharged on 40 mg lasix daily. GDMT titrated.  She is here today for hospital follow-up. She has been feeling well. No dyspnea, orthopnea, PND or lower extremity edema. Her home weight has been stable around 180 lb. She has been taking all medications as prescribed. Notes a couple of episodes of weakness and feeling like her legs may give out. Both days this occurred she had not been drinking enough fluids. No dizziness or presyncope. She has been watching sodium intake closely, using spices instead of salt shaker.   She drives but does not currently own a car. Uses public  transportation, friends or rides through her insurance.    Past Medical History:  Diagnosis Date   Allergy    CAD (coronary artery disease)    a. 03/2022 Cath: LM 40ost, 70m, LAD 40ost/p, 51m, 70m, LCX 86m, RCA nl, RV branch 70-->Med rx.   Chronic HFrEF (heart failure with reduced ejection fraction) (HCC)    a. 11/2021 Echo: EF 20-25%; b. 03/2022 Echo: EF 20-25%; c. 11/2022 Echo: EF 25-30%, glob HK.   Diabetes (HCC)    Diverticulosis    GERD (gastroesophageal reflux disease)    Hiatal hernia    Hyperlipidemia    Hypertension    Implantable loop recorder present    a. 03/2022 s/p Abbott IQ EL+ (serial [id number]) ILR in setting of cryptogenic stroke.   Ischemic cardiomyopathy    a. 11/2021 Echo: EF 20-25%; b. 03/2022 Echo: EF 20-25%; c. 11/2022 Echo: EF 25-30%, glob HK, nl RV fxn, mild-mod MR, mild AI, AoV sclerosis.   Neuropathy    feet   Schatzki's ring    Stroke [facility])    a. 03/2022 MRI Brain: subacute cortical infarct involving inf and med L occipial pole w/ petechial hemorrhage; b. 03/2022 s/p Abbott Assert IQ EL ILR.   Ventricular tachycardia (HCC)    a. 12/2022 noted on ILR.    Current Outpatient Medications  Medication Sig Dispense Refill   amiodarone (PACERONE) 200 MG tablet Take 200 mg by mouth as directed. Starting Thu 01/02/2023, Until Wed 01/15/2023 THEN Starting Thu 01/16/2023 Until Wed 01/29/2023 THEN Starting Thu 01/30/2023 Until Sat 02/08/2023 Take 2 tablets ( 400 mg)  by mouth 2 (two) times daily for 14 days, THEN 1 tablet ( 200 mg) 2 (two times daily for 14 days,THEN 1 (one) tablet ( 200 mg) total daily. Taking 200 mg twice daily     aspirin 81 MG chewable tablet Chew 1 tablet (81 mg total) by mouth daily. 90 tablet 3   atorvastatin (LIPITOR) 80 MG tablet Take 80 mg by mouth at bedtime.     clopidogrel (PLAVIX) 75 MG tablet Take 1 tablet (75 mg total) by mouth daily. 30 tablet 11   FARXIGA 10 MG TABS tablet Take 10 mg by mouth daily.     gabapentin (NEURONTIN) 600 MG tablet Take  600 mg by mouth 2 (two) times daily.     magnesium oxide (MAG-OX) 400 MG tablet Take 400 mg by mouth daily.     metFORMIN (GLUCOPHAGE-XR) 500 MG 24 hr tablet Take 500 mg by mouth daily with breakfast.     metoprolol succinate (TOPROL-XL) 50 MG 24 hr tablet Take 50 mg by mouth daily.     Multiple Vitamin (MULTIVITAMIN) tablet Take 1 tablet by mouth daily.     OZEMPIC, 1 MG/DOSE, 4 MG/3ML SOPN Inject 1 mg into the skin once a week.     pantoprazole (PROTONIX) 20 MG tablet Take 1 tablet (20 mg total) by mouth daily. 90 tablet 3   potassium chloride (KLOR-CON M) 10 MEQ tablet Take 1 tablet (10 mEq total) by mouth daily. 30 tablet 5   sacubitril-valsartan (ENTRESTO) 49-51 MG Take 1 tablet by mouth 2 (two) times daily. 60 tablet 11   spironolactone (ALDACTONE) 25 MG tablet Take 25 mg by mouth daily.     TRESIBA FLEXTOUCH 200 UNIT/ML SOPN Take 80 Units by mouth at bedtime.      nitroGLYCERIN (NITROSTAT) 0.4 MG SL tablet Place 1 tablet (0.4 mg total) under the tongue every 5 (five) minutes x 3 doses as needed for chest pain. (Patient not taking: Reported on 01/24/2023) 25 tablet 2   No current facility-administered medications for this encounter.    No Known Allergies    Social History   Socioeconomic History   Marital status: Single    Spouse name: Not on file   Number of children: 2   Years of education: Not on file   Highest education level: High school graduate  Occupational History   Occupation: Retired  Tobacco Use   Smoking status: Former    Types: Cigarettes    Quit date: 08/20/2003    Years since quitting: 19.4   Smokeless tobacco: Never  Vaping Use   Vaping Use: Never used  Substance and Sexual Activity   Alcohol use: No   Drug use: No   Sexual activity: Not on file  Other Topics Concern   Not on file  Social History Narrative   Not on file   Social Determinants of Health   Financial Resource Strain: Low Risk  (01/07/2023)   Overall Financial Resource Strain (CARDIA)     Difficulty of Paying Living Expenses: Not hard at all  Food Insecurity: No Food Insecurity (01/07/2023)   Hunger Vital Sign    Worried About Running Out of Food in the Last Year: Never true    Ran Out of Food in the Last Year: Never true  Transportation Needs: No Transportation Needs (01/07/2023)   PRAPARE - Administrator, Civil Service (Medical): No    Lack of Transportation (Non-Medical): No  Physical Activity: Not on file  Stress: Not on file  Social Connections: Not on file  Intimate Partner Violence: Not At Risk (01/07/2023)   Humiliation, Afraid, Rape, and Kick questionnaire    Fear of Current or Ex-Partner: No    Emotionally Abused: No    Physically Abused: No    Sexually Abused: No      Family History  Problem Relation Age of Onset   Colon cancer Mother 28   Heart attack Father    Cancer Sister        "female cancer"   Heart attack Brother    Stomach cancer Neg Hx    Pancreatic cancer Neg Hx    Esophageal cancer Neg Hx     Vitals:   01/24/23 0902  BP: 124/84  Pulse: (!) 48  SpO2: 96%  Weight: 80.9 kg (178 lb 6.4 oz)  Height: 5\' 5"  (1.651 m)    PHYSICAL EXAM: General:  Well appearing elderly female HEENT: normal Neck: supple. no JVD. Carotids 2+ bilat; no bruits.  Cor: PMI nondisplaced. Regular rate & rhythm. No rubs, gallops or murmurs. Lungs: clear Abdomen: soft, nontender, nondistended.  Extremities: no cyanosis, clubbing, rash, edema Neuro: alert & oriented x 3. Affect pleasant.  ECG: Sinus brady 49 bpm, Qtc 473 ms  ReDS 33%   ASSESSMENT & PLAN: HFrEF -Echo 04/23: EF 20-25%, RV mildly reduced -Echo 08/23: EF 20-25%, RV low normal -Echo 04/24: EF 25-30%, RV okay, mild to moderate MR -LHC 12/31/22 after sustained polymorphic VT: Severe disease p to m LAD and moderate disease RV branch. S/p IVUS guided PCI/Shockwave lithotripsy/DES X 1 -It was previously felt that cardiomyopathy was out of proportion to CAD NYHA II Volume stable by  exam and ReDS (33%). She's had a couple episodes of weakness on days she hasn't been drinking much fluid. Cut back lasix to 20 mg daily. Can take extra 20 mg lasix as needed. Stop potassium supplement. BB-Decrease metoprolol xl to 50 mg daily d/t bradycardia Ace/ARB/ARNI-Entresto 49/51 mg BID MRA-Change spiro from 25 mg qod to 25 mg daily SGLT2i-Farxiga 10 mg daily -BMET stable last week, repeat labs in 1 week -Repeat echo in next couple of months to reassess LV function and EF. If EF not improving, ? If cMRI may be helpful.  Coronary artery disease -LHC 05/24 as above. S/p IVUS guided PCI/Shockwave lithotripsy/DES X 1 -Continue DAPT with aspirin and plavix -On 80 mg Atorvastatin daily. Goal LDL < 55.  VT -Polymorphic VT noted on loop recorder 05/24. QT prolonged on admit.  -Seen by EP.  Suspected d/t CAD. Held off on ICD d/t recent stent placement. -Completing amiodarone taper. TSH and LFTs okay in 05/24.  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 Seen by Neurology -Loop recorder was placed 08/23.  No AF documented -Has been on aspirin, plavix and statin  HLD -On Atorvastatin 80 mg daily -Last LDL excellent, 41 mg/dL.  HTN -BP stable -GDMT as above    Referred to HFSW (PCP, Medications, Transportation, ETOH Abuse, Drug Abuse, Insurance, Financial ): No Refer to Pharmacy: No Refer to Home Health: No Refer to Advanced Heart Failure Clinic: Yes Refer to General Cardiology: No, already established  Follow up  3-4 weeks to establish care with Dr. Gasper Lloyd, will follow along with Dr. Elease Hashimoto

## 2023-01-24 ENCOUNTER — Ambulatory Visit (HOSPITAL_COMMUNITY)
Admission: RE | Admit: 2023-01-24 | Discharge: 2023-01-24 | Disposition: A | Discharge status: Home / Self Care | Payer: 59 | Source: Ambulatory Visit | Attending: Physician Assistant | Admitting: Physician Assistant

## 2023-01-24 ENCOUNTER — Encounter (HOSPITAL_COMMUNITY): Payer: Self-pay

## 2023-01-24 VITALS — BP 124/84 | HR 48 | Ht 65.0 in | Wt 178.4 lb

## 2023-01-24 DIAGNOSIS — Z794 Long term (current) use of insulin: Secondary | ICD-10-CM | POA: Diagnosis not present

## 2023-01-24 DIAGNOSIS — E785 Hyperlipidemia, unspecified: Secondary | ICD-10-CM | POA: Diagnosis not present

## 2023-01-24 DIAGNOSIS — Z7902 Long term (current) use of antithrombotics/antiplatelets: Secondary | ICD-10-CM | POA: Insufficient documentation

## 2023-01-24 DIAGNOSIS — E782 Mixed hyperlipidemia: Secondary | ICD-10-CM | POA: Diagnosis not present

## 2023-01-24 DIAGNOSIS — I5022 Chronic systolic (congestive) heart failure: Secondary | ICD-10-CM | POA: Diagnosis not present

## 2023-01-24 DIAGNOSIS — Z8673 Personal history of transient ischemic attack (TIA), and cerebral infarction without residual deficits: Secondary | ICD-10-CM | POA: Diagnosis not present

## 2023-01-24 DIAGNOSIS — Z7984 Long term (current) use of oral hypoglycemic drugs: Secondary | ICD-10-CM | POA: Diagnosis not present

## 2023-01-24 DIAGNOSIS — Z79899 Other long term (current) drug therapy: Secondary | ICD-10-CM | POA: Diagnosis not present

## 2023-01-24 DIAGNOSIS — Z8249 Family history of ischemic heart disease and other diseases of the circulatory system: Secondary | ICD-10-CM | POA: Insufficient documentation

## 2023-01-24 DIAGNOSIS — E119 Type 2 diabetes mellitus without complications: Secondary | ICD-10-CM | POA: Insufficient documentation

## 2023-01-24 DIAGNOSIS — Z955 Presence of coronary angioplasty implant and graft: Secondary | ICD-10-CM | POA: Insufficient documentation

## 2023-01-24 DIAGNOSIS — I110 Hypertensive heart disease with heart failure: Secondary | ICD-10-CM | POA: Insufficient documentation

## 2023-01-24 DIAGNOSIS — I472 Ventricular tachycardia, unspecified: Secondary | ICD-10-CM

## 2023-01-24 DIAGNOSIS — Z87891 Personal history of nicotine dependence: Secondary | ICD-10-CM | POA: Diagnosis not present

## 2023-01-24 DIAGNOSIS — I2510 Atherosclerotic heart disease of native coronary artery without angina pectoris: Secondary | ICD-10-CM | POA: Diagnosis not present

## 2023-01-24 DIAGNOSIS — I10 Essential (primary) hypertension: Secondary | ICD-10-CM

## 2023-01-24 LAB — BASIC METABOLIC PANEL
Anion gap: 10 (ref 5–15)
BUN: 19 mg/dL (ref 8–23)
CO2: 25 mmol/L (ref 22–32)
Calcium: 9.3 mg/dL (ref 8.9–10.3)
Chloride: 102 mmol/L (ref 98–111)
Creatinine, Ser: 1.43 mg/dL — ABNORMAL HIGH (ref 0.44–1.00)
GFR, Estimated: 38 mL/min — ABNORMAL LOW (ref >60)
Glucose, Bld: 107 mg/dL — ABNORMAL HIGH (ref 70–99)
Potassium: 4.2 mmol/L (ref 3.5–5.1)
Sodium: 137 mmol/L (ref 135–145)

## 2023-01-24 MED ORDER — METOPROLOL SUCCINATE ER 50 MG PO TB24
50.0000 mg | ORAL_TABLET | Freq: Every day | ORAL | 3 refills | Status: DC
Start: 2023-01-24 — End: 2023-05-09

## 2023-01-24 MED ORDER — FUROSEMIDE 20 MG PO TABS
20.0000 mg | ORAL_TABLET | Freq: Every day | ORAL | 3 refills | Status: DC
Start: 2023-01-24 — End: 2023-11-04

## 2023-01-24 MED ORDER — SPIRONOLACTONE 25 MG PO TABS
25.0000 mg | ORAL_TABLET | Freq: Every day | ORAL | 3 refills | Status: AC
Start: 2023-01-24 — End: ?

## 2023-01-24 NOTE — Progress Notes (Signed)
ReDS Vest / Clip - 01/24/23 0902       ReDS Vest / Clip   Station Marker A    Ruler Value 26    ReDS Value Range Low volume    ReDS Actual Value 33

## 2023-01-24 NOTE — Patient Instructions (Addendum)
Take Spiro 25 mg daily. Decrease Metoprolol to 50 mg once daily. Decrease Lasix to 20 mg daily. May take 20 mg extra as needed on days you have weight gain > 3 lbs overnight, lower extremity swelling, or increased SOB. Stop potassium.  Return in one week for lab work only. Return to see Dr. Gasper Lloyd in Heart Failure Clinic in July. Labs today - will call you if abnormal. Please call us at [phone] if any questions or concerns.   You may call [facility] at [phone] to check on when you can start. Per your provider today, it is safe to start rehab now.

## 2023-01-27 ENCOUNTER — Ambulatory Visit (INDEPENDENT_AMBULATORY_CARE_PROVIDER_SITE_OTHER): Payer: 59 | Admitting: Podiatry

## 2023-01-27 ENCOUNTER — Encounter: Payer: Self-pay | Admitting: Podiatry

## 2023-01-27 ENCOUNTER — Ambulatory Visit (INDEPENDENT_AMBULATORY_CARE_PROVIDER_SITE_OTHER): Payer: 59

## 2023-01-27 DIAGNOSIS — E1169 Type 2 diabetes mellitus with other specified complication: Secondary | ICD-10-CM

## 2023-01-27 DIAGNOSIS — M79676 Pain in unspecified toe(s): Secondary | ICD-10-CM | POA: Diagnosis not present

## 2023-01-27 DIAGNOSIS — I639 Cerebral infarction, unspecified: Secondary | ICD-10-CM | POA: Diagnosis not present

## 2023-01-27 DIAGNOSIS — B351 Tinea unguium: Secondary | ICD-10-CM

## 2023-01-27 NOTE — Progress Notes (Signed)
  Subjective:  Patient ID: Paula Wong, female    DOB: 11-09-1945,   MRN: [id number]  No chief complaint on file.   77 y.o. female presents concern of thickened elongated and painful nails that are difficult to trim. Requesting to have them trimmed today. Relates burning and tingling in their feet. Patient is diabetic and last A1c was  Lab Results  Component Value Date   HGBA1C 6.3 (H) 04/13/2022   .   PCP:  Loura Back, NP     PCP:  Loura Back, NP    . Denies any other pedal complaints. Denies n/v/f/c.   Past Medical History:  Diagnosis Date   Allergy    CAD (coronary artery disease)    a. 03/2022 Cath: LM 40ost, 15m, LAD 40ost/p, 67m, 56m, LCX 51m, RCA nl, RV branch 70-->Med rx.   Chronic HFrEF (heart failure with reduced ejection fraction) (HCC)    a. 11/2021 Echo: EF 20-25%; b. 03/2022 Echo: EF 20-25%; c. 11/2022 Echo: EF 25-30%, glob HK.   Diabetes (HCC)    Diverticulosis    GERD (gastroesophageal reflux disease)    Hiatal hernia    Hyperlipidemia    Hypertension    Implantable loop recorder present    a. 03/2022 s/p Abbott IQ EL+ (serial [id number]) ILR in setting of cryptogenic stroke.   Ischemic cardiomyopathy    a. 11/2021 Echo: EF 20-25%; b. 03/2022 Echo: EF 20-25%; c. 11/2022 Echo: EF 25-30%, glob HK, nl RV fxn, mild-mod MR, mild AI, AoV sclerosis.   Neuropathy    feet   Schatzki's ring    Stroke [facility])    a. 03/2022 MRI Brain: subacute cortical infarct involving inf and med L occipial pole w/ petechial hemorrhage; b. 03/2022 s/p Abbott Assert IQ EL ILR.   Ventricular tachycardia (HCC)    a. 12/2022 noted on ILR.    Objective:  Physical Exam: Vascular: DP/PT pulses 2/4 bilateral. CFT <3 seconds. Absent hair growth on digits. Edema noted to bilateral lower extremities. Xerosis noted bilaterally.  Skin. No lacerations or abrasions bilateral feet. Nails 1-5 bilateral  are thickened discolored and elongated with subungual debris.  Musculoskeletal: MMT 5/5 bilateral lower  extremities in DF, PF, Inversion and Eversion. Deceased ROM in DF of ankle joint.  Neurological: Sensation intact to light touch. Protective sensation intact bilateral.    Assessment:   1. Pain due to onychomycosis of nail   2. Type 2 diabetes mellitus with other specified complication, without long-term current use of insulin (HCC)       Plan:  Patient was evaluated and treated and all questions answered. -Discussed and educated patient on diabetic foot care, especially with  regards to the vascular, neurological and musculoskeletal systems.  -Stressed the importance of good glycemic control and the detriment of not  controlling glucose levels in relation to the foot. -Discussed supportive shoes at all times and checking feet regularly.  -Mechanically debrided all nails 1-5 bilateral using sterile nail nipper and filed with dremel without incident  -Answered all patient questions -Patient to return  in 3 months for at risk foot care -Patient advised to call the office if any problems or questions arise in the meantime.    Louann Sjogren, DPM

## 2023-01-28 LAB — CUP PACEART REMOTE DEVICE CHECK
Date Time Interrogation Session: 20240609020040
Implantable Pulse Generator Implant Date: 20230828
Pulse Gen Serial Number: 511012456

## 2023-01-31 ENCOUNTER — Ambulatory Visit (HOSPITAL_COMMUNITY)
Admission: RE | Admit: 2023-01-31 | Discharge: 2023-01-31 | Disposition: A | Discharge status: Home / Self Care | Payer: 59 | Source: Ambulatory Visit | Attending: Cardiology | Admitting: Cardiology

## 2023-01-31 DIAGNOSIS — I5022 Chronic systolic (congestive) heart failure: Secondary | ICD-10-CM | POA: Insufficient documentation

## 2023-01-31 LAB — BASIC METABOLIC PANEL
Anion gap: 10 (ref 5–15)
BUN: 14 mg/dL (ref 8–23)
CO2: 23 mmol/L (ref 22–32)
Calcium: 9.1 mg/dL (ref 8.9–10.3)
Chloride: 105 mmol/L (ref 98–111)
Creatinine, Ser: 1.25 mg/dL — ABNORMAL HIGH (ref 0.44–1.00)
GFR, Estimated: 44 mL/min — ABNORMAL LOW (ref >60)
Glucose, Bld: 121 mg/dL — ABNORMAL HIGH (ref 70–99)
Potassium: 4.3 mmol/L (ref 3.5–5.1)
Sodium: 138 mmol/L (ref 135–145)

## 2023-02-04 ENCOUNTER — Other Ambulatory Visit: Payer: Self-pay

## 2023-02-18 ENCOUNTER — Encounter (HOSPITAL_COMMUNITY): Payer: 59 | Admitting: Cardiology

## 2023-02-18 NOTE — Progress Notes (Signed)
[employer] Loop Recorder 

## 2023-02-27 ENCOUNTER — Ambulatory Visit: Payer: 59

## 2023-02-27 DIAGNOSIS — I639 Cerebral infarction, unspecified: Secondary | ICD-10-CM | POA: Diagnosis not present

## 2023-02-27 LAB — CUP PACEART REMOTE DEVICE CHECK
Date Time Interrogation Session: 20240710020402
Implantable Pulse Generator Implant Date: 20230828
Pulse Gen Serial Number: 511012456

## 2023-03-06 ENCOUNTER — Encounter (HOSPITAL_COMMUNITY): Payer: Self-pay | Admitting: Cardiology

## 2023-03-06 ENCOUNTER — Ambulatory Visit (HOSPITAL_COMMUNITY)
Admission: RE | Admit: 2023-03-06 | Discharge: 2023-03-06 | Disposition: A | Discharge status: Home / Self Care | Payer: 59 | Source: Ambulatory Visit | Attending: Cardiology | Admitting: Cardiology

## 2023-03-06 VITALS — BP 148/88 | HR 67 | Wt 181.6 lb

## 2023-03-06 DIAGNOSIS — I4729 Other ventricular tachycardia: Secondary | ICD-10-CM | POA: Insufficient documentation

## 2023-03-06 DIAGNOSIS — Z91148 Patient's other noncompliance with medication regimen for other reason: Secondary | ICD-10-CM | POA: Insufficient documentation

## 2023-03-06 DIAGNOSIS — Z79899 Other long term (current) drug therapy: Secondary | ICD-10-CM | POA: Diagnosis not present

## 2023-03-06 DIAGNOSIS — I110 Hypertensive heart disease with heart failure: Secondary | ICD-10-CM | POA: Insufficient documentation

## 2023-03-06 DIAGNOSIS — E785 Hyperlipidemia, unspecified: Secondary | ICD-10-CM | POA: Diagnosis not present

## 2023-03-06 DIAGNOSIS — I639 Cerebral infarction, unspecified: Secondary | ICD-10-CM | POA: Diagnosis not present

## 2023-03-06 DIAGNOSIS — I5022 Chronic systolic (congestive) heart failure: Secondary | ICD-10-CM | POA: Diagnosis not present

## 2023-03-06 DIAGNOSIS — I472 Ventricular tachycardia, unspecified: Secondary | ICD-10-CM | POA: Diagnosis not present

## 2023-03-06 DIAGNOSIS — Z7902 Long term (current) use of antithrombotics/antiplatelets: Secondary | ICD-10-CM | POA: Diagnosis not present

## 2023-03-06 DIAGNOSIS — Z955 Presence of coronary angioplasty implant and graft: Secondary | ICD-10-CM | POA: Insufficient documentation

## 2023-03-06 DIAGNOSIS — Z7982 Long term (current) use of aspirin: Secondary | ICD-10-CM | POA: Diagnosis not present

## 2023-03-06 DIAGNOSIS — I2510 Atherosclerotic heart disease of native coronary artery without angina pectoris: Secondary | ICD-10-CM

## 2023-03-06 DIAGNOSIS — Z8673 Personal history of transient ischemic attack (TIA), and cerebral infarction without residual deficits: Secondary | ICD-10-CM | POA: Insufficient documentation

## 2023-03-06 DIAGNOSIS — I255 Ischemic cardiomyopathy: Secondary | ICD-10-CM | POA: Diagnosis not present

## 2023-03-06 DIAGNOSIS — E1140 Type 2 diabetes mellitus with diabetic neuropathy, unspecified: Secondary | ICD-10-CM | POA: Diagnosis not present

## 2023-03-06 DIAGNOSIS — I10 Essential (primary) hypertension: Secondary | ICD-10-CM

## 2023-03-06 LAB — BASIC METABOLIC PANEL
Anion gap: 8 (ref 5–15)
BUN: 8 mg/dL (ref 8–23)
CO2: 27 mmol/L (ref 22–32)
Calcium: 9.2 mg/dL (ref 8.9–10.3)
Chloride: 107 mmol/L (ref 98–111)
Creatinine, Ser: 1.14 mg/dL — ABNORMAL HIGH (ref 0.44–1.00)
GFR, Estimated: 49 mL/min — ABNORMAL LOW (ref >60)
Glucose, Bld: 90 mg/dL (ref 70–99)
Potassium: 3.8 mmol/L (ref 3.5–5.1)
Sodium: 142 mmol/L (ref 135–145)

## 2023-03-06 LAB — HEMOGLOBIN AND HEMATOCRIT, BLOOD
HCT: 49.3 % — ABNORMAL HIGH (ref 36.0–46.0)
Hemoglobin: 15.2 g/dL — ABNORMAL HIGH (ref 12.0–15.0)

## 2023-03-06 LAB — HEMOGLOBIN A1C
Hgb A1c MFr Bld: 6.2 % — ABNORMAL HIGH (ref 4.8–5.6)
Mean Plasma Glucose: 131 mg/dL

## 2023-03-06 LAB — BRAIN NATRIURETIC PEPTIDE: B Natriuretic Peptide: 520 pg/mL — ABNORMAL HIGH (ref 0.0–100.0)

## 2023-03-06 MED ORDER — ENTRESTO 97-103 MG PO TABS: 1.0000 | ORAL_TABLET | Freq: Two times a day (BID) | ORAL | 5 refills | Status: AC

## 2023-03-06 NOTE — Patient Instructions (Signed)
Medication Changes:  INCREASE ENTRESTO TO 97/103MG  TWICE DAILY   YOU CAN TAKE AN EXTRA DOSE OF LASIX (FUROSEMIDE) FOR FLUID BUILD UP   Lab Work:  Labs done today, your results will be available in MyChart, we will contact you for abnormal readings.  Testing/Procedures:   [facility] [address] [place], [place] [address] [phone] Please take advantage of the free valet parking available at the [facility] and [employer] (Entrance C).  Proceed to the [facility] Radiology Department (First Floor) for check-in.    Magnetic resonance imaging (MRI) is a painless test that produces images of the inside of the body without using Xrays.  During an MRI, strong magnets and radio waves work together in a Data processing manager to form detailed images.   MRI images may provide more details about a medical condition than X-rays, CT scans, and ultrasounds can provide.  You may be given earphones to listen for instructions.  You may eat a light breakfast and take medications as ordered with the exception of furosemide, hydrochlorothiazide, or spironolactone(fluid pill, other). Please avoid stimulants for 12 hr prior to test. (Ie. Caffeine, nicotine, chocolate, or antihistamine medications)  If a contrast material will be used, an IV will be inserted into one of your veins. Contrast material will be injected into your IV. It will leave your body through your urine within a day. You may be told to drink plenty of fluids to help flush the contrast material out of your system.  You will be asked to remove all metal, including: Watch, jewelry, and other metal objects including hearing aids, hair pieces and dentures. Also wearable glucose monitoring systems (ie. Freestyle Libre and Omnipods) (Braces and fillings normally are not a problem.)  TEST WILL TAKE APPROXIMATELY 1 HOUR  PLEASE NOTIFY SCHEDULING AT LEAST 24 HOURS IN ADVANCE IF YOU ARE UNABLE TO KEEP YOUR  APPOINTMENT. [phone]  For more information and frequently asked questions, please visit our website : [email or web address]  Please call Rockwell Alexandria, cardiac imaging nurse navigator with any questions/concerns. Rockwell Alexandria RN Navigator Cardiac Imaging Larey Brick RN Navigator Cardiac Imaging [facility] [phone] Office   Follow-Up in: 2 months as scheduled with Dr. Gasper Lloyd  At the [facility], you and your health needs are our priority. We have a designated team specialized in the treatment of Heart Failure. This Care Team includes your primary Heart Failure Specialized Cardiologist (physician), Advanced Practice Providers (APPs- Physician Assistants and Nurse Practitioners), and Pharmacist who all work together to provide you with the care you need, when you need it.   You may see any of the following providers on your designated Care Team at your next follow up:  Dr. Arvilla Meres Dr. Marca Ancona Dr. Marcos Eke, NP Robbie Lis, [place] [facility] [place], [place] Brynda Peon, NP Karle Plumber, PharmD   Please be sure to bring in all your medications bottles to every appointment.   Need to Contact us:  If you have any questions or concerns before your next appointment please send [place] a message through [place] or call our office at [phone].    TO LEAVE A MESSAGE FOR THE NURSE SELECT OPTION 2, PLEASE LEAVE A MESSAGE INCLUDING: YOUR NAME DATE OF BIRTH CALL BACK NUMBER REASON FOR CALL**this is important as we prioritize the call backs  YOU WILL RECEIVE A CALL BACK THE SAME DAY AS LONG AS YOU CALL BEFORE 4:00 PM

## 2023-03-06 NOTE — Progress Notes (Signed)
ADVANCED HEART FAILURE CLINIC NOTE  Referring Physician: Loura Back, NP  Primary Care: Loura Back, NP Primary Cardiologist: Leodis Sias, MD  HPI: Paula Wong is a 77 y.o. female with CAD, HFrEF, history of VT, type 2 diabetes, history of stroke, hypertension, hyperlipidemia presenting today to establish care.  She was admitted to [facility] for CVA in August 2023.  She had an echocardiogram at that time that demonstrated EF of 20 to 25%.  This was followed by left heart catheterization with 40% left main, 40% ostial LAD and 80% mid LAD disease.  Her cardiomyopathy was felt to be out of proportion to CAD and she was treated medically at that time with placement of implantable loop recorder.  In April 2024 she had a follow-up echo with persistently reduced EF and moderate mitral regurgitation.  In May 2024 she was admitted after an episode of sustained polymorphic VT that was felt to be ischemic.  She was started on IV amiodarone with repeat left heart catheterization demonstrating persistently severe disease now status post PCI to the mid LAD the lesion.  She was readmitted in 01/06/2023 with acute hypoxic respiratory failure due to decompensated heart failure.  Since that time she has been seen in [facility] clinic where medications have been further uptitrated.  Interval hx:  According to Ms. Poupard, since PCI  and recent discharge she feels "fine". She does not become dyspneic with exertion, she no longer has LE edema, chest pain or PND. She can perform all ADLs without difficulty. Compliant with medications.   Activity level/exercise tolerance:  NYHA IIB, although, I believe she is overstating her exercise capacity Orthopnea:  Sleeps on 2 pillows Paroxysmal noctural dyspnea:  no Chest pain/pressure:  no Orthostatic lightheadedness:  no Palpitations:  no Lower extremity edema:  no Presyncope/syncope:  no Cough:  no  Past Medical History:  Diagnosis Date   Allergy    CAD (coronary  artery disease)    a. 03/2022 Cath: LM 40ost, 16m, LAD 40ost/p, 57m, 38m, LCX 58m, RCA nl, RV branch 70-->Med rx.   Chronic HFrEF (heart failure with reduced ejection fraction) (HCC)    a. 11/2021 Echo: EF 20-25%; b. 03/2022 Echo: EF 20-25%; c. 11/2022 Echo: EF 25-30%, glob HK.   Diabetes (HCC)    Diverticulosis    GERD (gastroesophageal reflux disease)    Hiatal hernia    Hyperlipidemia    Hypertension    Implantable loop recorder present    a. 03/2022 s/p Abbott IQ EL+ (serial [id number]) ILR in setting of cryptogenic stroke.   Ischemic cardiomyopathy    a. 11/2021 Echo: EF 20-25%; b. 03/2022 Echo: EF 20-25%; c. 11/2022 Echo: EF 25-30%, glob HK, nl RV fxn, mild-mod MR, mild AI, AoV sclerosis.   Neuropathy    feet   Schatzki's ring    Stroke [facility])    a. 03/2022 MRI Brain: subacute cortical infarct involving inf and med L occipial pole w/ petechial hemorrhage; b. 03/2022 s/p Abbott Assert IQ EL ILR.   Ventricular tachycardia (HCC)    a. 12/2022 noted on ILR.    Current Outpatient Medications  Medication Sig Dispense Refill   aspirin 81 MG chewable tablet Chew 1 tablet (81 mg total) by mouth daily. 90 tablet 3   atorvastatin (LIPITOR) 80 MG tablet Take 80 mg by mouth at bedtime.     clopidogrel (PLAVIX) 75 MG tablet Take 1 tablet (75 mg total) by mouth daily. 30 tablet 11   FARXIGA 10 MG TABS tablet Take 10  mg by mouth daily.     furosemide (LASIX) 20 MG tablet Take 1 tablet (20 mg total) by mouth daily. May take extra 20 mg as needed for fluid 90 tablet 3   gabapentin (NEURONTIN) 600 MG tablet Take 600 mg by mouth 2 (two) times daily.     magnesium oxide (MAG-OX) 400 MG tablet Take 400 mg by mouth daily.     metFORMIN (GLUCOPHAGE-XR) 500 MG 24 hr tablet Take 500 mg by mouth daily with breakfast.     metoprolol succinate (TOPROL-XL) 50 MG 24 hr tablet Take 1 tablet (50 mg total) by mouth daily. 90 tablet 3   Multiple Vitamin (MULTIVITAMIN) tablet Take 1 tablet by mouth daily.     nitroGLYCERIN  (NITROSTAT) 0.4 MG SL tablet Place 1 tablet (0.4 mg total) under the tongue every 5 (five) minutes x 3 doses as needed for chest pain. 25 tablet 2   OZEMPIC, 1 MG/DOSE, 4 MG/3ML SOPN Inject 1 mg into the skin once a week.     pantoprazole (PROTONIX) 20 MG tablet Take 1 tablet (20 mg total) by mouth daily. 90 tablet 3   sacubitril-valsartan (ENTRESTO) 49-51 MG Take 1 tablet by mouth 2 (two) times daily. 60 tablet 11   spironolactone (ALDACTONE) 25 MG tablet Take 1 tablet (25 mg total) by mouth daily. 90 tablet 3   TRESIBA FLEXTOUCH 200 UNIT/ML SOPN Take 80 Units by mouth at bedtime.      No current facility-administered medications for this encounter.    No Known Allergies    Social History   Socioeconomic History   Marital status: Single    Spouse name: Not on file   Number of children: 2   Years of education: Not on file   Highest education level: High school graduate  Occupational History   Occupation: Retired  Tobacco Use   Smoking status: Former    Current packs/day: 0.00    Types: Cigarettes    Quit date: 08/20/2003    Years since quitting: 19.5   Smokeless tobacco: Never  Vaping Use   Vaping status: Never Used  Substance and Sexual Activity   Alcohol use: No   Drug use: No   Sexual activity: Not on file  Other Topics Concern   Not on file  Social History Narrative   Not on file   Social Determinants of Health   Financial Resource Strain: Low Risk  (01/07/2023)   Overall Financial Resource Strain (CARDIA)    Difficulty of Paying Living Expenses: Not hard at all  Food Insecurity: No Food Insecurity (01/07/2023)   Hunger Vital Sign    Worried About Running Out of Food in the Last Year: Never true    Ran Out of Food in the Last Year: Never true  Transportation Needs: No Transportation Needs (01/07/2023)   PRAPARE - Administrator, Civil Service (Medical): No    Lack of Transportation (Non-Medical): No  Physical Activity: Not on file  Stress: Not on file   Social Connections: Not on file  Intimate Partner Violence: Not At Risk (01/07/2023)   Humiliation, Afraid, Rape, and Kick questionnaire    Fear of Current or Ex-Partner: No    Emotionally Abused: No    Physically Abused: No    Sexually Abused: No      Family History  Problem Relation Age of Onset   Colon cancer Mother 58   Heart attack Father    Cancer Sister        "female  cancer"   Heart attack Brother    Stomach cancer Neg Hx    Pancreatic cancer Neg Hx    Esophageal cancer Neg Hx     PHYSICAL EXAM: Vitals:   03/06/23 0922  BP: (!) 148/88  Pulse: 67  SpO2: 97%   GENERAL: Well nourished, well developed, and in no apparent distress at rest.  HEENT: Negative for arcus senilis or xanthelasma. There is no scleral icterus.  The mucous membranes are pink and moist.   NECK: Supple, No masses. Normal carotid upstrokes without bruits. No masses or thyromegaly.    CHEST: There are no chest wall deformities. There is no chest wall tenderness. Respirations are unlabored.  Lungs- CTA B/L CARDIAC:  JVP: 8 cm H2O         Normal S1, S2  Normal rate with regular rhythm. No murmurs, rubs or gallops.  Pulses are 2+ and symmetrical in upper and lower extremities. No edema.  ABDOMEN: Soft, non-tender, non-distended. There are no masses or hepatomegaly. There are normal bowel sounds.  EXTREMITIES: Warm and well perfused with no cyanosis, clubbing.  LYMPHATIC: No axillary or supraclavicular lymphadenopathy.  NEUROLOGIC: Patient is oriented x3 with no focal or lateralizing neurologic deficits.  PSYCH: Patients affect is appropriate, there is no evidence of anxiety or depression.  SKIN: Warm and dry; no lesions or wounds.   DATA REVIEW  ECG: 01/24/23: sinus bradycardia as per my personal interpretation  ECHO: 12/06/22: LVEF 25%, normal RV function as per my personal interpretation  CATH: 12/31/22:   Mid Cx lesion is 5% stenosed.   Ost LAD to Prox LAD lesion is 40% stenosed.   Mid LAD-2  lesion is 30% stenosed.   Ost LM lesion is 40% stenosed.   Mid LM to Dist LM lesion is 30% stenosed.   RV Branch lesion is 70% stenosed.   Prox LAD to Mid LAD lesion is 85% stenosed.   Mid LAD-1 lesion is 85% stenosed.   A drug-eluting stent was successfully placed using a SYNERGY XD 3.0X24.   Post intervention, there is a 0% residual stenosis.   Post intervention, there is a 0% residual stenosis.   LV end diastolic pressure is moderately elevated.   2 vessel obstructive CAD with severe LAD stenosis sequentially with heavy calcification. Moderate RCA disease diffusely in a small branch Moderately elevated LVEDP 25 mm Hg Successful PCI of the mid LAD using IVUS guidance, Shockwave intracoronary lithotripsy and DES x 1.  03/27/22:  The left main has 40% ostial 30% distal stenosis; the LAD has 40% ostial to proximal stenosis followed by focal calcified 80% stenosis prior to the takeoff of a septal perforator and the second diagonal vessel with 30% mid stenosis; mild nonobstructive disease in the left circumflex vessel, and diffuse 70% stenosis in the marginal branch of the RCA.     ASSESSMENT & PLAN:  Heart failure with reduced ejection fraction Etiology of JY:NWGNFAOZ cardiomyopathy; plan for CMR to re-evaluate LVEFwith CMR.  NYHA class / AHA Stage: NYHA II, however likely understating the degree of her symptoms. Volume status & Diuretics: Euvolemic, continue Lasix 20 mg daily with repeat labs today Vasodilators:Increase Entresto 97/103mg  BID Beta-Blocker:Metoprolol 50mg , limited by HR HYQ:[id number] 25mg  Cardiometabolic:Farxiga 10mg  Devices therapies & Valvulopathies:Not indicated currently; plan on CMR to evaluate LV function.  Advanced therapies: Not indicated.  Limited by age will continue aggressive medical management.  2. Hypertension  - Did not take her medications today.  -Repeat labs today -Reports blood pressure at home ranges from  1 20-1 30.  Will increase  Entresto  3. CAD -Multivessel CAD with PCI to the LAD as noted above.  Continue Plavix for 1 year -Lipid for 80 mg daily with aspirin 81 mg  4. VT -Polymorphic VT noted on loop recorder 05/24. QT prolonged on admit.  -Seen by EP.  Suspected d/t CAD. Held off on ICD d/t recent stent placement. -Completing amiodarone taper. TSH and LFTs okay in 05/24.   5. 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 Seen by Neurology -Loop recorder was placed 08/23.  No AF documented -Has been on aspirin, plavix and statin   [place] Sult Advanced Heart Failure Mechanical Circulatory Support

## 2023-03-09 ENCOUNTER — Encounter: Payer: Self-pay | Admitting: Cardiovascular Disease

## 2023-03-09 NOTE — Progress Notes (Unsigned)
Cardiology Office Note:    Date:  03/11/2023   ID:  Paula Wong, DOB [birth year], MRN [id number]  PCP:  Loura Back, NP   [facility] Providers Cardiologist:  Lavon Bothwell  EP:  Lalla Brothers   Referring MD: Loura Back, NP   Chief Complaint  Patient presents with   Coronary Artery Disease   Congestive Heart Failure         History of Present Illness:    Paula Wong is a 77 y.o. female with a hx of CVA , CHF I met her during a recent hospitalization for her stroke   Coronary artery calcium score is 2553 RCA - mild - mod  CAD LM - moderate CAD LAD - mod disease in 2 large diags,  severe disease in mid vessel and distal vessel  Ramus :  proximal severe disease  LCx: moderate CAD  Cath on Aug. 29, 2023:   mod - severe CAD . Her LV dysfunction seemed out of proportion to her degree of CAD .  If she develops symptoms of CP, can consider shockwave therapy or CSI atherectomy    Jan. 19, 2024  Paula Wong is seen today for follow up of her hx of CVA, CHF Has moderate CAD.  Her LV dysfunction seems to be out of proportion to her degree of her CAD .  Echocardiogram in August, 2023 reveals EF of 20 to 25%.  Current medications include  Entresto 24-26 twice a day Metoprolol succinate 50 mg twice a day Farxiga 10 mg a day Aldactone 12.5 mg a day  HR and BP are well controlled.  Not getting any exercise    December 10, 2022 Seen for follow-up of her coronary artery disease and CVA.  She is here for preoperative visit.  She has a sebaceous cyst on her left side of her neck.  She is on plavix   She is at low risk for her upcoming cyst  removal.  She needs clearance to stop plavix She may hold her Plavix for 5 days prior to procedure.  She should restart plavix as soon as it is safe from a surgical standpoint    Jan 06, 2023 Paula Wong is seen for follow up of her CHF, CAD She was admmitted to the hospital in Dec 30, 2022 with sustained VT She had no apparent symptoms  but the VT was picked up on her implantable loop recorder.    HR as high as 180, polymorphic ,  lasted 23 seconds, spontaneously terminated  Cath showed a tight mid LAD stenosis which was successfully stented ( introcoronary lithotripsy and DES)   Echocardiogram from December 06, 2022 reveals severe LV dysfunction with EF of 25 to 30%.  More short of breath than usual   Avoid salty foods  Having difficulty sleeping . No CP   She is very short of breath today O2 sats range from 77-88 while sitting in the wheelchair  She was not this short of breath when she was discharged. Started 2-3 days later   No CP   No pleuretic cp  Few basilar rales  Is not on lasix   March 11, 2023 Paula Wong is seen for follow up of her CHF, ventricular tachycardia,  Dyspnea   She was admitted May 13 with NSVT She was found to have severe LAD stenosis and had stenting of her LAD   I saw her after DC and was again readmitted for hypoxemia with O2 sats from 77-88%  EF is 25-30%  She was  seen by Dr. Gasper Lloyd in the Advanced CHF clinic on March 06, 2023.  She was feeling better at that visit  Dr. Gasper Lloyd has scheduled a cardiac MRI, Sherryll Burger was increased Also on metoprolol Spironolactone 25  Farxiga 10  She is feeling much better than when I last saw her in May .  No CP  No tachycardia   She will be seeing the CHF clinic regularly  I will see her again in a year     Past Medical History:  Diagnosis Date   Allergy    CAD (coronary artery disease)    a. 03/2022 Cath: LM 40ost, 67m, LAD 40ost/p, 64m, 71m, LCX 31m, RCA nl, RV branch 70-->Med rx.   Chronic HFrEF (heart failure with reduced ejection fraction) (HCC)    a. 11/2021 Echo: EF 20-25%; b. 03/2022 Echo: EF 20-25%; c. 11/2022 Echo: EF 25-30%, glob HK.   Diabetes (HCC)    Diverticulosis    GERD (gastroesophageal reflux disease)    Hiatal hernia    Hyperlipidemia    Hypertension    Implantable loop recorder present    a. 03/2022 s/p Abbott  IQ EL+ (serial [id number]) ILR in setting of cryptogenic stroke.   Ischemic cardiomyopathy    a. 11/2021 Echo: EF 20-25%; b. 03/2022 Echo: EF 20-25%; c. 11/2022 Echo: EF 25-30%, glob HK, nl RV fxn, mild-mod MR, mild AI, AoV sclerosis.   Neuropathy    feet   Schatzki's ring    Stroke [facility])    a. 03/2022 MRI Brain: subacute cortical infarct involving inf and med L occipial pole w/ petechial hemorrhage; b. 03/2022 s/p Abbott Assert IQ EL ILR.   Ventricular tachycardia (HCC)    a. 12/2022 noted on ILR.    Past Surgical History:  Procedure Laterality Date   CORONARY LITHOTRIPSY N/A 12/31/2022   Procedure: CORONARY LITHOTRIPSY;  Surgeon: Swaziland, Peter M, MD;  Location: [facility] INVASIVE CV LAB;  Service: Cardiovascular;  Laterality: N/A;   CORONARY STENT INTERVENTION N/A 12/31/2022   Procedure: CORONARY STENT INTERVENTION;  Surgeon: Swaziland, Peter M, MD;  Location: [facility] INVASIVE CV LAB;  Service: Cardiovascular;  Laterality: N/A;   CORONARY ULTRASOUND/IVUS N/A 12/31/2022   Procedure: Coronary Ultrasound/IVUS;  Surgeon: Swaziland, Peter M, MD;  Location: [facility] INVASIVE CV LAB;  Service: Cardiovascular;  Laterality: N/A;   LEFT HEART CATH AND CORONARY ANGIOGRAPHY N/A 04/16/2022   Procedure: LEFT HEART CATH AND CORONARY ANGIOGRAPHY;  Surgeon: Lennette Bihari, MD;  Location: [facility] INVASIVE CV LAB;  Service: Cardiovascular;  Laterality: N/A;   LEFT HEART CATH AND CORONARY ANGIOGRAPHY N/A 12/31/2022   Procedure: LEFT HEART CATH AND CORONARY ANGIOGRAPHY;  Surgeon: Swaziland, Peter M, MD;  Location: [facility] INVASIVE CV LAB;  Service: Cardiovascular;  Laterality: N/A;   LOOP RECORDER INSERTION N/A 04/15/2022   Procedure: LOOP RECORDER INSERTION;  Surgeon: Lanier Prude, MD;  Location: [facility] INVASIVE CV LAB;  Service: Cardiovascular;  Laterality: N/A;   MASS EXCISION Right 06/07/2019   Procedure: Dawson Bills OF KELOID TO RIGHT NECK;  Surgeon: Louisa Second, MD;  Location: [facility];  Service: Plastics;  Laterality: Right;    TOOTH EXTRACTION  2013    Current Medications: Current Meds  Medication Sig   aspirin 81 MG chewable tablet Chew 1 tablet (81 mg total) by mouth daily.   atorvastatin (LIPITOR) 80 MG tablet Take 80 mg by mouth at bedtime.   clopidogrel (PLAVIX) 75 MG tablet Take 1 tablet (75 mg total) by mouth daily.   FARXIGA 10 MG TABS  tablet Take 10 mg by mouth daily.   furosemide (LASIX) 20 MG tablet Take 1 tablet (20 mg total) by mouth daily. May take extra 20 mg as needed for fluid   gabapentin (NEURONTIN) 600 MG tablet Take 600 mg by mouth 2 (two) times daily.   magnesium oxide (MAG-OX) 400 MG tablet Take 400 mg by mouth daily.   metFORMIN (GLUCOPHAGE-XR) 500 MG 24 hr tablet Take 500 mg by mouth daily with breakfast.   metoprolol succinate (TOPROL-XL) 50 MG 24 hr tablet Take 1 tablet (50 mg total) by mouth daily.   Multiple Vitamin (MULTIVITAMIN) tablet Take 1 tablet by mouth daily.   nitroGLYCERIN (NITROSTAT) 0.4 MG SL tablet Place 1 tablet (0.4 mg total) under the tongue every 5 (five) minutes x 3 doses as needed for chest pain.   ONETOUCH ULTRA test strip USE TO CHECK BLOOD SUGARS ONCE DAILY AND AS NEEDED   OZEMPIC, 1 MG/DOSE, 4 MG/3ML SOPN Inject 1 mg into the skin once a week.   pantoprazole (PROTONIX) 20 MG tablet Take 1 tablet (20 mg total) by mouth daily.   sacubitril-valsartan (ENTRESTO) 97-103 MG Take 1 tablet by mouth 2 (two) times daily.   spironolactone (ALDACTONE) 25 MG tablet Take 1 tablet (25 mg total) by mouth daily.   TRESIBA FLEXTOUCH 200 UNIT/ML SOPN Take 80 Units by mouth at bedtime.      Allergies:   Patient has no known allergies.   Social History   Socioeconomic History   Marital status: Single    Spouse name: Not on file   Number of children: 2   Years of education: Not on file   Highest education level: High school graduate  Occupational History   Occupation: Retired  Tobacco Use   Smoking status: Former    Current packs/day: 0.00    Types: Cigarettes    Quit  date: 08/20/2003    Years since quitting: 19.5   Smokeless tobacco: Never  Vaping Use   Vaping status: Never Used  Substance and Sexual Activity   Alcohol use: No   Drug use: No   Sexual activity: Not on file  Other Topics Concern   Not on file  Social History Narrative   Not on file   Social Determinants of Health   Financial Resource Strain: Low Risk  (01/07/2023)   Overall Financial Resource Strain (CARDIA)    Difficulty of Paying Living Expenses: Not hard at all  Food Insecurity: No Food Insecurity (01/07/2023)   Hunger Vital Sign    Worried About Running Out of Food in the Last Year: Never true    Ran Out of Food in the Last Year: Never true  Transportation Needs: No Transportation Needs (01/07/2023)   PRAPARE - Administrator, Civil Service (Medical): No    Lack of Transportation (Non-Medical): No  Physical Activity: Not on file  Stress: Not on file  Social Connections: Not on file     Family History: The patient's family history includes Cancer in her sister; Colon cancer (age of onset: 61) in her mother; Heart attack in her brother and father. There is no history of Stomach cancer, Pancreatic cancer, or Esophageal cancer.  ROS:   Please see the history of present illness.     All other systems reviewed and are negative.  EKGs/Labs/Other Studies Reviewed:    The following studies were reviewed today:    Recent Labs: 01/01/2023: ALT 12; TSH 3.959 01/08/2023: Magnesium 1.9; Platelets 349 03/06/2023: B Natriuretic Peptide 520.0;  BUN 8; Creatinine, Ser 1.14; Hemoglobin 15.2; Potassium 3.8; Sodium 142  Recent Lipid Panel    Component Value Date/Time   CHOL 100 12/31/2022 0053   TRIG 101 12/31/2022 0053   HDL 39 (L) 12/31/2022 0053   CHOLHDL 2.6 12/31/2022 0053   VLDL 20 12/31/2022 0053   LDLCALC 41 12/31/2022 0053   LDLCALC 90 10/02/2020 1100     Risk Assessment/Calculations:      Physical Exam: Blood pressure 112/74, pulse 79, height 5\' 5"   (1.651 m), weight 182 lb 3.2 oz (82.6 kg), SpO2 95%.       GEN:  elderly female  in no acute distress HEENT: Normal NECK: No JVD; No carotid bruits LYMPHATICS: No lymphadenopathy CARDIAC: RRR , no murmurs, rubs, gallops RESPIRATORY:  Clear to auscultation without rales, wheezing or rhonchi  ABDOMEN: Soft, non-tender, non-distended MUSCULOSKELETAL:  No edema; No deformity  SKIN: Warm and dry NEUROLOGIC:  Alert and oriented x 3    EKG:    ASSESSMENT:    1. Acute on chronic HFrEF (heart failure with reduced ejection fraction) (HCC)   2. Coronary artery disease involving native coronary artery of native heart with other form of angina pectoris (HCC)   3. Ventricular tachycardia (HCC)       PLAN:      Ventricular tachycardia: She was diagnosed as having ventricular tachycardia on an implantable loop.  She was called last week and was admitted to the hospital.  Workup included a heart catheterization with subsequent stenting of her left anterior descending artery.      2.  Chronic combined CHF:    is now followed by the Advanced CHF team ( [facility])  will defer to the CHF team .  3.   CAD :  no angina .  Cont meds         Medication Adjustments/Labs and Tests Ordered: Current medicines are reviewed at length with the patient today.  Concerns regarding medicines are outlined above.  No orders of the defined types were placed in this encounter.  No orders of the defined types were placed in this encounter.    Patient Instructions  Medication Instructions:  Your physician recommends that you continue on your current medications as directed. Please refer to the Current Medication list given to you today.  *If you need a refill on your cardiac medications before your next appointment, please call your pharmacy*   Lab Work: NONE If you have labs (blood work) drawn today and your tests are completely normal, you will receive your results only by: MyChart Message (if  you have MyChart) OR A paper copy in the mail If you have any lab test that is abnormal or we need to change your treatment, we will call you to review the results.   Testing/Procedures: NONE   Follow-Up: At [facility], you and your health needs are our priority.  As part of our continuing mission to provide you with exceptional heart care, we have created designated Provider Care Teams.  These Care Teams include your primary Cardiologist (physician) and Advanced Practice Providers (APPs -  Physician Assistants and Nurse Practitioners) who all work together to provide you with the care you need, when you need it.  We recommend signing up for the patient portal called "MyChart".  Sign up information is provided on this After Visit Summary.  MyChart is used to connect with patients for Virtual Visits (Telemedicine).  Patients are able to view lab/test results, encounter notes, upcoming appointments, etc.  Non-urgent messages can be sent to your provider as well.   To learn more about what you can do with MyChart, go to [email or web address].    Your next appointment:   1 year(s)  Provider:   Kristeen Miss, MD        Signed, Kristeen Miss, MD  03/11/2023 11:38 AM    [facility]

## 2023-03-11 ENCOUNTER — Encounter: Payer: Self-pay | Admitting: Cardiovascular Disease

## 2023-03-11 ENCOUNTER — Ambulatory Visit: Payer: 59 | Admitting: Cardiovascular Disease

## 2023-03-11 VITALS — BP 112/74 | HR 79 | Ht 65.0 in | Wt 182.2 lb

## 2023-03-11 DIAGNOSIS — I25118 Atherosclerotic heart disease of native coronary artery with other forms of angina pectoris: Secondary | ICD-10-CM

## 2023-03-11 DIAGNOSIS — I472 Ventricular tachycardia, unspecified: Secondary | ICD-10-CM

## 2023-03-11 DIAGNOSIS — I5023 Acute on chronic systolic (congestive) heart failure: Secondary | ICD-10-CM | POA: Diagnosis not present

## 2023-03-11 NOTE — Patient Instructions (Signed)

## 2023-03-16 ENCOUNTER — Ambulatory Visit (HOSPITAL_COMMUNITY)
Admission: EM | Admit: 2023-03-16 | Discharge: 2023-03-16 | Disposition: A | Discharge status: Home / Self Care | Payer: 59 | Attending: Internal Medicine | Admitting: Internal Medicine

## 2023-03-16 ENCOUNTER — Encounter (HOSPITAL_COMMUNITY): Payer: Self-pay

## 2023-03-16 ENCOUNTER — Ambulatory Visit (INDEPENDENT_AMBULATORY_CARE_PROVIDER_SITE_OTHER): Payer: 59

## 2023-03-16 DIAGNOSIS — S82832A Other fracture of upper and lower end of left fibula, initial encounter for closed fracture: Secondary | ICD-10-CM | POA: Diagnosis not present

## 2023-03-16 DIAGNOSIS — S8252XA Displaced fracture of medial malleolus of left tibia, initial encounter for closed fracture: Secondary | ICD-10-CM

## 2023-03-16 NOTE — Progress Notes (Signed)
Orthopedic Tech Progress Note Patient Details:  Paula Wong 1945/09/11 [id number]  Ortho Devices Type of Ortho Device: Crutches, Short leg splint, Stirrup splint Ortho Device/Splint Interventions: Ordered, Application, Adjustment   Post Interventions Patient Tolerated: Well, Difficulty with ambulation Instructions Provided: Care of device Expressed concerns over patient using crutches due to her age however the provider insisted that she use them because there was nothing else that could be given to her. I provided crutch training for the patient however she stated she did not feel comfortable with using the crutches and neither did I. I notified the ortho tech supervisor of the situation as I felt letting he leave with crutches was unsafe. RN notified that the patient did not feel comfortable with using the crutches and she would prefer a walker instead.  Darleen Crocker 03/16/2023, 6:46 PM

## 2023-03-16 NOTE — ED Triage Notes (Signed)
Patient reports that she tripped over a cord today and fell. Patient c/o left ankle pain. Patient has swelling and pain present.  Patient denies taking anything for her pain

## 2023-03-16 NOTE — ED Provider Notes (Signed)
[facility]    CSN: [id number] Arrival date & time: 03/16/23  1601      History   Chief Complaint Chief Complaint  Patient presents with   Fall   Ankle Injury    HPI Paula Wong is a 77 y.o. female.   HPI  Past Medical History:  Diagnosis Date   Allergy    CAD (coronary artery disease)    a. 03/2022 Cath: LM 40ost, 29m, LAD 40ost/p, 43m, 64m, LCX 61m, RCA nl, RV branch 70-->Med rx.   Chronic HFrEF (heart failure with reduced ejection fraction) (HCC)    a. 11/2021 Echo: EF 20-25%; b. 03/2022 Echo: EF 20-25%; c. 11/2022 Echo: EF 25-30%, glob HK.   Diabetes (HCC)    Diverticulosis    GERD (gastroesophageal reflux disease)    Hiatal hernia    Hyperlipidemia    Hypertension    Implantable loop recorder present    a. 03/2022 s/p Abbott IQ EL+ (serial [id number]) ILR in setting of cryptogenic stroke.   Ischemic cardiomyopathy    a. 11/2021 Echo: EF 20-25%; b. 03/2022 Echo: EF 20-25%; c. 11/2022 Echo: EF 25-30%, glob HK, nl RV fxn, mild-mod MR, mild AI, AoV sclerosis.   Neuropathy    feet   Schatzki's ring    Stroke [facility])    a. 03/2022 MRI Brain: subacute cortical infarct involving inf and med L occipial pole w/ petechial hemorrhage; b. 03/2022 s/p Abbott Assert IQ EL ILR.   Ventricular tachycardia (HCC)    a. 12/2022 noted on ILR.    Patient Active Problem List   Diagnosis Date Noted   Acute on chronic HFrEF (heart failure with reduced ejection fraction) (HCC) 01/06/2023   Chronic HFrEF (heart failure with reduced ejection fraction) (HCC) 01/02/2023   Mitral regurgitation 01/02/2023   CKD (chronic kidney disease) stage 2, GFR 60-89 ml/min 01/02/2023   CAD (coronary artery disease) 01/02/2023   Ventricular tachycardia (HCC) 12/30/2022   Ischemic stroke (HCC) 04/16/2022   Abnormal cardiac CT angiography    Hypertension    Diabetes (HCC)    Hyperlipidemia    Acute CVA (cerebrovascular accident) (HCC) 04/12/2022   Keloid 05/18/2019   Family history of colon  cancer in mother 05/05/2014   GERD (gastroesophageal reflux disease) 05/05/2014    Past Surgical History:  Procedure Laterality Date   CORONARY LITHOTRIPSY N/A 12/31/2022   Procedure: CORONARY LITHOTRIPSY;  Surgeon: Swaziland, Peter M, MD;  Location: [facility] INVASIVE CV LAB;  Service: Cardiovascular;  Laterality: N/A;   CORONARY STENT INTERVENTION N/A 12/31/2022   Procedure: CORONARY STENT INTERVENTION;  Surgeon: Swaziland, Peter M, MD;  Location: [facility] INVASIVE CV LAB;  Service: Cardiovascular;  Laterality: N/A;   CORONARY ULTRASOUND/IVUS N/A 12/31/2022   Procedure: Coronary Ultrasound/IVUS;  Surgeon: Swaziland, Peter M, MD;  Location: [facility] INVASIVE CV LAB;  Service: Cardiovascular;  Laterality: N/A;   LEFT HEART CATH AND CORONARY ANGIOGRAPHY N/A 04/16/2022   Procedure: LEFT HEART CATH AND CORONARY ANGIOGRAPHY;  Surgeon: Lennette Bihari, MD;  Location: [facility] INVASIVE CV LAB;  Service: Cardiovascular;  Laterality: N/A;   LEFT HEART CATH AND CORONARY ANGIOGRAPHY N/A 12/31/2022   Procedure: LEFT HEART CATH AND CORONARY ANGIOGRAPHY;  Surgeon: Swaziland, Peter M, MD;  Location: [facility] INVASIVE CV LAB;  Service: Cardiovascular;  Laterality: N/A;   LOOP RECORDER INSERTION N/A 04/15/2022   Procedure: LOOP RECORDER INSERTION;  Surgeon: Lanier Prude, MD;  Location: [facility] INVASIVE CV LAB;  Service: Cardiovascular;  Laterality: N/A;   MASS EXCISION Right 06/07/2019   Procedure:  EXCISIO OF KELOID TO RIGHT NECK;  Surgeon: Louisa Second, MD;  Location: [facility];  Service: Plastics;  Laterality: Right;   TOOTH EXTRACTION  2013    OB History   No obstetric history on file.      Home Medications    Prior to Admission medications   Medication Sig Start Date End Date Taking? Authorizing Provider  aspirin 81 MG chewable tablet Chew 1 tablet (81 mg total) by mouth daily. 01/03/23   Abagail Kitchens, PA-C  atorvastatin (LIPITOR) 80 MG tablet Take 80 mg by mouth at bedtime. 04/18/22   [provider]   clopidogrel (PLAVIX) 75 MG tablet Take 1 tablet (75 mg total) by mouth daily. 01/02/23   Yvonna Alanis L, PA-C  FARXIGA 10 MG TABS tablet Take 10 mg by mouth daily. 03/14/22   [provider]  furosemide (LASIX) 20 MG tablet Take 1 tablet (20 mg total) by mouth daily. May take extra 20 mg as needed for fluid 01/24/23   Andrey Farmer, PA-C  gabapentin (NEURONTIN) 600 MG tablet Take 600 mg by mouth 2 (two) times daily. 08/17/22   [provider]  magnesium oxide (MAG-OX) 400 MG tablet Take 400 mg by mouth daily.    [provider]  metFORMIN (GLUCOPHAGE-XR) 500 MG 24 hr tablet Take 500 mg by mouth daily with breakfast. 03/14/22   [provider]  metoprolol succinate (TOPROL-XL) 50 MG 24 hr tablet Take 1 tablet (50 mg total) by mouth daily. 01/24/23   Andrey Farmer, PA-C  Multiple Vitamin (MULTIVITAMIN) tablet Take 1 tablet by mouth daily.    [provider]  nitroGLYCERIN (NITROSTAT) 0.4 MG SL tablet Place 1 tablet (0.4 mg total) under the tongue every 5 (five) minutes x 3 doses as needed for chest pain. 01/02/23   Abagail Kitchens, PA-C  ONETOUCH ULTRA test strip USE TO CHECK BLOOD SUGARS ONCE DAILY AND AS NEEDED 01/24/23   [provider]  OZEMPIC, 1 MG/DOSE, 4 MG/3ML SOPN Inject 1 mg into the skin once a week. 12/01/22   [provider]  pantoprazole (PROTONIX) 20 MG tablet Take 1 tablet (20 mg total) by mouth daily. 09/10/22   Pyrtle, Carie Caddy, MD  sacubitril-valsartan (ENTRESTO) 97-103 MG Take 1 tablet by mouth 2 (two) times daily. 03/06/23   Sabharwal, Aditya, DO  spironolactone (ALDACTONE) 25 MG tablet Take 1 tablet (25 mg total) by mouth daily. 01/24/23   Andrey Farmer, PA-C  TRESIBA FLEXTOUCH 200 UNIT/ML SOPN Take 80 Units by mouth at bedtime.  10/12/18   [provider]    Family History Family History  Problem Relation Age of Onset   Colon cancer Mother 56   Heart attack Father    Cancer Sister        "female  cancer"   Heart attack Brother    Stomach cancer Neg Hx    Pancreatic cancer Neg Hx    Esophageal cancer Neg Hx     Social History Social History   Tobacco Use   Smoking status: Former    Current packs/day: 0.00    Types: Cigarettes    Quit date: 08/20/2003    Years since quitting: 19.5   Smokeless tobacco: Never  Vaping Use   Vaping status: Never Used  Substance Use Topics   Alcohol use: No   Drug use: No     Allergies   Patient has no known allergies.   Review of Systems Review of Systems  Constitutional:  Positive for activity change. Negative for appetite change, fatigue and fever.  Respiratory:  Negative for cough and shortness of breath.   Cardiovascular:  Positive for leg swelling. Negative for chest pain and palpitations.  Musculoskeletal:  Positive for arthralgias and joint swelling. Negative for myalgias.  Neurological:  Negative for weakness and numbness.     Physical Exam Triage Vital Signs ED Triage Vitals  Encounter Vitals Group     BP 03/16/23 1712 (!) 149/84     Systolic BP Percentile --      Diastolic BP Percentile --      Pulse Rate 03/16/23 1712 90     Resp 03/16/23 1712 16     Temp 03/16/23 1712 97.8 F (36.6 C)     Temp Source 03/16/23 1712 Oral     SpO2 03/16/23 1712 95 %     Weight --      Height --      Head Circumference --      Peak Flow --      Pain Score 03/16/23 1714 7     Pain Loc --      Pain Education --      Exclude from Growth Chart --    No data found.  Updated Vital Signs BP (!) 149/84 (BP Location: Right Arm)   Pulse 90   Temp 97.8 F (36.6 C) (Oral)   Resp 16   SpO2 95%   Visual Acuity Right Eye Distance:   Left Eye Distance:   Bilateral Distance:    Right Eye Near:   Left Eye Near:    Bilateral Near:     Physical Exam Vitals reviewed.  Constitutional:      General: She is awake. She is not in acute distress.    Appearance: Normal appearance. She is well-developed. She is not ill-appearing.      Comments: Very pleasant female appears stated age in no acute distress sitting comfortably in exam room  HENT:     Head: Normocephalic and atraumatic.  Cardiovascular:     Rate and Rhythm: Normal rate and regular rhythm.     Heart sounds: Normal heart sounds, S1 normal and S2 normal. No murmur heard.    Comments: Capillary refill within 2 seconds Pulmonary:     Effort: Pulmonary effort is normal.     Breath sounds: Normal breath sounds. No wheezing, rhonchi or rales.     Comments: Clear to auscultation bilaterally Musculoskeletal:     Left ankle: Swelling present. Tenderness present over the lateral malleolus. Normal range of motion. Anterior drawer test negative.     Comments: Left ankle/foot: Swelling and mild/palpation over lateral malleolus.  No deformity noted.  Foot neurovascularly intact.  Psychiatric:        Behavior: Behavior is cooperative.      UC Treatments / Results  Labs (all labs ordered are listed, but only abnormal results are displayed) Labs Reviewed - No data to display  EKG   Radiology DG Ankle Complete Left  Result Date: 03/16/2023 CLINICAL DATA:  Ankle injury with pain EXAM: LEFT ANKLE COMPLETE - 3+ VIEW COMPARISON:  None Available. FINDINGS: Copious soft tissue swelling. Acute avulsion injury off the tip of the medial malleolus. Acute oblique fracture distal shaft and metaphysis of fibula with less than 1/4 shaft lateral displacement of distal fracture fragment. Mortise is symmetric IMPRESSION: 1. Acute avulsion injury off the tip of the medial malleolus. 2. Acute minimally displaced distal fibular fracture Electronically Signed   By:  Jasmine Pang M.D.   On: 03/16/2023 17:35    Procedures Procedures (including critical care time)  Medications Ordered in UC Medications - No data to display  Initial Impression / Assessment and Plan / UC Course  I have reviewed the triage vital signs and the nursing notes.  Pertinent labs & imaging results that were  available during my care of the patient were reviewed by me and considered in my medical decision making (see chart for details).     Patient is well-appearing, afebrile, nontoxic, nontachycardic.  Physical exam is reassuring and her foot is neurovascularly intact.  X-ray was obtained that showed acute avulsion of the medial malleolus as well as minimally displaced distal fibular fracture.  Patient is not having significant pain and has cardiovascular structures so she was instructed to take Tylenol as needed for pain relief.  She was placed in a posterior stirrup splint and given crutches.  Will have her minimize weightbearing but we discussed that is important she does not fall and so it is okay to put some weight on this foot.  She is to keep it elevated is much as possible.  Discussed that she will need to see orthopedics and was given contact information for local provider with instruction to call to schedule an appointment first thing tomorrow.  We discussed that if she has any worsening or changing symptoms including increasing pain, numbness or paresthesias, cold sensation in the foot, additional swelling she needs to be seen emergently.  Strict return precautions given.    Final Clinical Impressions(s) / UC Diagnoses   Final diagnoses:  Closed fracture of distal end of left fibula, unspecified fracture morphology, initial encounter  Closed avulsion fracture of medial malleolus of left tibia, initial encounter     Discharge Instructions      You have a broken ankle.  We have placed you in a splint for comfort and support.  Try to use crutches to avoid putting weight on this ankle.  Keep it elevated when you are at home.  Take Tylenol for pain.  Please call and schedule an appointment with orthopedics first thing tomorrow.  If you have any worsening symptoms including increasing pain, numbness or tingling in the foot, cold sensation in the foot, difficulty putting weight on it you need to go  to the emergency room.     ED Prescriptions   None    I have reviewed the PDMP during this encounter.   Jeani Hawking, PA-C 03/16/23 1749

## 2023-03-16 NOTE — ED Notes (Signed)
Ortho called and notified of treatment ordered.

## 2023-03-16 NOTE — Discharge Instructions (Signed)
You have a broken ankle.  We have placed you in a splint for comfort and support.  Try to use crutches to avoid putting weight on this ankle.  Keep it elevated when you are at home.  Take Tylenol for pain.  Please call and schedule an appointment with orthopedics first thing tomorrow.  If you have any worsening symptoms including increasing pain, numbness or tingling in the foot, cold sensation in the foot, difficulty putting weight on it you need to go to the emergency room.

## 2023-03-18 NOTE — Progress Notes (Signed)
Carelink Summary Report / Loop Recorder 

## 2023-03-26 ENCOUNTER — Telehealth (HOSPITAL_COMMUNITY): Payer: Self-pay

## 2023-03-26 NOTE — Telephone Encounter (Signed)
Called pt to schedule orientation. Pt stated she is in a boot and has chipped her foot and ankle. She has a follow up on 8/13@11 :15

## 2023-03-31 ENCOUNTER — Ambulatory Visit (INDEPENDENT_AMBULATORY_CARE_PROVIDER_SITE_OTHER): Payer: 59

## 2023-03-31 DIAGNOSIS — I472 Ventricular tachycardia, unspecified: Secondary | ICD-10-CM

## 2023-03-31 LAB — CUP PACEART REMOTE DEVICE CHECK
Date Time Interrogation Session: 20240810020118
Implantable Pulse Generator Implant Date: 20230828
Pulse Gen Serial Number: 511012456

## 2023-04-14 NOTE — Progress Notes (Signed)
[employer] Loop Recorder  

## 2023-04-29 ENCOUNTER — Ambulatory Visit (INDEPENDENT_AMBULATORY_CARE_PROVIDER_SITE_OTHER): Payer: 59 | Admitting: Podiatry

## 2023-04-29 ENCOUNTER — Encounter: Payer: Self-pay | Admitting: Podiatry

## 2023-04-29 DIAGNOSIS — E1169 Type 2 diabetes mellitus with other specified complication: Secondary | ICD-10-CM | POA: Diagnosis not present

## 2023-04-29 DIAGNOSIS — M79609 Pain in unspecified limb: Secondary | ICD-10-CM

## 2023-04-29 DIAGNOSIS — E119 Type 2 diabetes mellitus without complications: Secondary | ICD-10-CM | POA: Diagnosis not present

## 2023-04-29 DIAGNOSIS — B351 Tinea unguium: Secondary | ICD-10-CM | POA: Diagnosis not present

## 2023-04-29 NOTE — Progress Notes (Signed)
  Subjective:  Patient ID: Paula Wong, female    DOB: [birth year],   MRN: [id number]  No chief complaint on file.   77 y.o. female presents concern of thickened elongated and painful nails that are difficult to trim. Requesting to have them trimmed today. Relates burning and tingling in their feet. Patient is diabetic and last A1c was  Lab Results  Component Value Date   HGBA1C 6.2 (H) 03/06/2023   .  Does relates recent fracture of her foot and ankle after tripping over an extension cord. Relates she just got out of the boot and doing well.   PCP:  Loura Back, NP     PCP:  Loura Back, NP    . Denies any other pedal complaints. Denies n/v/f/c.   Past Medical History:  Diagnosis Date   Allergy    CAD (coronary artery disease)    a. 03/2022 Cath: LM 40ost, 76m, LAD 40ost/p, 2m, 75m, LCX 10m, RCA nl, RV branch 70-->Med rx.   Chronic HFrEF (heart failure with reduced ejection fraction) (HCC)    a. 11/2021 Echo: EF 20-25%; b. 03/2022 Echo: EF 20-25%; c. 11/2022 Echo: EF 25-30%, glob HK.   Diabetes (HCC)    Diverticulosis    GERD (gastroesophageal reflux disease)    Hiatal hernia    Hyperlipidemia    Hypertension    Implantable loop recorder present    a. 03/2022 s/p Abbott IQ EL+ (serial [id number]) ILR in setting of cryptogenic stroke.   Ischemic cardiomyopathy    a. 11/2021 Echo: EF 20-25%; b. 03/2022 Echo: EF 20-25%; c. 11/2022 Echo: EF 25-30%, glob HK, nl RV fxn, mild-mod MR, mild AI, AoV sclerosis.   Neuropathy    feet   Schatzki's ring    Stroke [facility])    a. 03/2022 MRI Brain: subacute cortical infarct involving inf and med L occipial pole w/ petechial hemorrhage; b. 03/2022 s/p Abbott Assert IQ EL ILR.   Ventricular tachycardia (HCC)    a. 12/2022 noted on ILR.    Objective:  Physical Exam: Vascular: DP/PT pulses 2/4 bilateral. CFT <3 seconds. Absent hair growth on digits. Edema noted to bilateral lower extremities. Xerosis noted bilaterally.  Skin. No lacerations or  abrasions bilateral feet. Nails 1-5 bilateral  are thickened discolored and elongated with subungual debris.  Musculoskeletal: MMT 5/5 bilateral lower extremities in DF, PF, Inversion and Eversion. Deceased ROM in DF of ankle joint. Mild tenderness and edema noted along lateral malleolus and fibula.  Neurological: Sensation intact to light touch. Protective sensation intact bilateral.    Assessment:   1. Pain due to onychomycosis of nail   2. Type 2 diabetes mellitus with other specified complication, without long-term current use of insulin (HCC)       Plan:  Patient was evaluated and treated and all questions answered. -Discussed and educated patient on diabetic foot care, especially with  regards to the vascular, neurological and musculoskeletal systems.  -Stressed the importance of good glycemic control and the detriment of not  controlling glucose levels in relation to the foot. -Discussed supportive shoes at all times and checking feet regularly.  -Mechanically debrided all nails 1-5 bilateral using sterile nail nipper and filed with dremel without incident  -Answered all patient questions -Patient to return  in 3 months for at risk foot care -Patient advised to call the office if any problems or questions arise in the meantime.    Louann Sjogren, DPM

## 2023-05-01 ENCOUNTER — Ambulatory Visit (INDEPENDENT_AMBULATORY_CARE_PROVIDER_SITE_OTHER): Payer: 59

## 2023-05-01 DIAGNOSIS — I472 Ventricular tachycardia, unspecified: Secondary | ICD-10-CM

## 2023-05-01 LAB — CUP PACEART REMOTE DEVICE CHECK
Date Time Interrogation Session: 20240910020128
Pulse Gen Serial Number: 511012456

## 2023-05-09 ENCOUNTER — Encounter (HOSPITAL_COMMUNITY): Payer: Self-pay | Admitting: Cardiology

## 2023-05-09 ENCOUNTER — Ambulatory Visit (HOSPITAL_COMMUNITY)
Admission: RE | Admit: 2023-05-09 | Discharge: 2023-05-09 | Disposition: A | Discharge status: Home / Self Care | Payer: 59 | Source: Ambulatory Visit | Attending: Cardiology | Admitting: Cardiology

## 2023-05-09 VITALS — BP 110/80 | HR 94 | Wt 184.0 lb

## 2023-05-09 DIAGNOSIS — Z8673 Personal history of transient ischemic attack (TIA), and cerebral infarction without residual deficits: Secondary | ICD-10-CM | POA: Diagnosis not present

## 2023-05-09 DIAGNOSIS — I472 Ventricular tachycardia, unspecified: Secondary | ICD-10-CM

## 2023-05-09 DIAGNOSIS — R9431 Abnormal electrocardiogram [ECG] [EKG]: Secondary | ICD-10-CM | POA: Diagnosis not present

## 2023-05-09 DIAGNOSIS — I5022 Chronic systolic (congestive) heart failure: Secondary | ICD-10-CM

## 2023-05-09 DIAGNOSIS — I110 Hypertensive heart disease with heart failure: Secondary | ICD-10-CM | POA: Insufficient documentation

## 2023-05-09 DIAGNOSIS — I2510 Atherosclerotic heart disease of native coronary artery without angina pectoris: Secondary | ICD-10-CM | POA: Diagnosis not present

## 2023-05-09 DIAGNOSIS — E785 Hyperlipidemia, unspecified: Secondary | ICD-10-CM

## 2023-05-09 DIAGNOSIS — I25118 Atherosclerotic heart disease of native coronary artery with other forms of angina pectoris: Secondary | ICD-10-CM

## 2023-05-09 DIAGNOSIS — Z955 Presence of coronary angioplasty implant and graft: Secondary | ICD-10-CM | POA: Diagnosis not present

## 2023-05-09 DIAGNOSIS — Z7902 Long term (current) use of antithrombotics/antiplatelets: Secondary | ICD-10-CM | POA: Diagnosis not present

## 2023-05-09 LAB — BASIC METABOLIC PANEL
Anion gap: 13 (ref 5–15)
BUN: 8 mg/dL (ref 8–23)
CO2: 24 mmol/L (ref 22–32)
Calcium: 8.4 mg/dL — ABNORMAL LOW (ref 8.9–10.3)
Chloride: 104 mmol/L (ref 98–111)
Creatinine, Ser: 1.04 mg/dL — ABNORMAL HIGH (ref 0.44–1.00)
GFR, Estimated: 55 mL/min — ABNORMAL LOW (ref >60)
Glucose, Bld: 154 mg/dL — ABNORMAL HIGH (ref 70–99)
Potassium: 3.5 mmol/L (ref 3.5–5.1)
Sodium: 141 mmol/L (ref 135–145)

## 2023-05-09 LAB — BRAIN NATRIURETIC PEPTIDE: B Natriuretic Peptide: 272.8 pg/mL — ABNORMAL HIGH (ref 0.0–100.0)

## 2023-05-09 LAB — LIPID PANEL
Cholesterol: 125 mg/dL (ref 0–200)
HDL: 43 mg/dL (ref >40)
LDL Cholesterol: 60 mg/dL (ref 0–99)
Total CHOL/HDL Ratio: 2.9 RATIO
Triglycerides: 109 mg/dL (ref <150)
VLDL: 22 mg/dL (ref 0–40)

## 2023-05-09 MED ORDER — METOPROLOL SUCCINATE ER 50 MG PO TB24
75.0000 mg | ORAL_TABLET | Freq: Every day | ORAL | 3 refills | Status: DC
Start: 2023-05-09 — End: 2023-06-09

## 2023-05-09 NOTE — Progress Notes (Signed)
ADVANCED HEART FAILURE CLINIC NOTE  Referring Physician: Loura Back, NP  Primary Care: Loura Back, NP Primary Cardiologist: Leodis Sias, MD  HPI: Paula Wong is a 77 y.o. female with CAD, HFrEF, history of VT, type 2 diabetes, history of stroke, hypertension, hyperlipidemia presenting today to establish care.  She was admitted to [facility] for CVA in August 2023.  She had an echocardiogram at that time that demonstrated EF of 20 to 25%.  This was followed by left heart catheterization with 40% left main, 40% ostial LAD and 80% mid LAD disease.  Her cardiomyopathy was felt to be out of proportion to CAD and she was treated medically at that time with placement of implantable loop recorder.  In April 2024 she had a follow-up echo with persistently reduced EF and moderate mitral regurgitation.  In May 2024 she was admitted after an episode of sustained polymorphic VT that was felt to be ischemic.  She was started on IV amiodarone with repeat left heart catheterization demonstrating persistently severe disease now status post PCI to the mid LAD the lesion.  She was readmitted in 01/06/2023 with acute hypoxic respiratory failure due to decompensated heart failure.  Since that time she has been seen in [facility] clinic where medications have been further uptitrated.  Interval hx:  - She was doing very well until she tripped over an extension cord and had a fracture in her leg. She is out of her boot and recovering well now (LLE).  - Doing very well from a HFrEF standpoint; euvolemic on exam.   Activity level/exercise tolerance:  NYHA IIB, although, I believe she is overstating her exercise capacity Orthopnea:  Sleeps on 2 pillows Paroxysmal noctural dyspnea:  no Chest pain/pressure:  no Orthostatic lightheadedness:  no Palpitations:  no Lower extremity edema:  no Presyncope/syncope:  no Cough:  no  Past Medical History:  Diagnosis Date   Allergy    CAD (coronary artery disease)    a.  03/2022 Cath: LM 40ost, 82m, LAD 40ost/p, 31m, 5m, LCX 31m, RCA nl, RV branch 70-->Med rx.   Chronic HFrEF (heart failure with reduced ejection fraction) (HCC)    a. 11/2021 Echo: EF 20-25%; b. 03/2022 Echo: EF 20-25%; c. 11/2022 Echo: EF 25-30%, glob HK.   Diabetes (HCC)    Diverticulosis    GERD (gastroesophageal reflux disease)    Hiatal hernia    Hyperlipidemia    Hypertension    Implantable loop recorder present    a. 03/2022 s/p Abbott IQ EL+ (serial [id number]) ILR in setting of cryptogenic stroke.   Ischemic cardiomyopathy    a. 11/2021 Echo: EF 20-25%; b. 03/2022 Echo: EF 20-25%; c. 11/2022 Echo: EF 25-30%, glob HK, nl RV fxn, mild-mod MR, mild AI, AoV sclerosis.   Neuropathy    feet   Schatzki's ring    Stroke [facility])    a. 03/2022 MRI Brain: subacute cortical infarct involving inf and med L occipial pole w/ petechial hemorrhage; b. 03/2022 s/p Abbott Assert IQ EL ILR.   Ventricular tachycardia (HCC)    a. 12/2022 noted on ILR.    Current Outpatient Medications  Medication Sig Dispense Refill   aspirin 81 MG chewable tablet Chew 1 tablet (81 mg total) by mouth daily. 90 tablet 3   atorvastatin (LIPITOR) 80 MG tablet Take 80 mg by mouth at bedtime.     clopidogrel (PLAVIX) 75 MG tablet Take 1 tablet (75 mg total) by mouth daily. 30 tablet 11   FARXIGA 10 MG TABS  tablet Take 10 mg by mouth daily.     furosemide (LASIX) 20 MG tablet Take 1 tablet (20 mg total) by mouth daily. May take extra 20 mg as needed for fluid 90 tablet 3   gabapentin (NEURONTIN) 600 MG tablet Take 600 mg by mouth 2 (two) times daily.     magnesium oxide (MAG-OX) 400 MG tablet Take 400 mg by mouth daily.     metFORMIN (GLUCOPHAGE-XR) 500 MG 24 hr tablet Take 500 mg by mouth daily with breakfast.     metoprolol succinate (TOPROL-XL) 50 MG 24 hr tablet Take 1 tablet (50 mg total) by mouth daily. 90 tablet 3   Multiple Vitamin (MULTIVITAMIN) tablet Take 1 tablet by mouth daily.     nitroGLYCERIN (NITROSTAT) 0.4 MG SL  tablet Place 1 tablet (0.4 mg total) under the tongue every 5 (five) minutes x 3 doses as needed for chest pain. 25 tablet 2   ONETOUCH ULTRA test strip USE TO CHECK BLOOD SUGARS ONCE DAILY AND AS NEEDED     OZEMPIC, 1 MG/DOSE, 4 MG/3ML SOPN Inject 1 mg into the skin once a week.     pantoprazole (PROTONIX) 20 MG tablet Take 1 tablet (20 mg total) by mouth daily. 90 tablet 3   sacubitril-valsartan (ENTRESTO) 97-103 MG Take 1 tablet by mouth 2 (two) times daily. 60 tablet 5   spironolactone (ALDACTONE) 25 MG tablet Take 1 tablet (25 mg total) by mouth daily. 90 tablet 3   TRESIBA FLEXTOUCH 200 UNIT/ML SOPN Take 80 Units by mouth at bedtime.      No current facility-administered medications for this encounter.    No Known Allergies    Social History   Socioeconomic History   Marital status: Single    Spouse name: Not on file   Number of children: 2   Years of education: Not on file   Highest education level: High school graduate  Occupational History   Occupation: Retired  Tobacco Use   Smoking status: Former    Current packs/day: 0.00    Types: Cigarettes    Quit date: 08/20/2003    Years since quitting: 19.7   Smokeless tobacco: Never  Vaping Use   Vaping status: Never Used  Substance and Sexual Activity   Alcohol use: No   Drug use: No   Sexual activity: Not on file  Other Topics Concern   Not on file  Social History Narrative   Not on file   Social Determinants of Health   Financial Resource Strain: Low Risk  (01/07/2023)   Overall Financial Resource Strain (CARDIA)    Difficulty of Paying Living Expenses: Not hard at all  Food Insecurity: No Food Insecurity (01/07/2023)   Hunger Vital Sign    Worried About Running Out of Food in the Last Year: Never true    Ran Out of Food in the Last Year: Never true  Transportation Needs: No Transportation Needs (01/07/2023)   PRAPARE - Administrator, Civil Service (Medical): No    Lack of Transportation  (Non-Medical): No  Physical Activity: Not on file  Stress: Not on file  Social Connections: Not on file  Intimate Partner Violence: Not At Risk (01/07/2023)   Humiliation, Afraid, Rape, and Kick questionnaire    Fear of Current or Ex-Partner: No    Emotionally Abused: No    Physically Abused: No    Sexually Abused: No      Family History  Problem Relation Age of Onset   Colon  cancer Mother 41   Heart attack Father    Cancer Sister        "female cancer"   Heart attack Brother    Stomach cancer Neg Hx    Pancreatic cancer Neg Hx    Esophageal cancer Neg Hx     PHYSICAL EXAM: Vitals:   05/09/23 0925  BP: 110/80  Pulse: 94  SpO2: 97%   GENERAL: Well nourished, well developed, and in no apparent distress at rest.  HEENT: Negative for arcus senilis or xanthelasma. There is no scleral icterus.  The mucous membranes are pink and moist.   NECK: Supple, No masses. Normal carotid upstrokes without bruits. No masses or thyromegaly.    CHEST: There are no chest wall deformities. There is no chest wall tenderness. Respirations are unlabored.  Lungs- CTA B/L CARDIAC:  JVP: 5 cm          Normal rate with regular rhythm. No murmurs, rubs or gallops.  Pulses are 2+ and symmetrical in upper and lower extremities. No edema. Mild swelling in LLE from recent fracturue.  ABDOMEN: Soft, non-tender, non-distended. There are no masses or hepatomegaly. There are normal bowel sounds.  EXTREMITIES: Warm and well perfused with no cyanosis, clubbing.  LYMPHATIC: No axillary or supraclavicular lymphadenopathy.  NEUROLOGIC: Patient is oriented x3 with no focal or lateralizing neurologic deficits.  PSYCH: Patients affect is appropriate, there is no evidence of anxiety or depression.  SKIN: Warm and dry; no lesions or wounds.    DATA REVIEW  ECG: 01/24/23: sinus bradycardia as per my personal interpretation 05/09/23: NSR  ECHO: 12/06/22: LVEF 25%, normal RV function as per my personal  interpretation  CATH: 12/31/22:   Mid Cx lesion is 5% stenosed.   Ost LAD to Prox LAD lesion is 40% stenosed.   Mid LAD-2 lesion is 30% stenosed.   Ost LM lesion is 40% stenosed.   Mid LM to Dist LM lesion is 30% stenosed.   RV Branch lesion is 70% stenosed.   Prox LAD to Mid LAD lesion is 85% stenosed.   Mid LAD-1 lesion is 85% stenosed.   A drug-eluting stent was successfully placed using a SYNERGY XD 3.0X24.   Post intervention, there is a 0% residual stenosis.   Post intervention, there is a 0% residual stenosis.   LV end diastolic pressure is moderately elevated.   2 vessel obstructive CAD with severe LAD stenosis sequentially with heavy calcification. Moderate RCA disease diffusely in a small branch Moderately elevated LVEDP 25 mm Hg Successful PCI of the mid LAD using IVUS guidance, Shockwave intracoronary lithotripsy and DES x 1.  03/27/22:  The left main has 40% ostial 30% distal stenosis; the LAD has 40% ostial to proximal stenosis followed by focal calcified 80% stenosis prior to the takeoff of a septal perforator and the second diagonal vessel with 30% mid stenosis; mild nonobstructive disease in the left circumflex vessel, and diffuse 70% stenosis in the marginal branch of the RCA.     ASSESSMENT & PLAN:  Heart failure with reduced ejection fraction Etiology of ZO:XWRUEAVW cardiomyopathy; plan for CMR to re-evaluate LVEFwith CMR.  NYHA class / AHA Stage: NYHA II, however likely understating the degree of her symptoms. Volume status & Diuretics: Euvolemic, continue Lasix 20 mg daily with repeat labs today Vasodilators:Increase Entresto 97/103mg  BID Beta-Blocker:Metoprolol 50mg , increase to 75mg  today. Will follow up in pharmacy clinic to increase to 100. She has had bradycardia in the past limiting BB uptitration, however, tolerating it well now.  UXL:[id number] 25mg  Cardiometabolic:Farxiga 10mg  Devices therapies & Valvulopathies:Not indicated currently; plan on CMR  to evaluate LV function.  Advanced therapies: Not indicated.  Limited by age will continue aggressive medical management.  2. Hypertension  - well controlled today, see above.  - Repeat labs today.  3. CAD -Multivessel CAD with PCI to the LAD as noted above.  Continue Plavix for 1 year -Lipid for 80 mg daily with aspirin 81 mg  4. VT -Polymorphic VT noted on loop recorder 05/24. QT prolonged on admit.  -Seen by EP.  Suspected d/t CAD. Held off on ICD d/t recent stent placement. -Completing amiodarone taper. TSH and LFTs okay in 05/24.   5. 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 Seen by Neurology -Loop recorder was placed 08/23.  No AF documented -Has been on aspirin, plavix and statin   Yazeed Pryer Advanced Heart Failure Mechanical Circulatory Support

## 2023-05-09 NOTE — Patient Instructions (Signed)
INCREASE Toprol XL to 75 mg ( 1 1/2 tabs) daily.  Labs done today, your results will be available in MyChart, we will contact you for abnormal readings.  Please follow up with our heart failure pharmacist in 1 month  Your physician recommends that you schedule a follow-up appointment in: 4 months ( January 2025) ** PLEASE CALL THE OFFICE IN NOVEMBER TO ARRANGE YOUR FOLLOW UP APPOINTMENT. **  If you have any questions or concerns before your next appointment please send [place] a message through [place] or call our office at [phone].    TO LEAVE A MESSAGE FOR THE NURSE SELECT OPTION 2, PLEASE LEAVE A MESSAGE INCLUDING: YOUR NAME DATE OF BIRTH CALL BACK NUMBER REASON FOR CALL**this is important as we prioritize the call backs  YOU WILL RECEIVE A CALL BACK THE SAME DAY AS LONG AS YOU CALL BEFORE 4:00 PM  At the Advanced Heart Failure Clinic, you and your health needs are our priority. As part of our continuing mission to provide you with exceptional heart care, we have created designated Provider Care Teams. These Care Teams include your primary Cardiologist (physician) and Advanced Practice Providers (APPs- Physician Assistants and Nurse Practitioners) who all work together to provide you with the care you need, when you need it.   You may see any of the following providers on your designated Care Team at your next follow up: Dr Arvilla Meres Dr Marca Ancona Dr. Marcos Eke, NP Robbie Lis, [place] [facility] [place], [place] Brynda Peon, NP Karle Plumber, PharmD   Please be sure to bring in all your medications bottles to every appointment.    Thank you for choosing [facility]

## 2023-05-12 NOTE — Progress Notes (Signed)
Carelink Summary Report / Loop Recorder 

## 2023-05-16 ENCOUNTER — Telehealth (HOSPITAL_COMMUNITY): Payer: Self-pay | Admitting: *Deleted

## 2023-05-16 NOTE — Telephone Encounter (Signed)
Attempted to call patient regarding upcoming cardiac MRI appointment. Unable to leave VM (VM not set up). Johney Frame RN Navigator Cardiac Imaging [facility] [phone] Office

## 2023-05-19 ENCOUNTER — Other Ambulatory Visit (HOSPITAL_COMMUNITY): Payer: Self-pay | Admitting: Cardiology

## 2023-05-19 ENCOUNTER — Ambulatory Visit (HOSPITAL_COMMUNITY)
Admission: RE | Admit: 2023-05-19 | Discharge: 2023-05-19 | Disposition: A | Discharge status: Home / Self Care | Payer: 59 | Source: Ambulatory Visit | Attending: Cardiology | Admitting: Cardiology

## 2023-05-19 ENCOUNTER — Encounter (HOSPITAL_COMMUNITY): Payer: Self-pay | Admitting: Cardiology

## 2023-05-19 DIAGNOSIS — I5022 Chronic systolic (congestive) heart failure: Secondary | ICD-10-CM

## 2023-05-19 MED ORDER — GADOBUTROL 1 MMOL/ML IV SOLN
10.0000 mL | Freq: Once | INTRAVENOUS | Status: AC | PRN
  Administered 2023-05-19: 10 mL via INTRAVENOUS

## 2023-06-02 ENCOUNTER — Ambulatory Visit (INDEPENDENT_AMBULATORY_CARE_PROVIDER_SITE_OTHER): Payer: 59

## 2023-06-02 DIAGNOSIS — I639 Cerebral infarction, unspecified: Secondary | ICD-10-CM | POA: Diagnosis not present

## 2023-06-03 LAB — CUP PACEART REMOTE DEVICE CHECK
Date Time Interrogation Session: 20241014020448
Pulse Gen Serial Number: 511012456

## 2023-06-05 NOTE — Progress Notes (Incomplete)
***In Progress***    Advanced Heart Failure Clinic Note  Referring Physician: Loura Back, NP  Primary Care: Loura Back, NP Primary Cardiologist: Leodis Sias, MD HF Cardiologist: Dr. Gasper Lloyd  HPI:  Paula Wong is a 77 y.o. female with CAD, HFrEF, history of VT, type 2 diabetes, history of stroke, hypertension, hyperlipidemia presenting today to establish care.   She was admitted to [facility] for CVA in August 2023. She had an echocardiogram at that time that demonstrated EF of 20 to 25%. This was followed by left heart catheterization with 40% left main, 40% ostial LAD and 80% mid LAD disease. Her cardiomyopathy was felt to be out of proportion to CAD and she was treated medically at that time with placement of implantable loop recorder. In April 2024 she had a follow-up echo with persistently reduced EF and moderate mitral regurgitation. In May 2024 she was admitted after an episode of sustained polymorphic VT that was felt to be ischemic. She was started on IV amiodarone with repeat left heart catheterization demonstrating persistently severe disease now status post PCI to the mid LAD the lesion. She was readmitted in 01/06/2023 with acute hypoxic respiratory failure due to decompensated heart failure. Since that time she has been seen in [facility] clinic where medications have been further uptitrated.   Recently seen by Dr. Gasper Lloyd on 05/09/23. She was doing very well until she tripped over an extension cord and had a fracture in her leg. She is out of her boot and recovering well now (LLE). Doing very well from a HFrEF standpoint; euvolemic on exam.   Today he returns to HF clinic for pharmacist medication titration. At last visit with MD, metoprolol succinate was increased to 75 mg daily.   Overall feeling ***. Dizziness, lightheadedness, fatigue:  Chest pain or palpitations:  How is your breathing?: *** SOB: Able to complete all ADLs. Activity level ***  Weight at home pounds.  Takes furosemide/torsemide/bumex *** mg *** daily.  LEE PND/Orthopnea  Appetite *** Low-salt diet:   Physical Exam Cost/affordability of meds   HF Medications: Metoprolol succinate 75 mg daily Entresto 97/103 mg BID Spironolactone 25 mg daily Farxiga 10 mg daily Lasix 20 mg daily    Has the patient been experiencing any side effects to the medications prescribed?  {YES NO:22349}  Does the patient have any problems obtaining medications due to transportation or finances?   {YES NO:22349}  Understanding of regimen: {excellent/good/fair/poor:19665} Understanding of indications: {excellent/good/fair/poor:19665} Potential of compliance: {excellent/good/fair/poor:19665} Patient understands to avoid NSAIDs. Patient understands to avoid decongestants.    Pertinent Lab Values: 05/09/23 Serum creatinine 1.04, BUN 8, Potassium 3.5, Sodium 141, BNP 272.8  Vital Signs: Weight: *** (last clinic weight: 184 lbs) Blood pressure: ***  Heart rate: ***   Assessment/Plan: Heart failure with reduced ejection fraction Etiology of WG:NFAOZHYQ cardiomyopathy; plan for CMR to re-evaluate LVEFwith CMR.  NYHA class / AHA Stage: NYHA II, however likely understating the degree of her symptoms. Volume status & Diuretics: Euvolemic, continue Lasix 20 mg daily with repeat labs today Vasodilators:Continue Entresto 97/103 mg BID Beta-Blocker:Metoprolol 50mg , increase to 75mg  today. Will follow up in pharmacy clinic to increase to 100. She has had bradycardia in the past limiting BB uptitration, however, tolerating it well now.  MRA: continue spironolactone 25 mg daily Cardiometabolic: continue Farxiga 10 mg daily Devices therapies & Valvulopathies:Not indicated currently; plan on CMR to evaluate LV function.  Advanced therapies: Not indicated.  Limited by age will continue aggressive medical management.   2. Hypertension  -  well controlled today, see above.  - Repeat labs today.   3.  CAD -Multivessel CAD with PCI to the LAD as noted above. Continue Plavix for 1 year -Lipid for 80 mg daily with aspirin 81 mg   4. VT -Polymorphic VT noted on loop recorder 05/24. QT prolonged on admit.  -Seen by EP.  Suspected d/t CAD. Held off on ICD d/t recent stent placement. -Completing amiodarone taper. TSH and LFTs okay in 05/24.   5. 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 Seen by Neurology -Loop recorder was placed 08/23.  No AF documented -Has been on aspirin, plavix and statin  Follow up ***   Karle Plumber, PharmD, BCPS, BCCP, CPP Heart Failure Clinic Pharmacist [phone]

## 2023-06-09 ENCOUNTER — Ambulatory Visit (HOSPITAL_COMMUNITY)
Admission: RE | Admit: 2023-06-09 | Discharge: 2023-06-09 | Disposition: A | Discharge status: Home / Self Care | Payer: 59 | Source: Ambulatory Visit | Attending: Cardiology | Admitting: Cardiology

## 2023-06-09 DIAGNOSIS — I252 Old myocardial infarction: Secondary | ICD-10-CM | POA: Insufficient documentation

## 2023-06-09 DIAGNOSIS — Z7984 Long term (current) use of oral hypoglycemic drugs: Secondary | ICD-10-CM | POA: Diagnosis not present

## 2023-06-09 DIAGNOSIS — Z8673 Personal history of transient ischemic attack (TIA), and cerebral infarction without residual deficits: Secondary | ICD-10-CM | POA: Diagnosis not present

## 2023-06-09 DIAGNOSIS — I110 Hypertensive heart disease with heart failure: Secondary | ICD-10-CM | POA: Insufficient documentation

## 2023-06-09 DIAGNOSIS — Z7902 Long term (current) use of antithrombotics/antiplatelets: Secondary | ICD-10-CM | POA: Diagnosis not present

## 2023-06-09 DIAGNOSIS — I5022 Chronic systolic (congestive) heart failure: Secondary | ICD-10-CM | POA: Insufficient documentation

## 2023-06-09 DIAGNOSIS — E785 Hyperlipidemia, unspecified: Secondary | ICD-10-CM | POA: Insufficient documentation

## 2023-06-09 DIAGNOSIS — Z79899 Other long term (current) drug therapy: Secondary | ICD-10-CM | POA: Diagnosis not present

## 2023-06-09 DIAGNOSIS — E118 Type 2 diabetes mellitus with unspecified complications: Secondary | ICD-10-CM | POA: Diagnosis not present

## 2023-06-09 DIAGNOSIS — I2510 Atherosclerotic heart disease of native coronary artery without angina pectoris: Secondary | ICD-10-CM | POA: Insufficient documentation

## 2023-06-09 DIAGNOSIS — I255 Ischemic cardiomyopathy: Secondary | ICD-10-CM | POA: Diagnosis not present

## 2023-06-09 DIAGNOSIS — Z7982 Long term (current) use of aspirin: Secondary | ICD-10-CM | POA: Insufficient documentation

## 2023-06-09 MED ORDER — METOPROLOL SUCCINATE ER 50 MG PO TB24
ORAL_TABLET | ORAL | 3 refills | Status: DC
Start: 2023-06-09 — End: 2023-06-24

## 2023-06-09 NOTE — Progress Notes (Signed)
Advanced Heart Failure Clinic Note   Referring Physician: Loura Back, NP  Primary Care: Loura Back, NP Primary Cardiologist: Leodis Sias, MD HF Cardiologist: Dr. Gasper Lloyd  HPI:  Paula Wong is a 77 y.o. female with CAD, HFrEF, history of VT, type 2 diabetes, history of stroke, hypertension, and hyperlipidemia.   She was admitted to [facility] for CVA in August 2023. She had an echocardiogram at that time that demonstrated EF of 20 to 25%. This was followed by left heart catheterization with 40% left main, 40% ostial LAD and 80% mid LAD disease. Her cardiomyopathy was felt to be out of proportion to CAD and she was treated medically at that time with placement of implantable loop recorder. In April 2024, she had a follow-up echo with persistently reduced EF and moderate mitral regurgitation. In May 2024 she was admitted after an episode of sustained polymorphic VT that was felt to be ischemic. She was started on IV amiodarone with repeat left heart catheterization demonstrating persistently severe disease now status post PCI to the mid LAD lesion. She was readmitted in 01/06/2023 with acute hypoxic respiratory failure due to decompensated heart failure. Since that time she has been seen in [facility] clinic where medications have been further uptitrated.   Recently seen by Dr. Gasper Lloyd on 05/09/23. She was doing very well until she tripped over an extension cord and had a fracture in her leg. She was out of her boot and recovering well. Did very well from a HFrEF standpoint; euvolemic on exam.   05/19/23: CMR showed LVEF 21%, RVEF 45%. Patient has known CAD, but LGE pattern does not completely fit an ischemic cardiomyopathy. There is mid-wall LGE in the basal anterior, septal, and inferior segments. This is not a typical coronary pattern and could be seen with myocarditis or infiltrative disease (amyloidosis, sarcoidosis). The subendocardial LGE in the inferolateral wall and mid inferior wall  could be seen with prior MI, but reviewing catheterization report, the coronaries supplying these territories do not have severe disease. Cardiac amyloidosis could be a consideration given age and ethnicity, but there is no significant LVH. Mildly elevated extracellular volume percentage suggests increased myocardial fibrosis content.  Today she returns to HF clinic for pharmacist medication titration. At last visit with MD, metoprolol succinate was increased to 75 mg daily. However, she reports having difficulty breaking the metoprolol tablets in half and has actually been taking one full tablet (50 mg) twice daily for a daily dose of 100 mg. Overall feeling great. No dizziness or lightheadedness. No fatigue. No CP/palpitations. No SOB/DOE. Home weight has been stable between 181-183 lbs. She takes Lasix 20 mg daily and has not needed any extra Lasix doses. Only notes occasional mild swelling in her LLE from where she recently broke her ankle. No PND/orthopnea. Appetite is normal. She follows a low-salt diet. Home BP runs between 124-127 mmHg but can increase to ~140s with dietary indiscretion (high salt content). In clinic, BP 128/72 mmHg.   HF Medications: Metoprolol succinate 75 mg daily - taking differently: 50 mg BID  Entresto 97/103 mg BID Spironolactone 25 mg daily Farxiga 10 mg daily Lasix 20 mg daily    Has the patient been experiencing any side effects to the medications prescribed? No  Does the patient have any problems obtaining medications due to transportation or finances? No; [employer] Medicare + [place] Medicaid   Understanding of regimen: good Understanding of indications: good Potential of compliance: good Patient understands to avoid NSAIDs. Patient understands to avoid  decongestants.    Pertinent Lab Values: 05/09/23 Serum creatinine 1.04, BUN 8, Potassium 3.5, Sodium 141, BNP 272.8  Vital Signs: Weight: 185 lbs (last clinic weight: 184 lbs) Blood pressure: 128/72 mmHg  Heart  rate: 75 bpm   Assessment/Plan: Heart failure with reduced ejection fraction Etiology of PP:IRJJOACZ cardiomyopathy NYHA class / AHA Stage: NYHA II Volume status & Diuretics: Euvolemic, continue Lasix 20 mg daily  Beta-Blocker: increase metoprolol to 50 mg qAM/100 mg qPM Vasodilators:continue Entresto 97/103 mg BID MRA: continue spironolactone 25 mg daily Cardiometabolic: continue Farxiga 10 mg daily Devices therapies & Valvulopathies: not indicated Advanced therapies: not indicated. Limited by age will continue aggressive medical management.   2. Hypertension  - controlled today, BP 128/72 mmHg   3. CAD -Multivessel CAD with PCI to the LAD as noted above. Continue Plavix 75 mg daily for 1 year -continue atorvastatin 80 mg daily with aspirin 81 mg daily   4. VT -Polymorphic VT noted on loop recorder 12/2022. QT prolonged on admit.  -Seen by EP. Suspected d/t CAD. ICD was held d/t recent stent placement.    5. Hx CVA -Subacute PCA infarct 03/2022 -Felt to be likely embolic cryptogenic source. Seen by Neurology -Loop recorder was placed 03/2022. No AF documented -Has been on aspirin, plavix and statin  Follow up with Dr. Gasper Lloyd in January 2025   Karle Plumber, PharmD, BCPS, [facility], CPP Heart Failure Clinic Pharmacist [phone]

## 2023-06-09 NOTE — Patient Instructions (Signed)
It was a pleasure seeing you today!  MEDICATIONS: -We are changing your medications today -Increase metoprolol succinate to 50 mg (1 tablet) every morning and 100 mg (2 tablets) every evening -Call if you have questions about your medications.   NEXT APPOINTMENT: Return to clinic in ~January 2025 with Dr. Gasper Lloyd. ** PLEASE CALL THE OFFICE IN NOVEMBER TO ARRANGE YOUR FOLLOW UP APPOINTMENT. **  In general, to take care of your heart failure: -Limit your fluid intake to 2 Liters (half-gallon) per day.   -Limit your salt intake to ideally 2-3 grams (2000-3000 mg) per day. -Weigh yourself daily and record, and bring that "weight diary" to your next appointment.  (Weight gain of 2-3 pounds in 1 day typically means fluid weight.) -The medications for your heart are to help your heart and help you live longer.   -Please contact us before stopping any of your heart medications.  Call the clinic at [phone] with questions or to reschedule future appointments.

## 2023-06-17 NOTE — Progress Notes (Signed)
[employer] Loop Recorder  

## 2023-06-24 ENCOUNTER — Telehealth (HOSPITAL_COMMUNITY): Payer: Self-pay | Admitting: Pharmacist

## 2023-06-24 ENCOUNTER — Other Ambulatory Visit (HOSPITAL_COMMUNITY): Payer: Self-pay

## 2023-06-24 DIAGNOSIS — I5022 Chronic systolic (congestive) heart failure: Secondary | ICD-10-CM

## 2023-06-24 MED ORDER — METOPROLOL SUCCINATE ER 50 MG PO TB24: 50.0000 mg | ORAL_TABLET | Freq: Two times a day (BID) | ORAL | 3 refills | Status: DC

## 2023-06-24 NOTE — Telephone Encounter (Signed)
Patient called with concerns regarding the increased dose of metoprolol. States she feels poorly after increasing the dose and has had diarrhea. Would like to decrease metoprolol succinate back to 50 mg BID. Will update medication list.   Karle Plumber, PharmD, BCPS, BCCP, CPP Heart Failure Clinic Pharmacist [phone]

## 2023-06-27 ENCOUNTER — Emergency Department (HOSPITAL_COMMUNITY): Payer: 59

## 2023-06-27 ENCOUNTER — Other Ambulatory Visit: Payer: Self-pay

## 2023-06-27 ENCOUNTER — Telehealth: Payer: Self-pay

## 2023-06-27 ENCOUNTER — Inpatient Hospital Stay (HOSPITAL_COMMUNITY)
Admission: EM | Admit: 2023-06-27 | Discharge: 2023-07-01 | DRG: 277 | Disposition: A | Discharge status: Home / Self Care | Payer: 59 | Attending: Cardiology | Admitting: Cardiology

## 2023-06-27 ENCOUNTER — Encounter (HOSPITAL_COMMUNITY): Payer: Self-pay

## 2023-06-27 DIAGNOSIS — I493 Ventricular premature depolarization: Secondary | ICD-10-CM | POA: Diagnosis present

## 2023-06-27 DIAGNOSIS — Z8249 Family history of ischemic heart disease and other diseases of the circulatory system: Secondary | ICD-10-CM | POA: Diagnosis not present

## 2023-06-27 DIAGNOSIS — I472 Ventricular tachycardia, unspecified: Principal | ICD-10-CM | POA: Diagnosis present

## 2023-06-27 DIAGNOSIS — I2510 Atherosclerotic heart disease of native coronary artery without angina pectoris: Secondary | ICD-10-CM | POA: Diagnosis present

## 2023-06-27 DIAGNOSIS — Z79899 Other long term (current) drug therapy: Secondary | ICD-10-CM

## 2023-06-27 DIAGNOSIS — Z8673 Personal history of transient ischemic attack (TIA), and cerebral infarction without residual deficits: Secondary | ICD-10-CM | POA: Diagnosis not present

## 2023-06-27 DIAGNOSIS — E7800 Pure hypercholesterolemia, unspecified: Secondary | ICD-10-CM | POA: Diagnosis present

## 2023-06-27 DIAGNOSIS — I255 Ischemic cardiomyopathy: Secondary | ICD-10-CM | POA: Diagnosis present

## 2023-06-27 DIAGNOSIS — Z7984 Long term (current) use of oral hypoglycemic drugs: Secondary | ICD-10-CM | POA: Diagnosis not present

## 2023-06-27 DIAGNOSIS — Z800 Family history of malignant neoplasm of digestive organs: Secondary | ICD-10-CM

## 2023-06-27 DIAGNOSIS — R55 Syncope and collapse: Secondary | ICD-10-CM | POA: Diagnosis present

## 2023-06-27 DIAGNOSIS — E876 Hypokalemia: Secondary | ICD-10-CM | POA: Diagnosis present

## 2023-06-27 DIAGNOSIS — I4729 Other ventricular tachycardia: Secondary | ICD-10-CM | POA: Diagnosis present

## 2023-06-27 DIAGNOSIS — I4901 Ventricular fibrillation: Secondary | ICD-10-CM | POA: Diagnosis present

## 2023-06-27 DIAGNOSIS — Z7902 Long term (current) use of antithrombotics/antiplatelets: Secondary | ICD-10-CM

## 2023-06-27 DIAGNOSIS — R001 Bradycardia, unspecified: Secondary | ICD-10-CM | POA: Diagnosis present

## 2023-06-27 DIAGNOSIS — Z955 Presence of coronary angioplasty implant and graft: Secondary | ICD-10-CM | POA: Diagnosis not present

## 2023-06-27 DIAGNOSIS — E119 Type 2 diabetes mellitus without complications: Secondary | ICD-10-CM | POA: Diagnosis present

## 2023-06-27 DIAGNOSIS — I110 Hypertensive heart disease with heart failure: Secondary | ICD-10-CM | POA: Diagnosis present

## 2023-06-27 DIAGNOSIS — Z87891 Personal history of nicotine dependence: Secondary | ICD-10-CM

## 2023-06-27 DIAGNOSIS — I4891 Unspecified atrial fibrillation: Secondary | ICD-10-CM | POA: Diagnosis present

## 2023-06-27 DIAGNOSIS — Z7982 Long term (current) use of aspirin: Secondary | ICD-10-CM

## 2023-06-27 DIAGNOSIS — Z794 Long term (current) use of insulin: Secondary | ICD-10-CM

## 2023-06-27 DIAGNOSIS — I5022 Chronic systolic (congestive) heart failure: Secondary | ICD-10-CM | POA: Diagnosis present

## 2023-06-27 LAB — COMPREHENSIVE METABOLIC PANEL
ALT: 10 U/L (ref 0–44)
AST: 13 U/L — ABNORMAL LOW (ref 15–41)
Albumin: 3.6 g/dL (ref 3.5–5.0)
Alkaline Phosphatase: 64 U/L (ref 38–126)
Anion gap: 11 (ref 5–15)
BUN: 7 mg/dL — ABNORMAL LOW (ref 8–23)
CO2: 25 mmol/L (ref 22–32)
Calcium: 9.3 mg/dL (ref 8.9–10.3)
Chloride: 107 mmol/L (ref 98–111)
Creatinine, Ser: 1.01 mg/dL — ABNORMAL HIGH (ref 0.44–1.00)
GFR, Estimated: 57 mL/min — ABNORMAL LOW (ref >60)
Glucose, Bld: 99 mg/dL (ref 70–99)
Potassium: 2.9 mmol/L — ABNORMAL LOW (ref 3.5–5.1)
Sodium: 143 mmol/L (ref 135–145)
Total Bilirubin: 1.7 mg/dL — ABNORMAL HIGH (ref <1.2)
Total Protein: 7.2 g/dL (ref 6.5–8.1)

## 2023-06-27 LAB — CBC
HCT: 43.6 % (ref 36.0–46.0)
HCT: 42 % (ref 36.0–46.0)
Hemoglobin: 13.9 g/dL (ref 12.0–15.0)
Hemoglobin: 13.4 g/dL (ref 12.0–15.0)
MCH: 26.8 pg (ref 26.0–34.0)
MCH: 26.7 pg (ref 26.0–34.0)
MCHC: 31.9 g/dL (ref 30.0–36.0)
MCHC: 31.9 g/dL (ref 30.0–36.0)
MCV: 84 fL (ref 80.0–100.0)
MCV: 83.8 fL (ref 80.0–100.0)
Platelets: 261 10*3/uL (ref 150–400)
Platelets: 269 10*3/uL (ref 150–400)
RBC: 5.19 MIL/uL — ABNORMAL HIGH (ref 3.87–5.11)
RBC: 5.01 MIL/uL (ref 3.87–5.11)
RDW: 14 % (ref 11.5–15.5)
RDW: 14.2 % (ref 11.5–15.5)
WBC: 6 10*3/uL (ref 4.0–10.5)
WBC: 5.7 10*3/uL (ref 4.0–10.5)
nRBC: 0 % (ref 0.0–0.2)
nRBC: 0 % (ref 0.0–0.2)

## 2023-06-27 LAB — TROPONIN I (HIGH SENSITIVITY)
Troponin I (High Sensitivity): 3 ng/L (ref <18)
Troponin I (High Sensitivity): 3 ng/L (ref <18)

## 2023-06-27 LAB — MRSA NEXT GEN BY PCR, NASAL: MRSA by PCR Next Gen: NOT DETECTED

## 2023-06-27 LAB — CREATININE, SERUM
Creatinine, Ser: 0.98 mg/dL (ref 0.44–1.00)
GFR, Estimated: 59 mL/min — ABNORMAL LOW (ref >60)

## 2023-06-27 LAB — MAGNESIUM: Magnesium: 2 mg/dL (ref 1.7–2.4)

## 2023-06-27 LAB — GLUCOSE, CAPILLARY: Glucose-Capillary: 98 mg/dL (ref 70–99)

## 2023-06-27 LAB — CBG MONITORING, ED: Glucose-Capillary: 116 mg/dL — ABNORMAL HIGH (ref 70–99)

## 2023-06-27 MED ORDER — POTASSIUM CHLORIDE 10 MEQ/100ML IV SOLN
10.0000 meq | Freq: Once | INTRAVENOUS | Status: AC
  Administered 2023-06-27: 10 meq via INTRAVENOUS
  Filled 2023-06-27: qty 100

## 2023-06-27 MED ORDER — CLOPIDOGREL BISULFATE 75 MG PO TABS
75.0000 mg | ORAL_TABLET | Freq: Every day | ORAL | Status: DC
Start: 2023-06-28 — End: 2023-07-01
  Administered 2023-06-28 – 2023-07-01 (×4): 75 mg via ORAL
  Filled 2023-06-27 (×4): qty 1

## 2023-06-27 MED ORDER — SACUBITRIL-VALSARTAN 97-103 MG PO TABS
1.0000 | ORAL_TABLET | Freq: Two times a day (BID) | ORAL | Status: DC
  Administered 2023-06-27 – 2023-06-29 (×5): 1 via ORAL
  Filled 2023-06-27 (×6): qty 1

## 2023-06-27 MED ORDER — HEPARIN (PORCINE) 25000 UT/250ML-% IV SOLN
1150.0000 [IU]/h | INTRAVENOUS | Status: DC
  Administered 2023-06-27 – 2023-06-28 (×2): 1150 [IU]/h via INTRAVENOUS
  Filled 2023-06-27 (×2): qty 250

## 2023-06-27 MED ORDER — ATORVASTATIN CALCIUM 80 MG PO TABS
80.0000 mg | ORAL_TABLET | Freq: Every day | ORAL | Status: DC
  Administered 2023-06-27 – 2023-06-30 (×4): 80 mg via ORAL
  Filled 2023-06-27 (×3): qty 2
  Filled 2023-06-27: qty 1

## 2023-06-27 MED ORDER — ACETAMINOPHEN 325 MG PO TABS: 650.0000 mg | ORAL_TABLET | ORAL | Status: DC | PRN

## 2023-06-27 MED ORDER — ORAL CARE MOUTH RINSE: 15.0000 mL | OROMUCOSAL | Status: DC | PRN

## 2023-06-27 MED ORDER — PANTOPRAZOLE SODIUM 20 MG PO TBEC
20.0000 mg | DELAYED_RELEASE_TABLET | Freq: Every day | ORAL | Status: DC
Start: 2023-06-28 — End: 2023-07-01
  Administered 2023-06-28 – 2023-07-01 (×4): 20 mg via ORAL
  Filled 2023-06-27 (×4): qty 1

## 2023-06-27 MED ORDER — DAPAGLIFLOZIN PROPANEDIOL 10 MG PO TABS
10.0000 mg | ORAL_TABLET | Freq: Every day | ORAL | Status: DC
Start: 2023-06-28 — End: 2023-07-01
  Administered 2023-06-28 – 2023-07-01 (×4): 10 mg via ORAL
  Filled 2023-06-27 (×4): qty 1

## 2023-06-27 MED ORDER — MAGNESIUM OXIDE -MG SUPPLEMENT 400 (240 MG) MG PO TABS
400.0000 mg | ORAL_TABLET | Freq: Every day | ORAL | Status: DC
Start: 2023-06-28 — End: 2023-07-01
  Administered 2023-06-28 – 2023-07-01 (×4): 400 mg via ORAL
  Filled 2023-06-27 (×4): qty 1

## 2023-06-27 MED ORDER — ASPIRIN 81 MG PO CHEW
81.0000 mg | CHEWABLE_TABLET | Freq: Every day | ORAL | Status: DC
  Administered 2023-06-28 – 2023-07-01 (×4): 81 mg via ORAL
  Filled 2023-06-27 (×4): qty 1

## 2023-06-27 MED ORDER — METOPROLOL TARTRATE 25 MG PO TABS
75.0000 mg | ORAL_TABLET | Freq: Two times a day (BID) | ORAL | Status: DC
  Administered 2023-06-27 – 2023-06-28 (×3): 75 mg via ORAL
  Filled 2023-06-27 (×3): qty 3

## 2023-06-27 MED ORDER — FUROSEMIDE 20 MG PO TABS
20.0000 mg | ORAL_TABLET | Freq: Every day | ORAL | Status: DC
  Administered 2023-06-28 – 2023-06-29 (×2): 20 mg via ORAL
  Filled 2023-06-27 (×2): qty 1

## 2023-06-27 MED ORDER — POTASSIUM CHLORIDE CRYS ER 20 MEQ PO TBCR
20.0000 meq | EXTENDED_RELEASE_TABLET | Freq: Once | ORAL | Status: AC
  Administered 2023-06-27: 20 meq via ORAL
  Filled 2023-06-27: qty 1

## 2023-06-27 MED ORDER — GABAPENTIN 300 MG PO CAPS
600.0000 mg | ORAL_CAPSULE | Freq: Two times a day (BID) | ORAL | Status: DC
  Administered 2023-06-27 – 2023-07-01 (×8): 600 mg via ORAL
  Filled 2023-06-27 (×8): qty 2

## 2023-06-27 MED ORDER — INSULIN ASPART 100 UNIT/ML IJ SOLN
0.0000 [IU] | Freq: Three times a day (TID) | INTRAMUSCULAR | Status: DC
  Administered 2023-06-28 (×2): 3 [IU] via SUBCUTANEOUS
  Administered 2023-06-29 (×3): 2 [IU] via SUBCUTANEOUS
  Administered 2023-07-01: 3 [IU] via SUBCUTANEOUS

## 2023-06-27 MED ORDER — ONDANSETRON HCL 4 MG/2ML IJ SOLN: 4.0000 mg | Freq: Four times a day (QID) | INTRAMUSCULAR | Status: DC | PRN

## 2023-06-27 MED ORDER — MEXILETINE HCL 150 MG PO CAPS
150.0000 mg | ORAL_CAPSULE | Freq: Three times a day (TID) | ORAL | Status: DC
  Administered 2023-06-27 – 2023-06-29 (×5): 150 mg via ORAL
  Filled 2023-06-27 (×5): qty 1

## 2023-06-27 MED ORDER — HEPARIN SODIUM (PORCINE) 5000 UNIT/ML IJ SOLN: 5000.0000 [IU] | Freq: Three times a day (TID) | INTRAMUSCULAR | Status: DC

## 2023-06-27 MED ORDER — SPIRONOLACTONE 25 MG PO TABS
25.0000 mg | ORAL_TABLET | Freq: Every day | ORAL | Status: DC
Start: 2023-06-28 — End: 2023-07-01
  Administered 2023-06-28 – 2023-07-01 (×3): 25 mg via ORAL
  Filled 2023-06-27: qty 2
  Filled 2023-06-27: qty 1
  Filled 2023-06-27 (×2): qty 2

## 2023-06-27 MED ORDER — POTASSIUM CHLORIDE 20 MEQ PO PACK
60.0000 meq | PACK | Freq: Two times a day (BID) | ORAL | Status: DC
  Administered 2023-06-27 – 2023-06-28 (×2): 60 meq via ORAL
  Filled 2023-06-27 (×2): qty 3

## 2023-06-27 MED ORDER — HEPARIN BOLUS VIA INFUSION
3500.0000 [IU] | Freq: Once | INTRAVENOUS | Status: AC
  Administered 2023-06-27: 3500 [IU] via INTRAVENOUS
  Filled 2023-06-27: qty 3500

## 2023-06-27 NOTE — Progress Notes (Signed)
PHARMACY - ANTICOAGULATION CONSULT NOTE  Pharmacy Consult for heparin infusion Indication: atrial fibrillation  No Known Allergies  Patient Measurements: Height: 5\' 6"  (167.6 cm) Weight: 82.1 kg (181 lb) IBW/kg (Calculated) : 59.3 Heparin Dosing Weight: 76.5 kg  Vital Signs: Temp: 98.1 F (36.7 C) (11/08 1817) Temp Source: Oral (11/08 1817) BP: 143/80 (11/08 1930) Pulse Rate: 72 (11/08 1930)  Labs: Recent Labs    06/27/23 1449 06/27/23 1631 06/27/23 1938  HGB 13.9  --  13.4  HCT 43.6  --  42.0  PLT 261  --  269  CREATININE 1.01*  --   --   TROPONINIHS 3 3  --     Estimated Creatinine Clearance: 49.6 mL/min (A) (by C-G formula based on SCr of 1.01 mg/dL (H)).   Medical History: Past Medical History:  Diagnosis Date   Allergy    CAD (coronary artery disease)    a. 03/2022 Cath: LM 40ost, 51m, LAD 40ost/p, 4m, 60m, LCX 65m, RCA nl, RV branch 70-->Med rx.   Chronic HFrEF (heart failure with reduced ejection fraction) (HCC)    a. 11/2021 Echo: EF 20-25%; b. 03/2022 Echo: EF 20-25%; c. 11/2022 Echo: EF 25-30%, glob HK.   Diabetes (HCC)    Diverticulosis    GERD (gastroesophageal reflux disease)    Hiatal hernia    Hyperlipidemia    Hypertension    Implantable loop recorder present    a. 03/2022 s/p Abbott IQ EL+ (serial [id number]) ILR in setting of cryptogenic stroke.   Ischemic cardiomyopathy    a. 11/2021 Echo: EF 20-25%; b. 03/2022 Echo: EF 20-25%; c. 11/2022 Echo: EF 25-30%, glob HK, nl RV fxn, mild-mod MR, mild AI, AoV sclerosis.   Neuropathy    feet   Schatzki's ring    Stroke [facility])    a. 03/2022 MRI Brain: subacute cortical infarct involving inf and med L occipial pole w/ petechial hemorrhage; b. 03/2022 s/p Abbott Assert IQ EL ILR.   Ventricular tachycardia (HCC)    a. 12/2022 noted on ILR.   Assessment: 77yoF admitted for SVT, found with 7-minute run of afib. CHADSVASc score of 36 (age, sex, CHF, CAD, HTN, DM, CVA history), pharmacy has been consulted to initiate  heparin infusio nfor anticoagulation in this setting. Previously on clopidogrel, aspirin due to history of PCI likely to stop per cardiology.  Goal of Therapy:  Heparin level 0.3-0.7 units/ml Monitor platelets by anticoagulation protocol: Yes   Plan:  Give 3500 units bolus x 1 Start heparin infusion at 1150 units/hr Check anti-Xa level in 8 hours and daily while on heparin Continue to monitor H&H and platelets F/u transition to oral therapy  Rutherford Nail, PharmD PGY2 Critical Care Pharmacy Resident 06/27/2023,8:16 PM

## 2023-06-27 NOTE — Telephone Encounter (Signed)
Police called back. Patient is alive and states she feels pretty good, resting in chair.  No issues since last night around 5pm.  She reports passing out last night at time of event, EMS called, she told them it was her blood sugar and not her heart. They did not take her and reports she "slept better last night that had in her whole life after drinking a glass of sweet tea".   I talked to patient and let her know that she had a very dangerous life threatening heart rhythm (VT) sustained for approx 7 mins.  She is to go to the ER now, if symptomatic to go by EMS, patient says she feels fine and will have her friend drive her, should be there within 1-2 hours, but knows best to go by EMS.   Notified physician team in hospital, patient on her way.

## 2023-06-27 NOTE — ED Provider Triage Note (Signed)
Emergency Medicine Provider Triage Evaluation Note  Almarie Glazer , a 77 y.o. female  was evaluated in triage.  Pt complains of a syncopal episode that happened yesterday.  States she felt hot from head to toe, and then passed out.  EMS was called by a friend she was on the phone with, but she did not go to the ER yesterday night.  Reports she felt fine overnight.  Reports she was told to come to the ER due to an abnormal rhythm on her defibrillator. Feels at baseline currently, denies any chest pain or shortness of breath.  Review of Systems  Positive: As above Negative: As above  Physical Exam  There were no vitals taken for this visit. Gen:   Awake, no distress   Resp:  Normal effort  MSK:   Moves extremities without difficulty    Medical Decision Making  Medically screening exam initiated at 2:30 PM.  Appropriate orders placed.  Woodrow Natividad was informed that the remainder of the evaluation will be completed by another provider, this initial triage assessment does not replace that evaluation, and the importance of remaining in the ED until their evaluation is complete.     Arabella Merles, PA-C 06/27/23 1434

## 2023-06-27 NOTE — Progress Notes (Signed)
eLink Physician-Brief Progress Note Patient Name: Paula Wong DOB: 13-Oct-1945 MRN: [id number]   Date of Service  06/27/2023  HPI/Events of Note  77 year old with a history of coronary artery disease, heart failure, and ventricular tachycardia in the setting of metabolic syndrome found to have recurrent VT.  Is mildly hypertensive but otherwise normal vitals.  Results consistent with hypokalemia, mild elevation in creatinine, and unremarkable chest radiograph.  eICU Interventions  Maintain heparin infusion  Electrolyte replacement as needed  DVT prophylaxis with heparin GI prophylaxis not indicated, home pantoprazole     Intervention Category Evaluation Type: New Patient Evaluation  Arie Powell 06/27/2023, 9:14 PM

## 2023-06-27 NOTE — ED Triage Notes (Addendum)
Pt states she was on the phone with her friend yesterday and passed out. Pt states she felt hot and went out. Pt states EMS was there and she did not go because she felt okay. Pt states a Emergency planning/management officer came to her house and they told her that [facility] thought she needed to come be seen. Pt states they said it was more than her blood sugar dropping because her defibrillator showed she should have been dead. Pt denies any complaints at this time.

## 2023-06-27 NOTE — ED Provider Notes (Signed)
[place] EMERGENCY DEPARTMENT AT [facility] Provider Note   CSN: [id number] Arrival date & time: 06/27/23  1358     History  Chief Complaint  Patient presents with   Loss of Consciousness    Paula Wong is a 77 y.o. female.  Pt is a 77y/o female with hx of CAD s/p PCI to the LAD, HFrEF, history of VT with loop recorder, type 2 diabetes, history of stroke, hypertension, hyperlipidemia who is presenting today after having a syncopal episode yesterday.  Patient reports that she was sitting in a chair talking to her neighbor around 5 PM when suddenly she said a hot sensation started at her head and moved down her body and she felt strange and the next thing she remembers is waking up to a bunch of people standing her around her from EMS.  She said after waking up in figuring out why everyone was there she felt fine.  She said they checked her out and her vital signs were normal ask her if she wanted to be transported to the hospital and she refused.  After that she denied having any abnormal feelings said she had some sweet tea went to bed and slept all night.  Reports she is felt fine today denies any chest pain, shortness of breath, new swelling, nausea or vomiting.  She denies any further episodes of syncope.  Patient reports she was on the phone with pharmacy when [facility] tried to call her.  She felt like they would call her back and they tried again while she was still on the phone.  She then attempted to call them back but the line went to an answering service so she hung up.  She reports shortly afterwards a police officer came to her door and said that she was checking on her to make sure she was still alive.  Based on notes cardiology was attempting to contact the patient because last night at 5 PM she had a episode of ventricular tachycardia for approximately 7 minutes.  Patient reports that she has taken herself off of metformin and has not been taking metoprolol  because it gives her too much diarrhea but reports other than that has been compliant with her medications.  The history is provided by the patient and medical records.  Loss of Consciousness      Home Medications Prior to Admission medications   Medication Sig Start Date End Date Taking? Authorizing Provider  aspirin 81 MG chewable tablet Chew 1 tablet (81 mg total) by mouth daily. 01/03/23   Abagail Kitchens, PA-C  atorvastatin (LIPITOR) 80 MG tablet Take 80 mg by mouth at bedtime. 04/18/22   [provider]  clopidogrel (PLAVIX) 75 MG tablet Take 1 tablet (75 mg total) by mouth daily. 01/02/23   Yvonna Alanis L, PA-C  FARXIGA 10 MG TABS tablet Take 10 mg by mouth daily. 03/14/22   [provider]  furosemide (LASIX) 20 MG tablet Take 1 tablet (20 mg total) by mouth daily. May take extra 20 mg as needed for fluid 01/24/23   Andrey Farmer, PA-C  gabapentin (NEURONTIN) 600 MG tablet Take 600 mg by mouth 2 (two) times daily. 08/17/22   [provider]  magnesium oxide (MAG-OX) 400 MG tablet Take 400 mg by mouth daily.    [provider]  metFORMIN (GLUCOPHAGE-XR) 500 MG 24 hr tablet Take 500 mg by mouth daily with breakfast. 03/14/22   [provider]  metoprolol succinate (TOPROL-XL)  50 MG 24 hr tablet Take 1 tablet (50 mg total) by mouth 2 (two) times daily. 06/24/23   Sabharwal, Eliezer Lofts, DO  Multiple Vitamin (MULTIVITAMIN) tablet Take 1 tablet by mouth daily. Patient not taking: Reported on 06/09/2023    [provider]  nitroGLYCERIN (NITROSTAT) 0.4 MG SL tablet Place 1 tablet (0.4 mg total) under the tongue every 5 (five) minutes x 3 doses as needed for chest pain. 01/02/23   Abagail Kitchens, PA-C  ONETOUCH ULTRA test strip USE TO CHECK BLOOD SUGARS ONCE DAILY AND AS NEEDED 01/24/23   [provider]  OZEMPIC, 1 MG/DOSE, 4 MG/3ML SOPN Inject 1 mg into the skin once a week. 12/01/22   [provider]  pantoprazole (PROTONIX)  20 MG tablet Take 1 tablet (20 mg total) by mouth daily. 09/10/22   Pyrtle, Carie Caddy, MD  sacubitril-valsartan (ENTRESTO) 97-103 MG Take 1 tablet by mouth 2 (two) times daily. 03/06/23   Sabharwal, Aditya, DO  spironolactone (ALDACTONE) 25 MG tablet Take 1 tablet (25 mg total) by mouth daily. 01/24/23   Andrey Farmer, PA-C  TRESIBA FLEXTOUCH 200 UNIT/ML SOPN Take 80 Units by mouth at bedtime.  10/12/18   [provider]      Allergies    Patient has no known allergies.    Review of Systems   Review of Systems  Cardiovascular:  Positive for syncope.    Physical Exam Updated Vital Signs BP (!) 138/98   Pulse 75   Temp 98.1 F (36.7 C) (Oral)   Resp 17   Ht 5\' 6"  (1.676 m)   Wt 82.1 kg   SpO2 99%   BMI 29.21 kg/m  Physical Exam Vitals and nursing note reviewed.  Constitutional:      General: She is not in acute distress.    Appearance: She is well-developed.  HENT:     Head: Normocephalic and atraumatic.  Eyes:     Pupils: Pupils are equal, round, and reactive to light.  Cardiovascular:     Rate and Rhythm: Normal rate and regular rhythm.     Heart sounds: Normal heart sounds. No murmur heard.    No friction rub.  Pulmonary:     Effort: Pulmonary effort is normal.     Breath sounds: Normal breath sounds. No wheezing or rales.  Abdominal:     General: Bowel sounds are normal. There is no distension.     Palpations: Abdomen is soft.     Tenderness: There is no abdominal tenderness. There is no guarding or rebound.  Musculoskeletal:        General: No tenderness. Normal range of motion.     Right lower leg: No edema.     Left lower leg: No edema.     Comments: No edema  Skin:    General: Skin is warm and dry.     Findings: No rash.  Neurological:     Mental Status: She is alert and oriented to person, place, and time.     Cranial Nerves: No cranial nerve deficit.  Psychiatric:        Behavior: Behavior normal.     ED Results / Procedures / Treatments    Labs (all labs ordered are listed, but only abnormal results are displayed) Labs Reviewed  CBC - Abnormal; Notable for the following components:      Result Value   RBC 5.19 (*)    All other components within normal limits  COMPREHENSIVE METABOLIC PANEL - Abnormal; Notable  for the following components:   Potassium 2.9 (*)    BUN 7 (*)    Creatinine, Ser 1.01 (*)    AST 13 (*)    Total Bilirubin 1.7 (*)    GFR, Estimated 57 (*)    All other components within normal limits  CBG MONITORING, ED - Abnormal; Notable for the following components:   Glucose-Capillary 116 (*)    All other components within normal limits  BASIC METABOLIC PANEL  CBC  CBC  CREATININE, SERUM  MAGNESIUM  TROPONIN I (HIGH SENSITIVITY)  TROPONIN I (HIGH SENSITIVITY)    EKG EKG Interpretation Date/Time:  Friday June 27 2023 14:33:02 EST Ventricular Rate:  82 PR Interval:  196 QRS Duration:  118 QT Interval:  470 QTC Calculation: 549 R Axis:   -13  Text Interpretation: Normal sinus rhythm Left ventricular hypertrophy with QRS widening and repolarization abnormality ( Cornell product ) Septal infarct , age undetermined new Prolonged QT When compared with ECG of 09-May-2023 09:36, PREVIOUS ECG IS PRESENT Confirmed by Gwyneth Sprout ([id number]) on 06/27/2023 6:09:35 PM  Radiology DG Chest 2 View  Result Date: 06/27/2023 CLINICAL DATA:  Syncope EXAM: CHEST - 2 VIEW COMPARISON:  Chest x-ray 01/06/2023 FINDINGS: Loop recorder device overlies the left anterior chest. The heart is enlarged. The lungs and costophrenic angles are clear. There is no pneumothorax or acute fracture. IMPRESSION: Cardiomegaly. No acute cardiopulmonary process. Electronically Signed   By: Darliss Cheney M.D.   On: 06/27/2023 17:24    Procedures Procedures    Medications Ordered in ED Medications  acetaminophen (TYLENOL) tablet 650 mg (has no administration in time range)  ondansetron (ZOFRAN) injection 4 mg (has no  administration in time range)  heparin injection 5,000 Units (has no administration in time range)  potassium chloride 10 mEq in 100 mL IVPB (has no administration in time range)  potassium chloride SA (KLOR-CON M) CR tablet 20 mEq (has no administration in time range)    ED Course/ Medical Decision Making/ A&P                                 Medical Decision Making Amount and/or Complexity of Data Reviewed External Data Reviewed: notes. Labs: ordered. Decision-making details documented in ED Course. Radiology: ordered and independent interpretation performed. Decision-making details documented in ED Course. ECG/medicine tests: ordered and independent interpretation performed. Decision-making details documented in ED Course.  Risk Decision regarding hospitalization.   Pt with multiple medical problems and comorbidities and presenting today with a complaint that caries a high risk for morbidity and mortality.  Here today after a syncopal event yesterday.  Patient had some prodromal symptoms but has otherwise felt fine.  No complaints today however patient has a loop recorder and cardiology was alerted that patient had a 7-minute run of V. tach which correlates with her syncopal event.  She was instructed to come directly to the hospital this evening.  On exam here patient is well-appearing has no complaints.  I independently interpreted patient's EKG and labs.  EKG with a prolonged QT which is new but otherwise no other changes.  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  Troponin is normal today at 3, CBC with normal white count and hemoglobin, CMP with hypokalemia today of 2.9 but normal renal function.  Patient given IV potassium.  Will consult cardiology for further recommendations. I have independently visualized and interpreted pt's images  today.  Chest x-ray without acute findings.  CRITICAL CARE Performed by: Jhene Westmoreland Total critical care time: 30  minutes Critical care time was exclusive of separately billable procedures and treating other patients. Critical care was necessary to treat or prevent imminent or life-threatening deterioration. Critical care was time spent personally by me on the following activities: development of treatment plan with patient and/or surrogate as well as nursing, discussions with consultants, evaluation of patient's response to treatment, examination of patient, obtaining history from patient or surrogate, ordering and performing treatments and interventions, ordering and review of laboratory studies, ordering and review of radiographic studies, pulse oximetry and re-evaluation of patient's condition.           Final Clinical Impression(s) / ED Diagnoses Final diagnoses:  Ventricular tachycardia seen on cardiac monitor [facility])  Syncope and collapse  Hypokalemia    Rx / DC Orders ED Discharge Orders     None         Gwyneth Sprout, MD 06/27/23 1906

## 2023-06-27 NOTE — H&P (Addendum)
Cardiology Admission History and Physical :   Patient ID: Paula Wong; MRN: [id number]; DOB: April 04, 1946   Admission date: 06/27/2023  Primary Care Provider: Loura Back, NP Primary Cardiologist: Dr. Gasper Lloyd Primary Electrophysiologist:  Dr. Lalla Brothers (ILR)  Chief Complaint:  Sustained Ventricular tachycardia  Patient Profile:   Paula Wong is a 77 y.o. female with sustained VT  History of Present Illness:   [name], a 77 year old with a complex cardiac history including coronary artery disease, heart failure, ventricular tachycardia, diabetes, stroke, hypertension, and hyperlipidemia, was admitted to the hospital in 2023. She was found to have multi-vessel coronary disease, which was treated medically. She also experienced polymorphic ventricular tachycardia, thought to be ischemic in nature, and was started on IV amiodarone. She underwent PCI to her mid LAD. Subsequently, she was readmitted for heart failure and hypoxic respiratory failure.  She was last seen by her advanced heart failure doctor in September 2020, at which time she was euvolemic and tolerating goal-directed medical therapy. She reports feeling well overall. However, she experienced seven minutes of ventricular tachycardia the previous night, with two episodes of ventricular tachycardia and a third of atrial fibrillation. She only has an ILR and thus her device cannot act.  She is NYHA class 1-2. She is on Lasix 20mg , high dose Entresto, and metoprolol 50mg .  She notes that she had   She also has a history of stroke and diabetes. Her diabetes is managed with Ozempic and insulin, and she is also on metformin. She reports feeling great overall, with no chest pain, shortness of breath, or palpitations. She lives independently and is generally active. She has had a cardiac MRI in the past, which showed a fair amount of scar tissue per report.  Her VT max was 264.  She has also have rate controlled AF.  In the  interim she has had hypokalemia with a K of 2.9.  Allergies:   No Known Allergies  Social History:   Social History   Socioeconomic History   Marital status: Single    Spouse name: Not on file   Number of children: 2   Years of education: Not on file   Highest education level: High school graduate  Occupational History   Occupation: Retired  Tobacco Use   Smoking status: Former    Current packs/day: 0.00    Types: Cigarettes    Quit date: 08/20/2003    Years since quitting: 19.8   Smokeless tobacco: Never  Vaping Use   Vaping status: Never Used  Substance and Sexual Activity   Alcohol use: No   Drug use: No   Sexual activity: Not on file  Other Topics Concern   Not on file  Social History Narrative   Not on file   Social Determinants of Health   Financial Resource Strain: Low Risk  (01/07/2023)   Overall Financial Resource Strain (CARDIA)    Difficulty of Paying Living Expenses: Not hard at all  Food Insecurity: No Food Insecurity (01/07/2023)   Hunger Vital Sign    Worried About Running Out of Food in the Last Year: Never true    Ran Out of Food in the Last Year: Never true  Transportation Needs: No Transportation Needs (01/07/2023)   PRAPARE - Administrator, Civil Service (Medical): No    Lack of Transportation (Non-Medical): No  Physical Activity: Not on file  Stress: Not on file  Social Connections: Not on file  Intimate Partner Violence: Not At Risk (01/07/2023)  Humiliation, Afraid, Rape, and Kick questionnaire    Fear of Current or Ex-Partner: No    Emotionally Abused: No    Physically Abused: No    Sexually Abused: No    Family History:   The patient's family history includes Cancer in her sister; Colon cancer (age of onset: 45) in her mother; Heart attack in her brother and father. There is no history of Stomach cancer, Pancreatic cancer, or Esophageal cancer.    ROS:  Please see the history of present illness.   Physical Exam/Data:    Vitals:   06/27/23 1436 06/27/23 1442 06/27/23 1815 06/27/23 1817  BP: 129/71  (!) 138/98   Pulse: 83  75   Resp: 14  17   Temp: 98.9 F (37.2 C)   98.1 F (36.7 C)  TempSrc: Oral   Oral  SpO2: 98%  99%   Weight:  82.1 kg    Height:  5\' 6"  (1.676 m)     No intake or output data in the 24 hours ending 06/27/23 1919 Filed Weights   06/27/23 1442  Weight: 82.1 kg   Body mass index is 29.21 kg/m.   Gen: no distress  Neck: No JVD, Cardiac: No Rubs or Gallops, no murmur, IRIR with distant heart sounds. +2  radial pulses Respiratory: Clear to auscultation bilaterally, normal effort, normal  respiratory rate GI: Soft, nontender, non-distended  MS: No  edema;  moves all extremities Integument: Skin feels warm Neuro:  At time of evaluation, alert and oriented to person/place/time/situation  Psych: Normal affect, patient feels fine  EKG:  The ECG that was done  was personally reviewed and demonstrates SR septal infarct with LVH QTc: 549 ms seconday to reciprocal changes  Relevant CV Studies:  Cardiac Studies & Procedures   CARDIAC CATHETERIZATION  CARDIAC CATHETERIZATION 12/31/2022  Narrative   Mid Cx lesion is 5% stenosed.   Ost LAD to Prox LAD lesion is 40% stenosed.   Mid LAD-2 lesion is 30% stenosed.   Ost LM lesion is 40% stenosed.   Mid LM to Dist LM lesion is 30% stenosed.   RV Branch lesion is 70% stenosed.   Prox LAD to Mid LAD lesion is 85% stenosed.   Mid LAD-1 lesion is 85% stenosed.   A drug-eluting stent was successfully placed using a SYNERGY XD 3.0X24.   Post intervention, there is a 0% residual stenosis.   Post intervention, there is a 0% residual stenosis.   LV end diastolic pressure is moderately elevated.  2 vessel obstructive CAD with severe LAD stenosis sequentially with heavy calcification. Moderate RCA disease diffusely in a small branch Moderately elevated LVEDP 25 mm Hg Successful PCI of the mid LAD using IVUS guidance, Shockwave intracoronary  lithotripsy and DES x 1.  Plan: DAPT for one year. Anticipate possible DC tomorrow.  Findings Coronary Findings Diagnostic  Dominance: Right  Left Main Ost LM lesion is 40% stenosed. Mid LM to Dist LM lesion is 30% stenosed.  Left Anterior Descending Ost LAD to Prox LAD lesion is 40% stenosed. Prox LAD to Mid LAD lesion is 85% stenosed. The lesion is severely calcified. Ultrasound (IVUS) was performed. Severe plaque burden was detected. IVUS has determined that the lesion is calcified. Mid LAD-1 lesion is 85% stenosed. The lesion is severely calcified. Ultrasound (IVUS) was performed. Severe plaque burden was detected. IVUS has determined that the lesion is calcified. Mid LAD-2 lesion is 30% stenosed.  Left Anterior Descending Ost LAD to Prox LAD lesion is 40% stenosed.  Prox LAD to Mid LAD lesion is 85% stenosed. The lesion is severely calcified. Ultrasound (IVUS) was performed. Severe plaque burden was detected. IVUS has determined that the lesion is calcified. Mid LAD-1 lesion is 85% stenosed. The lesion is severely calcified. Ultrasound (IVUS) was performed. Severe plaque burden was detected. IVUS has determined that the lesion is calcified. Mid LAD-2 lesion is 30% stenosed.  Left Circumflex Mid Cx lesion is 5% stenosed.  Right Coronary Artery  Right Ventricular Branch RV Branch lesion is 70% stenosed.  Intervention  Prox LAD to Mid LAD lesion Stent (Also treats lesions: Mid LAD-1) CATH VISTA GUIDE 6FR XBLAD3.5 guide catheter was inserted. Lesion crossed with guidewire using a WIRE ASAHI PROWATER 180CM. Pre-stent angioplasty was performed using a BALLN EMERGE MR 2.5X12. A drug-eluting stent was successfully placed using a SYNERGY XD 3.0X24. Stent strut is well apposed. Post-stent angioplasty was performed using a BALL SAPPHIRE NC24 3.25X15. Maximum pressure:  18 atm. Intracoronary lithotripsy with 3.0 mm Shockwave balloon. Post-Intervention Lesion Assessment The intervention  was successful. Pre-interventional TIMI flow is 3. Post-intervention TIMI flow is 3. No complications occurred at this lesion. Ultrasound (IVUS) was performed on the lesion post PCI using a CATH OPTICROSS HD. Stent well apposed. There is a 0% residual stenosis post intervention.  Mid LAD-1 lesion Stent (Also treats lesions: Prox LAD to Mid LAD) See details in Prox LAD to Mid LAD lesion. Post-Intervention Lesion Assessment The intervention was successful. Pre-interventional TIMI flow is 3. Post-intervention TIMI flow is 3. No complications occurred at this lesion. There is a 0% residual stenosis post intervention.  Prox LAD to Mid LAD lesion Stent (Also treats lesions: Mid LAD-1) See details in Prox LAD to Mid LAD lesion. Post-Intervention Lesion Assessment See details in Prox LAD to Mid LAD lesion. There is a 0% residual stenosis post intervention.  Mid LAD-1 lesion Stent (Also treats lesions: Prox LAD to Mid LAD) See details in Prox LAD to Mid LAD lesion. Post-Intervention Lesion Assessment See details in Mid LAD-1 lesion. There is a 0% residual stenosis post intervention.   CARDIAC CATHETERIZATION  CARDIAC CATHETERIZATION 04/16/2022  Narrative   Ost LM lesion is 40% stenosed.   Mid LM to Dist LM lesion is 30% stenosed.   Ost LAD to Prox LAD lesion is 40% stenosed.   Mid LAD-1 lesion is 80% stenosed.   Mid LAD-2 lesion is 30% stenosed.   RV Branch lesion is 70% stenosed.   Mid Cx lesion is 5% stenosed.  Significant multi vessel coronary calcification.  The left main has 40% ostial 30% distal stenosis; the LAD has 40% ostial to proximal stenosis followed by focal calcified 80% stenosis prior to the takeoff of a septal perforator and the second diagonal vessel with 30% mid stenosis; mild nonobstructive disease in the left circumflex vessel, and diffuse 70% stenosis in the marginal branch of the RCA.  LVEDP is elevated at 22 mmHg.  RECOMMENDATION: Patient has significant LV  dysfunction out of proportion to her or significant LAD stenosis.  She has been without anginal symptomatology.  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  Consider addition of anti-ischemic medical therapy with 80% LAD stenosis.  Will ask colleagues to review.  If patient has increasing symptomatology consider PCI to LAD with possible shockwave or CSI atherectomy if needed.  Findings Coronary Findings Diagnostic  Dominance: Right  Left Main Ost LM lesion is 40% stenosed. Mid LM to Dist LM lesion is 30%  stenosed.  Left Anterior Descending Ost LAD to Prox LAD lesion is 40% stenosed. Mid LAD-1 lesion is 80% stenosed. Mid LAD-2 lesion is 30% stenosed.  Left Anterior Descending Ost LAD to Prox LAD lesion is 40% stenosed. Mid LAD-1 lesion is 80% stenosed. Mid LAD-2 lesion is 30% stenosed.  Left Circumflex Mid Cx lesion is 5% stenosed.  Right Coronary Artery  Right Ventricular Branch RV Branch lesion is 70% stenosed.  Intervention  No interventions have been documented.     ECHOCARDIOGRAM  ECHOCARDIOGRAM COMPLETE 12/06/2022  Narrative ECHOCARDIOGRAM REPORT    Patient Name:   DEBORAHH CALLARD Date of Exam: 12/06/2022 Medical Rec #:  [id number]        Height:       65.0 in Accession #:    [id number]       Weight:       183.8 lb Date of Birth:  06-19-1946         BSA:          1.908 m Patient Age:    77 years         BP:           132/88 mmHg Patient Gender: F                HR:           89 bpm. Exam Location:  [address]  Procedure: 2D Echo, Color Doppler and Cardiac Doppler  Indications:    I50.42 CHF  History:        Patient has prior history of Echocardiogram examinations, most recent 04/13/2022. CHF and Chronic combine CHF, CAD and CAD-moderate CAD; Risk Factors:Diabetes, Hypertension, Dyslipidemia and Former Smoker. Previous echo revealed LVEF 25%, mild LVH, global hypokinesis.  Sonographer:    Chanetta Marshall BA,  RDCS Referring Phys: [phone] PHILIP J NAHSER  IMPRESSIONS   1. Left ventricular ejection fraction, by estimation, is 25 to 30%. The left ventricle has severely decreased function. The left ventricle demonstrates global hypokinesis. The left ventricular internal cavity size was moderately dilated. Left ventricular diastolic parameters are indeterminate. 2. Right ventricular systolic function is normal. The right ventricular size is normal. 3. The mitral valve is normal in structure. Mild to moderate mitral valve regurgitation. No evidence of mitral stenosis. 4. The aortic valve is tricuspid. There is mild calcification of the aortic valve. Aortic valve regurgitation is mild. Aortic valve sclerosis is present, with no evidence of aortic valve stenosis. 5. The inferior vena cava is normal in size with greater than 50% respiratory variability, suggesting right atrial pressure of 3 mmHg.  FINDINGS Left Ventricle: Left ventricular ejection fraction, by estimation, is 25 to 30%. The left ventricle has severely decreased function. The left ventricle demonstrates global hypokinesis. The left ventricular internal cavity size was moderately dilated. There is no left ventricular hypertrophy. Left ventricular diastolic parameters are indeterminate.   LV Wall Scoring: The inferior wall is akinetic.  Right Ventricle: The right ventricular size is normal. No increase in right ventricular wall thickness. Right ventricular systolic function is normal.  Left Atrium: Left atrial size was normal in size.  Right Atrium: Right atrial size was normal in size.  Pericardium: There is no evidence of pericardial effusion.  Mitral Valve: The mitral valve is normal in structure. Mild to moderate mitral valve regurgitation. No evidence of mitral valve stenosis.  Tricuspid Valve: The tricuspid valve is normal in structure. Tricuspid valve regurgitation is not demonstrated. No evidence of tricuspid stenosis.  Aortic  Valve: The  aortic valve is tricuspid. There is mild calcification of the aortic valve. Aortic valve regurgitation is mild. Aortic valve sclerosis is present, with no evidence of aortic valve stenosis.  Pulmonic Valve: The pulmonic valve was normal in structure. Pulmonic valve regurgitation is not visualized. No evidence of pulmonic stenosis.  Aorta: The aortic root is normal in size and structure.  Venous: The inferior vena cava is normal in size with greater than 50% respiratory variability, suggesting right atrial pressure of 3 mmHg.  IAS/Shunts: No atrial level shunt detected by color flow Doppler.   LEFT VENTRICLE PLAX 2D LVIDd:         6.40 cm LVIDs:         5.70 cm LV PW:         1.10 cm LV IVS:        0.80 cm LVOT diam:     2.40 cm LV SV:         40 LV SV Index:   21 LVOT Area:     4.52 cm   RIGHT VENTRICLE             IVC RV Basal diam:  2.90 cm     IVC diam: 1.05 cm RV Mid diam:    0.90 cm RV S prime:     11.75 cm/s TAPSE (M-mode): 1.2 cm  LEFT ATRIUM             Index        RIGHT ATRIUM          Index LA diam:        4.00 cm 2.10 cm/m   RA Area:     9.20 cm LA Vol (A2C):   40.9 ml 21.43 ml/m  RA Volume:   20.20 ml 10.58 ml/m LA Vol (A4C):   36.7 ml 19.23 ml/m LA Biplane Vol: 38.5 ml 20.17 ml/m AORTIC VALVE LVOT Vmax:   56.27 cm/s LVOT Vmean:  35.433 cm/s LVOT VTI:    0.089 m  AORTA Ao Root diam: 3.10 cm Ao Asc diam:  3.60 cm   SHUNTS Systemic VTI:  0.09 m Systemic Diam: 2.40 cm  Donato Schultz MD Electronically signed by Donato Schultz MD Signature Date/Time: 12/06/2022/1:50:10 PM    Final     CT SCANS  CT CORONARY MORPH W/CTA COR W/SCORE 04/15/2022  Addendum 04/16/2022  9:42 AM ADDENDUM REPORT: 04/16/2022 09:40  EXAM: OVER-READ INTERPRETATION  CT CHEST  The following report is a limited chest CT over-read performed by radiologist Dr. Irma Newness [facility] on 04/16/2022. This over-read does not include interpretation of  cardiac or coronary anatomy or pathology. The coronary CTA interpretation by the cardiologist is attached.  COMPARISON:  None.  FINDINGS: Mediastinum/Nodes: No enlarged lymph nodes within the visualized mediastinum.  Lungs/Pleura: There is no pleural effusion. The visualized lungs appear clear.  Upper abdomen: No significant findings in the visualized upper abdomen.  Musculoskeletal/Chest wall: No chest wall mass or suspicious osseous findings within the visualized chest.  IMPRESSION: No significant extracardiac findings within the visualized chest.   Electronically Signed By: Carey Bullocks M.D. On: 04/16/2022 09:40  Narrative CLINICAL DATA:  77 Year old African American Female  EXAM: Cardiac/Coronary  CTA  TECHNIQUE: The patient was scanned on a [employer].  FINDINGS: Scan was triggered in the descending thoracic aorta. Axial non-contrast 3 mm slices were carried out through the heart. The data set was analyzed on a dedicated work station and scored using the Agatson method.  Gantry rotation speed was 250 msecs and collimation was .6 mm. 0.8 mg of sl NTG was given. The 3D data set was reconstructed in 5% intervals of the 67-82 % of the R-R cycle. Diastolic phases were analyzed on a dedicated work station using MPR, MIP and VRT modes. The patient received 95 cc of contrast.  Coronary Arteries:  Normal coronary origin.  Co-dominance.  Coronary Calcium Score:  Left main: 896  Left anterior descending artery: 1050  Left circumflex artery: 304  Right coronary artery: 152  Ramus intermedius artery: 150  Total: 2553  Percentile: 99th for age, sex, and race matched control.  RCA is an artery that gives rise to PDA and PLA. Minimal calcified plaque (1-24%) in the ostial RCA. Mild soft plaque (25-49%) mid RCA. Minimal calcified plaque (1-24%) in the distal RCA.  Left main is a large artery that gives rise to LAD, RI, and LCX arteries. Mild  calcified plaque (25-49%) proximal left main.  LAD is a large vessel that gives rise to two diagonal vessels. Moderate stenosis, calcified plaque, (50-70%) ostial LAD. Moderate stenosis, calcified plaque, (50-70%) proximal LAD. There are two severe mixed plaque stenosis (70-99%) in the mid LAD. Severe mixed plaque stenosis (70-99%) distal LAD  LCX is a co-dominant artery that gives rise to multiple OM vessels and a small L-PDA. Mild calcified plaque (25-49%) proximal RCA.  Ramus intermedius artery with mild, mixed plaque in the proximal vessel then a severe mixed plaque stenosis (70-99%) in the mid body. Vessel is diffusely diseased.  Other findings:  Aorta: Normal size.  Aortic atherosclerosis.  No dissection.  Main Pulmonary Artery: Severe dilation 35 mm.  Aortic Valve:  Tri-leaflet.  No calcifications.  Normal pulmonary vein drainage into the left atrium.  Normal left atrial appendage without a thrombus.  Small PFO.  Severe left ventricular dilation.  Small pericardial effusion anterior to the right ventricle an lateral to the left ventricle  Extra-cardiac findings: See attached radiology report for non-cardiac structures.  IMPRESSION: 1. Coronary calcium score of 2553. This was 99th percentile for age, sex, and race matched control.  2. Normal coronary origin with co-dominance.  3. CAD-RADS 4 Severe stenosis. (70-99% or > 50% left main). CT FFR is will be sent. Consider symptom-guided anti-ischemic pharmacotherapy as well as risk factor modification per guideline directed care.  4.  Aortic atherosclerosis.  5. Severe left ventricular dilation.  RECOMMENDATIONS:  Coronary artery calcium (CAC) score is a strong predictor of incident coronary heart disease (CHD) and provides predictive information beyond traditional risk factors. CAC scoring is reasonable to use in the decision to withhold, postpone, or initiate statin therapy in intermediate-risk or  selected borderline-risk asymptomatic adults (age 83-75 years and LDL-C >=70 to <190 mg/dL) who do not have diabetes or established atherosclerotic cardiovascular disease (ASCVD).* In intermediate-risk (10-year ASCVD risk >=7.5% to <20%) adults or selected borderline-risk (10-year ASCVD risk >=5% to <7.5%) adults in whom a CAC score is measured for the purpose of making a treatment decision the following recommendations have been made:  If CAC = 0, it is reasonable to withhold statin therapy and reassess in 5 to 10 years, as long as higher risk conditions are absent (diabetes mellitus, family history of premature CHD in first degree relatives (males <55 years; females <65 years), cigarette smoking, LDL >=190 mg/dL or other independent risk factors).  If CAC is 1 to 99, it is reasonable to initiate statin therapy for patients >=88 years of age.  If CAC is >=100 or >=  75th percentile, it is reasonable to initiate statin therapy at any age.  Cardiology referral should be considered for patients with CAC scores =400 or >=75th percentile.  *2018 AHA/ACC/AACVPR/AAPA/ABC/ACPM/ADA/AGS/APhA/ASPC/NLA/PCNA Guideline on the Management of Blood Cholesterol: A Report of the American College of Cardiology/American Heart Association Task Force on Clinical Practice Guidelines. J Am Coll Cardiol. 2019;73(24):3168-3209.  Riley Lam, MD  Electronically Signed: By: Riley Lam M.D. On: 04/15/2022 16:46   CARDIAC MRI  MR CARDIAC MORPHOLOGY W WO CONTRAST 05/19/2023  Narrative CLINICAL DATA:  Ischemic cardiomyopathy  EXAM: CARDIAC MRI  TECHNIQUE: The patient was scanned on a 1.5 Tesla GE magnet. A dedicated cardiac coil was used. Functional imaging was done using Fiesta sequences. 2,3, and 4 chamber views were done to assess for RWMA's. Modified Simpson's rule using a short axis stack was used to calculate an ejection fraction on a dedicated work [employer]. The patient received 8 cc of Gadavist. After 10 minutes inversion recovery sequences were used to assess for infiltration and scar tissue.  FINDINGS: Limited images of the lung fields showed no gross abnormalities.  Moderately dilated left ventricle with normal wall thickness. Extensive wall motion abnormalities noted with LV EF 21%. Inferolateral akinesis. Basal to mid inferior and anterior severe hypokinesis. Mid anterolateral akinesis. Septal hypokinesis. Function is relatively preserved at the apex. Normal right ventricular size with mildly decreased systolic function RV EF 45%. Mild left atrial enlargement, normal right atrium. Trileaflet aortic valve, no significant stenosis with trivial regurgitation (regurgitant fraction 6%). Mild mitral regurgitation with regurgitant fraction 17%.  DELAYED ENHANCEMENT: DELAYED ENHANCEMENT Mid-wall late gadolinium enhancement (LGE) in the basal anteroseptum and basal to mid anterior wall.  Mid-wall LGE in the basal inferoseptum and basal inferior wall.  <50% wall thickness subendocardial LGE in the mid inferior wall.  <50% wall thickness subendocardial LGE in the basal to mid inferolateral wall.  MEASUREMENTS: MEASUREMENTS LVEDV 294 mL  LVEDVi 150 mL/m2  LVSV 63 mL LVEF 21%  RVEDV 113 mL RVEDVi 58 mL/m2  RVSV 51 mL RVEF 45%  Aortic forward volume 52 mL  Aortic regurgitant fraction 6%  T1 1163 ECV 32%  IMPRESSION: 1. Moderately dilated LV with wall motion abnormalities as noted above. LV EF 21%.  2.  Normal RV size with EF 45%.  3. Patient has known CAD, but LGE pattern does not completely fit an ischemic cardiomyopathy. There is mid-wall LGE in the basal anterior, septal, and inferior segments. This is not a typical coronary pattern and could be seen with myocarditis or infiltrative disease (amyloidosis, sarcoidosis). The subendocardial LGE in the inferolateral wall and mid inferior wall could be seen  with prior MI, but reviewing catheterization report, the coronaries supplying these territories do not have severe disease. Cardiac amyloidosis could be a consideration given age and ethnicity, but there is no significant LVH.  4. Mildly elevated extracellular volume percentage suggests increased myocardial fibrosis content.  Dalton Mclean   Electronically Signed By: Marca Ancona M.D. On: 05/20/2023 14:05         Laboratory Data:  Chemistry Recent Labs  Lab 06/27/23 1449  NA 143  K 2.9*  CL 107  CO2 25  GLUCOSE 99  BUN 7*  CREATININE 1.01*  CALCIUM 9.3  GFRNONAA 57*  ANIONGAP 11    Recent Labs  Lab 06/27/23 1449  PROT 7.2  ALBUMIN 3.6  AST 13*  ALT 10  ALKPHOS 64  BILITOT 1.7*   Hematology Recent Labs  Lab 06/27/23 1449  WBC 6.0  RBC 5.19*  HGB 13.9  HCT 43.6  MCV 84.0  MCH 26.8  MCHC 31.9  RDW 14.0  PLT 261   Cardiac EnzymesNo results for input(s): "TROPONINI" in the last 168 hours. No results for input(s): "TROPIPOC" in the last 168 hours.  BNPNo results for input(s): "BNP", "PROBNP" in the last 168 hours.  DDimer No results for input(s): "DDIMER" in the last 168 hours.   Assessment and Plan:   Ventricular Tachycardia Recurrent episodes, including a recent 7-minute episode. Likely ischemic in origin, previously treated with IV amiodarone and PCI to the mid LAD. Not on an antiarrhythmic drug. Decision to start amiodarone and hold off on ranolazine. QT prolongation (QTc 549 ms). Discussed need for cardiac MRI to identify potential scarring and possibility of an implantable defibrillator to prevent sudden cardiac death. - Admit to hospital, given telemetry runs of bigeminy and NSVT would recommend ICU tonight - Repeat echocardiogram in the morning (limited) - Consult electrophysiology in AM - Repeat EKG in the morning to confirm QTc - Increase beta blocker to 75 mg, at least temporarily (had issues with diarrhea on this dose) - Start  mexiletine given Qtc - though her diarrhea has resolved, K was 2.9; she is getting aggressive repletion and a Mg level is pending  Atrial Fibrillation - 7 minutes of AF. 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 Euvolemic and tolerating goal-directed medical therapy at last visit. Discussed continuation of current medications - Continue home Lasix 20 mg as PRN - Continue high dose Entresto - Continue metoprolol 75 mg - Start spironolactone 25 mg tomorrow  - Continue Farxiga 10 mg  Coronary Artery Disease - Prior PCI. On appropriate antiplatelet and lipid-lowering therapy. No changes to current regimen. - Continue Plavix - Continue aspirin - Continue Lipitor - Her PCI was distant, likely stop to just plavix and [facility] tomorrow unless RCA evaluation planned this admission (inferior non sub-endocardial LGE)  Hypertension - Managed with goal-directed medical therapy. No changes to current regimen. - Continue current antihypertensive regimen  Diabetes Mellitus Currently on insulin and metformin. Plan to hold metformin and continue insulin with sliding scale. Discussed need for glucose team support if worsening BG - Hold Ozempic - Hold metformin - Use sliding scale insulin - consider consult glucose team for support  General Health Maintenance None - Continue pantoprazole and home pain regimen  Follow-up - AHF patient; would benefit from seeing EP    Severity of Illness: The appropriate patient status for this patient is INPATIENT. Inpatient status is judged to be reasonable and necessary in order to provide the required intensity of service to ensure the patient's safety. The patient's presenting symptoms, physical exam findings, and initial radiographic and laboratory data in the context of their chronic comorbidities is felt to place them at high risk  for further clinical deterioration. Furthermore, it is not anticipated that the patient will be medically stable for discharge from the hospital within 2 midnights of admission.   * I certify that at the point of admission it is my clinical judgment that the patient will require inpatient hospital care spanning beyond 2 midnights from the point of admission due to high intensity of service, high risk for further deterioration and high frequency of surveillance required.*   For questions or updates, please contact [facility] Please consult [email or web address] for contact info under Cardiology/STEMI.   Riley Lam,  MD FASE [facility] Cardiologist [facility]  [address] [place], [address] [place], [place] [address] [phone]  7:19 PM

## 2023-06-27 NOTE — Telephone Encounter (Signed)
ILR alert for AF and tachy Events occurred 11/7 17:19, 17:20 and 17:36 First two events likely VT, the third labeled AF shows wide QRS, regular, tachy followed by junctional and then SR with PVC's Route to triage high alert LA, CVRS  Called all numbers listed. Reviewed w/ AT. Welfare check initiated w/ [place] [employer]

## 2023-06-27 NOTE — ED Notes (Signed)
ED TO INPATIENT HANDOFF REPORT  ED Nurse Name and Phone #:  Minerva Areola [phone]  S Name/Age/Gender Paula Wong 77 y.o. female Room/Bed: 001C/001C  Code Status   Code Status: Full Code  Home/SNF/Other Home Patient oriented to: self, place, time, and situation Is this baseline? Yes   Triage Complete: Triage complete  Chief Complaint Ventricular tachycardia [facility]) [I47.20]  Triage Note Pt states she was on the phone with her friend yesterday and passed out. Pt states she felt hot and went out. Pt states EMS was there and she did not go because she felt okay. Pt states a Emergency planning/management officer came to her house and they told her that [facility] thought she needed to come be seen. Pt states they said it was more than her blood sugar dropping because her defibrillator showed she should have been dead. Pt denies any complaints at this time.    Allergies No Known Allergies  Level of Care/Admitting Diagnosis ED Disposition     ED Disposition  Admit   Condition  --   Comment  Hospital [facility]: [facility] [[id number]]  Level of Care: ICU [6]  May admit patient to [facility] or [facility] if equivalent level of care is available:: No  Covid Evaluation: Asymptomatic - no recent exposure (last 10 days) testing not required  Diagnosis: Ventricular tachycardia [facility]) [[id number]]  Admitting Physician: Christell Constant [[id number]]  Attending Physician: Christell Constant [[id number]]  Certification:: I certify this patient will need inpatient services for at least 2 midnights          B Medical/Surgery History Past Medical History:  Diagnosis Date   Allergy    CAD (coronary artery disease)    a. 03/2022 Cath: LM 40ost, 56m, LAD 40ost/p, 59m, 44m, LCX 47m, RCA nl, RV branch 70-->Med rx.   Chronic HFrEF (heart failure with reduced ejection fraction) (HCC)    a. 11/2021 Echo: EF 20-25%; b. 03/2022 Echo: EF 20-25%; c. 11/2022 Echo: EF 25-30%, glob HK.   Diabetes (HCC)     Diverticulosis    GERD (gastroesophageal reflux disease)    Hiatal hernia    Hyperlipidemia    Hypertension    Implantable loop recorder present    a. 03/2022 s/p Abbott IQ EL+ (serial [id number]) ILR in setting of cryptogenic stroke.   Ischemic cardiomyopathy    a. 11/2021 Echo: EF 20-25%; b. 03/2022 Echo: EF 20-25%; c. 11/2022 Echo: EF 25-30%, glob HK, nl RV fxn, mild-mod MR, mild AI, AoV sclerosis.   Neuropathy    feet   Schatzki's ring    Stroke [facility])    a. 03/2022 MRI Brain: subacute cortical infarct involving inf and med L occipial pole w/ petechial hemorrhage; b. 03/2022 s/p Abbott Assert IQ EL ILR.   Ventricular tachycardia (HCC)    a. 12/2022 noted on ILR.   Past Surgical History:  Procedure Laterality Date   CORONARY LITHOTRIPSY N/A 12/31/2022   Procedure: CORONARY LITHOTRIPSY;  Surgeon: Swaziland, Peter M, MD;  Location: [facility] INVASIVE CV LAB;  Service: Cardiovascular;  Laterality: N/A;   CORONARY STENT INTERVENTION N/A 12/31/2022   Procedure: CORONARY STENT INTERVENTION;  Surgeon: Swaziland, Peter M, MD;  Location: [facility] INVASIVE CV LAB;  Service: Cardiovascular;  Laterality: N/A;   CORONARY ULTRASOUND/IVUS N/A 12/31/2022   Procedure: Coronary Ultrasound/IVUS;  Surgeon: Swaziland, Peter M, MD;  Location: [facility] INVASIVE CV LAB;  Service: Cardiovascular;  Laterality: N/A;   LEFT HEART CATH AND CORONARY ANGIOGRAPHY N/A 04/16/2022   Procedure: LEFT HEART CATH  AND CORONARY ANGIOGRAPHY;  Surgeon: Lennette Bihari, MD;  Location: [facility] INVASIVE CV LAB;  Service: Cardiovascular;  Laterality: N/A;   LEFT HEART CATH AND CORONARY ANGIOGRAPHY N/A 12/31/2022   Procedure: LEFT HEART CATH AND CORONARY ANGIOGRAPHY;  Surgeon: Swaziland, Peter M, MD;  Location: [facility] INVASIVE CV LAB;  Service: Cardiovascular;  Laterality: N/A;   LOOP RECORDER INSERTION N/A 04/15/2022   Procedure: LOOP RECORDER INSERTION;  Surgeon: Lanier Prude, MD;  Location: [facility] INVASIVE CV LAB;  Service: Cardiovascular;  Laterality: N/A;   MASS EXCISION Right  06/07/2019   Procedure: Dawson Bills OF KELOID TO RIGHT NECK;  Surgeon: Louisa Second, MD;  Location: [facility];  Service: Plastics;  Laterality: Right;   TOOTH EXTRACTION  2013     A IV Location/Drains/Wounds Patient Lines/Drains/Airways Status     Active Line/Drains/Airways     Name Placement date Placement time Site Days   Peripheral IV 06/27/23 22 G Left;Posterior Hand 06/27/23  1902  Hand  less than 1   Peripheral IV 06/27/23 20 G Right Antecubital 06/27/23  1919  Antecubital  less than 1   Wound / Incision (Open or Dehisced) 04/16/22 Incision - Open;Other (Comment) Wrist Lower;Right;Posterior 04/16/22  0929  Wrist  437            Intake/Output Last 24 hours No intake or output data in the 24 hours ending 06/27/23 1926  Labs/Imaging Results for orders placed or performed during the hospital encounter of 06/27/23 (from the past 48 hour(s))  POC CBG, ED     Status: Abnormal   Collection Time: 06/27/23  2:46 PM  Result Value Ref Range   Glucose-Capillary 116 (H) 70 - 99 mg/dL    Comment: Glucose reference range applies only to samples taken after fasting for at least 8 hours.  CBC     Status: Abnormal   Collection Time: 06/27/23  2:49 PM  Result Value Ref Range   WBC 6.0 4.0 - 10.5 K/uL   RBC 5.19 (H) 3.87 - 5.11 MIL/uL   Hemoglobin 13.9 12.0 - 15.0 g/dL   HCT 16.1 09.6 - 04.5 %   MCV 84.0 80.0 - 100.0 fL   MCH 26.8 26.0 - 34.0 pg   MCHC 31.9 30.0 - 36.0 g/dL   RDW 40.9 81.1 - 91.4 %   Platelets 261 150 - 400 K/uL   nRBC 0.0 0.0 - 0.2 %    Comment: Performed at [facility] Lab, [address] [place], [place] [address]  Troponin I (High Sensitivity)     Status: None   Collection Time: 06/27/23  2:49 PM  Result Value Ref Range   Troponin I (High Sensitivity) 3 <18 ng/L    Comment: (NOTE) Elevated high sensitivity troponin I (hsTnI) values and significant  changes across serial measurements may suggest ACS but many other  chronic and acute  conditions are known to elevate hsTnI results.  Refer to the "Links" section for chest pain algorithms and additional  guidance. Performed at [facility], [address] [place], [place] [address]   Comprehensive metabolic panel     Status: Abnormal   Collection Time: 06/27/23  2:49 PM  Result Value Ref Range   Sodium 143 135 - 145 mmol/L   Potassium 2.9 (L) 3.5 - 5.1 mmol/L   Chloride 107 98 - 111 mmol/L   CO2 25 22 - 32 mmol/L   Glucose, Bld 99 70 - 99 mg/dL    Comment: Glucose reference range applies  only to samples taken after fasting for at least 8 hours.   BUN 7 (L) 8 - 23 mg/dL   Creatinine, Ser 2.44 (H) 0.44 - 1.00 mg/dL   Calcium 9.3 8.9 - 01.0 mg/dL   Total Protein 7.2 6.5 - 8.1 g/dL   Albumin 3.6 3.5 - 5.0 g/dL   AST 13 (L) 15 - 41 U/L   ALT 10 0 - 44 U/L   Alkaline Phosphatase 64 38 - 126 U/L   Total Bilirubin 1.7 (H) <1.2 mg/dL   GFR, Estimated 57 (L) >60 mL/min    Comment: (NOTE) Calculated using the CKD-EPI Creatinine Equation (2021)    Anion gap 11 5 - 15    Comment: Performed at [facility], [address] [place], [place] [address]  Troponin I (High Sensitivity)     Status: None   Collection Time: 06/27/23  4:31 PM  Result Value Ref Range   Troponin I (High Sensitivity) 3 <18 ng/L    Comment: (NOTE) Elevated high sensitivity troponin I (hsTnI) values and significant  changes across serial measurements may suggest ACS but many other  chronic and acute conditions are known to elevate hsTnI results.  Refer to the "Links" section for chest pain algorithms and additional  guidance. Performed at [facility], [address], [place], [place] [address]    DG Chest 2 View  Result Date: 06/27/2023 CLINICAL DATA:  Syncope EXAM: CHEST - 2 VIEW COMPARISON:  Chest x-ray 01/06/2023 FINDINGS: Loop recorder device overlies the left anterior chest. The heart is enlarged. The lungs and costophrenic angles are clear. There is no pneumothorax or acute  fracture. IMPRESSION: Cardiomegaly. No acute cardiopulmonary process. Electronically Signed   By: Darliss Cheney M.D.   On: 06/27/2023 17:24    Pending Labs Unresulted Labs (From admission, onward)     Start     Ordered   06/28/23 0500  Basic metabolic panel  Daily,   R      06/27/23 1904   06/28/23 0500  CBC  Daily,   R      06/27/23 1904   06/27/23 1905  Magnesium  Once,   R        06/27/23 1904   06/27/23 1904  CBC  (Pharmacologic VTE prophylaxis)  Once,   R       Comments: Baseline for heparin therapy IF NOT ALREADY DRAWN.  Notify MD if PLT < 100 K.    06/27/23 1904   06/27/23 1904  Creatinine, serum  (Pharmacologic VTE prophylaxis)  Once,   R       Comments: Baseline for heparin therapy IF NOT ALREADY DRAWN.    06/27/23 1904            Vitals/Pain Today's Vitals   06/27/23 1442 06/27/23 1815 06/27/23 1817 06/27/23 1924  BP:  (!) 138/98    Pulse:  75    Resp:  17    Temp:   98.1 F (36.7 C)   TempSrc:   Oral   SpO2:  99%    Weight: 82.1 kg     Height: 5\' 6"  (1.676 m)     PainSc: 0-No pain   0-No pain    Isolation Precautions No active isolations  Medications Medications  acetaminophen (TYLENOL) tablet 650 mg (has no administration in time range)  ondansetron (ZOFRAN) injection 4 mg (has no administration in time range)  heparin injection 5,000 Units (has no administration in time range)  potassium chloride 10 mEq  in 100 mL IVPB (has no administration in time range)  potassium chloride SA (KLOR-CON M) CR tablet 20 mEq (has no administration in time range)  mexiletine (MEXITIL) capsule 150 mg (has no administration in time range)  metoprolol tartrate (LOPRESSOR) tablet 75 mg (has no administration in time range)  sacubitril-valsartan (ENTRESTO) 97-103 mg per tablet (has no administration in time range)  gabapentin (NEURONTIN) tablet 600 mg (has no administration in time range)  dapagliflozin propanediol (FARXIGA) tablet 10 mg (has no administration in time  range)  furosemide (LASIX) tablet 20 mg (has no administration in time range)  clopidogrel (PLAVIX) tablet 75 mg (has no administration in time range)  aspirin chewable tablet 81 mg (has no administration in time range)  atorvastatin (LIPITOR) tablet 80 mg (has no administration in time range)  magnesium oxide (MAG-OX) tablet 400 mg (has no administration in time range)  pantoprazole (PROTONIX) EC tablet 20 mg (has no administration in time range)  spironolactone (ALDACTONE) tablet 25 mg (has no administration in time range)  insulin aspart (novoLOG) injection 0-15 Units (has no administration in time range)    Mobility walks     Focused Assessments Cardiac Assessment Handoff:  Cardiac Rhythm: Normal sinus rhythm No results found for: "CKTOTAL", "CKMB", "CKMBINDEX", "TROPONINI" No results found for: "DDIMER" Does the Patient currently have chest pain? No    R Recommendations: See Admitting Provider Note  Report given to:   Additional Notes: cooperative NSR now. 2 IV's

## 2023-06-28 ENCOUNTER — Inpatient Hospital Stay (HOSPITAL_COMMUNITY): Payer: 59

## 2023-06-28 DIAGNOSIS — I4891 Unspecified atrial fibrillation: Secondary | ICD-10-CM

## 2023-06-28 DIAGNOSIS — I5022 Chronic systolic (congestive) heart failure: Secondary | ICD-10-CM | POA: Diagnosis not present

## 2023-06-28 DIAGNOSIS — I472 Ventricular tachycardia, unspecified: Secondary | ICD-10-CM | POA: Diagnosis not present

## 2023-06-28 LAB — HEPARIN LEVEL (UNFRACTIONATED)
Heparin Unfractionated: 0.68 [IU]/mL (ref 0.30–0.70)
Heparin Unfractionated: 0.68 [IU]/mL (ref 0.30–0.70)

## 2023-06-28 LAB — BASIC METABOLIC PANEL
Anion gap: 11 (ref 5–15)
BUN: 6 mg/dL — ABNORMAL LOW (ref 8–23)
CO2: 25 mmol/L (ref 22–32)
Calcium: 8.8 mg/dL — ABNORMAL LOW (ref 8.9–10.3)
Chloride: 105 mmol/L (ref 98–111)
Creatinine, Ser: 0.84 mg/dL (ref 0.44–1.00)
GFR, Estimated: >60 mL/min (ref >60)
Glucose, Bld: 101 mg/dL — ABNORMAL HIGH (ref 70–99)
Potassium: 3.8 mmol/L (ref 3.5–5.1)
Sodium: 141 mmol/L (ref 135–145)

## 2023-06-28 LAB — ECHOCARDIOGRAM LIMITED
Est EF: <20
Height: 66 in
S' Lateral: 6 cm
Weight: 2955.93 [oz_av]

## 2023-06-28 LAB — CBC
HCT: 42.6 % (ref 36.0–46.0)
Hemoglobin: 13.3 g/dL (ref 12.0–15.0)
MCH: 26 pg (ref 26.0–34.0)
MCHC: 31.2 g/dL (ref 30.0–36.0)
MCV: 83.4 fL (ref 80.0–100.0)
Platelets: 245 10*3/uL (ref 150–400)
RBC: 5.11 MIL/uL (ref 3.87–5.11)
RDW: 14.2 % (ref 11.5–15.5)
WBC: 4.1 10*3/uL (ref 4.0–10.5)
nRBC: 0 % (ref 0.0–0.2)

## 2023-06-28 LAB — GLUCOSE, CAPILLARY
Glucose-Capillary: 92 mg/dL (ref 70–99)
Glucose-Capillary: 157 mg/dL — ABNORMAL HIGH (ref 70–99)
Glucose-Capillary: 183 mg/dL — ABNORMAL HIGH (ref 70–99)
Glucose-Capillary: 95 mg/dL (ref 70–99)

## 2023-06-28 LAB — MAGNESIUM: Magnesium: 2 mg/dL (ref 1.7–2.4)

## 2023-06-28 MED ORDER — HEPARIN SODIUM (PORCINE) 5000 UNIT/ML IJ SOLN
5000.0000 [IU] | Freq: Three times a day (TID) | INTRAMUSCULAR | Status: DC
  Administered 2023-06-28 – 2023-06-29 (×3): 5000 [IU] via SUBCUTANEOUS
  Filled 2023-06-28 (×3): qty 1

## 2023-06-28 MED ORDER — POTASSIUM CHLORIDE 20 MEQ PO PACK
40.0000 meq | PACK | Freq: Two times a day (BID) | ORAL | Status: DC
  Administered 2023-06-28 – 2023-06-29 (×2): 40 meq via ORAL
  Filled 2023-06-28 (×2): qty 2

## 2023-06-28 MED ORDER — AMIODARONE HCL 200 MG PO TABS
400.0000 mg | ORAL_TABLET | Freq: Two times a day (BID) | ORAL | Status: DC
  Administered 2023-06-28 – 2023-07-01 (×7): 400 mg via ORAL
  Filled 2023-06-28 (×7): qty 2

## 2023-06-28 MED ORDER — PERFLUTREN LIPID MICROSPHERE
1.0000 mL | INTRAVENOUS | Status: AC | PRN
  Administered 2023-06-28: 2 mL via INTRAVENOUS
  Filled 2023-06-28: qty 10

## 2023-06-28 MED ORDER — CHLORHEXIDINE GLUCONATE CLOTH 2 % EX PADS
6.0000 | MEDICATED_PAD | Freq: Every day | CUTANEOUS | Status: DC
  Administered 2023-06-28 – 2023-06-30 (×3): 6 via TOPICAL

## 2023-06-28 NOTE — Consult Note (Signed)
ELECTROPHYSIOLOGY CONSULT NOTE    Patient ID: Paula Wong MRN: [id number], DOB/AGE: 01/24/1946 77 y.o.  Admit date: 06/27/2023 Date of Consult: 06/28/2023  Primary Physician: Loura Back, NP Primary Cardiologist: Kristeen Miss, MD  Electrophysiologist: Dr. Lalla Brothers   HPI:  Paula Wong is a 77 y.o. female with a known severe left ventricular dysfunction (EF 25 to 30% % by echocardiography with global dysfunction, most recently by echo on December 06, 2022) and multivessel coronary artery disease (40% ostial left main stenosis, 40% ostial LAD stenosis, 80% mid LAD stenosis, stenoses involving the origins of 2 large diagonal arteries, 70% RV branch stenosis), on a background of hypercholesterolemia, hypertension, history of remote stroke (August 2023). Patient had presentation to ED in May of 2024 after having VT on a loop recorder. She underwent LHC and was found to have LAD stenosis. She had been doing relatively well. On Thursday, 11/7, she had an episode of syncope.  She states that she was talking on the phone around 5 PM in the evening when she developed a warm sensation and suddenly lost consciousness.  Interrogation of her loop recorder shows sustained ventricular tachycardia during this time.  She did well after her syncopal event but was advised to come to the emergency room for evaluation.  Currently, she reports feeling relatively well with no new or acute complaints.  She denies chest pain, palpitations, dyspnea, PND, orthopnea, nausea, vomiting, dizziness, syncope, edema, weight gain, or early satiety.   Labs Potassium3.8 (11/09 0540) Magnesium  2.0 (11/08 1938) Creatinine, ser  0.84 (11/09 0540) PLT  245 (11/09 0540) HGB  13.3 (11/09 0540) WBC 4.1 (11/09 0540) Troponin I (High Sensitivity)3 (11/08 1631).    Past Medical History:  Diagnosis Date   Allergy    CAD (coronary artery disease)    a. 03/2022 Cath: LM 40ost, 59m, LAD 40ost/p, 47m, 62m, LCX 63m, RCA nl, RV  branch 70-->Med rx.   Chronic HFrEF (heart failure with reduced ejection fraction) (HCC)    a. 11/2021 Echo: EF 20-25%; b. 03/2022 Echo: EF 20-25%; c. 11/2022 Echo: EF 25-30%, glob HK.   Diabetes (HCC)    Diverticulosis    GERD (gastroesophageal reflux disease)    Hiatal hernia    Hyperlipidemia    Hypertension    Implantable loop recorder present    a. 03/2022 s/p Abbott IQ EL+ (serial [id number]) ILR in setting of cryptogenic stroke.   Ischemic cardiomyopathy    a. 11/2021 Echo: EF 20-25%; b. 03/2022 Echo: EF 20-25%; c. 11/2022 Echo: EF 25-30%, glob HK, nl RV fxn, mild-mod MR, mild AI, AoV sclerosis.   Neuropathy    feet   Schatzki's ring    Stroke [facility])    a. 03/2022 MRI Brain: subacute cortical infarct involving inf and med L occipial pole w/ petechial hemorrhage; b. 03/2022 s/p Abbott Assert IQ EL ILR.   Ventricular tachycardia (HCC)    a. 12/2022 noted on ILR.     Surgical History:  Past Surgical History:  Procedure Laterality Date   CORONARY LITHOTRIPSY N/A 12/31/2022   Procedure: CORONARY LITHOTRIPSY;  Surgeon: Swaziland, Peter M, MD;  Location: [facility] INVASIVE CV LAB;  Service: Cardiovascular;  Laterality: N/A;   CORONARY STENT INTERVENTION N/A 12/31/2022   Procedure: CORONARY STENT INTERVENTION;  Surgeon: Swaziland, Peter M, MD;  Location: [facility] INVASIVE CV LAB;  Service: Cardiovascular;  Laterality: N/A;   CORONARY ULTRASOUND/IVUS N/A 12/31/2022   Procedure: Coronary Ultrasound/IVUS;  Surgeon: Swaziland, Peter M, MD;  Location: [facility] INVASIVE CV LAB;  Service:  Cardiovascular;  Laterality: N/A;   LEFT HEART CATH AND CORONARY ANGIOGRAPHY N/A 04/16/2022   Procedure: LEFT HEART CATH AND CORONARY ANGIOGRAPHY;  Surgeon: Lennette Bihari, MD;  Location: [facility] INVASIVE CV LAB;  Service: Cardiovascular;  Laterality: N/A;   LEFT HEART CATH AND CORONARY ANGIOGRAPHY N/A 12/31/2022   Procedure: LEFT HEART CATH AND CORONARY ANGIOGRAPHY;  Surgeon: Swaziland, Peter M, MD;  Location: [facility] INVASIVE CV LAB;  Service: Cardiovascular;   Laterality: N/A;   LOOP RECORDER INSERTION N/A 04/15/2022   Procedure: LOOP RECORDER INSERTION;  Surgeon: Lanier Prude, MD;  Location: [facility] INVASIVE CV LAB;  Service: Cardiovascular;  Laterality: N/A;   MASS EXCISION Right 06/07/2019   Procedure: Dawson Bills OF KELOID TO RIGHT NECK;  Surgeon: Louisa Second, MD;  Location: [facility];  Service: Plastics;  Laterality: Right;   TOOTH EXTRACTION  2013     Medications Prior to Admission  Medication Sig Dispense Refill Last Dose   aspirin 81 MG chewable tablet Chew 1 tablet (81 mg total) by mouth daily. 90 tablet 3 06/27/2023   atorvastatin (LIPITOR) 80 MG tablet Take 80 mg by mouth at bedtime.   06/26/2023   clopidogrel (PLAVIX) 75 MG tablet Take 1 tablet (75 mg total) by mouth daily. 30 tablet 11 06/27/2023 at 1000   FARXIGA 10 MG TABS tablet Take 10 mg by mouth daily.   06/27/2023   furosemide (LASIX) 20 MG tablet Take 1 tablet (20 mg total) by mouth daily. May take extra 20 mg as needed for fluid (Patient taking differently: Take 20 mg by mouth daily.) 90 tablet 3 06/27/2023   gabapentin (NEURONTIN) 600 MG tablet Take 600 mg by mouth 2 (two) times daily.   06/27/2023   magnesium oxide (MAG-OX) 400 MG tablet Take 400 mg by mouth daily.   06/27/2023   metFORMIN (GLUCOPHAGE-XR) 500 MG 24 hr tablet Take 500 mg by mouth daily with breakfast.   06/27/2023   metoprolol succinate (TOPROL-XL) 50 MG 24 hr tablet Take 1 tablet (50 mg total) by mouth 2 (two) times daily. 180 tablet 3 06/27/2023   nitroGLYCERIN (NITROSTAT) 0.4 MG SL tablet Place 1 tablet (0.4 mg total) under the tongue every 5 (five) minutes x 3 doses as needed for chest pain. 25 tablet 2 unknown   OZEMPIC, 1 MG/DOSE, 4 MG/3ML SOPN Inject 1 mg into the skin once a week.   06/25/2023   pantoprazole (PROTONIX) 20 MG tablet Take 1 tablet (20 mg total) by mouth daily. 90 tablet 3 06/27/2023   sacubitril-valsartan (ENTRESTO) 97-103 MG Take 1 tablet by mouth 2 (two) times daily. 60 tablet 5  06/27/2023   spironolactone (ALDACTONE) 25 MG tablet Take 1 tablet (25 mg total) by mouth daily. 90 tablet 3 06/27/2023   TRESIBA FLEXTOUCH 200 UNIT/ML SOPN Take 80 Units by mouth at bedtime.    06/26/2023   ONETOUCH ULTRA test strip USE TO CHECK BLOOD SUGARS ONCE DAILY AND AS NEEDED       Inpatient Medications:   aspirin  81 mg Oral Daily   atorvastatin  80 mg Oral QHS   Chlorhexidine Gluconate Cloth  6 each Topical Daily   clopidogrel  75 mg Oral Daily   dapagliflozin propanediol  10 mg Oral Daily   furosemide  20 mg Oral Daily   gabapentin  600 mg Oral BID   insulin aspart  0-15 Units Subcutaneous TID WC   magnesium oxide  400 mg Oral Daily   metoprolol tartrate  75 mg Oral BID  mexiletine  150 mg Oral Q8H   pantoprazole  20 mg Oral Daily   potassium chloride  60 mEq Oral BID   sacubitril-valsartan  1 tablet Oral BID   spironolactone  25 mg Oral Daily    Allergies: No Known Allergies  Family History  Problem Relation Age of Onset   Colon cancer Mother 19   Heart attack Father    Cancer Sister        "female cancer"   Heart attack Brother    Stomach cancer Neg Hx    Pancreatic cancer Neg Hx    Esophageal cancer Neg Hx      Physical Exam: Vitals:   06/28/23 0730 06/28/23 0800 06/28/23 0807 06/28/23 0830  BP:  (!) 126/93    Pulse: 75 72  73  Resp: 14 17  12   Temp:   97.9 F (36.6 C)   TempSrc:      SpO2: 97% 96%  93%  Weight:      Height:        GEN- NAD, A&O x 3, normal affect HEENT: Normocephalic, atraumatic Lungs- CTAB, Normal effort.  Heart- Regular rate and rhythm, No M/G/R.  GI- Soft, NT, ND.  Extremities- No clubbing, cyanosis, or edema   Radiology/Studies: DG Chest 2 View  Result Date: 06/27/2023 CLINICAL DATA:  Syncope EXAM: CHEST - 2 VIEW COMPARISON:  Chest x-ray 01/06/2023 FINDINGS: Loop recorder device overlies the left anterior chest. The heart is enlarged. The lungs and costophrenic angles are clear. There is no pneumothorax or acute fracture.  IMPRESSION: Cardiomegaly. No acute cardiopulmonary process. Electronically Signed   By: Darliss Cheney M.D.   On: 06/27/2023 17:24   CUP PACEART REMOTE DEVICE CHECK  Result Date: 06/03/2023 ILR summary report received. Battery status OK. Normal device function. No new tachy, brady, or pause episodes. No new AF episodes. 1 Symptom event on 05/08/23 at 8:31 am, SR with occasional PAC and PVC ectopy, and noise oversensing, no annotations available. Monthly summary reports and ROV/PRN. [facility], [facility]   EKG:  Sinus rhythm, QT interval improved (personally reviewed)  TELEMETRY: Some PVCs, no sustained VAs (personally reviewed)  Loop Recorder:    Cardiac MRI 05/19/23:  IMPRESSION: 1. Moderately dilated LV with wall motion abnormalities as noted above. LV EF 21%.   2.  Normal RV size with EF 45%.   3. Patient has known CAD, but LGE pattern does not completely fit an ischemic cardiomyopathy. There is mid-wall LGE in the basal anterior, septal, and inferior segments. This is not a typical coronary pattern and could be seen with myocarditis or infiltrative disease (amyloidosis, sarcoidosis). The subendocardial LGE in the inferolateral wall and mid inferior wall could be seen with prior MI, but reviewing catheterization report, the coronaries supplying these territories do not have severe disease. Cardiac amyloidosis could be a consideration given age and ethnicity, but there is no significant LVH.   4. Mildly elevated extracellular volume percentage suggests increased myocardial fibrosis content.  Assessment/Plan:  Paula Wong is a 77 y.o. female with a known chronic systolic heart failure (EF 25 to 30%), multivessel coronary artery disease (40% ostial left main stenosis, 40% ostial LAD stenosis, 80% mid LAD stenosis, stenoses involving the origins of 2 large diagonal arteries, 70% RV branch stenosis), on a background of hypercholesterolemia, hypertension, history of remote stroke (August  2023).   Patient had an episode of sustained ventricular tachycardia associated with syncope.  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  Low suspicion for an ischemic  etiology, but rather this is likely scar mediated reentry.  Her EF remains low despite guideline directed medical therapy.  Appears relatively well compensated on exam.  1.  Ventricular tachycardia -Start oral amiodarone.  Her QT interval has normalized with potassium repletion. -Patient meets criteria for secondary prevention ICD.  ICD was offered and the patient would like to proceed but will discuss further with family.  Earliest device implant date would be Monday, 11/11.  There was some question of an infiltrative process on her cardiac MRI and she had 7 minutes of A-fib immediately following her VT episode, so would implant a dual-chamber ICD. -Stop heparin prior to device implant and continue aspirin and Plavix for now.    For questions or updates, please contact [facility] Please consult [email or web address] for contact info under Cardiology/STEMI.  Signed, Nobie Putnam, MD  06/28/2023 9:22 AM

## 2023-06-28 NOTE — Progress Notes (Signed)
DAILY PROGRESS NOTE   Patient Name: Paula Wong Date of Encounter: 06/28/2023 Cardiologist: Kristeen Miss, MD  Chief Complaint   No complaints  Patient Profile   Paula Wong is a 77 y.o. female with sustained VT   Subjective   Noted to have PVC's overnight, but no further VT. Plan for EP evaluation today. Creatinine improved today to 0.84. Potassium 3.8, magnesium was 2.0. Initial potassium was 2.9.   Objective   Vitals:   06/28/23 0300 06/28/23 0400 06/28/23 0500 06/28/23 0600  BP: (!) 141/86 (!) 140/77 (!) 142/88 (!) 140/85  Pulse: 74 62 71 69  Resp: 12 15 13 13   Temp:      TempSrc:  Oral    SpO2: 93% 90% 91% 94%  Weight:   83.8 kg   Height:        Intake/Output Summary (Last 24 hours) at 06/28/2023 1610 Last data filed at 06/28/2023 0600 Gross per 24 hour  Intake 681.53 ml  Output 0 ml  Net 681.53 ml   Filed Weights   06/27/23 1442 06/27/23 2100 06/28/23 0500  Weight: 82.1 kg 83.8 kg 83.8 kg    Physical Exam   General appearance: alert and no distress Neck: no carotid bruit, no JVD, and thyroid not enlarged, symmetric, no tenderness/mass/nodules Lungs: clear to auscultation bilaterally Heart: regular rate and rhythm, S1, S2 normal, no murmur, click, rub or gallop Abdomen: soft, non-tender; bowel sounds normal; no masses,  no organomegaly Extremities: extremities normal, atraumatic, no cyanosis or edema Pulses: 2+ and symmetric Skin: Skin color, texture, turgor normal. No rashes or lesions Neurologic: Grossly normal Psych: Pleasant    Inpatient Medications    Scheduled Meds:  aspirin  81 mg Oral Daily   atorvastatin  80 mg Oral QHS   Chlorhexidine Gluconate Cloth  6 each Topical Daily   clopidogrel  75 mg Oral Daily   dapagliflozin propanediol  10 mg Oral Daily   furosemide  20 mg Oral Daily   gabapentin  600 mg Oral BID   insulin aspart  0-15 Units Subcutaneous TID WC   magnesium oxide  400 mg Oral Daily   metoprolol tartrate  75 mg  Oral BID   mexiletine  150 mg Oral Q8H   pantoprazole  20 mg Oral Daily   potassium chloride  60 mEq Oral BID   sacubitril-valsartan  1 tablet Oral BID   spironolactone  25 mg Oral Daily    Continuous Infusions:  heparin 1,150 Units/hr (06/28/23 0600)    PRN Meds: acetaminophen, ondansetron (ZOFRAN) IV, mouth rinse   Labs   Results for orders placed or performed during the hospital encounter of 06/27/23 (from the past 48 hour(s))  POC CBG, ED     Status: Abnormal   Collection Time: 06/27/23  2:46 PM  Result Value Ref Range   Glucose-Capillary 116 (H) 70 - 99 mg/dL    Comment: Glucose reference range applies only to samples taken after fasting for at least 8 hours.  CBC     Status: Abnormal   Collection Time: 06/27/23  2:49 PM  Result Value Ref Range   WBC 6.0 4.0 - 10.5 K/uL   RBC 5.19 (H) 3.87 - 5.11 MIL/uL   Hemoglobin 13.9 12.0 - 15.0 g/dL   HCT 96.0 45.4 - 09.8 %   MCV 84.0 80.0 - 100.0 fL   MCH 26.8 26.0 - 34.0 pg   MCHC 31.9 30.0 - 36.0 g/dL   RDW 11.9 14.7 - 82.9 %  Platelets 261 150 - 400 K/uL   nRBC 0.0 0.0 - 0.2 %    Comment: Performed at [facility], [address], [place], [place] [address]  Troponin I (High Sensitivity)     Status: None   Collection Time: 06/27/23  2:49 PM  Result Value Ref Range   Troponin I (High Sensitivity) 3 <18 ng/L    Comment: (NOTE) Elevated high sensitivity troponin I (hsTnI) values and significant  changes across serial measurements may suggest ACS but many other  chronic and acute conditions are known to elevate hsTnI results.  Refer to the "Links" section for chest pain algorithms and additional  guidance. Performed at [facility], [address], [place], [place] [address]   Comprehensive metabolic panel     Status: Abnormal   Collection Time: 06/27/23  2:49 PM  Result Value Ref Range   Sodium 143 135 - 145 mmol/L   Potassium 2.9 (L) 3.5 - 5.1 mmol/L   Chloride 107 98 - 111 mmol/L   CO2 25 22 - 32  mmol/L   Glucose, Bld 99 70 - 99 mg/dL    Comment: Glucose reference range applies only to samples taken after fasting for at least 8 hours.   BUN 7 (L) 8 - 23 mg/dL   Creatinine, Ser 3.32 (H) 0.44 - 1.00 mg/dL   Calcium 9.3 8.9 - 95.1 mg/dL   Total Protein 7.2 6.5 - 8.1 g/dL   Albumin 3.6 3.5 - 5.0 g/dL   AST 13 (L) 15 - 41 U/L   ALT 10 0 - 44 U/L   Alkaline Phosphatase 64 38 - 126 U/L   Total Bilirubin 1.7 (H) <1.2 mg/dL   GFR, Estimated 57 (L) >60 mL/min    Comment: (NOTE) Calculated using the CKD-EPI Creatinine Equation (2021)    Anion gap 11 5 - 15    Comment: Performed at [facility], [address] [place], [place] [address]  Troponin I (High Sensitivity)     Status: None   Collection Time: 06/27/23  4:31 PM  Result Value Ref Range   Troponin I (High Sensitivity) 3 <18 ng/L    Comment: (NOTE) Elevated high sensitivity troponin I (hsTnI) values and significant  changes across serial measurements may suggest ACS but many other  chronic and acute conditions are known to elevate hsTnI results.  Refer to the "Links" section for chest pain algorithms and additional  guidance. Performed at [facility], [address], [place], [place] [address]   CBC     Status: None   Collection Time: 06/27/23  7:38 PM  Result Value Ref Range   WBC 5.7 4.0 - 10.5 K/uL   RBC 5.01 3.87 - 5.11 MIL/uL   Hemoglobin 13.4 12.0 - 15.0 g/dL   HCT 16.0 10.9 - 32.3 %   MCV 83.8 80.0 - 100.0 fL   MCH 26.7 26.0 - 34.0 pg   MCHC 31.9 30.0 - 36.0 g/dL   RDW 55.7 32.2 - 02.5 %   Platelets 269 150 - 400 K/uL   nRBC 0.0 0.0 - 0.2 %    Comment: Performed at [facility], [address], [place], [place] [address]  Creatinine, serum     Status: Abnormal   Collection Time: 06/27/23  7:38 PM  Result Value Ref Range   Creatinine, Ser 0.98 0.44 - 1.00 mg/dL   GFR, Estimated 59 (L) >60 mL/min    Comment: (NOTE) Calculated using the CKD-EPI  Creatinine Equation (2021) Performed at [facility], [address], [place], [place] [address]   Magnesium     Status: None   Collection Time: 06/27/23  7:38 PM  Result Value Ref Range   Magnesium 2.0 1.7 - 2.4 mg/dL    Comment: HEMOLYSIS AT THIS LEVEL MAY AFFECT RESULT Performed at [facility], [address], [place], [place] [address]   Glucose, capillary     Status: None   Collection Time: 06/27/23  9:15 PM  Result Value Ref Range   Glucose-Capillary 98 70 - 99 mg/dL    Comment: Glucose reference range applies only to samples taken after fasting for at least 8 hours.  MRSA Next Gen by PCR, Nasal     Status: None   Collection Time: 06/27/23 10:13 PM   Specimen: Nasal Mucosa; Nasal Swab  Result Value Ref Range   MRSA by PCR Next Gen NOT DETECTED NOT DETECTED    Comment: (NOTE) The GeneXpert MRSA Assay (FDA approved for NASAL specimens only), is one component of a comprehensive MRSA colonization surveillance program. It is not intended to diagnose MRSA infection nor to guide or monitor treatment for MRSA infections. Test performance is not FDA approved in patients less than 63 years old. Performed at [facility], [address], [place], [place] [address]   Basic metabolic panel     Status: Abnormal   Collection Time: 06/28/23  5:40 AM  Result Value Ref Range   Sodium 141 135 - 145 mmol/L   Potassium 3.8 3.5 - 5.1 mmol/L   Chloride 105 98 - 111 mmol/L   CO2 25 22 - 32 mmol/L   Glucose, Bld 101 (H) 70 - 99 mg/dL    Comment: Glucose reference range applies only to samples taken after fasting for at least 8 hours.   BUN 6 (L) 8 - 23 mg/dL   Creatinine, Ser 5.28 0.44 - 1.00 mg/dL   Calcium 8.8 (L) 8.9 - 10.3 mg/dL   GFR, Estimated >41 >32 mL/min    Comment: (NOTE) Calculated using the CKD-EPI Creatinine Equation (2021)    Anion gap 11 5 - 15    Comment: Performed at [facility], [address], [place], [place] [address]  CBC     Status: None   Collection Time: 06/28/23  5:40 AM  Result  Value Ref Range   WBC 4.1 4.0 - 10.5 K/uL   RBC 5.11 3.87 - 5.11 MIL/uL   Hemoglobin 13.3 12.0 - 15.0 g/dL   HCT 27.2 53.6 - 64.4 %   MCV 83.4 80.0 - 100.0 fL   MCH 26.0 26.0 - 34.0 pg   MCHC 31.2 30.0 - 36.0 g/dL   RDW 03.4 74.2 - 59.5 %   Platelets 245 150 - 400 K/uL   nRBC 0.0 0.0 - 0.2 %    Comment: Performed at [facility], [address], [place], [place] [address]  Heparin level (unfractionated)     Status: None   Collection Time: 06/28/23  5:40 AM  Result Value Ref Range   Heparin Unfractionated 0.68 0.30 - 0.70 IU/mL    Comment: (NOTE) The clinical reportable range upper limit is being lowered to >1.10 to align with the FDA approved guidance for the current laboratory assay.  If heparin results are below expected values, and patient dosage has  been confirmed, suggest follow up testing of antithrombin III levels. Performed at [facility], [address], [place], [place] [address]   Glucose, capillary     Status: None   Collection Time: 06/28/23  6:22 AM  Result Value Ref Range   Glucose-Capillary 92 70 - 99 mg/dL    Comment: Glucose reference range applies only to samples taken after fasting for at least 8 hours.    ECG   NSR at 71, normal QTc - Personally Reviewed  Telemetry   Sinus rhythm with occasional PVC's, no VT - Personally Reviewed  Radiology    DG Chest 2 View  Result Date: 06/27/2023 CLINICAL DATA:  Syncope EXAM: CHEST - 2 VIEW COMPARISON:  Chest x-ray 01/06/2023 FINDINGS: Loop recorder device overlies the left anterior chest. The heart is enlarged. The lungs and costophrenic angles are clear. There is no pneumothorax or acute fracture. IMPRESSION: Cardiomegaly. No acute cardiopulmonary process. Electronically Signed   By: Darliss Cheney M.D.   On: 06/27/2023 17:24    Cardiac Studies   N/A  Assessment   Principal Problem:   Ventricular tachycardia (HCC)   Plan   No further VT overnight. She feels well and her HF is  clinically compensated. K 3.8 today -she is on 60 MEQ BID potassium - started on aldactone. She is on mexiletine as well now.  Plan limited repeat echo today. Short 7  minutes of afib- but no more, will ask EP but would favor stopping anticoagulation since she is on DAPT. Appreciate EP recommendations about AICD - she has a loop recorder in place.   Time Spent Directly with Patient:  I have spent a total of 35 minutes with the patient reviewing hospital notes, telemetry, EKGs, labs and examining the patient as well as establishing an assessment and plan that was discussed personally with the patient.  > 50% of time was spent in direct patient care.  Length of Stay:  LOS: 1 day   Chrystie Nose, MD, [facility], FACP    [facility]  Medical Director of the [facility]  Cardiovascular Risk Reduction Clinic Diplomate of the American Board of Clinical Lipidology Attending Cardiologist  Direct Dial: [phone]  Fax: [phone]  Website:  [email or web address] Nicely Semir Brill 06/28/2023, 8:07 AM

## 2023-06-28 NOTE — Progress Notes (Signed)
PHARMACY - ANTICOAGULATION CONSULT NOTE  Pharmacy Consult for heparin infusion Indication: atrial fibrillation  No Known Allergies  Patient Measurements: Height: 5\' 6"  (167.6 cm) Weight: 83.8 kg (184 lb 11.9 oz) IBW/kg (Calculated) : 59.3 Heparin Dosing Weight: 76.5 kg  Vital Signs: Temp: 97.9 F (36.6 C) (11/09 0807) Temp Source: Oral (11/09 0400) BP: 132/80 (11/09 1100) Pulse Rate: 73 (11/09 1200)  Labs: Recent Labs    06/27/23 1449 06/27/23 1631 06/27/23 1938 06/28/23 0540 06/28/23 1206  HGB 13.9  --  13.4 13.3  --   HCT 43.6  --  42.0 42.6  --   PLT 261  --  269 245  --   HEPARINUNFRC  --   --   --  0.68 0.68  CREATININE 1.01*  --  0.98 0.84  --   TROPONINIHS 3 3  --   --   --     Estimated Creatinine Clearance: 60.2 mL/min (by C-G formula based on SCr of 0.84 mg/dL).   Medical History: Past Medical History:  Diagnosis Date   Allergy    CAD (coronary artery disease)    a. 03/2022 Cath: LM 40ost, 67m, LAD 40ost/p, 68m, 32m, LCX 93m, RCA nl, RV branch 70-->Med rx.   Chronic HFrEF (heart failure with reduced ejection fraction) (HCC)    a. 11/2021 Echo: EF 20-25%; b. 03/2022 Echo: EF 20-25%; c. 11/2022 Echo: EF 25-30%, glob HK.   Diabetes (HCC)    Diverticulosis    GERD (gastroesophageal reflux disease)    Hiatal hernia    Hyperlipidemia    Hypertension    Implantable loop recorder present    a. 03/2022 s/p Abbott IQ EL+ (serial [id number]) ILR in setting of cryptogenic stroke.   Ischemic cardiomyopathy    a. 11/2021 Echo: EF 20-25%; b. 03/2022 Echo: EF 20-25%; c. 11/2022 Echo: EF 25-30%, glob HK, nl RV fxn, mild-mod MR, mild AI, AoV sclerosis.   Neuropathy    feet   Schatzki's ring    Stroke [facility])    a. 03/2022 MRI Brain: subacute cortical infarct involving inf and med L occipial pole w/ petechial hemorrhage; b. 03/2022 s/p Abbott Assert IQ EL ILR.   Ventricular tachycardia (HCC)    a. 12/2022 noted on ILR.   Assessment: 77yoF admitted for SVT, found with  7-minute run of afib. CHADSVASc score of 59 (age, sex, CHF, CAD, HTN, DM, CVA history), pharmacy has been consulted to initiate heparin infusion for anticoagulation in this setting. Previously on clopidogrel, aspirin due to history of PCI. Continuing triple therapy for now.  Heparin level is therapeutic at 0.68 on 1150 units/hour. CBC stable, no s/sx of bleeding.  Goal of Therapy:  Heparin level 0.3-0.7 units/ml Monitor platelets by anticoagulation protocol: Yes   Plan:  Continue heparin infusion at 1150 units/hr Check anti-Xa level in 8 hours and daily while on heparin Continue to monitor H&H and platelets Follow-up for continuation of UFH, considering stopping per cards/EP recs.  Wilmer Floor, PharmD PGY2 Cardiology Pharmacy Resident 06/28/2023,2:17 PM

## 2023-06-29 DIAGNOSIS — I472 Ventricular tachycardia, unspecified: Secondary | ICD-10-CM | POA: Diagnosis not present

## 2023-06-29 DIAGNOSIS — I5022 Chronic systolic (congestive) heart failure: Secondary | ICD-10-CM | POA: Diagnosis not present

## 2023-06-29 LAB — CBC
HCT: 44.1 % (ref 36.0–46.0)
Hemoglobin: 14.2 g/dL (ref 12.0–15.0)
MCH: 27 pg (ref 26.0–34.0)
MCHC: 32.2 g/dL (ref 30.0–36.0)
MCV: 84 fL (ref 80.0–100.0)
Platelets: 196 10*3/uL (ref 150–400)
RBC: 5.25 MIL/uL — ABNORMAL HIGH (ref 3.87–5.11)
RDW: 14 % (ref 11.5–15.5)
WBC: 4.2 10*3/uL (ref 4.0–10.5)
nRBC: 0 % (ref 0.0–0.2)

## 2023-06-29 LAB — BASIC METABOLIC PANEL
Anion gap: 10 (ref 5–15)
BUN: 11 mg/dL (ref 8–23)
CO2: 23 mmol/L (ref 22–32)
Calcium: 9.5 mg/dL (ref 8.9–10.3)
Chloride: 105 mmol/L (ref 98–111)
Creatinine, Ser: 0.97 mg/dL (ref 0.44–1.00)
GFR, Estimated: 60 mL/min — ABNORMAL LOW (ref >60)
Glucose, Bld: 141 mg/dL — ABNORMAL HIGH (ref 70–99)
Potassium: 4.8 mmol/L (ref 3.5–5.1)
Sodium: 138 mmol/L (ref 135–145)

## 2023-06-29 LAB — HEPARIN LEVEL (UNFRACTIONATED): Heparin Unfractionated: <0.1 [IU]/mL — ABNORMAL LOW (ref 0.30–0.70)

## 2023-06-29 LAB — GLUCOSE, CAPILLARY
Glucose-Capillary: 128 mg/dL — ABNORMAL HIGH (ref 70–99)
Glucose-Capillary: 122 mg/dL — ABNORMAL HIGH (ref 70–99)
Glucose-Capillary: 136 mg/dL — ABNORMAL HIGH (ref 70–99)
Glucose-Capillary: 117 mg/dL — ABNORMAL HIGH (ref 70–99)

## 2023-06-29 LAB — MAGNESIUM: Magnesium: 1.9 mg/dL (ref 1.7–2.4)

## 2023-06-29 MED ORDER — POTASSIUM CHLORIDE 20 MEQ PO PACK
40.0000 meq | PACK | Freq: Every day | ORAL | Status: DC
  Administered 2023-07-01: 40 meq via ORAL
  Filled 2023-06-29: qty 2

## 2023-06-29 MED ORDER — SODIUM CHLORIDE 0.9 % IV SOLN
INTRAVENOUS | Status: AC | PRN
  Administered 2023-06-29: 10 mL/h via INTRAVENOUS

## 2023-06-29 MED ORDER — METOPROLOL TARTRATE 50 MG PO TABS
50.0000 mg | ORAL_TABLET | Freq: Two times a day (BID) | ORAL | Status: DC
  Administered 2023-06-29: 50 mg via ORAL
  Filled 2023-06-29: qty 1

## 2023-06-29 MED ORDER — MAGNESIUM SULFATE 2 GM/50ML IV SOLN
2.0000 g | Freq: Once | INTRAVENOUS | Status: AC
  Administered 2023-06-29: 2 g via INTRAVENOUS
  Filled 2023-06-29: qty 50

## 2023-06-29 MED ORDER — METOPROLOL TARTRATE 50 MG PO TABS: 50.0000 mg | ORAL_TABLET | Freq: Two times a day (BID) | ORAL | Status: DC

## 2023-06-29 NOTE — Progress Notes (Addendum)
DAILY PROGRESS NOTE   Patient Name: Paula Wong Date of Encounter: 06/29/2023 Cardiologist: Kristeen Miss, MD  Chief Complaint   No complaints  Patient Profile   Paula Wong is a 77 y.o. female with sustained VT   Subjective   Again no VT overnight- some PVC's and artifact. Feels well. Net negative yesterday. Limited echo yesterday shows LVEF remains <20%.  Objective   Vitals:   06/29/23 0400 06/29/23 0500 06/29/23 0650 06/29/23 0742  BP: 117/81 110/71    Pulse: 71 67    Resp: 14 16    Temp:   98.2 F (36.8 C) 98.7 F (37.1 C)  TempSrc:   Oral Oral  SpO2: 92% 94%    Weight:      Height:        Intake/Output Summary (Last 24 hours) at 06/29/2023 1610 Last data filed at 06/29/2023 0350 Gross per 24 hour  Intake 234.21 ml  Output 1220 ml  Net -985.79 ml   Filed Weights   06/27/23 1442 06/27/23 2100 06/28/23 0500  Weight: 82.1 kg 83.8 kg 83.8 kg    Physical Exam   General appearance: alert and no distress Neck: no carotid bruit, no JVD, and thyroid not enlarged, symmetric, no tenderness/mass/nodules Lungs: clear to auscultation bilaterally Heart: regular rate and rhythm, S1, S2 normal, no murmur, click, rub or gallop Abdomen: soft, non-tender; bowel sounds normal; no masses,  no organomegaly Extremities: extremities normal, atraumatic, no cyanosis or edema Pulses: 2+ and symmetric Skin: Skin color, texture, turgor normal. No rashes or lesions Neurologic: Grossly normal Psych: Pleasant    Inpatient Medications    Scheduled Meds:  amiodarone  400 mg Oral BID   aspirin  81 mg Oral Daily   atorvastatin  80 mg Oral QHS   Chlorhexidine Gluconate Cloth  6 each Topical Daily   clopidogrel  75 mg Oral Daily   dapagliflozin propanediol  10 mg Oral Daily   furosemide  20 mg Oral Daily   gabapentin  600 mg Oral BID   heparin injection (subcutaneous)  5,000 Units Subcutaneous Q8H   insulin aspart  0-15 Units Subcutaneous TID WC   magnesium oxide   400 mg Oral Daily   metoprolol tartrate  75 mg Oral BID   mexiletine  150 mg Oral Q8H   pantoprazole  20 mg Oral Daily   potassium chloride  40 mEq Oral BID   sacubitril-valsartan  1 tablet Oral BID   spironolactone  25 mg Oral Daily    Continuous Infusions:    PRN Meds: acetaminophen, ondansetron (ZOFRAN) IV, mouth rinse   Labs   Results for orders placed or performed during the hospital encounter of 06/27/23 (from the past 48 hour(s))  POC CBG, ED     Status: Abnormal   Collection Time: 06/27/23  2:46 PM  Result Value Ref Range   Glucose-Capillary 116 (H) 70 - 99 mg/dL    Comment: Glucose reference range applies only to samples taken after fasting for at least 8 hours.  CBC     Status: Abnormal   Collection Time: 06/27/23  2:49 PM  Result Value Ref Range   WBC 6.0 4.0 - 10.5 K/uL   RBC 5.19 (H) 3.87 - 5.11 MIL/uL   Hemoglobin 13.9 12.0 - 15.0 g/dL   HCT 96.0 45.4 - 09.8 %   MCV 84.0 80.0 - 100.0 fL   MCH 26.8 26.0 - 34.0 pg   MCHC 31.9 30.0 - 36.0 g/dL   RDW 11.9 14.7 -  15.5 %   Platelets 261 150 - 400 K/uL   nRBC 0.0 0.0 - 0.2 %    Comment: Performed at [facility], [address], [place], [place] [address]  Troponin I (High Sensitivity)     Status: None   Collection Time: 06/27/23  2:49 PM  Result Value Ref Range   Troponin I (High Sensitivity) 3 <18 ng/L    Comment: (NOTE) Elevated high sensitivity troponin I (hsTnI) values and significant  changes across serial measurements may suggest ACS but many other  chronic and acute conditions are known to elevate hsTnI results.  Refer to the "Links" section for chest pain algorithms and additional  guidance. Performed at [facility], [address] [place], [place] [address]   Comprehensive metabolic panel     Status: Abnormal   Collection Time: 06/27/23  2:49 PM  Result Value Ref Range   Sodium 143 135 - 145 mmol/L   Potassium 2.9 (L) 3.5 - 5.1 mmol/L   Chloride 107 98 - 111 mmol/L   CO2 25 22 -  32 mmol/L   Glucose, Bld 99 70 - 99 mg/dL    Comment: Glucose reference range applies only to samples taken after fasting for at least 8 hours.   BUN 7 (L) 8 - 23 mg/dL   Creatinine, Ser 2.95 (H) 0.44 - 1.00 mg/dL   Calcium 9.3 8.9 - 28.4 mg/dL   Total Protein 7.2 6.5 - 8.1 g/dL   Albumin 3.6 3.5 - 5.0 g/dL   AST 13 (L) 15 - 41 U/L   ALT 10 0 - 44 U/L   Alkaline Phosphatase 64 38 - 126 U/L   Total Bilirubin 1.7 (H) <1.2 mg/dL   GFR, Estimated 57 (L) >60 mL/min    Comment: (NOTE) Calculated using the CKD-EPI Creatinine Equation (2021)    Anion gap 11 5 - 15    Comment: Performed at [facility], [address], [place], [place] [address]  Troponin I (High Sensitivity)     Status: None   Collection Time: 06/27/23  4:31 PM  Result Value Ref Range   Troponin I (High Sensitivity) 3 <18 ng/L    Comment: (NOTE) Elevated high sensitivity troponin I (hsTnI) values and significant  changes across serial measurements may suggest ACS but many other  chronic and acute conditions are known to elevate hsTnI results.  Refer to the "Links" section for chest pain algorithms and additional  guidance. Performed at [facility], [address], [place], [place] [address]   CBC     Status: None   Collection Time: 06/27/23  7:38 PM  Result Value Ref Range   WBC 5.7 4.0 - 10.5 K/uL   RBC 5.01 3.87 - 5.11 MIL/uL   Hemoglobin 13.4 12.0 - 15.0 g/dL   HCT 25.3 66.4 - 40.3 %   MCV 83.8 80.0 - 100.0 fL   MCH 26.7 26.0 - 34.0 pg   MCHC 31.9 30.0 - 36.0 g/dL   RDW 47.4 25.9 - 56.3 %   Platelets 269 150 - 400 K/uL   nRBC 0.0 0.0 - 0.2 %    Comment: Performed at [facility], [address] [place], [place] [address]  Creatinine, serum     Status: Abnormal   Collection Time: 06/27/23  7:38 PM  Result Value Ref Range   Creatinine, Ser 0.98 0.44 - 1.00 mg/dL   GFR, Estimated 59 (L) >60 mL/min    Comment: (NOTE)  Calculated using the CKD-EPI Creatinine Equation (2021) Performed at  [facility], [address] [place], [place] [address]   Magnesium     Status: None   Collection Time: 06/27/23  7:38 PM  Result Value Ref Range   Magnesium 2.0 1.7 - 2.4 mg/dL    Comment: HEMOLYSIS AT THIS LEVEL MAY AFFECT RESULT Performed at [facility], [address], [place], [place] [address]   Glucose, capillary     Status: None   Collection Time: 06/27/23  9:15 PM  Result Value Ref Range   Glucose-Capillary 98 70 - 99 mg/dL    Comment: Glucose reference range applies only to samples taken after fasting for at least 8 hours.  MRSA Next Gen by PCR, Nasal     Status: None   Collection Time: 06/27/23 10:13 PM   Specimen: Nasal Mucosa; Nasal Swab  Result Value Ref Range   MRSA by PCR Next Gen NOT DETECTED NOT DETECTED    Comment: (NOTE) The GeneXpert MRSA Assay (FDA approved for NASAL specimens only), is one component of a comprehensive MRSA colonization surveillance program. It is not intended to diagnose MRSA infection nor to guide or monitor treatment for MRSA infections. Test performance is not FDA approved in patients less than 71 years old. Performed at [facility], [address] [place], [place] [address]   Basic metabolic panel     Status: Abnormal   Collection Time: 06/28/23  5:40 AM  Result Value Ref Range   Sodium 141 135 - 145 mmol/L   Potassium 3.8 3.5 - 5.1 mmol/L   Chloride 105 98 - 111 mmol/L   CO2 25 22 - 32 mmol/L   Glucose, Bld 101 (H) 70 - 99 mg/dL    Comment: Glucose reference range applies only to samples taken after fasting for at least 8 hours.   BUN 6 (L) 8 - 23 mg/dL   Creatinine, Ser 8.41 0.44 - 1.00 mg/dL   Calcium 8.8 (L) 8.9 - 10.3 mg/dL   GFR, Estimated >32 >44 mL/min    Comment: (NOTE) Calculated using the CKD-EPI Creatinine Equation (2021)    Anion gap 11 5 - 15    Comment: Performed at [facility], [address] [place], [place] [address]  CBC     Status: None   Collection Time: 06/28/23  5:40 AM   Result Value Ref Range   WBC 4.1 4.0 - 10.5 K/uL   RBC 5.11 3.87 - 5.11 MIL/uL   Hemoglobin 13.3 12.0 - 15.0 g/dL   HCT 25.3 66.4 - 40.3 %   MCV 83.4 80.0 - 100.0 fL   MCH 26.0 26.0 - 34.0 pg   MCHC 31.2 30.0 - 36.0 g/dL   RDW 47.4 25.9 - 56.3 %   Platelets 245 150 - 400 K/uL   nRBC 0.0 0.0 - 0.2 %    Comment: Performed at [facility], [address] [place], [place] [address]  Heparin level (unfractionated)     Status: None   Collection Time: 06/28/23  5:40 AM  Result Value Ref Range   Heparin Unfractionated 0.68 0.30 - 0.70 IU/mL    Comment: (NOTE) The clinical reportable range upper limit is being lowered to >1.10 to align with the FDA approved guidance for the current laboratory assay.  If heparin results are below expected values, and patient dosage has  been confirmed, suggest follow up testing of antithrombin III levels. Performed at [facility]  Lab, [address], [place], [place] [address]   Magnesium     Status: None   Collection Time: 06/28/23  5:40 AM  Result Value Ref Range   Magnesium 2.0 1.7 - 2.4 mg/dL    Comment: Performed at [facility], [address] [place], [place] [address]  Glucose, capillary     Status: None   Collection Time: 06/28/23  6:22 AM  Result Value Ref Range   Glucose-Capillary 92 70 - 99 mg/dL    Comment: Glucose reference range applies only to samples taken after fasting for at least 8 hours.  Heparin level (unfractionated)     Status: None   Collection Time: 06/28/23 12:06 PM  Result Value Ref Range   Heparin Unfractionated 0.68 0.30 - 0.70 IU/mL    Comment: (NOTE) The clinical reportable range upper limit is being lowered to >1.10 to align with the FDA approved guidance for the current laboratory assay.  If heparin results are below expected values, and patient dosage has  been confirmed, suggest follow up testing of antithrombin III levels. Performed at [facility], [address] [place],  [place] [address]   Glucose, capillary     Status: Abnormal   Collection Time: 06/28/23  1:07 PM  Result Value Ref Range   Glucose-Capillary 157 (H) 70 - 99 mg/dL    Comment: Glucose reference range applies only to samples taken after fasting for at least 8 hours.  Glucose, capillary     Status: Abnormal   Collection Time: 06/28/23  3:56 PM  Result Value Ref Range   Glucose-Capillary 183 (H) 70 - 99 mg/dL    Comment: Glucose reference range applies only to samples taken after fasting for at least 8 hours.  Glucose, capillary     Status: None   Collection Time: 06/28/23  9:42 PM  Result Value Ref Range   Glucose-Capillary 95 70 - 99 mg/dL    Comment: Glucose reference range applies only to samples taken after fasting for at least 8 hours.  CBC     Status: Abnormal   Collection Time: 06/29/23  5:47 AM  Result Value Ref Range   WBC 4.2 4.0 - 10.5 K/uL   RBC 5.25 (H) 3.87 - 5.11 MIL/uL   Hemoglobin 14.2 12.0 - 15.0 g/dL   HCT 74.2 59.5 - 63.8 %   MCV 84.0 80.0 - 100.0 fL   MCH 27.0 26.0 - 34.0 pg   MCHC 32.2 30.0 - 36.0 g/dL   RDW 75.6 43.3 - 29.5 %   Platelets 196 150 - 400 K/uL    Comment: REPEATED TO VERIFY   nRBC 0.0 0.0 - 0.2 %    Comment: Performed at [facility], [address], [place], [place] [address]  Glucose, capillary     Status: Abnormal   Collection Time: 06/29/23  6:52 AM  Result Value Ref Range   Glucose-Capillary 128 (H) 70 - 99 mg/dL    Comment: Glucose reference range applies only to samples taken after fasting for at least 8 hours.    ECG   N/A  Telemetry   Sinus rhythm with occasional PVC's, no VT - Personally Reviewed  Radiology    ECHOCARDIOGRAM LIMITED  Result Date: 06/28/2023    ECHOCARDIOGRAM LIMITED REPORT   Patient Name:   Paula Wong Date of Exam: 06/28/2023 Medical Rec #:  [id number]        Height:       66.0 in Accession #:  [id number]       Weight:       184.7 lb Date of Birth:  Nov 18, 1945         BSA:          1.933 m Patient Age:     78 years         BP:           132/80 mmHg Patient Gender: F                HR:           76 bpm. Exam Location:  Inpatient Procedure: Limited Echo and Intracardiac Opacification Agent Indications:    Ventricular Tachycardia I47.2  History:        Patient has prior history of Echocardiogram examinations, most                 recent 12/06/2022. Cardiomyopathy, CAD, Stroke; Risk                 Factors:Hypertension and Diabetes.  Sonographer:    Darlys Gales Referring Phys: [id number] MAHESH A CHANDRASEKHAR IMPRESSIONS  1. Limited Echocardiogram.  2. Left ventricular ejection fraction, by estimation, is <20%. The left ventricle has severely decreased function. The left ventricle demonstrates global hypokinesis. The left ventricular internal cavity size was moderately to severely dilated. There is  moderate left ventricular hypertrophy.  3. Right ventricular systolic function reduced. The right ventricular size is enlarged.  4. Left atrial size was grossly normal.  5. Right atrial size was grossly normal.  6. A small pericardial effusion is present. The pericardial effusion is posterior to the left ventricle.  7. The aortic valve is tricuspid. Aortic valve sclerosis is present, with no evidence of aortic valve stenosis.  8. The inferior vena cava is normal in size with greater than 50% respiratory variability, suggesting right atrial pressure of 3 mmHg. Comparison(s): A prior study was performed on 04/13/2022. LVEF remains severely reduced. Conclusion(s)/Recommendation(s): No left ventricular mural or apical thrombus/thrombi. FINDINGS  Left Ventricle: Left ventricular ejection fraction, by estimation, is <20%. The left ventricle has severely decreased function. The left ventricle demonstrates global hypokinesis. Definity contrast agent was given IV to delineate the left ventricular endocardial borders. The left ventricular internal cavity size was moderately to severely dilated. There is moderate left ventricular  hypertrophy. Right Ventricle: The right ventricular size is enlarged. No increase in right ventricular wall thickness. Right ventricular systolic function reduced. Left Atrium: Left atrial size was grossly normal. Right Atrium: Right atrial size was grossly normal. Pericardium: A small pericardial effusion is present. The pericardial effusion is posterior to the left ventricle. Aortic Valve: The aortic valve is tricuspid. Aortic valve sclerosis is present, with no evidence of aortic valve stenosis. Aorta: The aortic root and ascending aorta are structurally normal, with no evidence of dilitation. Venous: The inferior vena cava is normal in size with greater than 50% respiratory variability, suggesting right atrial pressure of 3 mmHg. LEFT VENTRICLE PLAX 2D LVIDd:         6.15 cm LVIDs:         6.00 cm LV PW:         1.20 cm LV IVS:        1.15 cm LVOT diam:     2.00 cm LVOT Area:     3.14 cm  LEFT ATRIUM           Index LA Vol (A2C): 35.0 ml 18.10 ml/m   AORTA Ao Root diam:  3.20 cm Ao Asc diam:  3.70 cm  SHUNTS Systemic Diam: 2.00 cm Sunit Tolia Electronically signed by Tessa Lerner Signature Date/Time: 06/28/2023/4:40:33 PM    Final    DG Chest 2 View  Result Date: 06/27/2023 CLINICAL DATA:  Syncope EXAM: CHEST - 2 VIEW COMPARISON:  Chest x-ray 01/06/2023 FINDINGS: Loop recorder device overlies the left anterior chest. The heart is enlarged. The lungs and costophrenic angles are clear. There is no pneumothorax or acute fracture. IMPRESSION: Cardiomegaly. No acute cardiopulmonary process. Electronically Signed   By: Darliss Cheney M.D.   On: 06/27/2023 17:24    Cardiac Studies   N/A  Assessment   Principal Problem:   Ventricular tachycardia (HCC) Chronic systolic heart failure  Plan   Appreciate Dr. Lavone Neri recommendations for AICD which she said is tentatively scheduled for tomorrow. He has started oral amiodarone in addition to the mexilitine she is currently on.  He would plan on a dual-chamber  ICD if she is agreeable. Have stopped IV heparin - continue DAPT.  BMET pending today.  Time Spent Directly with Patient:  I have spent a total of 35 minutes with the patient reviewing hospital notes, telemetry, EKGs, labs and examining the patient as well as establishing an assessment and plan that was discussed personally with the patient.  > 50% of time was spent in direct patient care.  Length of Stay:  LOS: 2 days   Chrystie Nose, MD, [facility], FACP  [place]  [facility]  Medical Director of the [facility]  Cardiovascular Risk Reduction Clinic Diplomate of the American Board of Clinical Lipidology Attending Cardiologist  Direct Dial: [phone]  Fax: [phone]  Website:  [email or web address]  Lisette Abu Emmalynne Courtney 06/29/2023, 8:12 AM

## 2023-06-29 NOTE — Progress Notes (Incomplete)
PHARMACY - ANTICOAGULATION CONSULT NOTE  Pharmacy Consult for heparin infusion Indication: atrial fibrillation  No Known Allergies  Patient Measurements: Height: 5\' 6"  (167.6 cm) Weight: 83.8 kg (184 lb 11.9 oz) IBW/kg (Calculated) : 59.3 Heparin Dosing Weight: 76.5 kg  Vital Signs: Temp: 98.1 F (36.7 C) (11/09 2300) Temp Source: Axillary (11/09 2300) BP: 110/71 (11/10 0500) Pulse Rate: 67 (11/10 0500)  Labs: Recent Labs    06/27/23 1449 06/27/23 1631 06/27/23 1938 06/28/23 0540 06/28/23 1206 06/29/23 0547  HGB 13.9  --  13.4 13.3  --  14.2  HCT 43.6  --  42.0 42.6  --  44.1  PLT 261  --  269 245  --  196  HEPARINUNFRC  --   --   --  0.68 0.68  --   CREATININE 1.01*  --  0.98 0.84  --   --   TROPONINIHS 3 3  --   --   --   --     Estimated Creatinine Clearance: 60.2 mL/min (by C-G formula based on SCr of 0.84 mg/dL).   Medical History: Past Medical History:  Diagnosis Date   Allergy    CAD (coronary artery disease)    a. 03/2022 Cath: LM 40ost, 54m, LAD 40ost/p, 85m, 46m, LCX 39m, RCA nl, RV branch 70-->Med rx.   Chronic HFrEF (heart failure with reduced ejection fraction) (HCC)    a. 11/2021 Echo: EF 20-25%; b. 03/2022 Echo: EF 20-25%; c. 11/2022 Echo: EF 25-30%, glob HK.   Diabetes (HCC)    Diverticulosis    GERD (gastroesophageal reflux disease)    Hiatal hernia    Hyperlipidemia    Hypertension    Implantable loop recorder present    a. 03/2022 s/p Abbott IQ EL+ (serial [id number]) ILR in setting of cryptogenic stroke.   Ischemic cardiomyopathy    a. 11/2021 Echo: EF 20-25%; b. 03/2022 Echo: EF 20-25%; c. 11/2022 Echo: EF 25-30%, glob HK, nl RV fxn, mild-mod MR, mild AI, AoV sclerosis.   Neuropathy    feet   Schatzki's ring    Stroke [facility])    a. 03/2022 MRI Brain: subacute cortical infarct involving inf and med L occipial pole w/ petechial hemorrhage; b. 03/2022 s/p Abbott Assert IQ EL ILR.   Ventricular tachycardia (HCC)    a. 12/2022 noted on ILR.    Assessment: 77yoF admitted for SVT, found with 7-minute run of afib. CHADSVASc score of 26 (age, sex, CHF, CAD, HTN, DM, CVA history), pharmacy has been consulted to initiate heparin infusion for anticoagulation in this setting. Previously on clopidogrel, aspirin due to history of PCI. Continuing triple therapy for now.  Heparin level is therapeutic at 0.68 on 1150 units/hour. CBC stable, no s/sx of bleeding.  Goal of Therapy:  Heparin level 0.3-0.7 units/ml Monitor platelets by anticoagulation protocol: Yes   Plan:  Continue heparin infusion at 1150 units/hr Check anti-Xa level in 8 hours and daily while on heparin Continue to monitor H&H and platelets Follow-up for continuation of UFH, considering stopping per cards/EP recs.  Wilmer Floor, PharmD PGY2 Cardiology Pharmacy Resident 06/29/2023,6:50 AM

## 2023-06-29 NOTE — Progress Notes (Signed)
Patient Name: Paula Wong Date of Encounter: 06/29/2023 [facility] Cardiologist: Kristeen Miss, MD   Interval Summary  .    No acute overnight events.  She has an asymptomatic bradycardia.  She is otherwise doing well.  With no new or acute complaints.  Vital Signs .    Vitals:   06/29/23 0742 06/29/23 0800 06/29/23 0830 06/29/23 0900  BP:  105/65  105/64  Pulse:  (!) 52 (!) 47 (!) 48  Resp:  15 17 19   Temp: 98.7 F (37.1 C)     TempSrc: Oral     SpO2:  95% 94% 95%  Weight:      Height:        Intake/Output Summary (Last 24 hours) at 06/29/2023 0903 Last data filed at 06/29/2023 0800 Gross per 24 hour  Intake 234.21 ml  Output 1420 ml  Net -1185.79 ml      06/28/2023    5:00 AM 06/27/2023    9:00 PM 06/27/2023    2:42 PM  Last 3 Weights  Weight (lbs) 184 lb 11.9 oz 184 lb 11.9 oz 181 lb  Weight (kg) 83.8 kg 83.8 kg 82.101 kg      Telemetry/ECG    Sinus with PACs and PVCs, no sustained VAs - Personally Reviewed  Physical Exam .   GEN: No acute distress.   Neck: No JVD Cardiac: Bradycardic, regular rhythm Respiratory: Clear to auscultation bilaterally. GI: Soft, nontender, non-distended  MS: No edema  EKG:  Sinus rhythm, QT interval improved (personally reviewed)   TELEMETRY: Some PVCs, no sustained VAs (personally reviewed)   Loop Recorder:     Cardiac MRI 05/19/23:  IMPRESSION: 1. Moderately dilated LV with wall motion abnormalities as noted above. LV EF 21%.   2.  Normal RV size with EF 45%.   3. Patient has known CAD, but LGE pattern does not completely fit an ischemic cardiomyopathy. There is mid-wall LGE in the basal anterior, septal, and inferior segments. This is not a typical coronary pattern and could be seen with myocarditis or infiltrative disease (amyloidosis, sarcoidosis). The subendocardial LGE in the inferolateral wall and mid inferior wall could be seen with prior MI, but reviewing catheterization report, the  coronaries supplying these territories do not have severe disease. Cardiac amyloidosis could be a consideration given age and ethnicity, but there is no significant LVH.   4. Mildly elevated extracellular volume percentage suggests increased myocardial fibrosis content.   Assessment/Plan:  Paula Wong is a 77 y.o. female with a known chronic systolic heart failure (EF 25 to 30%), multivessel coronary artery disease (40% ostial left main stenosis, 40% ostial LAD stenosis, 80% mid LAD stenosis, stenoses involving the origins of 2 large diagonal arteries, 70% RV branch stenosis), on a background of hypercholesterolemia, hypertension, history of remote stroke (August 2023).    Patient had an episode of sustained ventricular tachycardia associated with syncope.  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  Low suspicion for an ischemic etiology, but rather this is likely scar mediated reentry.  Her EF remains low despite guideline directed medical therapy.  Appears relatively well compensated on exam.   1.  Ventricular tachycardia -Continue oral amiodarone.  Stop mexiletine. -Patient meets criteria for secondary prevention ICD. Explained risks, benefits, and alternatives to ICD implantation, including but not limited to bleeding, infection, damage to heart or lungs, heart attack, stroke, or death.  Pt verbalized understanding and agrees to proceed if indicated.  Earliest device implant date would be tomorrow,  11/11.  There was some question of an infiltrative process on her cardiac MRI, reportedly had 7 minutes of A-fib immediately following her VT episode, and has bradycardia on amiodarone and metoprolol so would implant a dual-chamber ICD.  Can remove recorder at time of device implant. -Heparin stopped today.  Okay to continue aspirin and Plavix for now.  No immediate plans for oral anticoagulation.  They will monitor for any atrial fibrillation on her ICD [name] -N.p.o. after midnight.     Signed, Nobie Putnam, MD

## 2023-06-30 ENCOUNTER — Other Ambulatory Visit: Payer: Self-pay

## 2023-06-30 ENCOUNTER — Inpatient Hospital Stay (HOSPITAL_COMMUNITY)
Admission: EM | Disposition: A | Discharge status: Home / Self Care | Payer: Self-pay | Source: Home / Self Care | Attending: Internal Medicine

## 2023-06-30 DIAGNOSIS — I472 Ventricular tachycardia, unspecified: Secondary | ICD-10-CM | POA: Diagnosis not present

## 2023-06-30 HISTORY — PX: ICD IMPLANT: EP1208

## 2023-06-30 LAB — GLUCOSE, CAPILLARY
Glucose-Capillary: 106 mg/dL — ABNORMAL HIGH (ref 70–99)
Glucose-Capillary: 83 mg/dL (ref 70–99)
Glucose-Capillary: 83 mg/dL (ref 70–99)
Glucose-Capillary: 145 mg/dL — ABNORMAL HIGH (ref 70–99)

## 2023-06-30 LAB — BASIC METABOLIC PANEL
Anion gap: 9 (ref 5–15)
BUN: 13 mg/dL (ref 8–23)
CO2: 25 mmol/L (ref 22–32)
Calcium: 9.1 mg/dL (ref 8.9–10.3)
Chloride: 106 mmol/L (ref 98–111)
Creatinine, Ser: 1.14 mg/dL — ABNORMAL HIGH (ref 0.44–1.00)
GFR, Estimated: 49 mL/min — ABNORMAL LOW (ref >60)
Glucose, Bld: 124 mg/dL — ABNORMAL HIGH (ref 70–99)
Potassium: 4.4 mmol/L (ref 3.5–5.1)
Sodium: 140 mmol/L (ref 135–145)

## 2023-06-30 LAB — CBC
HCT: 42.8 % (ref 36.0–46.0)
Hemoglobin: 13.6 g/dL (ref 12.0–15.0)
MCH: 26.5 pg (ref 26.0–34.0)
MCHC: 31.8 g/dL (ref 30.0–36.0)
MCV: 83.3 fL (ref 80.0–100.0)
Platelets: 239 10*3/uL (ref 150–400)
RBC: 5.14 MIL/uL — ABNORMAL HIGH (ref 3.87–5.11)
RDW: 14.2 % (ref 11.5–15.5)
WBC: 4.2 10*3/uL (ref 4.0–10.5)
nRBC: 0 % (ref 0.0–0.2)

## 2023-06-30 LAB — MAGNESIUM: Magnesium: 2.4 mg/dL (ref 1.7–2.4)

## 2023-06-30 LAB — SURGICAL PCR SCREEN
MRSA, PCR: NEGATIVE
Staphylococcus aureus: NEGATIVE

## 2023-06-30 SURGERY — ICD IMPLANT

## 2023-06-30 MED ORDER — SODIUM CHLORIDE 0.9 % IV SOLN: INTRAVENOUS | Status: DC

## 2023-06-30 MED ORDER — MIDAZOLAM HCL 5 MG/5ML IJ SOLN
INTRAMUSCULAR | Status: AC
  Filled 2023-06-30: qty 5

## 2023-06-30 MED ORDER — CHLORHEXIDINE GLUCONATE 4 % EX SOLN
60.0000 mL | Freq: Once | CUTANEOUS | Status: AC
  Administered 2023-06-30: 4 via TOPICAL
  Filled 2023-06-30: qty 60

## 2023-06-30 MED ORDER — CEFAZOLIN SODIUM-DEXTROSE 2-4 GM/100ML-% IV SOLN
INTRAVENOUS | Status: AC
  Administered 2023-06-30: 2 g via INTRAVENOUS
  Filled 2023-06-30: qty 100

## 2023-06-30 MED ORDER — SACUBITRIL-VALSARTAN 97-103 MG PO TABS
1.0000 | ORAL_TABLET | Freq: Two times a day (BID) | ORAL | Status: DC
  Administered 2023-06-30 – 2023-07-01 (×2): 1 via ORAL
  Filled 2023-06-30 (×3): qty 1

## 2023-06-30 MED ORDER — LIDOCAINE HCL 1 % IJ SOLN
INTRAMUSCULAR | Status: AC
  Filled 2023-06-30: qty 60

## 2023-06-30 MED ORDER — LIDOCAINE HCL (PF) 1 % IJ SOLN
INTRAMUSCULAR | Status: DC | PRN
  Administered 2023-06-30: 60 mL

## 2023-06-30 MED ORDER — FUROSEMIDE 20 MG PO TABS
20.0000 mg | ORAL_TABLET | Freq: Every day | ORAL | Status: DC
  Administered 2023-07-01: 20 mg via ORAL
  Filled 2023-06-30: qty 1

## 2023-06-30 MED ORDER — FENTANYL CITRATE (PF) 100 MCG/2ML IJ SOLN
INTRAMUSCULAR | Status: DC | PRN
  Administered 2023-06-30: 25 ug via INTRAVENOUS
  Administered 2023-06-30: 12.5 ug via INTRAVENOUS

## 2023-06-30 MED ORDER — HEPARIN (PORCINE) IN NACL 1000-0.9 UT/500ML-% IV SOLN
INTRAVENOUS | Status: DC | PRN
  Administered 2023-06-30: 500 mL

## 2023-06-30 MED ORDER — SODIUM CHLORIDE 0.9% FLUSH
10.0000 mL | Freq: Two times a day (BID) | INTRAVENOUS | Status: DC
  Administered 2023-06-30: 10 mL via INTRAVENOUS

## 2023-06-30 MED ORDER — CHLORHEXIDINE GLUCONATE 4 % EX SOLN: 60.0000 mL | Freq: Once | CUTANEOUS | Status: DC

## 2023-06-30 MED ORDER — SODIUM CHLORIDE 0.9 % IV SOLN
80.0000 mg | INTRAVENOUS | Status: DC
  Filled 2023-06-30: qty 2

## 2023-06-30 MED ORDER — METOPROLOL TARTRATE 50 MG PO TABS
50.0000 mg | ORAL_TABLET | Freq: Two times a day (BID) | ORAL | Status: DC
  Administered 2023-06-30 – 2023-07-01 (×2): 50 mg via ORAL
  Filled 2023-06-30 (×2): qty 1

## 2023-06-30 MED ORDER — FENTANYL CITRATE (PF) 100 MCG/2ML IJ SOLN
INTRAMUSCULAR | Status: AC
  Filled 2023-06-30: qty 2

## 2023-06-30 MED ORDER — CEFAZOLIN SODIUM-DEXTROSE 2-4 GM/100ML-% IV SOLN: 2.0000 g | INTRAVENOUS | Status: AC

## 2023-06-30 MED ORDER — SODIUM CHLORIDE 0.9 % IV SOLN
INTRAVENOUS | Status: AC
  Filled 2023-06-30: qty 2

## 2023-06-30 MED ORDER — MIDAZOLAM HCL 5 MG/5ML IJ SOLN
INTRAMUSCULAR | Status: DC | PRN
  Administered 2023-06-30: .5 mg via INTRAVENOUS
  Administered 2023-06-30: 1 mg via INTRAVENOUS

## 2023-06-30 SURGICAL SUPPLY — 9 items
CABLE SURGICAL S-101-97-12 (CABLE) ×2 IMPLANT
ICD VIGILANT DR D233 (Pacemaker) IMPLANT
LEAD INGEVITY 7841 52 (Lead) IMPLANT
LEAD RELIANCE 0672 IMPLANT
PAD DEFIB RADIO PHYSIO CONN (PAD) ×2 IMPLANT
SHEATH 7FR PRELUDE SNAP 13 (SHEATH) IMPLANT
SHEATH 8FR PRELUDE SNAP 13 (SHEATH) IMPLANT
SHEATH PROBE COVER 6X72 (BAG) IMPLANT
TRAY PACEMAKER INSERTION (PACKS) ×2 IMPLANT

## 2023-06-30 NOTE — Progress Notes (Signed)
  Patient Name: Paula Wong Date of Encounter: 06/30/2023  Primary Cardiologist: Kristeen Miss, MD Electrophysiologist: None  Interval Summary   The patient is doing well today.  At this time, the patient denies chest pain, shortness of breath, or any new concerns.  Vital Signs    Vitals:   06/30/23 0300 06/30/23 0400 06/30/23 0500 06/30/23 0600  BP: (!) 109/55 (!) 113/54 (!) 109/55 (!) 99/59  Pulse: (!) 48 (!) 43 (!) 44 (!) 43  Resp: 12 15 16 12   Temp:  98 F (36.7 C)    TempSrc:  Oral    SpO2: 92% 91% 93% 92%  Weight:  83.9 kg    Height:        Intake/Output Summary (Last 24 hours) at 06/30/2023 6433 Last data filed at 06/30/2023 0600 Gross per 24 hour  Intake 672.65 ml  Output 500 ml  Net 172.65 ml   Filed Weights   06/27/23 2100 06/28/23 0500 06/30/23 0400  Weight: 83.8 kg 83.8 kg 83.9 kg    Physical Exam    GEN- The patient is well appearing, alert and oriented x 3 today.   Lungs- Clear to ausculation bilaterally, normal work of breathing Cardiac- Regular rate and rhythm, no murmurs, rubs or gallops GI- soft, NT, ND, + BS Extremities- no clubbing or cyanosis. No edema  Telemetry    Sinus bradycardia 40-50s (personally reviewed)  Hospital Course    Suha Maves is a 77 y.o. female admitted for Akshara Raxter is a 77 y.o. female with a known chronic systolic heart failure (EF 25 to 30%), multivessel coronary artery disease (40% ostial left main stenosis, 40% ostial LAD stenosis, 80% mid LAD stenosis, stenoses involving the origins of 2 large diagonal arteries, 70% RV branch stenosis), on a background of hypercholesterolemia, hypertension, history of remote stroke (August 2023).    Patient had an episode of sustained ventricular tachycardia associated with syncope.  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  Low suspicion for an ischemic etiology, but rather this is likely scar mediated reentry.  Her EF remains low despite guideline directed  medical therapy.  Appears relatively well compensated on exam.  Assessment & Plan    Ventricular tachycardia With syncope Continue oral amiodarone.  No ischemic s/s this admission, s/p PCI in May Explained risks, benefits, and alternatives to ICD implantation, including but not limited to bleeding, infection, pneumothorax, pericardial effusion, lead dislodgement, heart attack, stroke, or death.  Pt verbalized understanding and agrees to proceed pending lab availability.   CAD s/p PCI 12/2022 Denies s/s ischemia Troponin flat this admission  Chronic systolic CHF ICM EF remains < 29%.  Continue farxiga, toprol, entresto, and spiro as tolerated.   Sinus bradycardia Will likely need DDD device     For questions or updates, please contact [facility] Please consult [email or web address] for contact info under Cardiology/STEMI.  Signed, Graciella Freer, PA-C  06/30/2023, 7:02 AM

## 2023-06-30 NOTE — Discharge Instructions (Signed)
After Your ICD (Implantable Cardiac Defibrillator)   ACTIVITY Do not lift your arm above shoulder height for 1 week after your procedure. After 7 days, you may progress as below.  You should remove your sling 24 hours after your procedure, unless otherwise instructed by your provider.     Monday July 07, 2023  Tuesday July 08, 2023 Wednesday July 09, 2023 Thursday July 10, 2023   Do not lift, push, pull, or carry anything over 10 pounds with the affected arm until 6 weeks (Monday August 11, 2023 ) after your procedure.   You may drive AFTER your wound check, unless you have been told otherwise by your provider.   Ask your healthcare provider when you can go back to work   INCISION/Dressing If you are on a blood thinner such as Coumadin, Xarelto, Eliquis, Plavix, or Pradaxa please confirm with your provider when this should be resumed.   If large square, outer bandage is left in place, this can be removed after 24 hours from your procedure. Do not remove steri-strips or glue as below.   Monitor your defibrillator site for redness, swelling, and drainage. Call the device clinic at [phone] if you experience these symptoms or fever/chills.  If your incision is sealed with Steri-strips or staples, you may shower 7 days after your procedure or when told by your provider. Do not remove the steri-strips or let the shower hit directly on your site. You may wash around your site with soap and water.    If you were discharged in a sling, please do not wear this during the day more than 48 hours after your surgery unless otherwise instructed. This may increase the risk of stiffness and soreness in your shoulder.   Avoid lotions, ointments, or perfumes over your incision until it is well-healed.  You may use a hot tub or a pool AFTER your wound check appointment if the incision is completely closed.  Your ICD is designed to protect you from life threatening heart rhythms.  Because of this, you may receive a shock.   1 shock with no symptoms:  Call the office during business hours. 1 shock with symptoms (chest pain, chest pressure, dizziness, lightheadedness, shortness of breath, overall feeling unwell):  Call 911. If you experience 2 or more shocks in 24 hours:  Call 911. If you receive a shock, you should not drive for 6 months per the [place] DMV IF you receive appropriate therapy from your ICD.   ICD Alerts:  Some alerts are vibratory and others beep. These are NOT emergencies. Please call our office to let us know. If this occurs at night or on weekends, it can wait until the next business day. Send a remote transmission.  If your device is capable of reading fluid status (for heart failure), you will be offered monthly monitoring to review this with you.   DEVICE MANAGEMENT Remote monitoring is used to monitor your ICD from home. This monitoring is scheduled every 91 days by our office. It allows [place] to keep an eye on the functioning of your device to ensure it is working properly. You will routinely see your Electrophysiologist annually (more often if necessary).   You should receive your ID card for your new device in 4-8 weeks. Keep this card with you at all times once received. Consider wearing a medical alert bracelet or necklace.  Your ICD  may be MRI compatible. This will be discussed at your next office visit/wound check.  You should  avoid contact with strong electric or magnetic fields.   Do not use amateur (ham) radio equipment or electric (arc) welding torches. MP3 player headphones with magnets should not be used. Some devices are safe to use if held at least 12 inches (30 cm) from your defibrillator. These include power tools, lawn mowers, and speakers. If you are unsure if something is safe to use, ask your health care provider.  When using your cell phone, hold it to the ear that is on the opposite side from the defibrillator. Do not leave your cell  phone in a pocket over the defibrillator.  You may safely use electric blankets, heating pads, computers, and microwave ovens.  Call the office right away if: You have chest pain. You feel more than one shock. You feel more short of breath than you have felt before. You feel more light-headed than you have felt before. Your incision starts to open up.  This information is not intended to replace advice given to you by your health care provider. Make sure you discuss any questions you have with your health care provider.

## 2023-06-30 NOTE — Progress Notes (Signed)
Heart Failure Navigator Progress Note  Assessed for Heart & Vascular TOC clinic readiness.  Patient does not meet criteria due to Advanced Heart Failure Team patient of Dr. Sabharwal.   Navigator will sign off at this time.    Jaking Thayer, BSN, RN Heart Failure Nurse Navigator Secure Chat Only   

## 2023-07-01 ENCOUNTER — Inpatient Hospital Stay (HOSPITAL_COMMUNITY): Payer: 59

## 2023-07-01 ENCOUNTER — Other Ambulatory Visit (HOSPITAL_COMMUNITY): Payer: Self-pay

## 2023-07-01 ENCOUNTER — Encounter (HOSPITAL_COMMUNITY): Payer: Self-pay | Admitting: Cardiology

## 2023-07-01 DIAGNOSIS — I472 Ventricular tachycardia, unspecified: Secondary | ICD-10-CM | POA: Diagnosis not present

## 2023-07-01 LAB — BASIC METABOLIC PANEL
Anion gap: 9 (ref 5–15)
BUN: 13 mg/dL (ref 8–23)
CO2: 26 mmol/L (ref 22–32)
Calcium: 9.3 mg/dL (ref 8.9–10.3)
Chloride: 103 mmol/L (ref 98–111)
Creatinine, Ser: 1.1 mg/dL — ABNORMAL HIGH (ref 0.44–1.00)
GFR, Estimated: 51 mL/min — ABNORMAL LOW (ref >60)
Glucose, Bld: 111 mg/dL — ABNORMAL HIGH (ref 70–99)
Potassium: 4.8 mmol/L (ref 3.5–5.1)
Sodium: 138 mmol/L (ref 135–145)

## 2023-07-01 LAB — GLUCOSE, CAPILLARY
Glucose-Capillary: 135 mg/dL — ABNORMAL HIGH (ref 70–99)
Glucose-Capillary: 181 mg/dL — ABNORMAL HIGH (ref 70–99)

## 2023-07-01 LAB — MAGNESIUM: Magnesium: 2.2 mg/dL (ref 1.7–2.4)

## 2023-07-01 MED ORDER — AMIODARONE HCL 200 MG PO TABS
ORAL_TABLET | ORAL | 0 refills | Status: DC
  Filled 2023-07-01: qty 60, 40d supply, fill #0

## 2023-07-01 MED ORDER — ACETAMINOPHEN 325 MG PO TABS: 650.0000 mg | ORAL_TABLET | ORAL | Status: DC | PRN

## 2023-07-01 NOTE — Progress Notes (Signed)
Mobility Specialist Progress Note:   07/01/23 0900  Mobility  Activity Ambulated independently in hallway  Level of Assistance Standby assist, set-up cues, supervision of patient - no hands on  Assistive Device None  Distance Ambulated (ft) 300 ft  Activity Response Tolerated well  Mobility Referral Yes  $Mobility charge 1 Mobility  Mobility Specialist Start Time (ACUTE ONLY) 0837  Mobility Specialist Stop Time (ACUTE ONLY) 0847  Mobility Specialist Time Calculation (min) (ACUTE ONLY) 10 min    Pre Mobility: 63 HR,  94% SpO2 During Mobility: 81 HR,  95% SpO2 Post Mobility:  75 HR,  95% SpO2  Pt received in chair, agreeable to mobility. Asymptomatic throughout w/ no complaints. Returned to room w/o fault. Pt left chair with call bell and all needs met.   D'Vante Earlene Plater Mobility Specialist Please contact via Special educational needs teacher or Rehab office at [phone]

## 2023-07-01 NOTE — Care Management Important Message (Signed)
Important Message  Patient Details  Name: Paula Wong MRN: [id number] Date of Birth: [birth year]   Important Message Given:  Yes - Medicare IM  Patient discharged copy of IM mailed to patient home address.     [name] 07/01/2023, 2:15 PM

## 2023-07-01 NOTE — Discharge Summary (Signed)
ELECTROPHYSIOLOGY PROCEDURE DISCHARGE SUMMARY    Patient ID: Paula Wong,  MRN: [id number], DOB/AGE: [id number] 78 y.o.  Admit date: 06/27/2023 Discharge date: 07/01/2023  Primary Care Physician: Loura Back, NP  Primary Cardiologist: Kristeen Miss, MD  Electrophysiologist: Dr. Lalla Brothers    Primary Diagnosis:  Ventricular tachycardia with syncope  Secondary Diagnosis: CAD s/p PCI 12/2022 Chronic systolic CHF ICM Sinus bradycardia   No Known Allergies   Procedures This Admission:  1.  Implantation of a [employer] dual chamber ICD on 06/30/2023 by Dr. Lalla Brothers.  The patient received a Radiation protection practitioner DR [phone] with [employer] [id number] right atrial lead and [employer] [phone] right ventricular lead.  DFTs were deferred at time of implant There were no post procedure complications 2.  CXR on 07/01/2023 demonstrated no pneumothorax status post device implantation.      Brief HPI: Paula Wong is a 77 y.o. female was admitted for syncope and VT and consulted by electrophysiology  for consideration of ICD implantation.  Past medical history includes above.  The patient has persistent LV dysfunction despite guideline directed therapy.  Risks, benefits, and alternatives to ICD implantation were reviewed with the patient who wished to proceed.   Hospital Course:  The patient was admitted and underwent implantation of a [employer] dual chamber ICD with details as outlined above. She was started on amiodarone for her new VT. They were monitored on telemetry overnight which demonstrated NSR .  Left chest was without hematoma or ecchymosis.  The device was interrogated and found to be functioning normally.  CXR was obtained and demonstrated no pneumothorax status post device implantation..  Wound care, arm mobility, and restrictions were reviewed with the patient.  The patient was examined and considered stable for discharge to  home.   The patient's discharge medications include an ACE-I/ARB/ARNI Sherryll Burger) and beta blocker (Toprol).   Anticoagulation resumption Due to PCI within 6 months, Plavix continued without interruption. Pressure dressing placed prior to discharge with close follow up 11/14 to remove.   Physical Exam: Vitals:   06/30/23 2000 06/30/23 2313 07/01/23 0544 07/01/23 0755  BP: 127/78 119/69 129/78 110/65  Pulse:  (!) 59 68 64  Resp: 15 15 17 20   Temp: 98.7 F (37.1 C) 98.5 F (36.9 C) 97.7 F (36.5 C) 98 F (36.7 C)  TempSrc: Oral Oral Oral Oral  SpO2: 91% 93% 95% 94%  Weight:   81.3 kg   Height:        GEN- NAD. A&O x 3.  HEENT: Normocephalic, atraumatic Lungs- CTAB, normal effort.  Heart- RRR. No M/G/R.  GI- Soft, NT, ND.  Extremities- No clubbing, cyanosis, or edema Skin- Warm and dry, no rash or lesion. ICD site stable with pressure dressing applied prior to discharge.  Discharge Medications:  Allergies as of 07/01/2023   No Known Allergies      Medication List     TAKE these medications    acetaminophen 325 MG tablet Commonly known as: TYLENOL Take 2 tablets (650 mg total) by mouth every 4 (four) hours as needed for headache or mild pain (pain score 1-3).   amiodarone 200 MG tablet Commonly known as: PACERONE Take 2 tablets (400 mg total) by mouth 2 (two) times daily for 5 days, THEN 1 tablet (200 mg total) 2 (two) times daily for 5 days, THEN 1 tablet (200 mg total) daily. Start taking on: July 01, 2023   Aspirin Low Dose 81 MG chewable tablet  Generic drug: aspirin Chew 1 tablet (81 mg total) by mouth daily.   atorvastatin 80 MG tablet Commonly known as: LIPITOR Take 80 mg by mouth at bedtime.   clopidogrel 75 MG tablet Commonly known as: PLAVIX Take 1 tablet (75 mg total) by mouth daily.   Entresto 97-103 MG Generic drug: sacubitril-valsartan Take 1 tablet by mouth 2 (two) times daily.   Farxiga 10 MG Tabs tablet Generic drug: dapagliflozin  propanediol Take 10 mg by mouth daily.   furosemide 20 MG tablet Commonly known as: LASIX Take 1 tablet (20 mg total) by mouth daily. May take extra 20 mg as needed for fluid What changed: additional instructions   gabapentin 600 MG tablet Commonly known as: NEURONTIN Take 600 mg by mouth 2 (two) times daily.   magnesium oxide 400 MG tablet Commonly known as: MAG-OX Take 400 mg by mouth daily.   metFORMIN 500 MG 24 hr tablet Commonly known as: GLUCOPHAGE-XR Take 500 mg by mouth daily with breakfast.   metoprolol succinate 50 MG 24 hr tablet Commonly known as: TOPROL-XL Take 1 tablet (50 mg total) by mouth 2 (two) times daily.   nitroGLYCERIN 0.4 MG SL tablet Commonly known as: NITROSTAT Place 1 tablet (0.4 mg total) under the tongue every 5 (five) minutes x 3 doses as needed for chest pain.   OneTouch Ultra test strip Generic drug: glucose blood USE TO CHECK BLOOD SUGARS ONCE DAILY AND AS NEEDED   Ozempic (1 MG/DOSE) 4 MG/3ML Sopn Generic drug: Semaglutide (1 MG/DOSE) Inject 1 mg into the skin once a week.   pantoprazole 20 MG tablet Commonly known as: PROTONIX Take 1 tablet (20 mg total) by mouth daily.   spironolactone 25 MG tablet Commonly known as: ALDACTONE Take 1 tablet (25 mg total) by mouth daily.   Evaristo Bury FlexTouch 200 UNIT/ML FlexTouch Pen Generic drug: insulin degludec Take 80 Units by mouth at bedtime.        Disposition: Home with follow up as in AVS, Pressure dressing removal Thursday 11/14. Full wound check the next week.   Duration of Discharge Encounter: Greater than 30 minutes including physician time.  Dustin Flock, PA-C  07/01/2023 9:51 AM

## 2023-07-01 NOTE — Progress Notes (Signed)
Went over discharge paperwork with patient. All questions answered. PIV /telemetry removed. All belongings at bedside.

## 2023-07-02 ENCOUNTER — Telehealth (HOSPITAL_COMMUNITY): Payer: Self-pay | Admitting: Licensed Clinical Social Worker

## 2023-07-02 MED FILL — Gentamicin Sulfate Inj 40 MG/ML: INTRAMUSCULAR | Qty: 80 | Status: AC

## 2023-07-02 NOTE — Telephone Encounter (Signed)
CSW consulted to speak with pt regarding transportation concerns- CSW called pt but was unable to reach- left VM requesting return call  Burna Sis, LCSW Clinical Social Worker Advanced Heart Failure Clinic Desk#: [phone] Cell#: [phone]

## 2023-07-03 ENCOUNTER — Ambulatory Visit: Payer: 59 | Attending: Cardiovascular Disease

## 2023-07-03 ENCOUNTER — Telehealth (HOSPITAL_COMMUNITY): Payer: Self-pay | Admitting: Licensed Clinical Social Worker

## 2023-07-03 DIAGNOSIS — Z95810 Presence of automatic (implantable) cardiac defibrillator: Secondary | ICD-10-CM

## 2023-07-03 NOTE — Telephone Encounter (Signed)
H&V Care Navigation CSW Progress Note  Clinical Social Worker received call from pt that she needs help with ride to Cardiology appt today.  States she normally uses insurance benefit but appt was scheduled on short notice so didn't have time to call.  CSW able to arrange [employer] taxi to bring to appt- pick up from home 2:45pm   SDOH Screenings   Food Insecurity: No Food Insecurity (06/28/2023)  Housing: Low Risk  (06/28/2023)  Transportation Needs: Unmet Transportation Needs (07/03/2023)  Utilities: Not At Risk (06/28/2023)  Alcohol Screen: Low Risk  (01/07/2023)  Financial Resource Strain: Low Risk  (01/07/2023)  Tobacco Use: Medium Risk (06/27/2023)    07/03/2023  Paula Wong DOB: [birth year] MRN: [id number]   RIDER WAIVER AND RELEASE OF LIABILITY  For the purposes of helping with transportation needs, [facility] partners with outside transportation providers (taxi companies, [employer], Catering manager.) to give [employer] patients or other approved people the choice of on-demand rides [employer]") to our buildings for non-emergency visits.  By using [employer], I, the person signing this document, on behalf of myself and/or any legal minors (in my care using the [employer]), agree:  Science writer given to me are supplied by independent, outside transportation providers who do not work for, or have any affiliation with, [employer]. [employer] is not a transportation company. [facility] has no control over the quality or safety of the rides I get using [employer]. [place] has no control over whether any outside ride will happen on time or not. [facility] gives no guarantee on the reliability, quality, safety, or availability on any rides, or that no mistakes will happen. I know and accept that traveling by vehicle (car, truck, [employer], Zenaida Niece, bus, taxi, etc.) has risks of serious injuries such as disability, being paralyzed, and death. I know and agree the  risk of using [employer] is mine alone, and not [employer]. Transport Services are provided "as is" and as are available. The transportation providers are in charge for all inspections and care of the vehicles used to provide these rides. I agree not to take legal action against [employer], its agents, employees, officers, directors, representatives, insurers, attorneys, assigns, successors, subsidiaries, and affiliates at any time for any reasons related directly or indirectly to using [employer]. I also agree not to take legal action against [place] or its affiliates for any injury, death, or damage to property caused by or related to using [employer]. I have read this Waiver and Release of Liability, and I understand the terms used in it and their legal meaning. This Waiver is freely and voluntarily given with the understanding that my right (or any legal minors) to legal action against [place] relating to [employer] is knowingly given up to use these services.   I attest that I read the Ride Waiver and Release of Liability to Paula Wong, gave Ms. Babington the opportunity to ask questions and answered the questions asked (if any). I affirm that Paula Wong then provided consent for assistance with transportation.     Burna Sis

## 2023-07-03 NOTE — Patient Instructions (Signed)
   After Your Pacemaker   Monitor your pacemaker site for redness, swelling, and drainage. Call the device clinic at [phone] if you experience these symptoms or fever/chills.  Do not shower/get the device site area wet.   Plug in your remote monitor when you get home.  If any issues with the set up, please call our device clinic at above number.   Keep your appointment next week with our office for wound recheck and check on your new device.    Thank you for your visit today.

## 2023-07-04 NOTE — Progress Notes (Signed)
Patient in today -3 days post op all new dual ICD implant for removal of outer pressure dressing.  She is on ASA and Plavix with hx of new stent placement in May 2024.    Outer bandage removed, there is noted scant, dried blood on pad and steri-strips intact and in place.  No signs of infection.  There is small amount of swelling at device site.  Overall, patient is doing well and "happy to be here".    Dr. Elberta Fortis consulted for approval of wound site progression.  He is pleased with wound site presence. No need to hold medication.   Wound care (no shower instructions, lift/push/pull arm movement restrictions reviewed).  Also device clinic number given if patient notes any concerns.  Monitoring for increased swelling reviewed.   Patient verbalizes understanding of all instructions today.  She is aware of wound check appt with [place] on 07/10/23 for recheck and device interrogation.

## 2023-07-10 ENCOUNTER — Ambulatory Visit: Payer: 59 | Attending: Cardiovascular Disease

## 2023-07-10 DIAGNOSIS — I5022 Chronic systolic (congestive) heart failure: Secondary | ICD-10-CM

## 2023-07-10 DIAGNOSIS — I472 Ventricular tachycardia, unspecified: Secondary | ICD-10-CM | POA: Diagnosis not present

## 2023-07-10 LAB — CUP PACEART INCLINIC DEVICE CHECK
Date Time Interrogation Session: 20241121160220
HighPow Impedance: 76 Ohm
Implantable Lead Connection Status: 753985
Implantable Lead Connection Status: 753985
Implantable Lead Implant Date: 20241111
Implantable Lead Implant Date: 20241111
Implantable Lead Location: 753859
Implantable Lead Location: 753860
Implantable Lead Model: 672
Implantable Lead Model: 7841
Implantable Lead Serial Number: 1512046
Implantable Lead Serial Number: 262212
Implantable Pulse Generator Implant Date: 20241111
Lead Channel Impedance Value: 515 Ohm
Lead Channel Impedance Value: 640 Ohm
Lead Channel Pacing Threshold Amplitude: 0.5 V
Lead Channel Pacing Threshold Amplitude: 0.7 V
Lead Channel Pacing Threshold Pulse Width: 0.4 ms
Lead Channel Pacing Threshold Pulse Width: 0.4 ms
Lead Channel Sensing Intrinsic Amplitude: 25 mV
Lead Channel Sensing Intrinsic Amplitude: 5.4 mV
Lead Channel Setting Pacing Amplitude: 2 V
Lead Channel Setting Pacing Amplitude: 3.5 V
Lead Channel Setting Pacing Pulse Width: 0.4 ms
Lead Channel Setting Sensing Sensitivity: 0.6 mV
Pulse Gen Serial Number: 685817
Zone Setting Status: 755011

## 2023-07-10 NOTE — Patient Instructions (Signed)

## 2023-07-10 NOTE — Progress Notes (Signed)

## 2023-07-10 NOTE — Progress Notes (Signed)
Stitch noted at medial wound site.  Stitch removed without complication.

## 2023-07-29 ENCOUNTER — Ambulatory Visit (INDEPENDENT_AMBULATORY_CARE_PROVIDER_SITE_OTHER): Payer: 59 | Admitting: Podiatry

## 2023-07-29 ENCOUNTER — Encounter: Payer: Self-pay | Admitting: Podiatry

## 2023-07-29 DIAGNOSIS — M79676 Pain in unspecified toe(s): Secondary | ICD-10-CM | POA: Diagnosis not present

## 2023-07-29 DIAGNOSIS — B351 Tinea unguium: Secondary | ICD-10-CM

## 2023-07-29 DIAGNOSIS — E1169 Type 2 diabetes mellitus with other specified complication: Secondary | ICD-10-CM | POA: Diagnosis not present

## 2023-07-29 NOTE — Progress Notes (Signed)
  Subjective:  Patient ID: Paula Wong, female    DOB: 08/12/1946,   MRN: [id number]  Chief Complaint  Patient presents with   Nail Problem    rfc    77 y.o. female presents concern of thickened elongated and painful nails that are difficult to trim. Requesting to have them trimmed today. Relates burning and tingling in their feet. Patient is diabetic and last A1c was  Lab Results  Component Value Date   HGBA1C 6.2 (H) 03/06/2023   .  Does relates recent fracture of her foot and ankle after tripping over an extension cord. Relates she just got out of the boot and doing well.   PCP:  Loura Back, NP     PCP:  Loura Back, NP    . Denies any other pedal complaints. Denies n/v/f/c.   Past Medical History:  Diagnosis Date   Allergy    CAD (coronary artery disease)    a. 03/2022 Cath: LM 40ost, 56m, LAD 40ost/p, 9m, 23m, LCX 96m, RCA nl, RV branch 70-->Med rx.   Chronic HFrEF (heart failure with reduced ejection fraction) (HCC)    a. 11/2021 Echo: EF 20-25%; b. 03/2022 Echo: EF 20-25%; c. 11/2022 Echo: EF 25-30%, glob HK.   Diabetes (HCC)    Diverticulosis    GERD (gastroesophageal reflux disease)    Hiatal hernia    Hyperlipidemia    Hypertension    Implantable loop recorder present    a. 03/2022 s/p Abbott IQ EL+ (serial [id number]) ILR in setting of cryptogenic stroke.   Ischemic cardiomyopathy    a. 11/2021 Echo: EF 20-25%; b. 03/2022 Echo: EF 20-25%; c. 11/2022 Echo: EF 25-30%, glob HK, nl RV fxn, mild-mod MR, mild AI, AoV sclerosis.   Neuropathy    feet   Schatzki's ring    Stroke [facility])    a. 03/2022 MRI Brain: subacute cortical infarct involving inf and med L occipial pole w/ petechial hemorrhage; b. 03/2022 s/p Abbott Assert IQ EL ILR.   Ventricular tachycardia (HCC)    a. 12/2022 noted on ILR.    Objective:  Physical Exam: Vascular: DP/PT pulses 2/4 bilateral. CFT <3 seconds. Absent hair growth on digits. Edema noted to bilateral lower extremities. Xerosis noted  bilaterally.  Skin. No lacerations or abrasions bilateral feet. Nails 1-5 bilateral  are thickened discolored and elongated with subungual debris.  Musculoskeletal: MMT 5/5 bilateral lower extremities in DF, PF, Inversion and Eversion. Deceased ROM in DF of ankle joint. Mild tenderness and edema noted along lateral malleolus and fibula.  Neurological: Sensation intact to light touch. Protective sensation intact bilateral.    Assessment:   1. Pain due to onychomycosis of nail   2. Type 2 diabetes mellitus with other specified complication, without long-term current use of insulin (HCC)       Plan:  Patient was evaluated and treated and all questions answered. -Discussed and educated patient on diabetic foot care, especially with  regards to the vascular, neurological and musculoskeletal systems.  -Stressed the importance of good glycemic control and the detriment of not  controlling glucose levels in relation to the foot. -Discussed supportive shoes at all times and checking feet regularly.  -Mechanically debrided all nails 1-5 bilateral using sterile nail nipper and filed with dremel without incident  -Answered all patient questions -Patient to return  in 3 months for at risk foot care -Patient advised to call the office if any problems or questions arise in the meantime.    Louann Sjogren, DPM

## 2023-08-11 ENCOUNTER — Other Ambulatory Visit: Payer: Self-pay | Admitting: Internal Medicine

## 2023-08-11 DIAGNOSIS — K2100 Gastro-esophageal reflux disease with esophagitis, without bleeding: Secondary | ICD-10-CM

## 2023-08-22 ENCOUNTER — Ambulatory Visit (HOSPITAL_COMMUNITY)
Admission: RE | Admit: 2023-08-22 | Discharge: 2023-08-22 | Disposition: A | Discharge status: Home / Self Care | Payer: 59 | Source: Ambulatory Visit | Attending: Cardiology | Admitting: Cardiology

## 2023-08-22 VITALS — BP 126/78 | HR 80 | Wt 186.2 lb

## 2023-08-22 DIAGNOSIS — Z95810 Presence of automatic (implantable) cardiac defibrillator: Secondary | ICD-10-CM

## 2023-08-22 DIAGNOSIS — I5022 Chronic systolic (congestive) heart failure: Secondary | ICD-10-CM | POA: Diagnosis present

## 2023-08-22 DIAGNOSIS — I2510 Atherosclerotic heart disease of native coronary artery without angina pectoris: Secondary | ICD-10-CM | POA: Diagnosis not present

## 2023-08-22 DIAGNOSIS — I472 Ventricular tachycardia, unspecified: Secondary | ICD-10-CM

## 2023-08-22 LAB — BASIC METABOLIC PANEL
Anion gap: 10 (ref 5–15)
BUN: 7 mg/dL — ABNORMAL LOW (ref 8–23)
CO2: 25 mmol/L (ref 22–32)
Calcium: 9 mg/dL (ref 8.9–10.3)
Chloride: 109 mmol/L (ref 98–111)
Creatinine, Ser: 1.05 mg/dL — ABNORMAL HIGH (ref 0.44–1.00)
GFR, Estimated: 54 mL/min — ABNORMAL LOW (ref >60)
Glucose, Bld: 88 mg/dL (ref 70–99)
Potassium: 3.8 mmol/L (ref 3.5–5.1)
Sodium: 144 mmol/L (ref 135–145)

## 2023-08-22 LAB — BRAIN NATRIURETIC PEPTIDE: B Natriuretic Peptide: 327 pg/mL — ABNORMAL HIGH (ref 0.0–100.0)

## 2023-08-22 MED ORDER — CARVEDILOL 12.5 MG PO TABS
12.5000 mg | ORAL_TABLET | Freq: Two times a day (BID) | ORAL | 11 refills | Status: DC
Start: 2023-08-22 — End: 2023-11-04

## 2023-08-22 NOTE — Patient Instructions (Addendum)
 Labs done today. We will contact you only if your labs are abnormal.  STOP taking Toprol   START Carvedilol  12.5mg  (1 tablet) by mouth 2 times daily.  No other medication changes were made. Please continue all current medications as prescribed.  Your physician recommends that you schedule a follow-up appointment in: 2 months with a echo prior to your appointment.  Your physician has requested that you have an echocardiogram. Echocardiography is a painless test that uses sound waves to create images of your heart. It provides your doctor with information about the size and shape of your heart and how well your heart's chambers and valves are working. This procedure takes approximately one hour. There are no restrictions for this procedure. Please do NOT wear cologne, perfume, aftershave, or lotions (deodorant is allowed). Please arrive 15 minutes prior to your appointment time.  Please note: We ask at that you not bring children with you during ultrasound (echo/ vascular) testing. Due to room size and safety concerns, children are not allowed in the ultrasound rooms during exams. Our front office staff cannot provide observation of children in our lobby area while testing is being conducted. An adult accompanying a patient to their appointment will only be allowed in the ultrasound room at the discretion of the ultrasound technician under special circumstances. We apologize for any inconvenience.  If you have any questions or concerns before your next appointment please send us  a message through [place] or call our office at [phone].    TO LEAVE A MESSAGE FOR THE NURSE SELECT OPTION 2, PLEASE LEAVE A MESSAGE INCLUDING: YOUR NAME DATE OF BIRTH CALL BACK NUMBER REASON FOR CALL**this is important as we prioritize the call backs  YOU WILL RECEIVE A CALL BACK THE SAME DAY AS LONG AS YOU CALL BEFORE 4:00 PM   Do the following things EVERYDAY: Weigh yourself in the morning before breakfast.  Write it down and keep it in a log. Take your medicines as prescribed Eat low salt foods--Limit salt (sodium) to 2000 mg per day.  Stay as active as you can everyday Limit all fluids for the day to less than 2 liters   At the [facility], you and your health needs are our priority. As part of our continuing mission to provide you with exceptional heart care, we have created designated Provider Care Teams. These Care Teams include your primary Cardiologist (physician) and Advanced Practice Providers (APPs- Physician Assistants and Nurse Practitioners) who all work together to provide you with the care you need, when you need it.   You may see any of the following providers on your designated Care Team at your next follow up: Dr Toribio Fuel Dr Ezra Shuck Dr. Ria Gardenia Greig Lenetta, NP Caffie Shed, [place] [facility] [place], [place] Beckey Coe, NP Tinnie Redman, PharmD   Please be sure to bring in all your medications bottles to every appointment.    Thank you for choosing [facility]

## 2023-08-22 NOTE — Progress Notes (Signed)
 [facility] NOTE  Referring Physician: Leontine Cramp, NP  Primary Care: Leontine Cramp, NP Primary Cardiologist: Manus Passe, MD  HPI: Paula Wong is a 78 y.o. female with CAD, HFrEF, history of VT, type 2 diabetes, history of stroke, hypertension, hyperlipidemia presenting today to establish care.  She was admitted to [facility] for CVA in August 2023.  She had an echocardiogram at that time that demonstrated EF of 20 to 25%.  This was followed by left heart catheterization with 40% left main, 40% ostial LAD and 80% mid LAD disease.  Her cardiomyopathy was felt to be out of proportion to CAD and she was treated medically at that time with placement of implantable loop recorder.  In April 2024 she had a follow-up echo with persistently reduced EF and moderate mitral regurgitation.  In May 2024 she was admitted after an episode of sustained polymorphic VT that was felt to be ischemic.  She was started on IV amiodarone  with repeat left heart catheterization demonstrating persistently severe disease now status post PCI to the mid LAD the lesion.  She was readmitted in 01/06/2023 with acute hypoxic respiratory failure due to decompensated heart failure.  Since that time she has been seen in [facility] clinic where medications have been further uptitrated.  Interval hx:  - She is now s/p ICD placement in November 2024 - From a functional standpoint, she has been feeling very well. She has no shortness of breath, LE edema,   Activity level/exercise tolerance:  NYHA IIB, although, I believe she is overstating her exercise capacity Orthopnea:  Sleeps on 2 pillows Paroxysmal noctural dyspnea:  no Chest pain/pressure:  no Orthostatic lightheadedness:  no Palpitations:  no Lower extremity edema:  no Presyncope/syncope:  no Cough:  no  Past Medical History:  Diagnosis Date   Allergy    CAD (coronary artery disease)    a. 03/2022 Cath: LM 40ost, 52m, LAD 40ost/p, 108m, 50m, LCX 52m, RCA  nl, RV branch 70-->Med rx.   Chronic HFrEF (heart failure with reduced ejection fraction) (HCC)    a. 11/2021 Echo: EF 20-25%; b. 03/2022 Echo: EF 20-25%; c. 11/2022 Echo: EF 25-30%, glob HK.   Diabetes (HCC)    Diverticulosis    GERD (gastroesophageal reflux disease)    Hiatal hernia    Hyperlipidemia    Hypertension    Implantable loop recorder present    a. 03/2022 s/p Abbott IQ EL+ (serial [id number]) ILR in setting of cryptogenic stroke.   Ischemic cardiomyopathy    a. 11/2021 Echo: EF 20-25%; b. 03/2022 Echo: EF 20-25%; c. 11/2022 Echo: EF 25-30%, glob HK, nl RV fxn, mild-mod MR, mild AI, AoV sclerosis.   Neuropathy    feet   Schatzki's ring    Stroke [facility])    a. 03/2022 MRI Brain: subacute cortical infarct involving inf and med L occipial pole w/ petechial hemorrhage; b. 03/2022 s/p Abbott Assert IQ EL ILR.   Ventricular tachycardia (HCC)    a. 12/2022 noted on ILR.    Current Outpatient Medications  Medication Sig Dispense Refill   acetaminophen  (TYLENOL ) 325 MG tablet Take 2 tablets (650 mg total) by mouth every 4 (four) hours as needed for headache or mild pain (pain score 1-3).     amiodarone  (PACERONE ) 200 MG tablet Take 2 tablets (400 mg total) by mouth 2 (two) times daily for 5 days, THEN 1 tablet (200 mg total) 2 (two) times daily for 5 days, THEN 1 tablet (200 mg total) daily. 60  tablet 0   aspirin  81 MG chewable tablet Chew 1 tablet (81 mg total) by mouth daily. 90 tablet 3   atorvastatin  (LIPITOR ) 80 MG tablet Take 80 mg by mouth at bedtime.     clopidogrel  (PLAVIX ) 75 MG tablet Take 1 tablet (75 mg total) by mouth daily. 30 tablet 11   FARXIGA  10 MG TABS tablet Take 10 mg by mouth daily.     furosemide  (LASIX ) 20 MG tablet Take 1 tablet (20 mg total) by mouth daily. May take extra 20 mg as needed for fluid (Patient taking differently: Take 20 mg by mouth daily.) 90 tablet 3   gabapentin  (NEURONTIN ) 600 MG tablet Take 600 mg by mouth 2 (two) times daily.     magnesium  oxide  (MAG-OX) 400 MG tablet Take 400 mg by mouth daily.     metFORMIN  (GLUCOPHAGE -XR) 500 MG 24 hr tablet Take 500 mg by mouth daily with breakfast.     metoprolol  succinate (TOPROL -XL) 50 MG 24 hr tablet Take 1 tablet (50 mg total) by mouth 2 (two) times daily. 180 tablet 3   nitroGLYCERIN  (NITROSTAT ) 0.4 MG SL tablet Place 1 tablet (0.4 mg total) under the tongue every 5 (five) minutes x 3 doses as needed for chest pain. 25 tablet 2   ONETOUCH ULTRA test strip USE TO CHECK BLOOD SUGARS ONCE DAILY AND AS NEEDED     OZEMPIC, 1 MG/DOSE, 4 MG/3ML SOPN Inject 1 mg into the skin once a week.     pantoprazole  (PROTONIX ) 20 MG tablet Take 1 tablet by mouth once daily 90 tablet 0   sacubitril -valsartan  (ENTRESTO ) 97-103 MG Take 1 tablet by mouth 2 (two) times daily. 60 tablet 5   spironolactone  (ALDACTONE ) 25 MG tablet Take 1 tablet (25 mg total) by mouth daily. 90 tablet 3   TRESIBA FLEXTOUCH 200 UNIT/ML SOPN Take 80 Units by mouth at bedtime.      No current facility-administered medications for this encounter.    No Known Allergies    Social History   Socioeconomic History   Marital status: Single    Spouse name: Not on file   Number of children: 2   Years of education: Not on file   Highest education level: High school graduate  Occupational History   Occupation: Retired  Tobacco Use   Smoking status: Former    Current packs/day: 0.00    Types: Cigarettes    Quit date: 08/20/2003    Years since quitting: 20.0   Smokeless tobacco: Never  Vaping Use   Vaping status: Never Used  Substance and Sexual Activity   Alcohol use: No   Drug use: No   Sexual activity: Not on file  Other Topics Concern   Not on file  Social History Narrative   Not on file   Social Drivers of Health   Financial Resource Strain: Low Risk  (01/07/2023)   Overall Financial Resource Strain (CARDIA)    Difficulty of Paying Living Expenses: Not hard at all  Food Insecurity: No Food Insecurity (06/28/2023)   Hunger  Vital Sign    Worried About Running Out of Food in the Last Year: Never true    Ran Out of Food in the Last Year: Never true  Transportation Needs: Unmet Transportation Needs (07/03/2023)   PRAPARE - Administrator, Civil Service (Medical): Yes    Lack of Transportation (Non-Medical): No  Physical Activity: Not on file  Stress: Not on file  Social Connections: Not on file  Intimate Partner Violence: Not At Risk (06/28/2023)   Humiliation, Afraid, Rape, and Kick questionnaire    Fear of Current or Ex-Partner: No    Emotionally Abused: No    Physically Abused: No    Sexually Abused: No      Family History  Problem Relation Age of Onset   Colon cancer Mother 71   Heart attack Father    Cancer Sister        female cancer   Heart attack Brother    Stomach cancer Neg Hx    Pancreatic cancer Neg Hx    Esophageal cancer Neg Hx     PHYSICAL EXAM: Vitals:   08/22/23 1357  BP: 126/78  Pulse: 80  SpO2: 95%   GENERAL: Well nourished, well developed, and in no apparent distress at rest.  HEENT: There is no scleral icterus.  The mucous membranes are pink and moist.   CHEST: There are no chest wall deformities. There is no chest wall tenderness. Respirations are unlabored.  Lungs- CTA B/L CARDIAC:  JVP: 7 cm          Normal rate with regular rhythm. No murmur.  Pulses are 2+ and symmetrical in upper and lower extremities. No edema.  ABDOMEN: Soft, non-tender, non-distended. There are normal bowel sounds.  EXTREMITIES: Warm and well perfused.  NEUROLOGIC: Patient is oriented x3 with no obvious focal neurologic deficits.  PSYCH: Patients affect is appropriate SKIN: Warm and dry; no lesions or wounds.     DATA REVIEW  ECG: 01/24/23: sinus bradycardia as per my personal interpretation 05/09/23: NSR  ECHO: 12/06/22: LVEF 25%, normal RV function as per my personal interpretation  CATH: 12/31/22:   Mid Cx lesion is 5% stenosed.   Ost LAD to Prox LAD lesion is 40%  stenosed.   Mid LAD-2 lesion is 30% stenosed.   Ost LM lesion is 40% stenosed.   Mid LM to Dist LM lesion is 30% stenosed.   RV Branch lesion is 70% stenosed.   Prox LAD to Mid LAD lesion is 85% stenosed.   Mid LAD-1 lesion is 85% stenosed.   A drug-eluting stent was successfully placed using a SYNERGY XD 3.0X24.   Post intervention, there is a 0% residual stenosis.   Post intervention, there is a 0% residual stenosis.   LV end diastolic pressure is moderately elevated.   2 vessel obstructive CAD with severe LAD stenosis sequentially with heavy calcification. Moderate RCA disease diffusely in a small branch Moderately elevated LVEDP 25 mm Hg Successful PCI of the mid LAD using IVUS guidance, Shockwave intracoronary lithotripsy and DES x 1.  03/27/22:  The left main has 40% ostial 30% distal stenosis; the LAD has 40% ostial to proximal stenosis followed by focal calcified 80% stenosis prior to the takeoff of a septal perforator and the second diagonal vessel with 30% mid stenosis; mild nonobstructive disease in the left circumflex vessel, and diffuse 70% stenosis in the marginal branch of the RCA.     ASSESSMENT & PLAN:  Heart failure with reduced ejection fraction Etiology of YQ:pdryzfpr cardiomyopathy; plan for CMR to re-evaluate LVEFwith CMR.  NYHA class / AHA Stage: NYHA II, however likely understating the degree of her symptoms. Volume status & Diuretics: Euvolemic, continue Lasix  20 mg daily with repeat labs today Vasodilators:Continue Entresto  97/103mg  BID Beta-Blocker: D/C toprol ; start coreg  12.5mg  BID MRA:spironolactone  25mg  Cardiometabolic:Farxiga  10mg  Devices therapies & Valvulopathies: s/p primary prevention ICD Advanced therapies: Not indicated.  Limited by age will continue aggressive medical management.  2. Hypertension  - well controlled today, see above.  - Repeat labs - start coreg  12.5mg  BID  3. CAD -Multivessel CAD with PCI to the LAD as noted above.   Continue Plavix  for 1 year -Lipid for 80 mg daily with aspirin  81 mg - No chest pain; diong well.   4. VT -Polymorphic VT noted on loop recorder 05/24. QT prolonged on admit.  -Seen by EP.  Suspected d/t CAD. Held off on ICD d/t recent stent placement. -Completing amiodarone  taper. TSH and LFTs okay in 05/24. - no palpitations; device interrogation today w/o VT   5. 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 Seen by Neurology -Loop recorder was placed 08/23.  No AF documented -Has been on aspirin , plavix  and statin  I spent 35 minutes caring for this patient today including face to face time, ordering and reviewing labs, device interrogation, seeing the patient, documenting in the record, and arranging follow ups.    Ally Knodel [facility] Mechanical Circulatory Support

## 2023-09-30 ENCOUNTER — Other Ambulatory Visit (HOSPITAL_COMMUNITY): Payer: Self-pay

## 2023-09-30 ENCOUNTER — Ambulatory Visit (INDEPENDENT_AMBULATORY_CARE_PROVIDER_SITE_OTHER): Payer: 59

## 2023-09-30 DIAGNOSIS — I472 Ventricular tachycardia, unspecified: Secondary | ICD-10-CM

## 2023-09-30 DIAGNOSIS — I5022 Chronic systolic (congestive) heart failure: Secondary | ICD-10-CM

## 2023-10-02 ENCOUNTER — Other Ambulatory Visit: Payer: Self-pay

## 2023-10-02 ENCOUNTER — Ambulatory Visit: Payer: 59 | Attending: Cardiology | Admitting: Cardiology

## 2023-10-02 ENCOUNTER — Encounter: Payer: Self-pay | Admitting: Cardiology

## 2023-10-02 VITALS — BP 122/72 | HR 89 | Ht 66.0 in | Wt 190.2 lb

## 2023-10-02 DIAGNOSIS — Z95810 Presence of automatic (implantable) cardiac defibrillator: Secondary | ICD-10-CM

## 2023-10-02 DIAGNOSIS — I5022 Chronic systolic (congestive) heart failure: Secondary | ICD-10-CM | POA: Diagnosis not present

## 2023-10-02 DIAGNOSIS — I472 Ventricular tachycardia, unspecified: Secondary | ICD-10-CM

## 2023-10-02 DIAGNOSIS — Z79899 Other long term (current) drug therapy: Secondary | ICD-10-CM

## 2023-10-02 IMAGING — MG MM DIGITAL SCREENING BILAT W/ TOMO AND CAD
8 series · 8 of 24 positions shown · non-contrast
Comparison: Previous exam(s).

CLINICAL DATA: Screening.

EXAM:
DIGITAL SCREENING BILATERAL MAMMOGRAM WITH TOMOSYNTHESIS AND CAD
TECHNIQUE: Bilateral screening digital craniocaudal and mediolateral oblique
mammograms were obtained. Bilateral screening digital breast
tomosynthesis was performed. The images were evaluated with
computer-aided detection.

[R MLO synth-2D]
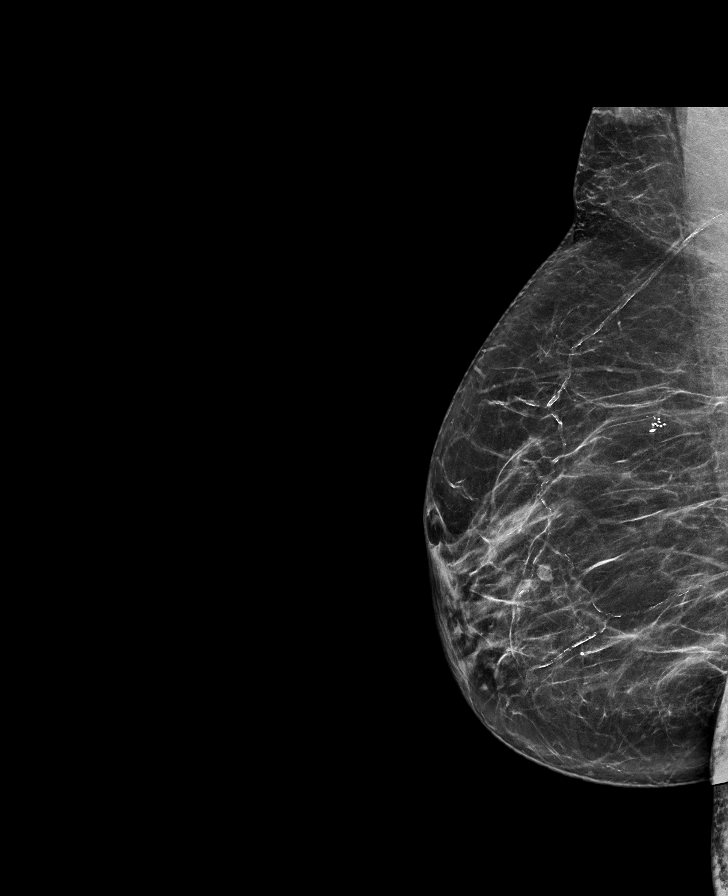

[R CC synth-2D]
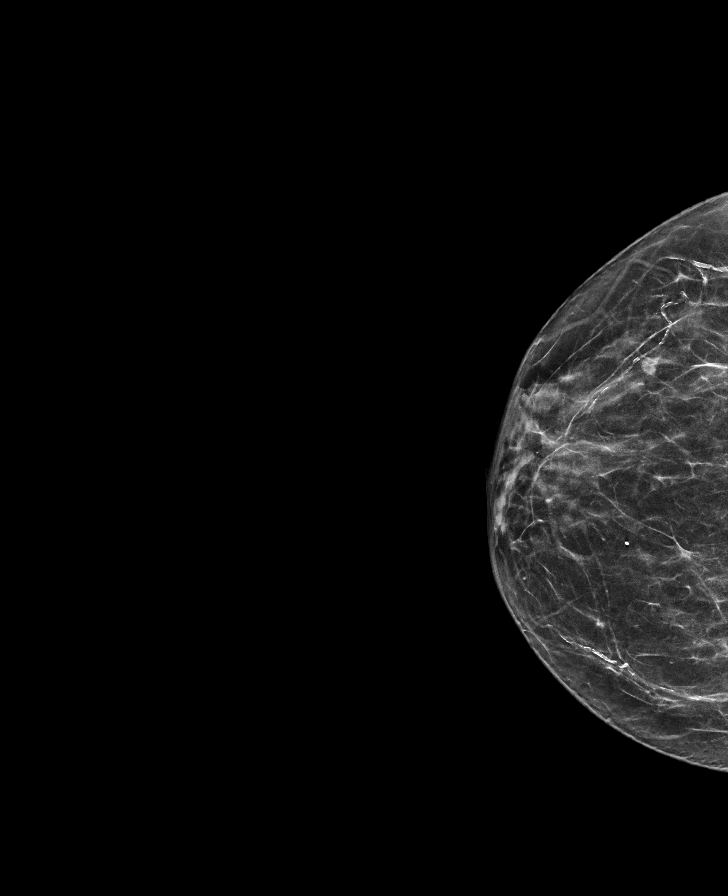

[L CC synth-2D]
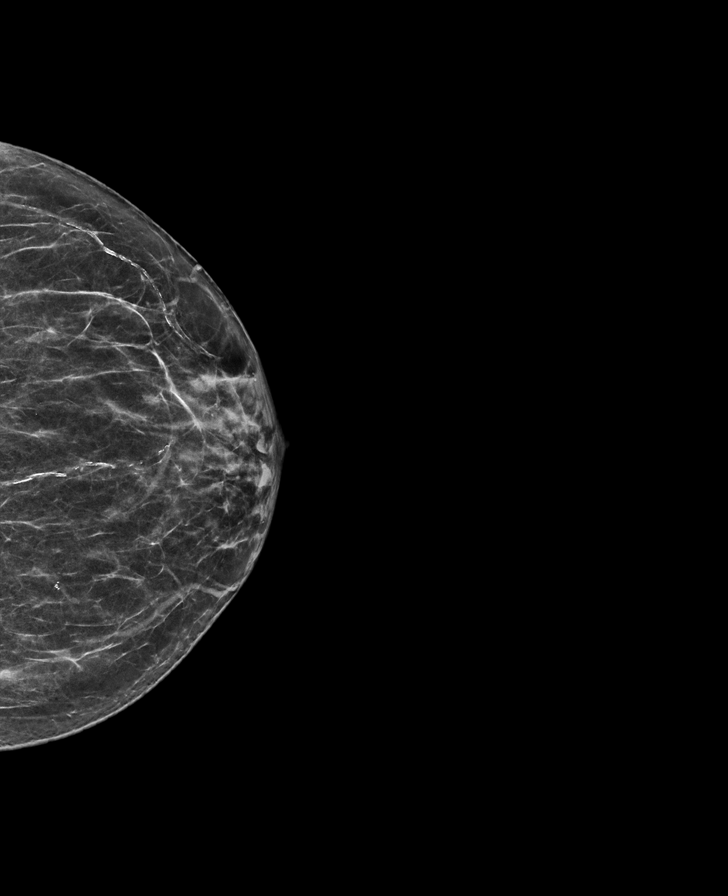

[L MLO synth-2D]
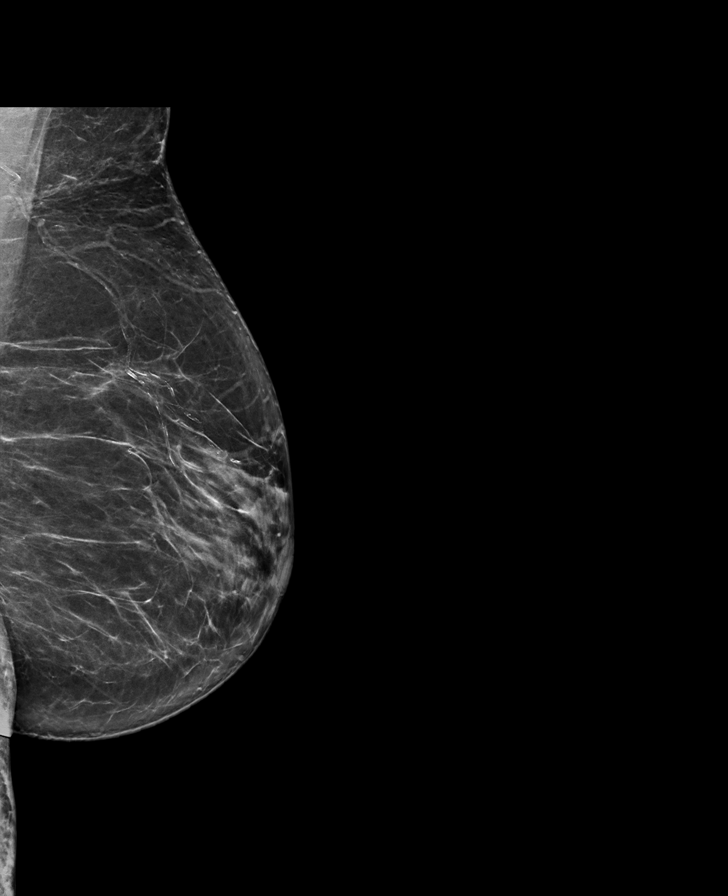

[L CC tomo · tomo slice 29/58.0]
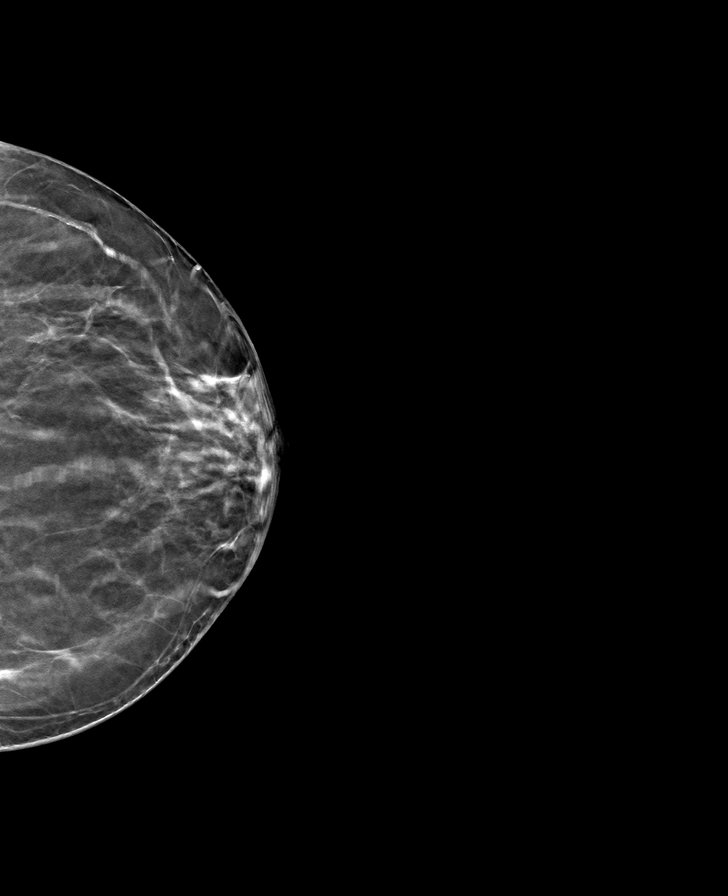

[R MLO tomo · tomo slice 36/71.0]
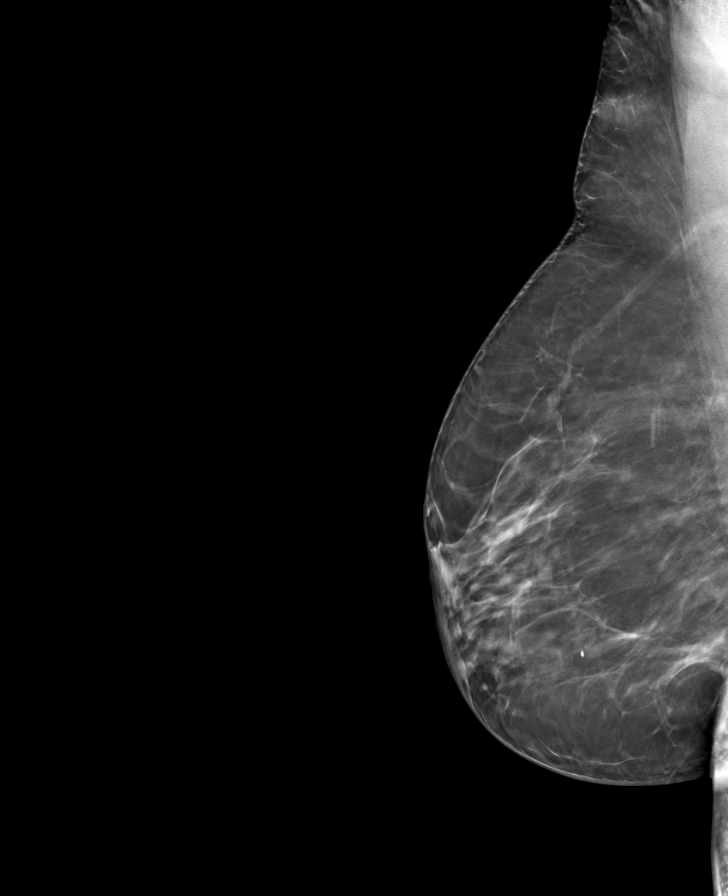

[R CC tomo · tomo slice 29/57.0]
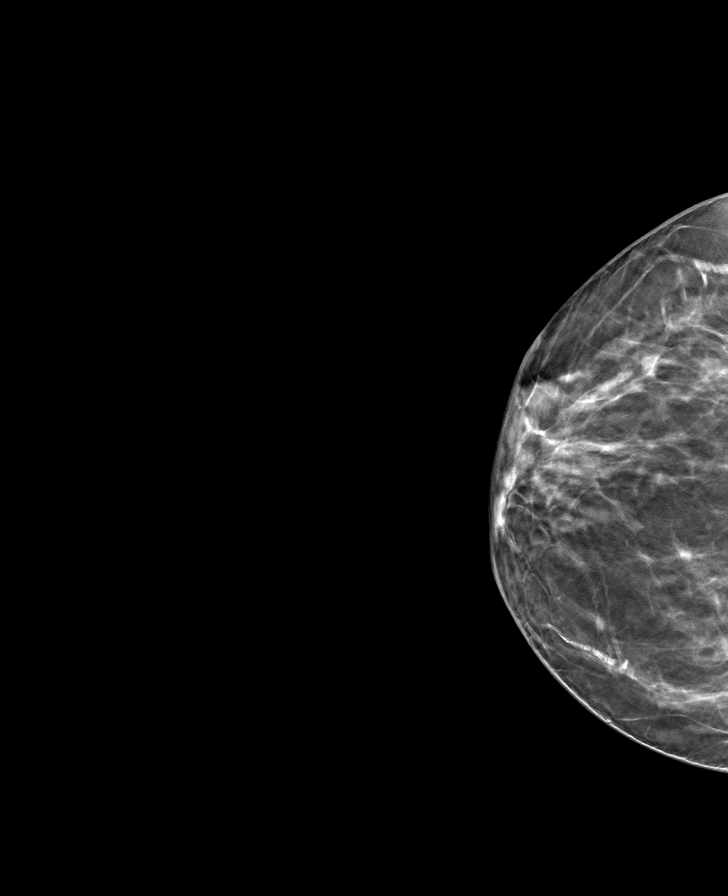

[L MLO tomo · tomo slice 37/72.0]
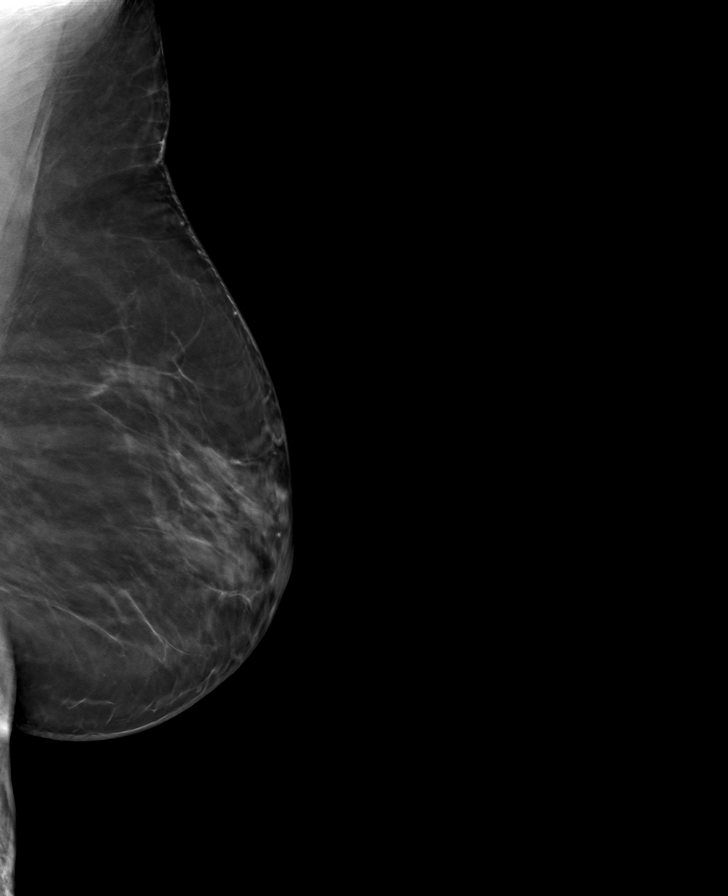

[8 of 24 positions shown; findings below may reference images not displayed]

ACR Breast Density Category b: There are scattered areas of
fibroglandular density.
FINDINGS: There are no findings suspicious for malignancy.
IMPRESSION: No mammographic evidence of malignancy. A result letter of this
screening mammogram will be mailed directly to the patient.

RECOMMENDATION:
Screening mammogram in one year. (Code:51-O-LD2)

BI-RADS CATEGORY  1: Negative.

## 2023-10-02 NOTE — Progress Notes (Signed)
  Electrophysiology Office Follow up Visit Note:    Date:  10/02/2023   ID:  Charlesetta Ivory, DOB 1945-09-30, MRN [id number]  PCP:  Loura Back, NP  [facility] Cardiologist:  Kristeen Miss, MD  [facility] Electrophysiologist:  Lanier Prude, MD    Interval History:     Paula Wong is a 78 y.o. female who presents for a follow up visit.   She presents for ICD follow up. ICD implanted for secondary prevention. She has done well since ICD implant. Wound healed well.        Past medical, surgical, social and family history were reviewed.  ROS:   Please see the history of present illness.    All other systems reviewed and are negative.  EKGs/Labs/Other Studies Reviewed:    The following studies were reviewed today:  10/02/2023 in clinic device interrogation personally reviewed Battery longevity 13 years Lead parameters stable 23% A pacing <1% V pacing   EKG Interpretation Date/Time:  Thursday October 02 2023 16:35:22 EST Ventricular Rate:  89 PR Interval:  186 QRS Duration:  120 QT Interval:  436 QTC Calculation: 530 R Axis:   -24  Text Interpretation: Sinus rhythm with occasional Premature ventricular complexes Confirmed by Steffanie Dunn [phone]) on 10/02/2023 8:05:13 PM    ECG performed today to assess frequency of PVC's.  Physical Exam:    VS:  BP 122/72   Pulse 89   Ht 5\' 6"  (1.676 m)   Wt 190 lb 3.2 oz (86.3 kg)   SpO2 97%   BMI 30.70 kg/m     Wt Readings from Last 3 Encounters:  10/02/23 190 lb 3.2 oz (86.3 kg)  08/22/23 186 lb 3.2 oz (84.5 kg)  07/01/23 179 lb 4.8 oz (81.3 kg)     GEN: no distress CARD: RRR, No MRG RESP: No IWOB. CTAB.      ASSESSMENT:    1. VT (ventricular tachycardia) (HCC)   2. Chronic HFrEF (heart failure with reduced ejection fraction) (HCC)   3. ICD (implantable cardioverter-defibrillator) in place   4. Encounter for long-term (current) use of high-risk medication    PLAN:    In order of  problems listed above:  #Chronic systolic heart failure NYHA II. Warm and dry today. EF<20% on most recent echo Cont coreg 12.5mg  PO BID Cont farxiga 10mg  daily Cont lasix 20mg  PO daily Cont entresto daily Cont spironolactone 25mg  po daily  #VT #High risk drug monitoring - amiodarone Patient's heart rhythm has remained stable. No ICD shocks.  Needs repeat CMP, TSH and FT4 today.  Follow up 6 months with APP.   Signed, Steffanie Dunn, MD, [facility], [facility] 10/02/2023 8:08 PM    Electrophysiology [facility]

## 2023-10-02 NOTE — Patient Instructions (Signed)
Medication Instructions:  Your physician recommends that you continue on your current medications as directed. Please refer to the Current Medication list given to you today.  *If you need a refill on your cardiac medications before your next appointment, please call your pharmacy*   Lab Work: CMET, TSH, T4 - you may go to any [facility] location to have  Follow-Up: At [facility], you and your health needs are our priority.  As part of our continuing mission to provide you with exceptional heart care, we have created designated Provider Care Teams.  These Care Teams include your primary Cardiologist (physician) and Advanced Practice Providers (APPs -  Physician Assistants and Nurse Practitioners) who all work together to provide you with the care you need, when you need it.  Your next appointment:   6 month(s)  Provider:   You will see one of the following Advanced Practice Providers on your designated Care Team:   Francis Dowse, Charlott Holler [address] [place], [place] Sherie Don, NP Canary Brim, NP

## 2023-10-07 LAB — CUP PACEART REMOTE DEVICE CHECK
Battery Remaining Longevity: 156 mo
Battery Remaining Percentage: 100 %
Brady Statistic RA Percent Paced: 24 %
Brady Statistic RV Percent Paced: 0 %
Date Time Interrogation Session: 20250211001000
HighPow Impedance: 77 Ohm
Implantable Lead Connection Status: 753985
Implantable Lead Connection Status: 753985
Implantable Lead Implant Date: 20241111
Implantable Lead Implant Date: 20241111
Implantable Lead Location: 753859
Implantable Lead Location: 753860
Implantable Lead Model: 672
Implantable Lead Model: 7841
Implantable Lead Serial Number: 1512046
Implantable Lead Serial Number: 262212
Implantable Pulse Generator Implant Date: 20241111
Lead Channel Impedance Value: 490 Ohm
Lead Channel Impedance Value: 711 Ohm
Lead Channel Pacing Threshold Amplitude: 0.7 V
Lead Channel Pacing Threshold Pulse Width: 0.4 ms
Lead Channel Setting Pacing Amplitude: 2 V
Lead Channel Setting Pacing Amplitude: 3.5 V
Lead Channel Setting Pacing Pulse Width: 0.4 ms
Lead Channel Setting Sensing Sensitivity: 0.6 mV
Pulse Gen Serial Number: 685817
Zone Setting Status: 755011

## 2023-10-07 LAB — COMPREHENSIVE METABOLIC PANEL
ALT: 7 [IU]/L (ref 0–32)
AST: 13 [IU]/L (ref 0–40)
Albumin: 4.1 g/dL (ref 3.8–4.8)
Alkaline Phosphatase: 81 [IU]/L (ref 44–121)
BUN/Creatinine Ratio: 11 — ABNORMAL LOW (ref 12–28)
BUN: 12 mg/dL (ref 8–27)
Bilirubin Total: 1 mg/dL (ref 0.0–1.2)
CO2: 24 mmol/L (ref 20–29)
Calcium: 8.9 mg/dL (ref 8.7–10.3)
Chloride: 107 mmol/L — ABNORMAL HIGH (ref 96–106)
Creatinine, Ser: 1.05 mg/dL — ABNORMAL HIGH (ref 0.57–1.00)
Globulin, Total: 2.6 g/dL (ref 1.5–4.5)
Glucose: 113 mg/dL — ABNORMAL HIGH (ref 70–99)
Potassium: 3.6 mmol/L (ref 3.5–5.2)
Sodium: 146 mmol/L — ABNORMAL HIGH (ref 134–144)
Total Protein: 6.7 g/dL (ref 6.0–8.5)
eGFR: 54 mL/min/{1.73_m2} — ABNORMAL LOW (ref >59)

## 2023-10-07 LAB — T4, FREE: Free T4: 1.1 ng/dL (ref 0.82–1.77)

## 2023-10-07 LAB — TSH: TSH: 1.44 u[IU]/mL (ref 0.450–4.500)

## 2023-10-27 ENCOUNTER — Encounter: Payer: Self-pay | Admitting: Podiatry

## 2023-10-27 ENCOUNTER — Ambulatory Visit (INDEPENDENT_AMBULATORY_CARE_PROVIDER_SITE_OTHER): Payer: 59 | Admitting: Podiatry

## 2023-10-27 DIAGNOSIS — B351 Tinea unguium: Secondary | ICD-10-CM

## 2023-10-27 DIAGNOSIS — M79676 Pain in unspecified toe(s): Secondary | ICD-10-CM

## 2023-10-27 DIAGNOSIS — E1169 Type 2 diabetes mellitus with other specified complication: Secondary | ICD-10-CM

## 2023-10-27 NOTE — Progress Notes (Signed)
  Subjective:  Patient ID: Paula Wong, female    DOB: 16-Nov-1945,   MRN: [id number]  Chief Complaint  Patient presents with   Nail Problem    RFC    78 y.o. female presents concern of thickened elongated and painful nails that are difficult to trim. Requesting to have them trimmed today. Relates burning and tingling in their feet. Patient is diabetic and last A1c was  Lab Results  Component Value Date   HGBA1C 6.2 (H) 03/06/2023   .  Does relates recent fracture of her foot and ankle after tripping over an extension cord. Relates she just got out of the boot and doing well.   PCP:  Loura Back, NP     PCP:  Loura Back, NP    . Denies any other pedal complaints. Denies n/v/f/c.   Past Medical History:  Diagnosis Date   Allergy    CAD (coronary artery disease)    a. 03/2022 Cath: LM 40ost, 34m, LAD 40ost/p, 69m, 2m, LCX 64m, RCA nl, RV branch 70-->Med rx.   Chronic HFrEF (heart failure with reduced ejection fraction) (HCC)    a. 11/2021 Echo: EF 20-25%; b. 03/2022 Echo: EF 20-25%; c. 11/2022 Echo: EF 25-30%, glob HK.   Diabetes (HCC)    Diverticulosis    GERD (gastroesophageal reflux disease)    Hiatal hernia    Hyperlipidemia    Hypertension    Implantable loop recorder present    a. 03/2022 s/p Abbott IQ EL+ (serial [id number]) ILR in setting of cryptogenic stroke.   Ischemic cardiomyopathy    a. 11/2021 Echo: EF 20-25%; b. 03/2022 Echo: EF 20-25%; c. 11/2022 Echo: EF 25-30%, glob HK, nl RV fxn, mild-mod MR, mild AI, AoV sclerosis.   Neuropathy    feet   Schatzki's ring    Stroke [facility])    a. 03/2022 MRI Brain: subacute cortical infarct involving inf and med L occipial pole w/ petechial hemorrhage; b. 03/2022 s/p Abbott Assert IQ EL ILR.   Ventricular tachycardia (HCC)    a. 12/2022 noted on ILR.    Objective:  Physical Exam: Vascular: DP/PT pulses 2/4 bilateral. CFT <3 seconds. Absent hair growth on digits. Edema noted to bilateral lower extremities. Xerosis noted  bilaterally.  Skin. No lacerations or abrasions bilateral feet. Nails 1-5 bilateral  are thickened discolored and elongated with subungual debris.  Musculoskeletal: MMT 5/5 bilateral lower extremities in DF, PF, Inversion and Eversion. Deceased ROM in DF of ankle joint. Mild tenderness and edema noted along lateral malleolus and fibula.  Neurological: Sensation intact to light touch. Protective sensation intact bilateral.    Assessment:   1. Pain due to onychomycosis of nail   2. Type 2 diabetes mellitus with other specified complication, without long-term current use of insulin (HCC)       Plan:  Patient was evaluated and treated and all questions answered. -Discussed and educated patient on diabetic foot care, especially with  regards to the vascular, neurological and musculoskeletal systems.  -Stressed the importance of good glycemic control and the detriment of not  controlling glucose levels in relation to the foot. -Discussed supportive shoes at all times and checking feet regularly.  -Mechanically debrided all nails 1-5 bilateral using sterile nail nipper and filed with dremel without incident  -Answered all patient questions -Patient to return  in 3 months for at risk foot care -Patient advised to call the office if any problems or questions arise in the meantime.    Louann Sjogren, DPM

## 2023-11-03 NOTE — Progress Notes (Signed)
 ADVANCED HEART FAILURE CLINIC NOTE  Referring Physician: Loura Back, NP  Primary Care: Loura Back, NP Primary Cardiologist: Leodis Sias, MD  HPI: Paula Wong is a 78 y.o. female with CAD, HFrEF, history of VT, type 2 diabetes, history of stroke, hypertension, hyperlipidemia presenting today to establish care.  She was admitted to [facility] for CVA in August 2023.  She had an echocardiogram at that time that demonstrated EF of 20 to 25%.  This was followed by left heart catheterization with 40% left main, 40% ostial LAD and 80% mid LAD disease.  Her cardiomyopathy was felt to be out of proportion to CAD and she was treated medically at that time with placement of implantable loop recorder.  In April 2024 she had a follow-up echo with persistently reduced EF and moderate mitral regurgitation.  In May 2024 she was admitted after an episode of sustained polymorphic VT that was felt to be ischemic.  She was started on IV amiodarone with repeat left heart catheterization demonstrating persistently severe disease now status post PCI to the mid LAD the lesion.  She was readmitted in 01/06/2023 with acute hypoxic respiratory failure due to decompensated heart failure.  Since that time she has been seen in [facility] clinic where medications have been further uptitrated.  Interval hx:  -From a functional standpoint she is NYHA IIB; reports that she can go to the grocery store, carry a gallon milk without any issues. She has no lightheadedness, LE edema, PND, orthopnea or signs of advanced heart failure. Her LVEF today remains severely reduced.   Activity level/exercise tolerance:  NYHA IIB, although, I believe she is overstating her exercise capacity Orthopnea:  Sleeps on 2 pillows Paroxysmal noctural dyspnea:  no Chest pain/pressure:  no Orthostatic lightheadedness:  no Palpitations:  no Lower extremity edema:  no Presyncope/syncope:  no Cough:  no    Current Outpatient Medications   Medication Sig Dispense Refill   acetaminophen (TYLENOL) 325 MG tablet Take 2 tablets (650 mg total) by mouth every 4 (four) hours as needed for headache or mild pain (pain score 1-3).     amiodarone (PACERONE) 200 MG tablet Take 200 mg by mouth daily.     aspirin 81 MG chewable tablet Chew 1 tablet (81 mg total) by mouth daily. 90 tablet 3   atorvastatin (LIPITOR) 80 MG tablet Take 80 mg by mouth at bedtime.     B-D ULTRAFINE III SHORT PEN 31G X 8 MM MISC Inject into the skin daily.     clopidogrel (PLAVIX) 75 MG tablet Take 1 tablet (75 mg total) by mouth daily. 30 tablet 11   FARXIGA 10 MG TABS tablet Take 10 mg by mouth daily.     furosemide (LASIX) 20 MG tablet Take 1 tablet (20 mg total) by mouth daily. May take extra 20 mg as needed for fluid (Patient taking differently: Take 20 mg by mouth daily.) 90 tablet 3   gabapentin (NEURONTIN) 600 MG tablet Take 600 mg by mouth 2 (two) times daily.     magnesium oxide (MAG-OX) 400 MG tablet Take 400 mg by mouth daily.     metFORMIN (GLUCOPHAGE-XR) 500 MG 24 hr tablet Take 500 mg by mouth daily with breakfast.     nitroGLYCERIN (NITROSTAT) 0.4 MG SL tablet Place 1 tablet (0.4 mg total) under the tongue every 5 (five) minutes x 3 doses as needed for chest pain. 25 tablet 2   ONETOUCH ULTRA test strip USE TO CHECK BLOOD SUGARS ONCE DAILY AND  AS NEEDED     OZEMPIC, 1 MG/DOSE, 4 MG/3ML SOPN Inject 1 mg into the skin once a week.     pantoprazole (PROTONIX) 20 MG tablet Take 1 tablet by mouth once daily 90 tablet 0   sacubitril-valsartan (ENTRESTO) 97-103 MG Take 1 tablet by mouth 2 (two) times daily. 60 tablet 5   spironolactone (ALDACTONE) 25 MG tablet Take 1 tablet (25 mg total) by mouth daily. 90 tablet 3   TRESIBA FLEXTOUCH 200 UNIT/ML SOPN Take 80 Units by mouth at bedtime.      carvedilol (COREG) 25 MG tablet Take 1 tablet (25 mg total) by mouth 2 (two) times daily. 60 tablet 11   No current facility-administered medications for this encounter.     PHYSICAL EXAM: Vitals:   11/04/23 1017  BP: 128/80  Pulse: 79  SpO2: 96%   GENERAL: NAD Lungs- CTA CARDIAC:  JVP: 6 cm          Normal rate with regular rhythm. no murmur.  Pulses 2+. no edema.  ABDOMEN: Soft, non-tender, non-distended.  EXTREMITIES: Warm and well perfused.  NEUROLOGIC: No obvious FND   DATA REVIEW  ECG: 01/24/23: sinus bradycardia as per my personal interpretation 05/09/23: NSR  ECHO: 12/06/22: LVEF 25%, normal RV function as per my personal interpretation  CATH: 12/31/22:   Mid Cx lesion is 5% stenosed.   Ost LAD to Prox LAD lesion is 40% stenosed.   Mid LAD-2 lesion is 30% stenosed.   Ost LM lesion is 40% stenosed.   Mid LM to Dist LM lesion is 30% stenosed.   RV Branch lesion is 70% stenosed.   Prox LAD to Mid LAD lesion is 85% stenosed.   Mid LAD-1 lesion is 85% stenosed.   A drug-eluting stent was successfully placed using a SYNERGY XD 3.0X24.   Post intervention, there is a 0% residual stenosis.   Post intervention, there is a 0% residual stenosis.   LV end diastolic pressure is moderately elevated.   2 vessel obstructive CAD with severe LAD stenosis sequentially with heavy calcification. Moderate RCA disease diffusely in a small branch Moderately elevated LVEDP 25 mm Hg Successful PCI of the mid LAD using IVUS guidance, Shockwave intracoronary lithotripsy and DES x 1.  03/27/22:  The left main has 40% ostial 30% distal stenosis; the LAD has 40% ostial to proximal stenosis followed by focal calcified 80% stenosis prior to the takeoff of a septal perforator and the second diagonal vessel with 30% mid stenosis; mild nonobstructive disease in the left circumflex vessel, and diffuse 70% stenosis in the marginal branch of the RCA.     ASSESSMENT & PLAN:  Heart failure with reduced ejection fraction Etiology of HK:[id number] cardiomyopathy; plan for CMR to re-evaluate LVEFwith CMR.  NYHA class / AHA Stage: NYHA II, however likely understating the  degree of her symptoms. Volume status & Diuretics: Euvolemic, continue lasix 20mg  daily; repeat BMP/BNP today.  Vasodilators:Continue Entresto 97/103mg  BID Beta-Blocker: increase coreg to 25mg  BID OVF:IEPPIRJJOACZYS 25mg  Cardiometabolic:Farxiga 10mg  Devices therapies & Valvulopathies: s/p primary prevention ICD Advanced therapies: Not indicated.  Limited by age will continue aggressive medical management.  2. Hypertension  - well controlled today, see above.  - Repeat labs - SBP 128 today; increase coreg to 25mg  BID.   3. CAD -Multivessel CAD with PCI to the LAD as noted above.  Continue Plavix for 1 year -Lipid for 80 mg daily with aspirin 81 mg -No chest pain; doing very well.   4. VT -Polymorphic VT  noted on loop recorder 05/24. QT prolonged on admit.  -Seen by EP.  Suspected d/t CAD.  -Completing amiodarone taper. TSH and LFTs reviewed; WNL on 10/07/23.  -Device interrogation with no events;    5. 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 Seen by Neurology -Loop recorder was placed 08/23.  No AF documented -Has been on aspirin, plavix and statin  6. Obesity - Body mass index is 31.55 kg/m. - Now on ozempic; no reported side effects.  I spent 33 minutes caring for this patient today including face to face time, ordering and reviewing labs, reviewing echocardiogram with her, seeing the patient, documenting in the record, and arranging follow ups.    Betsie Peckman Advanced Heart Failure Mechanical Circulatory Support

## 2023-11-04 ENCOUNTER — Other Ambulatory Visit (HOSPITAL_COMMUNITY): Payer: Self-pay | Admitting: Physician Assistant

## 2023-11-04 ENCOUNTER — Ambulatory Visit (HOSPITAL_COMMUNITY)
Admission: RE | Admit: 2023-11-04 | Discharge: 2023-11-04 | Disposition: A | Discharge status: Home / Self Care | Payer: 59 | Source: Ambulatory Visit | Attending: Cardiology | Admitting: Cardiology

## 2023-11-04 ENCOUNTER — Encounter: Payer: Self-pay | Admitting: *Deleted

## 2023-11-04 ENCOUNTER — Encounter (HOSPITAL_COMMUNITY): Payer: Self-pay | Admitting: Cardiology

## 2023-11-04 VITALS — BP 128/80 | HR 79 | Ht 65.0 in | Wt 189.6 lb

## 2023-11-04 DIAGNOSIS — E785 Hyperlipidemia, unspecified: Secondary | ICD-10-CM | POA: Insufficient documentation

## 2023-11-04 DIAGNOSIS — Z6831 Body mass index (BMI) 31.0-31.9, adult: Secondary | ICD-10-CM | POA: Insufficient documentation

## 2023-11-04 DIAGNOSIS — I509 Heart failure, unspecified: Secondary | ICD-10-CM | POA: Diagnosis present

## 2023-11-04 DIAGNOSIS — E669 Obesity, unspecified: Secondary | ICD-10-CM | POA: Insufficient documentation

## 2023-11-04 DIAGNOSIS — Z7902 Long term (current) use of antithrombotics/antiplatelets: Secondary | ICD-10-CM | POA: Insufficient documentation

## 2023-11-04 DIAGNOSIS — E119 Type 2 diabetes mellitus without complications: Secondary | ICD-10-CM | POA: Insufficient documentation

## 2023-11-04 DIAGNOSIS — Z8673 Personal history of transient ischemic attack (TIA), and cerebral infarction without residual deficits: Secondary | ICD-10-CM | POA: Diagnosis not present

## 2023-11-04 DIAGNOSIS — I639 Cerebral infarction, unspecified: Secondary | ICD-10-CM

## 2023-11-04 DIAGNOSIS — I5022 Chronic systolic (congestive) heart failure: Secondary | ICD-10-CM

## 2023-11-04 DIAGNOSIS — I472 Ventricular tachycardia, unspecified: Secondary | ICD-10-CM | POA: Insufficient documentation

## 2023-11-04 DIAGNOSIS — Z79899 Other long term (current) drug therapy: Secondary | ICD-10-CM | POA: Insufficient documentation

## 2023-11-04 DIAGNOSIS — Z955 Presence of coronary angioplasty implant and graft: Secondary | ICD-10-CM | POA: Insufficient documentation

## 2023-11-04 DIAGNOSIS — I2510 Atherosclerotic heart disease of native coronary artery without angina pectoris: Secondary | ICD-10-CM

## 2023-11-04 DIAGNOSIS — Z7984 Long term (current) use of oral hypoglycemic drugs: Secondary | ICD-10-CM | POA: Diagnosis not present

## 2023-11-04 DIAGNOSIS — I110 Hypertensive heart disease with heart failure: Secondary | ICD-10-CM | POA: Insufficient documentation

## 2023-11-04 DIAGNOSIS — Z87891 Personal history of nicotine dependence: Secondary | ICD-10-CM | POA: Insufficient documentation

## 2023-11-04 DIAGNOSIS — Z006 Encounter for examination for normal comparison and control in clinical research program: Secondary | ICD-10-CM

## 2023-11-04 LAB — ECHOCARDIOGRAM COMPLETE
AR max vel: 1.92 cm2
AV Area VTI: 2.18 cm2
AV Area mean vel: 1.84 cm2
AV Mean grad: 2 mmHg
AV Peak grad: 4.3 mmHg
Ao pk vel: 1.04 m/s
Area-P 1/2: 6.6 cm2
Calc EF: 25.9 %
S' Lateral: 5.1 cm
Single Plane A2C EF: 27.5 %
Single Plane A4C EF: 22.5 %

## 2023-11-04 LAB — BASIC METABOLIC PANEL
Anion gap: 6 (ref 5–15)
BUN: 7 mg/dL — ABNORMAL LOW (ref 8–23)
CO2: 26 mmol/L (ref 22–32)
Calcium: 8.6 mg/dL — ABNORMAL LOW (ref 8.9–10.3)
Chloride: 109 mmol/L (ref 98–111)
Creatinine, Ser: 1.07 mg/dL — ABNORMAL HIGH (ref 0.44–1.00)
GFR, Estimated: 53 mL/min — ABNORMAL LOW (ref >60)
Glucose, Bld: 135 mg/dL — ABNORMAL HIGH (ref 70–99)
Potassium: 3.4 mmol/L — ABNORMAL LOW (ref 3.5–5.1)
Sodium: 141 mmol/L (ref 135–145)

## 2023-11-04 LAB — BRAIN NATRIURETIC PEPTIDE: B Natriuretic Peptide: 133 pg/mL — ABNORMAL HIGH (ref 0.0–100.0)

## 2023-11-04 MED ORDER — CARVEDILOL 25 MG PO TABS: 25.0000 mg | ORAL_TABLET | Freq: Two times a day (BID) | ORAL | 11 refills | Status: DC

## 2023-11-04 NOTE — Research (Signed)
 SITE: [id number]     Subject #[id number]    Subprotocol: A  Inclusion Criteria  Patients who meet all of the following criteria are eligible for enrollment as study participants:  Yes No  Age > 79 years old X   Eligible to wear Holter Study X    Exclusion Criteria  Patients who meet any of these criteria are not eligible for enrollment as study participants: Yes No  1. Receiving any mechanical (respiratory or circulatory) or renal support therapy at Screening or during Visit #1.  X  2.  Any other conditions that in the opinion of the investigators are likely to prevent compliance with the study protocol or pose a safety concern if the subject participates in the study.  X  3. Poor tolerance, namely susceptible to severe skin allergies from ECG adhesive patch application.  X   Protocol: REV H                                     Residential Zip [phone] (First 3 digits ONLY)                                             [facility] Informed Consent   Subject Name: Paula Wong  Subject met inclusion and exclusion criteria.  The informed consent form, study requirements and expectations were reviewed with the subject. Subject had opportunity to read consent and questions and concerns were addressed prior to the signing of the consent form.  The subject verbalized understanding of the trial requirements.  The subject agreed to participate in the EF-ACT trial and signed the informed consent at 1040 on 11/04/23.  The informed consent was obtained prior to performance of any protocol-specific procedures for the subject.  A copy of the signed informed consent was given to the subject and a copy was placed in the subject's medical record.   Brunilda Payor          Current Outpatient Medications:    acetaminophen (TYLENOL) 325 MG tablet, Take 2 tablets (650 mg total) by mouth every 4 (four) hours as needed for headache or mild pain (pain score 1-3)., Disp: , Rfl:    amiodarone (PACERONE) 200 MG tablet, Take  200 mg by mouth daily., Disp: , Rfl:    aspirin 81 MG chewable tablet, Chew 1 tablet (81 mg total) by mouth daily., Disp: 90 tablet, Rfl: 3   atorvastatin (LIPITOR) 80 MG tablet, Take 80 mg by mouth at bedtime., Disp: , Rfl:    B-D ULTRAFINE III SHORT PEN 31G X 8 MM MISC, Inject into the skin daily., Disp: , Rfl:    carvedilol (COREG) 12.5 MG tablet, Take 1 tablet (12.5 mg total) by mouth 2 (two) times daily., Disp: 60 tablet, Rfl: 11   clopidogrel (PLAVIX) 75 MG tablet, Take 1 tablet (75 mg total) by mouth daily., Disp: 30 tablet, Rfl: 11   FARXIGA 10 MG TABS tablet, Take 10 mg by mouth daily., Disp: , Rfl:    furosemide (LASIX) 20 MG tablet, Take 1 tablet (20 mg total) by mouth daily. May take extra 20 mg as needed for fluid (Patient taking differently: Take 20 mg by mouth daily.), Disp: 90 tablet, Rfl: 3   gabapentin (NEURONTIN) 600 MG tablet, Take 600 mg by mouth 2 (two) times daily.,  Disp: , Rfl:    magnesium oxide (MAG-OX) 400 MG tablet, Take 400 mg by mouth daily., Disp: , Rfl:    metFORMIN (GLUCOPHAGE-XR) 500 MG 24 hr tablet, Take 500 mg by mouth daily with breakfast., Disp: , Rfl:    nitroGLYCERIN (NITROSTAT) 0.4 MG SL tablet, Place 1 tablet (0.4 mg total) under the tongue every 5 (five) minutes x 3 doses as needed for chest pain., Disp: 25 tablet, Rfl: 2   ONETOUCH ULTRA test strip, USE TO CHECK BLOOD SUGARS ONCE DAILY AND AS NEEDED, Disp: , Rfl:    OZEMPIC, 1 MG/DOSE, 4 MG/3ML SOPN, Inject 1 mg into the skin once a week., Disp: , Rfl:    pantoprazole (PROTONIX) 20 MG tablet, Take 1 tablet by mouth once daily, Disp: 90 tablet, Rfl: 0   sacubitril-valsartan (ENTRESTO) 97-103 MG, Take 1 tablet by mouth 2 (two) times daily., Disp: 60 tablet, Rfl: 5   spironolactone (ALDACTONE) 25 MG tablet, Take 1 tablet (25 mg total) by mouth daily., Disp: 90 tablet, Rfl: 3   TRESIBA FLEXTOUCH 200 UNIT/ML SOPN, Take 80 Units by mouth at bedtime. , Disp: , Rfl:

## 2023-11-04 NOTE — Patient Instructions (Signed)
 Medication Changes:  INCREASE CARVEDILOL TO 25MG  TWICE DAILY   Lab Work:  Labs done today, your results will be available in MyChart, we will contact you for abnormal readings.  Referrals:  YOU HAVE BEEN REFERRED TO CARDIAC REHAB THEY WILL REACH OUT TO YOU OR CALL TO ARRANGE THIS. PLEASE CALL us WITH ANY CONCERNS   Follow-Up in: 3 MONTHS AS SCHEDULED   At the [facility], you and your health needs are our priority. We have a designated team specialized in the treatment of Heart Failure. This Care Team includes your primary Heart Failure Specialized Cardiologist (physician), Advanced Practice Providers (APPs- Physician Assistants and Nurse Practitioners), and Pharmacist who all work together to provide you with the care you need, when you need it.   You may see any of the following providers on your designated Care Team at your next follow up:  Dr. Arvilla Meres Dr. Marca Ancona Dr. Dorthula Nettles Dr. Theresia Bough Tonye Becket, NP Robbie Lis, [place] [facility] [place], [place] Brynda Peon, NP [place] Lee, NP Karle Plumber, PharmD   Please be sure to bring in all your medications bottles to every appointment.   Need to Contact us:  If you have any questions or concerns before your next appointment please send [place] a message through [place] or call our office at [phone].    TO LEAVE A MESSAGE FOR THE NURSE SELECT OPTION 2, PLEASE LEAVE A MESSAGE INCLUDING: YOUR NAME DATE OF BIRTH CALL BACK NUMBER REASON FOR CALL**this is important as we prioritize the call backs  YOU WILL RECEIVE A CALL BACK THE SAME DAY AS LONG AS YOU CALL BEFORE 4:00 PM

## 2023-11-07 ENCOUNTER — Encounter (HOSPITAL_COMMUNITY): Payer: Self-pay

## 2023-11-07 ENCOUNTER — Telehealth (HOSPITAL_COMMUNITY): Payer: Self-pay

## 2023-11-07 NOTE — Telephone Encounter (Signed)
 Pt returned CR phone call and stated she is interested, will pass referral to Rn for review.

## 2023-11-07 NOTE — Telephone Encounter (Signed)
 Pt insurance is active and benefits verified through [facility] [employer]. Co-pay $0, DED $257/$257 met, out of pocket $9,350/$527.23 met, co-insurance 20%. No pre-authorization required. Passport, 11/07/23 @ 12:14p, REF# [id number]  How many CR sessions are covered? (36 visits for TCR, 72 visits for ICR)72 Is this a lifetime maximum or an annual maximum? Annual Has the member used any of these services to date? No Is there a time limit (weeks/months) on start of program and/or program completion? No   Will contact patient to see if she is interested in the Cardiac Rehab Program. If interested, patient will need to complete follow up appt. Once completed, patient will be contacted for scheduling upon review by the RN Navigator.

## 2023-11-07 NOTE — Telephone Encounter (Signed)
 Attempted to call patient in regards to Cardiac Rehab - LM on VM

## 2023-11-10 NOTE — Addendum Note (Signed)
 Addended by: Geralyn Flash D on: 11/10/2023 02:42 PM   Modules accepted: Orders

## 2023-11-10 NOTE — Progress Notes (Signed)
 Remote ICD transmission.

## 2023-11-12 ENCOUNTER — Telehealth (HOSPITAL_COMMUNITY): Payer: Self-pay

## 2023-11-12 NOTE — Telephone Encounter (Signed)
 Called patient to see if she was interested in participating in the Cardiac Rehab Program. Patient will come in for orientation on 11/20/23 @ 830AM and will attend the 645AM exercise class.   Pensions consultant.

## 2023-11-19 ENCOUNTER — Telehealth (HOSPITAL_COMMUNITY): Payer: Self-pay

## 2023-11-19 NOTE — Telephone Encounter (Signed)
 Attempted to call patient in regards to Cardiac Rehab, left VM to adv pt to please come to appt closer to 8:30 am due to department meeting.

## 2023-11-20 ENCOUNTER — Encounter (HOSPITAL_COMMUNITY)
Admission: RE | Admit: 2023-11-20 | Discharge: 2023-11-20 | Disposition: A | Discharge status: Home / Self Care | Source: Ambulatory Visit | Attending: Cardiology | Admitting: Cardiology

## 2023-11-20 VITALS — BP 108/62 | HR 81 | Ht 65.0 in | Wt 192.2 lb

## 2023-11-20 DIAGNOSIS — I5042 Chronic combined systolic (congestive) and diastolic (congestive) heart failure: Secondary | ICD-10-CM | POA: Diagnosis present

## 2023-11-20 NOTE — Progress Notes (Signed)
 Cardiac Rehab Medication Review   Does the patient  feel that his/her medications are working for him/her?  yes  Has the patient been experiencing any side effects to the medications prescribed?  no  Does the patient measure his/her own blood pressure or blood glucose at home?  yes   Does the patient have any problems obtaining medications due to transportation or finances?   no  Understanding of regimen: good Understanding of indications: good Potential of compliance: good    Comments: Patient checks her blood sugar and BP at home daily. She has no issues with compliance to her medications and feels good with her medication routine.     Paula Wong 11/20/2023 10:12 AM

## 2023-11-20 NOTE — Progress Notes (Addendum)
 Cardiac Individual Treatment Plan  Patient Details  Name: Paula Wong MRN: [id number] Date of Birth: 30-Dec-1945 Referring Provider:   Flowsheet Row INTENSIVE CARDIAC REHAB ORIENT from 11/20/2023 in [facility]  Referring Provider Dr. Dorthula Nettles DO       Initial Encounter Date:  Flowsheet Row INTENSIVE CARDIAC REHAB ORIENT from 11/20/2023 in [facility]  Date 11/20/23       Visit Diagnosis: Heart failure, systolic and diastolic, chronic (HCC)  Patient's Home Medications on Admission:  Current Outpatient Medications:    acetaminophen (TYLENOL) 325 MG tablet, Take 2 tablets (650 mg total) by mouth every 4 (four) hours as needed for headache or mild pain (pain score 1-3)., Disp: , Rfl:    amiodarone (PACERONE) 200 MG tablet, Take 200 mg by mouth daily., Disp: , Rfl:    aspirin 81 MG chewable tablet, Chew 1 tablet (81 mg total) by mouth daily., Disp: 90 tablet, Rfl: 3   atorvastatin (LIPITOR) 80 MG tablet, Take 80 mg by mouth at bedtime., Disp: , Rfl:    B-D ULTRAFINE III SHORT PEN 31G X 8 MM MISC, Inject into the skin daily., Disp: , Rfl:    carvedilol (COREG) 25 MG tablet, Take 1 tablet (25 mg total) by mouth 2 (two) times daily. (Patient taking differently: Take 12.5 mg by mouth 2 (two) times daily. 11/20/23 Dose decreased to 12.5 mg twice a day by Carlos Levering NP. MWW RN), Disp: 60 tablet, Rfl: 11   clopidogrel (PLAVIX) 75 MG tablet, Take 1 tablet (75 mg total) by mouth daily., Disp: 30 tablet, Rfl: 11   FARXIGA 10 MG TABS tablet, Take 10 mg by mouth daily., Disp: , Rfl:    furosemide (LASIX) 20 MG tablet, Take 1 tablet (20 mg total) by mouth daily. May take an additional tablet as needed for swelling/SOB, Disp: 100 tablet, Rfl: 3   gabapentin (NEURONTIN) 600 MG tablet, Take 600 mg by mouth 2 (two) times daily., Disp: , Rfl:    magnesium oxide (MAG-OX) 400  MG tablet, Take 400 mg by mouth daily., Disp: , Rfl:    metFORMIN (GLUCOPHAGE-XR) 500 MG 24 hr tablet, Take 500 mg by mouth daily with breakfast., Disp: , Rfl:    nitroGLYCERIN (NITROSTAT) 0.4 MG SL tablet, Place 1 tablet (0.4 mg total) under the tongue every 5 (five) minutes x 3 doses as needed for chest pain., Disp: 25 tablet, Rfl: 2   ONETOUCH ULTRA test strip, USE TO CHECK BLOOD SUGARS ONCE DAILY AND AS NEEDED, Disp: , Rfl:    OZEMPIC, 1 MG/DOSE, 4 MG/3ML SOPN, Inject 1 mg into the skin once a week., Disp: , Rfl:    pantoprazole (PROTONIX) 20 MG tablet, Take 1 tablet by mouth once daily, Disp: 90 tablet, Rfl: 0   sacubitril-valsartan (ENTRESTO) 97-103 MG, Take 1 tablet by mouth 2 (two) times daily., Disp: 60 tablet, Rfl: 5   spironolactone (ALDACTONE) 25 MG tablet, Take 1 tablet (25 mg total) by mouth daily., Disp: 90 tablet, Rfl: 3   TRESIBA FLEXTOUCH 200 UNIT/ML SOPN, Take 80 Units by mouth at bedtime. , Disp: , Rfl:   Past Medical History: Past Medical History:  Diagnosis Date   Allergy    CAD (coronary artery disease)    a. 03/2022 Cath: LM 40ost, 50m, LAD 40ost/p, 75m, 12m, LCX 30m, RCA nl, RV branch 70-->Med rx.   Chronic HFrEF (heart failure with reduced ejection  fraction) (HCC)    a. 11/2021 Echo: EF 20-25%; b. 03/2022 Echo: EF 20-25%; c. 11/2022 Echo: EF 25-30%, glob HK.   Diabetes (HCC)    Diverticulosis    GERD (gastroesophageal reflux disease)    Hiatal hernia    Hyperlipidemia    Hypertension    Implantable loop recorder present    a. 03/2022 s/p Abbott IQ EL+ (serial [id number]) ILR in setting of cryptogenic stroke.   Ischemic cardiomyopathy    a. 11/2021 Echo: EF 20-25%; b. 03/2022 Echo: EF 20-25%; c. 11/2022 Echo: EF 25-30%, glob HK, nl RV fxn, mild-mod MR, mild AI, AoV sclerosis.   Neuropathy    feet   Schatzki's ring    Stroke [facility])    a. 03/2022 MRI Brain: subacute cortical infarct involving inf and med L occipial pole w/ petechial hemorrhage; b. 03/2022 s/p Abbott Assert  IQ EL ILR.   Ventricular tachycardia (HCC)    a. 12/2022 noted on ILR.    Tobacco Use: Social History   Tobacco Use  Smoking Status Former   Current packs/day: 0.00   Types: Cigarettes   Quit date: 08/20/2003   Years since quitting: 20.2  Smokeless Tobacco Never    Labs: Review Flowsheet  More data exists      Latest Ref Rng & Units 10/02/2020 04/13/2022 12/31/2022 03/06/2023 05/09/2023  Labs for ITP Cardiac and Pulmonary Rehab  Cholestrol 0 - 200 mg/dL 409  811  914  - 782   LDL (calc) 0 - 99 mg/dL 90  70  41  - 60   HDL-C >40 mg/dL 41  35  39  - 43   Trlycerides <150 mg/dL 956  82  213  - 086   Hemoglobin A1c 4.8 - 5.6 % - 6.3  - 6.2  -    Capillary Blood Glucose: Lab Results  Component Value Date   GLUCAP 181 (H) 07/01/2023   GLUCAP 135 (H) 07/01/2023   GLUCAP 145 (H) 06/30/2023   GLUCAP 83 06/30/2023   GLUCAP 83 06/30/2023     Exercise Target Goals: Exercise Program Goal: Individual exercise prescription set using results from initial 6 min walk test and THRR while considering  patient's activity barriers and safety.   Exercise Prescription Goal: Initial exercise prescription builds to 30-45 minutes a day of aerobic activity, 2-3 days per week.  Home exercise guidelines will be given to patient during program as part of exercise prescription that the participant will acknowledge.  Activity Barriers & Risk Stratification:  Activity Barriers & Cardiac Risk Stratification - 11/20/23 0832       Activity Barriers & Cardiac Risk Stratification   Activity Barriers Deconditioning;Assistive Device;Decreased Ventricular Function;Balance Concerns;History of Falls    Cardiac Risk Stratification High             6 Minute Walk:  6 Minute Walk     Row Name 11/20/23 1356         6 Minute Walk   Phase Initial     Distance 750 feet     Walk Time 6 minutes     # of Rest Breaks 1  seated rest break for fatigue. from 3.00-1.19     MPH 1.42     METS 1.02     RPE 11      Perceived Dyspnea  0     VO2 Peak 3.56     Symptoms Yes (comment)     Comments Asymptomatic: low BP post walk at 82/57, H2o recheck seated 94/60 and  standing: 104/62     Resting HR 81 bpm     Resting BP 108/62     Resting Oxygen Saturation  94 %     Exercise Oxygen Saturation  during 6 min walk 98 %     Max Ex. HR 106 bpm     Max Ex. BP 110/64     2 Minute Post BP 82/57  recheck 94/60 both asymptomatic              Oxygen Initial Assessment:   Oxygen Re-Evaluation:   Oxygen Discharge (Final Oxygen Re-Evaluation):   Initial Exercise Prescription:  Initial Exercise Prescription - 11/20/23 1400       Date of Initial Exercise RX and Referring Provider   Date 11/20/23    Referring Provider Dr. Dorthula Nettles DO    Expected Discharge Date 02/11/24      NuStep   Level 1    SPM 60    Minutes 15    METs 1.2      Prescription Details   Frequency (times per week) 3    Duration Progress to 30 minutes of continuous aerobic without signs/symptoms of physical distress      Intensity   THRR 40-80% of Max Heartrate 57-114    Ratings of Perceived Exertion 11-13    Perceived Dyspnea 0-4      Progression   Progression Continue progressive overload as per policy without signs/symptoms or physical distress.      Resistance Training   Training Prescription Yes    Weight 2    Reps 10-15             Perform Capillary Blood Glucose checks as needed.  Exercise Prescription Changes:   Exercise Comments:   Exercise Goals and Review:   Exercise Goals     Row Name 11/20/23 [phone]             Exercise Goals   Increase Physical Activity Yes       Intervention Provide advice, education, support and counseling about physical activity/exercise needs.;Develop an individualized exercise prescription for aerobic and resistive training based on initial evaluation findings, risk stratification, comorbidities and participant's personal goals.       Expected Outcomes  Short Term: Attend rehab on a regular basis to increase amount of physical activity.;Long Term: Exercising regularly at least 3-5 days a week.;Long Term: Add in home exercise to make exercise part of routine and to increase amount of physical activity.       Increase Strength and Stamina Yes       Intervention Develop an individualized exercise prescription for aerobic and resistive training based on initial evaluation findings, risk stratification, comorbidities and participant's personal goals.;Provide advice, education, support and counseling about physical activity/exercise needs.       Expected Outcomes Short Term: Increase workloads from initial exercise prescription for resistance, speed, and METs.;Short Term: Perform resistance training exercises routinely during rehab and add in resistance training at home;Long Term: Improve cardiorespiratory fitness, muscular endurance and strength as measured by increased METs and functional capacity ( )       Able to understand and use rate of perceived exertion (RPE) scale Yes       Intervention Provide education and explanation on how to use RPE scale       Expected Outcomes Short Term: Able to use RPE daily in rehab to express subjective intensity level;Long Term:  Able to use RPE to guide intensity level when exercising independently  Knowledge and understanding of Target Heart Rate Range (THRR) Yes       Intervention Provide education and explanation of THRR including how the numbers were predicted and where they are located for reference       Expected Outcomes Short Term: Able to state/look up THRR;Long Term: Able to use THRR to govern intensity when exercising independently;Short Term: Able to use daily as guideline for intensity in rehab       Understanding of Exercise Prescription Yes       Intervention Provide education, explanation, and written materials on patient's individual exercise prescription       Expected Outcomes Short Term: Able  to explain program exercise prescription;Long Term: Able to explain home exercise prescription to exercise independently                Exercise Goals Re-Evaluation :   Discharge Exercise Prescription (Final Exercise Prescription Changes):   Nutrition:  Target Goals: Understanding of nutrition guidelines, daily intake of sodium 1500mg , cholesterol 200mg , calories 30% from fat and 7% or less from saturated fats, daily to have 5 or more servings of fruits and vegetables.  Biometrics:  Pre Biometrics - 11/20/23 0959       Pre Biometrics   Waist Circumference 41.5 inches    Hip Circumference 43 inches    Waist to Hip Ratio 0.97 %    Triceps Skinfold 38 mm    % Body Fat 46.7 %    Grip Strength 25 kg    Flexibility --   unable to reach   Single Leg Stand 4 seconds              Nutrition Therapy Plan and Nutrition Goals:   Nutrition Assessments:  MEDIFICTS Score Key: >=70 Need to make dietary changes  40-70 Heart Healthy Diet <= 40 Therapeutic Level Cholesterol Diet    Picture Your Plate Scores: <16 Unhealthy dietary pattern with much room for improvement. 41-50 Dietary pattern unlikely to meet recommendations for good health and room for improvement. 51-60 More healthful dietary pattern, with some room for improvement.  >60 Healthy dietary pattern, although there may be some specific behaviors that could be improved.    Nutrition Goals Re-Evaluation:   Nutrition Goals Re-Evaluation:   Nutrition Goals Discharge (Final Nutrition Goals Re-Evaluation):   Psychosocial: Target Goals: Acknowledge presence or absence of significant depression and/or stress, maximize coping skills, provide positive support system. Participant is able to verbalize types and ability to use techniques and skills needed for reducing stress and depression.  Initial Review & Psychosocial Screening:  Initial Psych Review & Screening - 11/20/23 0835       Initial Review   Current  issues with None Identified      Family Dynamics   Good Support System? Yes   Her church family is her support     Barriers   Psychosocial barriers to participate in program The patient should benefit from training in stress management and relaxation.      Screening Interventions   Interventions Encouraged to exercise;To provide support and resources with identified psychosocial needs;Provide feedback about the scores to participant    Expected Outcomes Long Term Goal: Stressors or current issues are controlled or eliminated.;Short Term goal: Identification and review with participant of any Quality of Life or Depression concerns found by scoring the questionnaire.;Long Term goal: The participant improves quality of Life and PHQ9 Scores as seen by post scores and/or verbalization of changes  Quality of Life Scores:  Quality of Life - 11/20/23 1416       Quality of Life   Select Quality of Life      Quality of Life Scores   Health/Function Pre 28.36 %    Socioeconomic Pre 27.6 %    Psych/Spiritual Pre 30 %    Family Pre 24 %    GLOBAL Pre 28 %            Scores of 19 and below usually indicate a poorer quality of life in these areas.  A difference of  2-3 points is a clinically meaningful difference.  A difference of 2-3 points in the total score of the Quality of Life Index has been associated with significant improvement in overall quality of life, self-image, physical symptoms, and general health in studies assessing change in quality of life.  PHQ-9: Review Flowsheet       11/20/2023  Depression screen PHQ 2/9  Decreased Interest 0  Down, Depressed, Hopeless 0  PHQ - 2 Score 0  Altered sleeping 0  Tired, decreased energy 0  Change in appetite 0  Feeling bad or failure about yourself  0  Trouble concentrating 0  Moving slowly or fidgety/restless 0  Suicidal thoughts 0  PHQ-9 Score 0  Difficult doing work/chores --   Interpretation of Total Score   Total Score Depression Severity:  1-4 = Minimal depression, 5-9 = Mild depression, 10-14 = Moderate depression, 15-19 = Moderately severe depression, 20-27 = Severe depression   Psychosocial Evaluation and Intervention:   Psychosocial Re-Evaluation:   Psychosocial Discharge (Final Psychosocial Re-Evaluation):   Vocational Rehabilitation: Provide vocational rehab assistance to qualifying candidates.   Vocational Rehab Evaluation & Intervention:  Vocational Rehab - 11/20/23 1420       Initial Vocational Rehab Evaluation & Intervention   Assessment shows need for Vocational Rehabilitation No   no needs, Pt is retired            Education: Education Goals: Education classes will be provided on a weekly basis, covering required topics. Participant will state understanding/return demonstration of topics presented.     Core Videos: Exercise    Move It!  Clinical staff conducted group or individual video education with verbal and written material and guidebook.  Patient learns the recommended Pritikin exercise program. Exercise with the goal of living a long, healthy life. Some of the health benefits of exercise include controlled diabetes, healthier blood pressure levels, improved cholesterol levels, improved heart and lung capacity, improved sleep, and better body composition. Everyone should speak with their doctor before starting or changing an exercise routine.  Biomechanical Limitations Clinical staff conducted group or individual video education with verbal and written material and guidebook.  Patient learns how biomechanical limitations can impact exercise and how we can mitigate and possibly overcome limitations to have an impactful and balanced exercise routine.  Body Composition Clinical staff conducted group or individual video education with verbal and written material and guidebook.  Patient learns that body composition (ratio of muscle mass to fat mass) is a key  component to assessing overall fitness, rather than body weight alone. Increased fat mass, especially visceral belly fat, can put [place] at increased risk for metabolic syndrome, type 2 diabetes, heart disease, and even death. It is recommended to combine diet and exercise (cardiovascular and resistance training) to improve your body composition. Seek guidance from your physician and exercise physiologist before implementing an exercise routine.  Exercise Action Plan Clinical staff conducted group  or individual video education with verbal and written material and guidebook.  Patient learns the recommended strategies to achieve and enjoy long-term exercise adherence, including variety, self-motivation, self-efficacy, and positive decision making. Benefits of exercise include fitness, good health, weight management, more energy, better sleep, less stress, and overall well-being.  Medical   Heart Disease Risk Reduction Clinical staff conducted group or individual video education with verbal and written material and guidebook.  Patient learns our heart is our most vital organ as it circulates oxygen, nutrients, white blood cells, and hormones throughout the entire body, and carries waste away. Data supports a plant-based eating plan like the Pritikin Program for its effectiveness in slowing progression of and reversing heart disease. The video provides a number of recommendations to address heart disease.   Metabolic Syndrome and Belly Fat  Clinical staff conducted group or individual video education with verbal and written material and guidebook.  Patient learns what metabolic syndrome is, how it leads to heart disease, and how one can reverse it and keep it from coming back. You have metabolic syndrome if you have 3 of the following 5 criteria: abdominal obesity, high blood pressure, high triglycerides, low HDL cholesterol, and high blood sugar.  Hypertension and Heart Disease Clinical staff conducted  group or individual video education with verbal and written material and guidebook.  Patient learns that high blood pressure, or hypertension, is very common in the [place]. Hypertension is largely due to excessive salt intake, but other important risk factors include being overweight, physical inactivity, drinking too much alcohol, smoking, and not eating enough potassium from fruits and vegetables. High blood pressure is a leading risk factor for heart attack, stroke, congestive heart failure, dementia, kidney failure, and premature death. Long-term effects of excessive salt intake include stiffening of the arteries and thickening of heart muscle and organ damage. Recommendations include ways to reduce hypertension and the risk of heart disease.  Diseases of Our Time - Focusing on Diabetes Clinical staff conducted group or individual video education with verbal and written material and guidebook.  Patient learns why the best way to stop diseases of our time is prevention, through food and other lifestyle changes. Medicine (such as prescription pills and surgeries) is often only a Band-Aid on the problem, not a long-term solution. Most common diseases of our time include obesity, type 2 diabetes, hypertension, heart disease, and cancer. The Pritikin Program is recommended and has been proven to help reduce, reverse, and/or prevent the damaging effects of metabolic syndrome.  Nutrition   Overview of the Pritikin Eating Plan  Clinical staff conducted group or individual video education with verbal and written material and guidebook.  Patient learns about the Pritikin Eating Plan for disease risk reduction. The Pritikin Eating Plan emphasizes a wide variety of unrefined, minimally-processed carbohydrates, like fruits, vegetables, whole grains, and legumes. Go, Caution, and Stop food choices are explained. Plant-based and lean animal proteins are emphasized. Rationale provided for low sodium intake for  blood pressure control, low added sugars for blood sugar stabilization, and low added fats and oils for coronary artery disease risk reduction and weight management.  Calorie Density  Clinical staff conducted group or individual video education with verbal and written material and guidebook.  Patient learns about calorie density and how it impacts the Pritikin Eating Plan. Knowing the characteristics of the food you choose will help you decide whether those foods will lead to weight gain or weight loss, and whether you want to consume more or less  of them. Weight loss is usually a side effect of the Pritikin Eating Plan because of its focus on low calorie-dense foods.  Label Reading  Clinical staff conducted group or individual video education with verbal and written material and guidebook.  Patient learns about the Pritikin recommended label reading guidelines and corresponding recommendations regarding calorie density, added sugars, sodium content, and whole grains.  Dining Out - Part 1  Clinical staff conducted group or individual video education with verbal and written material and guidebook.  Patient learns that restaurant meals can be sabotaging because they can be so high in calories, fat, sodium, and/or sugar. Patient learns recommended strategies on how to positively address this and avoid unhealthy pitfalls.  Facts on Fats  Clinical staff conducted group or individual video education with verbal and written material and guidebook.  Patient learns that lifestyle modifications can be just as effective, if not more so, as many medications for lowering your risk of heart disease. A Pritikin lifestyle can help to reduce your risk of inflammation and atherosclerosis (cholesterol build-up, or plaque, in the artery walls). Lifestyle interventions such as dietary choices and physical activity address the cause of atherosclerosis. A review of the types of fats and their impact on blood cholesterol  levels, along with dietary recommendations to reduce fat intake is also included.  Nutrition Action Plan  Clinical staff conducted group or individual video education with verbal and written material and guidebook.  Patient learns how to incorporate Pritikin recommendations into their lifestyle. Recommendations include planning and keeping personal health goals in mind as an important part of their success.  Healthy Mind-Set    Healthy Minds, Bodies, Hearts  Clinical staff conducted group or individual video education with verbal and written material and guidebook.  Patient learns how to identify when they are stressed. Video will discuss the impact of that stress, as well as the many benefits of stress management. Patient will also be introduced to stress management techniques. The way we think, act, and feel has an impact on our hearts.  How Our Thoughts Can Heal Our Hearts  Clinical staff conducted group or individual video education with verbal and written material and guidebook.  Patient learns that negative thoughts can cause depression and anxiety. This can result in negative lifestyle behavior and serious health problems. Cognitive behavioral therapy is an effective method to help control our thoughts in order to change and improve our emotional outlook.  Additional Videos:  Exercise    Improving Performance  Clinical staff conducted group or individual video education with verbal and written material and guidebook.  Patient learns to use a non-linear approach by alternating intensity levels and lengths of time spent exercising to help burn more calories and lose more body fat. Cardiovascular exercise helps improve heart health, metabolism, hormonal balance, blood sugar control, and recovery from fatigue. Resistance training improves strength, endurance, balance, coordination, reaction time, metabolism, and muscle mass. Flexibility exercise improves circulation, posture, and balance. Seek  guidance from your physician and exercise physiologist before implementing an exercise routine and learn your capabilities and proper form for all exercise.  Introduction to Yoga  Clinical staff conducted group or individual video education with verbal and written material and guidebook.  Patient learns about yoga, a discipline of the coming together of mind, breath, and body. The benefits of yoga include improved flexibility, improved range of motion, better posture and core strength, increased lung function, weight loss, and positive self-image. Yoga's heart health benefits include lowered blood pressure, healthier  heart rate, decreased cholesterol and triglyceride levels, improved immune function, and reduced stress. Seek guidance from your physician and exercise physiologist before implementing an exercise routine and learn your capabilities and proper form for all exercise.  Medical   Aging: Enhancing Your Quality of Life  Clinical staff conducted group or individual video education with verbal and written material and guidebook.  Patient learns key strategies and recommendations to stay in good physical health and enhance quality of life, such as prevention strategies, having an advocate, securing a Health Care Proxy and Power of Attorney, and keeping a list of medications and system for tracking them. It also discusses how to avoid risk for bone loss.  Biology of Weight Control  Clinical staff conducted group or individual video education with verbal and written material and guidebook.  Patient learns that weight gain occurs because we consume more calories than we burn (eating more, moving less). Even if your body weight is normal, you may have higher ratios of fat compared to muscle mass. Too much body fat puts you at increased risk for cardiovascular disease, heart attack, stroke, type 2 diabetes, and obesity-related cancers. In addition to exercise, following the Pritikin Eating Plan can help  reduce your risk.  Decoding Lab Results  Clinical staff conducted group or individual video education with verbal and written material and guidebook.  Patient learns that lab test reflects one measurement whose values change over time and are influenced by many factors, including medication, stress, sleep, exercise, food, hydration, pre-existing medical conditions, and more. It is recommended to use the knowledge from this video to become more involved with your lab results and evaluate your numbers to speak with your doctor.   Diseases of Our Time - Overview  Clinical staff conducted group or individual video education with verbal and written material and guidebook.  Patient learns that according to the CDC, 50% to 70% of chronic diseases (such as obesity, type 2 diabetes, elevated lipids, hypertension, and heart disease) are avoidable through lifestyle improvements including healthier food choices, listening to satiety cues, and increased physical activity.  Sleep Disorders Clinical staff conducted group or individual video education with verbal and written material and guidebook.  Patient learns how good quality and duration of sleep are important to overall health and well-being. Patient also learns about sleep disorders and how they impact health along with recommendations to address them, including discussing with a physician.  Nutrition  Dining Out - Part 2 Clinical staff conducted group or individual video education with verbal and written material and guidebook.  Patient learns how to plan ahead and communicate in order to maximize their dining experience in a healthy and nutritious manner. Included are recommended food choices based on the type of restaurant the patient is visiting.   Fueling a Banker conducted group or individual video education with verbal and written material and guidebook.  There is a strong connection between our food choices and our health.  Diseases like obesity and type 2 diabetes are very prevalent and are in large-part due to lifestyle choices. The Pritikin Eating Plan provides plenty of food and hunger-curbing satisfaction. It is easy to follow, affordable, and helps reduce health risks.  Menu Workshop  Clinical staff conducted group or individual video education with verbal and written material and guidebook.  Patient learns that restaurant meals can sabotage health goals because they are often packed with calories, fat, sodium, and sugar. Recommendations include strategies to plan ahead and to communicate with  the manager, chef, or server to help order a healthier meal.  Planning Your Eating Strategy  Clinical staff conducted group or individual video education with verbal and written material and guidebook.  Patient learns about the Pritikin Eating Plan and its benefit of reducing the risk of disease. The Pritikin Eating Plan does not focus on calories. Instead, it emphasizes high-quality, nutrient-rich foods. By knowing the characteristics of the foods, we choose, we can determine their calorie density and make informed decisions.  Targeting Your Nutrition Priorities  Clinical staff conducted group or individual video education with verbal and written material and guidebook.  Patient learns that lifestyle habits have a tremendous impact on disease risk and progression. This video provides eating and physical activity recommendations based on your personal health goals, such as reducing LDL cholesterol, losing weight, preventing or controlling type 2 diabetes, and reducing high blood pressure.  Vitamins and Minerals  Clinical staff conducted group or individual video education with verbal and written material and guidebook.  Patient learns different ways to obtain key vitamins and minerals, including through a recommended healthy diet. It is important to discuss all supplements you take with your doctor.   Healthy Mind-Set     Smoking Cessation  Clinical staff conducted group or individual video education with verbal and written material and guidebook.  Patient learns that cigarette smoking and tobacco addiction pose a serious health risk which affects millions of people. Stopping smoking will significantly reduce the risk of heart disease, lung disease, and many forms of cancer. Recommended strategies for quitting are covered, including working with your doctor to develop a successful plan.  Culinary   Becoming a Set designer conducted group or individual video education with verbal and written material and guidebook.  Patient learns that cooking at home can be healthy, cost-effective, quick, and puts them in control. Keys to cooking healthy recipes will include looking at your recipe, assessing your equipment needs, planning ahead, making it simple, choosing cost-effective seasonal ingredients, and limiting the use of added fats, salts, and sugars.  Cooking - Breakfast and Snacks  Clinical staff conducted group or individual video education with verbal and written material and guidebook.  Patient learns how important breakfast is to satiety and nutrition through the entire day. Recommendations include key foods to eat during breakfast to help stabilize blood sugar levels and to prevent overeating at meals later in the day. Planning ahead is also a key component.  Cooking - Educational psychologist conducted group or individual video education with verbal and written material and guidebook.  Patient learns eating strategies to improve overall health, including an approach to cook more at home. Recommendations include thinking of animal protein as a side on your plate rather than center stage and focusing instead on lower calorie dense options like vegetables, fruits, whole grains, and plant-based proteins, such as beans. Making sauces in large quantities to freeze for later and leaving the skin  on your vegetables are also recommended to maximize your experience.  Cooking - Healthy Salads and Dressing Clinical staff conducted group or individual video education with verbal and written material and guidebook.  Patient learns that vegetables, fruits, whole grains, and legumes are the foundations of the Pritikin Eating Plan. Recommendations include how to incorporate each of these in flavorful and healthy salads, and how to create homemade salad dressings. Proper handling of ingredients is also covered. Cooking - Soups and Desserts  Cooking - Soups and Surveyor, quantity conducted  group or individual video education with verbal and written material and guidebook.  Patient learns that Pritikin soups and desserts make for easy, nutritious, and delicious snacks and meal components that are low in sodium, fat, sugar, and calorie density, while high in vitamins, minerals, and filling fiber. Recommendations include simple and healthy ideas for soups and desserts.   Overview     The Pritikin Solution Program Overview Clinical staff conducted group or individual video education with verbal and written material and guidebook.  Patient learns that the results of the Pritikin Program have been documented in more than 100 articles published in peer-reviewed journals, and the benefits include reducing risk factors for (and, in some cases, even reversing) high cholesterol, high blood pressure, type 2 diabetes, obesity, and more! An overview of the three key pillars of the Pritikin Program will be covered: eating well, doing regular exercise, and having a healthy mind-set.  WORKSHOPS  Exercise: Exercise Basics: Building Your Action Plan Clinical staff led group instruction and group discussion with PowerPoint presentation and patient guidebook. To enhance the learning environment the use of posters, models and videos may be added. At the conclusion of this workshop, patients will comprehend the  difference between physical activity and exercise, as well as the benefits of incorporating both, into their routine. Patients will understand the FITT (Frequency, Intensity, Time, and Type) principle and how to use it to build an exercise action plan. In addition, safety concerns and other considerations for exercise and cardiac rehab will be addressed by the presenter. The purpose of this lesson is to promote a comprehensive and effective weekly exercise routine in order to improve patients' overall level of fitness.   Managing Heart Disease: Your Path to a Healthier Heart Clinical staff led group instruction and group discussion with PowerPoint presentation and patient guidebook. To enhance the learning environment the use of posters, models and videos may be added.At the conclusion of this workshop, patients will understand the anatomy and physiology of the heart. Additionally, they will understand how Pritikin's three pillars impact the risk factors, the progression, and the management of heart disease.  The purpose of this lesson is to provide a high-level overview of the heart, heart disease, and how the Pritikin lifestyle positively impacts risk factors.  Exercise Biomechanics Clinical staff led group instruction and group discussion with PowerPoint presentation and patient guidebook. To enhance the learning environment the use of posters, models and videos may be added. Patients will learn how the structural parts of their bodies function and how these functions impact their daily activities, movement, and exercise. Patients will learn how to promote a neutral spine, learn how to manage pain, and identify ways to improve their physical movement in order to promote healthy living. The purpose of this lesson is to expose patients to common physical limitations that impact physical activity. Participants will learn practical ways to adapt and manage aches and pains, and to minimize their  effect on regular exercise. Patients will learn how to maintain good posture while sitting, walking, and lifting.  Balance Training and Fall Prevention  Clinical staff led group instruction and group discussion with PowerPoint presentation and patient guidebook. To enhance the learning environment the use of posters, models and videos may be added. At the conclusion of this workshop, patients will understand the importance of their sensorimotor skills (vision, proprioception, and the vestibular system) in maintaining their ability to balance as they age. Patients will apply a variety of balancing exercises that are appropriate for their  current level of function. Patients will understand the common causes for poor balance, possible solutions to these problems, and ways to modify their physical environment in order to minimize their fall risk. The purpose of this lesson is to teach patients about the importance of maintaining balance as they age and ways to minimize their risk of falling.  WORKSHOPS   Nutrition:  Fueling a Ship broker led group instruction and group discussion with PowerPoint presentation and patient guidebook. To enhance the learning environment the use of posters, models and videos may be added. Patients will review the foundational principles of the Pritikin Eating Plan and understand what constitutes a serving size in each of the food groups. Patients will also learn Pritikin-friendly foods that are better choices when away from home and review make-ahead meal and snack options. Calorie density will be reviewed and applied to three nutrition priorities: weight maintenance, weight loss, and weight gain. The purpose of this lesson is to reinforce (in a group setting) the key concepts around what patients are recommended to eat and how to apply these guidelines when away from home by planning and selecting Pritikin-friendly options. Patients will understand how calorie  density may be adjusted for different weight management goals.  Mindful Eating  Clinical staff led group instruction and group discussion with PowerPoint presentation and patient guidebook. To enhance the learning environment the use of posters, models and videos may be added. Patients will briefly review the concepts of the Pritikin Eating Plan and the importance of low-calorie dense foods. The concept of mindful eating will be introduced as well as the importance of paying attention to internal hunger signals. Triggers for non-hunger eating and techniques for dealing with triggers will be explored. The purpose of this lesson is to provide patients with the opportunity to review the basic principles of the Pritikin Eating Plan, discuss the value of eating mindfully and how to measure internal cues of hunger and fullness using the Hunger Scale. Patients will also discuss reasons for non-hunger eating and learn strategies to use for controlling emotional eating.  Targeting Your Nutrition Priorities Clinical staff led group instruction and group discussion with PowerPoint presentation and patient guidebook. To enhance the learning environment the use of posters, models and videos may be added. Patients will learn how to determine their genetic susceptibility to disease by reviewing their family history. Patients will gain insight into the importance of diet as part of an overall healthy lifestyle in mitigating the impact of genetics and other environmental insults. The purpose of this lesson is to provide patients with the opportunity to assess their personal nutrition priorities by looking at their family history, their own health history and current risk factors. Patients will also be able to discuss ways of prioritizing and modifying the Pritikin Eating Plan for their highest risk areas  Menu  Clinical staff led group instruction and group discussion with PowerPoint presentation and patient guidebook. To  enhance the learning environment the use of posters, models and videos may be added. Using menus brought in from [employer], or printed from [employer], patients will apply the Pritikin dining out guidelines that were presented in the [employer] video. Patients will also be able to practice these guidelines in a variety of provided scenarios. The purpose of this lesson is to provide patients with the opportunity to practice hands-on learning of the Pritikin Dining Out guidelines with actual menus and practice scenarios.  Label Reading Clinical staff led group instruction and  group discussion with PowerPoint presentation and patient guidebook. To enhance the learning environment the use of posters, models and videos may be added. Patients will review and discuss the Pritikin label reading guidelines presented in Pritikin's Label Reading Educational series video. Using fool labels brought in from local grocery stores and markets, patients will apply the label reading guidelines and determine if the packaged food meet the Pritikin guidelines. The purpose of this lesson is to provide patients with the opportunity to review, discuss, and practice hands-on learning of the Pritikin Label Reading guidelines with actual packaged food labels. Cooking School  Pritikin's [employer] are designed to teach patients ways to prepare quick, simple, and affordable recipes at home. The importance of nutrition's role in chronic disease risk reduction is reflected in its emphasis in the overall Pritikin program. By learning how to prepare essential core Pritikin Eating Plan recipes, patients will increase control over what they eat; be able to customize the flavor of foods without the use of added salt, sugar, or fat; and improve the quality of the food they consume. By learning a set of core recipes which are easily assembled, quickly prepared, and affordable, patients are more likely to  prepare more healthy foods at home. These workshops focus on convenient breakfasts, simple entres, side dishes, and desserts which can be prepared with minimal effort and are consistent with nutrition recommendations for cardiovascular risk reduction. Cooking [employer] are taught by a Armed forces logistics/support/administrative officer (RD) who has been trained by the [employer]. The chef or RD has a clear understanding of the importance of minimizing - if not completely eliminating - added fat, sugar, and sodium in recipes. Throughout the series of Cooking School Workshop sessions, patients will learn about healthy ingredients and efficient methods of cooking to build confidence in their capability to prepare    Cooking School weekly topics:  Adding Flavor- Sodium-Free  Fast and Healthy Breakfasts  Powerhouse Plant-Based Proteins  Satisfying Salads and Dressings  Simple Sides and Sauces  International Cuisine-Spotlight on the [employer] Zones  Delicious Desserts  Savory Soups  [employer] - Meals in a Astronomer Appetizers and Snacks  Comforting Weekend Breakfasts  One-Pot Wonders   Fast Evening Meals  Landscape architect Your Pritikin Plate  WORKSHOPS   Healthy Mindset (Psychosocial):  Focused Goals, Sustainable Changes Clinical staff led group instruction and group discussion with PowerPoint presentation and patient guidebook. To enhance the learning environment the use of posters, models and videos may be added. Patients will be able to apply effective goal setting strategies to establish at least one personal goal, and then take consistent, meaningful action toward that goal. They will learn to identify common barriers to achieving personal goals and develop strategies to overcome them. Patients will also gain an understanding of how our mind-set can impact our ability to achieve goals and the importance of cultivating a positive and growth-oriented mind-set. The purpose  of this lesson is to provide patients with a deeper understanding of how to set and achieve personal goals, as well as the tools and strategies needed to overcome common obstacles which may arise along the way.  From Head to Heart: The Power of a Healthy Outlook  Clinical staff led group instruction and group discussion with PowerPoint presentation and patient guidebook. To enhance the learning environment the use of posters, models and videos may be added. Patients will be able to recognize and describe the impact of emotions and mood on  physical health. They will discover the importance of self-care and explore self-care practices which may work for them. Patients will also learn how to utilize the 4 C's to cultivate a healthier outlook and better manage stress and challenges. The purpose of this lesson is to demonstrate to patients how a healthy outlook is an essential part of maintaining good health, especially as they continue their cardiac rehab journey.  Healthy Sleep for a Healthy Heart Clinical staff led group instruction and group discussion with PowerPoint presentation and patient guidebook. To enhance the learning environment the use of posters, models and videos may be added. At the conclusion of this workshop, patients will be able to demonstrate knowledge of the importance of sleep to overall health, well-being, and quality of life. They will understand the symptoms of, and treatments for, common sleep disorders. Patients will also be able to identify daytime and nighttime behaviors which impact sleep, and they will be able to apply these tools to help manage sleep-related challenges. The purpose of this lesson is to provide patients with a general overview of sleep and outline the importance of quality sleep. Patients will learn about a few of the most common sleep disorders. Patients will also be introduced to the concept of "sleep hygiene," and discover ways to self-manage certain sleeping  problems through simple daily behavior changes. Finally, the workshop will motivate patients by clarifying the links between quality sleep and their goals of heart-healthy living.   Recognizing and Reducing Stress Clinical staff led group instruction and group discussion with PowerPoint presentation and patient guidebook. To enhance the learning environment the use of posters, models and videos may be added. At the conclusion of this workshop, patients will be able to understand the types of stress reactions, differentiate between acute and chronic stress, and recognize the impact that chronic stress has on their health. They will also be able to apply different coping mechanisms, such as reframing negative self-talk. Patients will have the opportunity to practice a variety of stress management techniques, such as deep abdominal breathing, progressive muscle relaxation, and/or guided imagery.  The purpose of this lesson is to educate patients on the role of stress in their lives and to provide healthy techniques for coping with it.  Learning Barriers/Preferences:  Learning Barriers/Preferences - 11/20/23 1417       Learning Barriers/Preferences   Learning Barriers Sight;Exercise Concerns   poor balance, falls, pt wears reading glasses   Learning Preferences Audio;Computer/Internet;Group Instruction;Individual Instruction;Pictoral;Skilled Demonstration;Verbal Instruction;Video;Written Material             Education Topics:  Knowledge Questionnaire Score:  Knowledge Questionnaire Score - 11/20/23 1417       Knowledge Questionnaire Score   Pre Score 21/24             Core Components/Risk Factors/Patient Goals at Admission:  Personal Goals and Risk Factors at Admission - 11/20/23 1420       Core Components/Risk Factors/Patient Goals on Admission    Weight Management Yes;Weight Loss    Intervention Weight Management: Develop a combined nutrition and exercise program designed to  reach desired caloric intake, while maintaining appropriate intake of nutrient and fiber, sodium and fats, and appropriate energy expenditure required for the weight goal.;Weight Management: Provide education and appropriate resources to help participant work on and attain dietary goals.;Weight Management/Obesity: Establish reasonable short term and long term weight goals.;Obesity: Provide education and appropriate resources to help participant work on and attain dietary goals.    Diabetes Yes  Intervention Provide education about signs/symptoms and action to take for hypo/hyperglycemia.;Provide education about proper nutrition, including hydration, and aerobic/resistive exercise prescription along with prescribed medications to achieve blood glucose in normal ranges: Fasting glucose 65-99 mg/dL    Expected Outcomes Short Term: Participant verbalizes understanding of the signs/symptoms and immediate care of hyper/hypoglycemia, proper foot care and importance of medication, aerobic/resistive exercise and nutrition plan for blood glucose control.;Long Term: Attainment of HbA1C < 7%.    Heart Failure Yes    Intervention Provide a combined exercise and nutrition program that is supplemented with education, support and counseling about heart failure. Directed toward relieving symptoms such as shortness of breath, decreased exercise tolerance, and extremity edema.    Expected Outcomes Improve functional capacity of life;Short term: Attendance in program 2-3 days a week with increased exercise capacity. Reported lower sodium intake. Reported increased fruit and vegetable intake. Reports medication compliance.;Short term: Daily weights obtained and reported for increase. Utilizing diuretic protocols set by physician.;Long term: Adoption of self-care skills and reduction of barriers for early signs and symptoms recognition and intervention leading to self-care maintenance.    Hypertension Yes    Intervention  Provide education on lifestyle modifcations including regular physical activity/exercise, weight management, moderate sodium restriction and increased consumption of fresh fruit, vegetables, and low fat dairy, alcohol moderation, and smoking cessation.;Monitor prescription use compliance.    Expected Outcomes Short Term: Continued assessment and intervention until BP is < 140/38mm HG in hypertensive participants. < 130/22mm HG in hypertensive participants with diabetes, heart failure or chronic kidney disease.;Long Term: Maintenance of blood pressure at goal levels.    Lipids Yes    Intervention Provide education and support for participant on nutrition & aerobic/resistive exercise along with prescribed medications to achieve LDL 70mg , HDL >40mg .    Expected Outcomes Short Term: Participant states understanding of desired cholesterol values and is compliant with medications prescribed. Participant is following exercise prescription and nutrition guidelines.;Long Term: Cholesterol controlled with medications as prescribed, with individualized exercise RX and with personalized nutrition plan. Value goals: LDL < 70mg , HDL > 40 mg.    Personal Goal Other Yes    Personal Goal Pt would like more flexibility, and more stabole when walking, increase in endurance    Intervention Will continue to monitor pt and progress workloads as tolerated without sign or symptom    Expected Outcomes Pt will achieve her goals and increase strength             Core Components/Risk Factors/Patient Goals Review:    Core Components/Risk Factors/Patient Goals at Discharge (Final Review):    ITP Comments:  ITP Comments     Row Name 11/20/23 0838           ITP Comments Dr. Armanda Magic medical director. Introduction to [facility] education/intensive cardiac rehab. Initial orientation packet reviewed with patient.                Comments: Participant attended orientation for the cardiac rehabilitation program  on  11/20/2023  to perform initial intake and exercise walk test. Patient introduced to the [facility] education and orientation packet was reviewed. Completed 6-minute walk test, measurements, initial ITP, and exercise prescription. Vital signs okay with low Bp post 82/57 no symptoms, recheck with hydration 94/60. Telemetry-normal sinus rhythm with frequent PVC's and couplets, asymptomatic throughout.   Service time was from 0807 to 1025.

## 2023-11-20 NOTE — Progress Notes (Signed)
 Patient here here for cardiac rehab orientation. Entry blood pressure 108/62. Heart rate 81. Telemetry rhythm Sinus with frequent PVC's couplets. Oxygen saturation 94/5 on room air. Blood pressure 6 minute post 110/64 then 82/57. 2 minutes post. Heart rate 87. Patient asymptomatic. Given water. Recheck BP 94/60 sitting. Standing BP 104/62. Dicussed with onsite provider Carlos Levering DNP. Deborah instructed the patient to decrease her Coreg  from 25 mg twice a day to 12.5 mg twice a day. Patient already took 25 mg of coreg this morning and will take a 1/2 25 mg of coreg twice a day starting tomorrow. Patient states understanding. Let a message on the HF voicemail about today's events and will forward today's vital signs to Dr Gasper Lloyd for review.Thayer Headings RN BSN

## 2023-11-24 ENCOUNTER — Encounter (HOSPITAL_COMMUNITY)
Admission: RE | Admit: 2023-11-24 | Discharge: 2023-11-24 | Disposition: A | Discharge status: Home / Self Care | Source: Ambulatory Visit | Attending: Cardiology | Admitting: Cardiology

## 2023-11-24 DIAGNOSIS — I5042 Chronic combined systolic (congestive) and diastolic (congestive) heart failure: Secondary | ICD-10-CM | POA: Diagnosis not present

## 2023-11-24 LAB — GLUCOSE, CAPILLARY
Glucose-Capillary: 151 mg/dL — ABNORMAL HIGH (ref 70–99)
Glucose-Capillary: 141 mg/dL — ABNORMAL HIGH (ref 70–99)

## 2023-11-24 NOTE — Progress Notes (Signed)
 Daily Session Note  Patient Details  Name: Paula Wong MRN: [id number] Date of Birth: [birth year] Referring Provider:   Flowsheet Row INTENSIVE CARDIAC REHAB ORIENT from 11/20/2023 in [facility]  Referring Provider Dr. Dorthula Nettles DO       Encounter Date: 11/24/2023  Check In:  Session Check In - 11/24/23 0705       Check-In   Supervising physician immediately available to respond to emergencies [facility] MD immediately available    Physician(s) Christain Sacramento, NP    Location [facility]    Staff Present Hughie Closs BS, ACSM-CEP, Exercise Physiologist;Kaylee Earlene Plater, MS, ACSM-CEP, Exercise Physiologist;Olinty Peggye Pitt, MS, ACSM-CEP, Exercise Physiologist;Johnny Hale Bogus, MS, Exercise Physiologist;Nerida Boivin Remus Loffler, RN, [place]    Virtual Visit No    Medication changes reported     No    Fall or balance concerns reported    No    Tobacco Cessation No Change    Warm-up and Cool-down Performed as group-led instruction    Resistance Training Performed Yes    VAD Patient? No    PAD/SET Patient? No      Pain Assessment   Currently in Pain? No/denies    Pain Score 0-No pain    Multiple Pain Sites No             Capillary Blood Glucose: Results for orders placed or performed during the hospital encounter of 11/24/23 (from the past 24 hours)  Glucose, capillary     Status: Abnormal   Collection Time: 11/24/23  6:58 AM  Result Value Ref Range   Glucose-Capillary 151 (H) 70 - 99 mg/dL  Glucose, capillary     Status: Abnormal   Collection Time: 11/24/23  8:03 AM  Result Value Ref Range   Glucose-Capillary 141 (H) 70 - 99 mg/dL     Exercise Prescription Changes - 11/24/23 0823       Response to Exercise   Blood Pressure (Admit) 102/66    Blood Pressure (Exercise) 122/70    Blood Pressure (Exit) 102/70    Heart Rate (Admit) 72 bpm    Heart Rate (Exercise) 99 bpm    Heart Rate (Exit) 65 bpm    Rating  of Perceived Exertion (Exercise) 11    Perceived Dyspnea (Exercise) 0    Symptoms none    Comments Reviewed MET's, goals and home ExRx    Duration Continue with 30 min of aerobic exercise without signs/symptoms of physical distress.    Intensity THRR unchanged      Progression   Progression Continue to progress workloads to maintain intensity without signs/symptoms of physical distress.    Average METs 1.9      Resistance Training   Training Prescription Yes    Weight 2    Reps 10-15    Time 10 Minutes      NuStep   Level 1    SPM 72    Minutes 15    METs 1.9             Social History   Tobacco Use  Smoking Status Former   Current packs/day: 0.00   Types: Cigarettes   Quit date: 08/20/2003   Years since quitting: 20.2  Smokeless Tobacco Never    Goals Met:  Independence with exercise equipment Exercise tolerated well No report of concerns or symptoms today Strength training completed today  Goals Unmet:  Not Applicable  Comments: Pt in today for cardiac rehab today. Pt tolerated  light exercise without difficulty. VSS, telemetry-Sinus rhythm with PVCs, asymptomatic. Medication list reconciled at orientation and reports no changes today. Pt denies barriers to medication compliance.  PSYCHOSOCIAL ASSESSMENT:  PHQ9=0. Pt exhibits positive coping skills, hopeful outlook and has her church family for support. No psychosocial needs identified at this time, no psychosocial interventions necessary. Pt oriented to exercise equipment and gym routine. Understanding verbalized.    Dr. Armanda Magic is Medical Director for Cardiac Rehab at [facility].

## 2023-11-26 ENCOUNTER — Encounter (HOSPITAL_COMMUNITY)
Admission: RE | Admit: 2023-11-26 | Discharge: 2023-11-26 | Disposition: A | Discharge status: Home / Self Care | Source: Ambulatory Visit | Attending: Cardiology | Admitting: Cardiology

## 2023-11-26 ENCOUNTER — Telehealth (HOSPITAL_COMMUNITY): Payer: Self-pay | Admitting: *Deleted

## 2023-11-26 DIAGNOSIS — I5042 Chronic combined systolic (congestive) and diastolic (congestive) heart failure: Secondary | ICD-10-CM

## 2023-11-26 LAB — GLUCOSE, CAPILLARY
Glucose-Capillary: 111 mg/dL — ABNORMAL HIGH (ref 70–99)
Glucose-Capillary: 92 mg/dL (ref 70–99)
Glucose-Capillary: 134 mg/dL — ABNORMAL HIGH (ref 70–99)

## 2023-11-26 NOTE — Telephone Encounter (Signed)
-----   Message from [facility] sent at 11/26/2023  3:37 PM EDT ----- That sounds fine. Thank you. ----- Message ----- From: Cammy Copa, RN Sent: 11/20/2023   2:21 PM EDT To: Theresia Bough, CMA; Dorthula Nettles, DO; #  Good afternoon Dr Gasper Lloyd, Ms Tiburcio Pea attended cardiac rehab orientation this morning and was noted to be hypotensive. Please review attached media. Our onsite provider Carlos Levering NP decreased Ms Boyack coreg from 25 mg  to 12.5 mg twice a day.  Please advise the patient with any further instructions as necessary.  Thank you! Sincerely, [facility]

## 2023-11-28 ENCOUNTER — Encounter (HOSPITAL_COMMUNITY)
Admission: RE | Admit: 2023-11-28 | Discharge: 2023-11-28 | Disposition: A | Discharge status: Home / Self Care | Source: Ambulatory Visit | Attending: Cardiology | Admitting: Cardiology

## 2023-11-28 DIAGNOSIS — I5042 Chronic combined systolic (congestive) and diastolic (congestive) heart failure: Secondary | ICD-10-CM | POA: Diagnosis not present

## 2023-11-28 LAB — GLUCOSE, CAPILLARY
Glucose-Capillary: 125 mg/dL — ABNORMAL HIGH (ref 70–99)
Glucose-Capillary: 110 mg/dL — ABNORMAL HIGH (ref 70–99)

## 2023-12-01 ENCOUNTER — Encounter (HOSPITAL_COMMUNITY)
Admission: RE | Admit: 2023-12-01 | Discharge: 2023-12-01 | Disposition: A | Discharge status: Home / Self Care | Source: Ambulatory Visit | Attending: Cardiology | Admitting: Cardiology

## 2023-12-01 DIAGNOSIS — I5042 Chronic combined systolic (congestive) and diastolic (congestive) heart failure: Secondary | ICD-10-CM | POA: Diagnosis not present

## 2023-12-03 ENCOUNTER — Encounter (HOSPITAL_COMMUNITY)
Admission: RE | Admit: 2023-12-03 | Discharge: 2023-12-03 | Disposition: A | Discharge status: Home / Self Care | Source: Ambulatory Visit | Attending: Cardiology | Admitting: Cardiology

## 2023-12-03 DIAGNOSIS — I5042 Chronic combined systolic (congestive) and diastolic (congestive) heart failure: Secondary | ICD-10-CM | POA: Diagnosis not present

## 2023-12-05 ENCOUNTER — Encounter (HOSPITAL_COMMUNITY)
Admission: RE | Admit: 2023-12-05 | Discharge: 2023-12-05 | Disposition: A | Discharge status: Home / Self Care | Source: Ambulatory Visit | Attending: Cardiology | Admitting: Cardiology

## 2023-12-05 DIAGNOSIS — I5042 Chronic combined systolic (congestive) and diastolic (congestive) heart failure: Secondary | ICD-10-CM | POA: Diagnosis not present

## 2023-12-08 ENCOUNTER — Encounter (HOSPITAL_COMMUNITY)
Admission: RE | Admit: 2023-12-08 | Discharge: 2023-12-08 | Disposition: A | Discharge status: Home / Self Care | Source: Ambulatory Visit | Attending: Cardiology | Admitting: Cardiology

## 2023-12-08 DIAGNOSIS — I5042 Chronic combined systolic (congestive) and diastolic (congestive) heart failure: Secondary | ICD-10-CM

## 2023-12-08 NOTE — Progress Notes (Signed)
 Cardiac Individual Treatment Plan  Patient Details  Name: Paula Wong MRN: [id number] Date of Birth: 1946/04/10 Referring Provider:   Flowsheet Row INTENSIVE CARDIAC REHAB ORIENT from 11/20/2023 in [facility] for Heart, Vascular, & Lung Health  Referring Provider Dr. Alwin Baars DO       Initial Encounter Date:  Flowsheet Row INTENSIVE CARDIAC REHAB ORIENT from 11/20/2023 in [facility] for Heart, Vascular, & Lung Health  Date 11/20/23       Visit Diagnosis: Heart failure, systolic and diastolic, chronic (HCC)  Patient's Home Medications on Admission:  Current Outpatient Medications:    acetaminophen  (TYLENOL ) 325 MG tablet, Take 2 tablets (650 mg total) by mouth every 4 (four) hours as needed for headache or mild pain (pain score 1-3)., Disp: , Rfl:    amiodarone  (PACERONE ) 200 MG tablet, Take 200 mg by mouth daily., Disp: , Rfl:    aspirin  81 MG chewable tablet, Chew 1 tablet (81 mg total) by mouth daily., Disp: 90 tablet, Rfl: 3   atorvastatin  (LIPITOR ) 80 MG tablet, Take 80 mg by mouth at bedtime., Disp: , Rfl:    B-D ULTRAFINE III SHORT PEN 31G X 8 MM MISC, Inject into the skin daily., Disp: , Rfl:    carvedilol  (COREG ) 25 MG tablet, Take 1 tablet (25 mg total) by mouth 2 (two) times daily. (Patient taking differently: Take 12.5 mg by mouth 2 (two) times daily. 11/20/23 Dose decreased to 12.5 mg twice a day by Morey Ar NP. MWW RN), Disp: 60 tablet, Rfl: 11   clopidogrel  (PLAVIX ) 75 MG tablet, Take 1 tablet (75 mg total) by mouth daily., Disp: 30 tablet, Rfl: 11   FARXIGA  10 MG TABS tablet, Take 10 mg by mouth daily., Disp: , Rfl:    furosemide  (LASIX ) 20 MG tablet, Take 1 tablet (20 mg total) by mouth daily. May take an additional tablet as needed for swelling/SOB, Disp: 100 tablet, Rfl: 3   gabapentin  (NEURONTIN ) 600 MG tablet, Take 600 mg by mouth 2 (two) times daily., Disp: , Rfl:    magnesium  oxide (MAG-OX) 400  MG tablet, Take 400 mg by mouth daily., Disp: , Rfl:    metFORMIN  (GLUCOPHAGE -XR) 500 MG 24 hr tablet, Take 500 mg by mouth daily with breakfast., Disp: , Rfl:    nitroGLYCERIN  (NITROSTAT ) 0.4 MG SL tablet, Place 1 tablet (0.4 mg total) under the tongue every 5 (five) minutes x 3 doses as needed for chest pain., Disp: 25 tablet, Rfl: 2   ONETOUCH ULTRA test strip, USE TO CHECK BLOOD SUGARS ONCE DAILY AND AS NEEDED, Disp: , Rfl:    OZEMPIC, 1 MG/DOSE, 4 MG/3ML SOPN, Inject 1 mg into the skin once a week., Disp: , Rfl:    pantoprazole  (PROTONIX ) 20 MG tablet, Take 1 tablet by mouth once daily, Disp: 90 tablet, Rfl: 0   sacubitril -valsartan  (ENTRESTO ) 97-103 MG, Take 1 tablet by mouth 2 (two) times daily., Disp: 60 tablet, Rfl: 5   spironolactone  (ALDACTONE ) 25 MG tablet, Take 1 tablet (25 mg total) by mouth daily., Disp: 90 tablet, Rfl: 3   TRESIBA FLEXTOUCH 200 UNIT/ML SOPN, Take 80 Units by mouth at bedtime. , Disp: , Rfl:   Past Medical History: Past Medical History:  Diagnosis Date   Allergy    CAD (coronary artery disease)    a. 03/2022 Cath: LM 40ost, 5m, LAD 40ost/p, 24m, 44m, LCX 78m, RCA nl, RV branch 70-->Med rx.   Chronic HFrEF (heart failure with reduced ejection  fraction) (HCC)    a. 11/2021 Echo: EF 20-25%; b. 03/2022 Echo: EF 20-25%; c. 11/2022 Echo: EF 25-30%, glob HK.   Diabetes (HCC)    Diverticulosis    GERD (gastroesophageal reflux disease)    Hiatal hernia    Hyperlipidemia    Hypertension    Implantable loop recorder present    a. 03/2022 s/p Abbott IQ EL+ (serial [id number]) ILR in setting of cryptogenic stroke.   Ischemic cardiomyopathy    a. 11/2021 Echo: EF 20-25%; b. 03/2022 Echo: EF 20-25%; c. 11/2022 Echo: EF 25-30%, glob HK, nl RV fxn, mild-mod MR, mild AI, AoV sclerosis.   Neuropathy    feet   Schatzki's ring    Stroke [facility])    a. 03/2022 MRI Brain: subacute cortical infarct involving inf and med L occipial pole w/ petechial hemorrhage; b. 03/2022 s/p Abbott Assert  IQ EL ILR.   Ventricular tachycardia (HCC)    a. 12/2022 noted on ILR.    Tobacco Use: Social History   Tobacco Use  Smoking Status Former   Current packs/day: 0.00   Types: Cigarettes   Quit date: 08/20/2003   Years since quitting: 20.3  Smokeless Tobacco Never    Labs: Review Flowsheet  More data exists      Latest Ref Rng & Units 10/02/2020 04/13/2022 12/31/2022 03/06/2023 05/09/2023  Labs for ITP Cardiac and Pulmonary Rehab  Cholestrol 0 - 200 mg/dL 409  811  914  - 782   LDL (calc) 0 - 99 mg/dL 90  70  41  - 60   HDL-C >40 mg/dL 41  35  39  - 43   Trlycerides <150 mg/dL 956  82  213  - 086   Hemoglobin A1c 4.8 - 5.6 % - 6.3  - 6.2  -    Capillary Blood Glucose: Lab Results  Component Value Date   GLUCAP 110 (H) 11/28/2023   GLUCAP 125 (H) 11/28/2023   GLUCAP 134 (H) 11/26/2023   GLUCAP 111 (H) 11/26/2023   GLUCAP 92 11/26/2023     Exercise Target Goals: Exercise Program Goal: Individual exercise prescription set using results from initial 6 min walk test and THRR while considering  patient's activity barriers and safety.   Exercise Prescription Goal: Initial exercise prescription builds to 30-45 minutes a day of aerobic activity, 2-3 days per week.  Home exercise guidelines will be given to patient during program as part of exercise prescription that the participant will acknowledge.  Activity Barriers & Risk Stratification:  Activity Barriers & Cardiac Risk Stratification - 11/20/23 0832       Activity Barriers & Cardiac Risk Stratification   Activity Barriers Deconditioning;Assistive Device;Decreased Ventricular Function;Balance Concerns;History of Falls    Cardiac Risk Stratification High             6 Minute Walk:  6 Minute Walk     Row Name 11/20/23 1356         6 Minute Walk   Phase Initial     Distance 750 feet     Walk Time 6 minutes     # of Rest Breaks 1  seated rest break for fatigue. from 3.00-1.19     MPH 1.42     METS 1.02     RPE  11     Perceived Dyspnea  0     VO2 Peak 3.56     Symptoms Yes (comment)     Comments Asymptomatic: low BP post walk at 82/57, H2o recheck seated 94/60  and standing: 104/62     Resting HR 81 bpm     Resting BP 108/62     Resting Oxygen Saturation  94 %     Exercise Oxygen Saturation  during 6 min walk 98 %     Max Ex. HR 106 bpm     Max Ex. BP 110/64     2 Minute Post BP 82/57  recheck 94/60 both asymptomatic              Oxygen Initial Assessment:   Oxygen Re-Evaluation:   Oxygen Discharge (Final Oxygen Re-Evaluation):   Initial Exercise Prescription:  Initial Exercise Prescription - 11/20/23 1400       Date of Initial Exercise RX and Referring Provider   Date 11/20/23    Referring Provider Dr. Alwin Baars DO    Expected Discharge Date 02/11/24      NuStep   Level 1    SPM 60    Minutes 15    METs 1.2      Prescription Details   Frequency (times per week) 3    Duration Progress to 30 minutes of continuous aerobic without signs/symptoms of physical distress      Intensity   THRR 40-80% of Max Heartrate 57-114    Ratings of Perceived Exertion 11-13    Perceived Dyspnea 0-4      Progression   Progression Continue progressive overload as per policy without signs/symptoms or physical distress.      Resistance Training   Training Prescription Yes    Weight 2    Reps 10-15             Perform Capillary Blood Glucose checks as needed.  Exercise Prescription Changes:   Exercise Prescription Changes     Row Name 11/24/23 [phone] 12/08/23 0820           Response to Exercise   Blood Pressure (Admit) 102/66 104/64      Blood Pressure (Exercise) 122/70 138/62      Blood Pressure (Exit) 102/70 106/58      Heart Rate (Admit) 72 bpm 85 bpm      Heart Rate (Exercise) 99 bpm 99 bpm      Heart Rate (Exit) 65 bpm 86 bpm      Rating of Perceived Exertion (Exercise) 11 9      Perceived Dyspnea (Exercise) 0 0      Symptoms none none      Comments  Reviewed MET's, goals and home ExRx Reviewed MET's, goals and home ExRx      Duration Continue with 30 min of aerobic exercise without signs/symptoms of physical distress. Continue with 30 min of aerobic exercise without signs/symptoms of physical distress.      Intensity THRR unchanged THRR unchanged        Progression   Progression Continue to progress workloads to maintain intensity without signs/symptoms of physical distress. Continue to progress workloads to maintain intensity without signs/symptoms of physical distress.      Average METs 1.9 2        Resistance Training   Training Prescription Yes Yes      Weight 2 2      Reps 10-15 10-15      Time 10 Minutes 10 Minutes        NuStep   Level 1 1      SPM 72 72      Minutes 15 15      METs 1.9 1.9  Home Exercise Plan   Plans to continue exercise at -- Home (comment)      Frequency -- Add 2 additional days to program exercise sessions.      Initial Home Exercises Provided -- 12/08/23               Exercise Comments:   Exercise Comments     Row Name 11/24/23 0836 12/08/23 0824         Exercise Comments Pt first day in the [facility] iCR program. Pt tolerated exercise well with an average MET level of 1.9. Pt is learning her THRR, RPE and ExRx. Reviewed MET's, goals and home ExRx. Pt tolerated exercise well with an average MET level of 2.0. Pt is doing well and is feeling an increase in strength and stamina. She says shes feeling good and is going to start walking on her own. She will start for 15-30 mins 1.2 days.               Exercise Goals and Review:   Exercise Goals     Row Name 11/20/23 [phone]             Exercise Goals   Increase Physical Activity Yes       Intervention Provide advice, education, support and counseling about physical activity/exercise needs.;Develop an individualized exercise prescription for aerobic and resistive training based on initial evaluation findings, risk  stratification, comorbidities and participant's personal goals.       Expected Outcomes Short Term: Attend rehab on a regular basis to increase amount of physical activity.;Long Term: Exercising regularly at least 3-5 days a week.;Long Term: Add in home exercise to make exercise part of routine and to increase amount of physical activity.       Increase Strength and Stamina Yes       Intervention Develop an individualized exercise prescription for aerobic and resistive training based on initial evaluation findings, risk stratification, comorbidities and participant's personal goals.;Provide advice, education, support and counseling about physical activity/exercise needs.       Expected Outcomes Short Term: Increase workloads from initial exercise prescription for resistance, speed, and METs.;Short Term: Perform resistance training exercises routinely during rehab and add in resistance training at home;Long Term: Improve cardiorespiratory fitness, muscular endurance and strength as measured by increased METs and functional capacity ( )       Able to understand and use rate of perceived exertion (RPE) scale Yes       Intervention Provide education and explanation on how to use RPE scale       Expected Outcomes Short Term: Able to use RPE daily in rehab to express subjective intensity level;Long Term:  Able to use RPE to guide intensity level when exercising independently       Knowledge and understanding of Target Heart Rate Range (THRR) Yes       Intervention Provide education and explanation of THRR including how the numbers were predicted and where they are located for reference       Expected Outcomes Short Term: Able to state/look up THRR;Long Term: Able to use THRR to govern intensity when exercising independently;Short Term: Able to use daily as guideline for intensity in rehab       Understanding of Exercise Prescription Yes       Intervention Provide education, explanation, and written  materials on patient's individual exercise prescription       Expected Outcomes Short Term: Able to explain program exercise prescription;Long Term: Able to explain home exercise prescription  to exercise independently                Exercise Goals Re-Evaluation :  Exercise Goals Re-Evaluation     Row Name 11/24/23 0827 12/08/23 0821           Exercise Goal Re-Evaluation   Exercise Goals Review Increase Physical Activity;Understanding of Exercise Prescription;Increase Strength and Stamina;Knowledge and understanding of Target Heart Rate Range (THRR);Able to understand and use rate of perceived exertion (RPE) scale Increase Physical Activity;Understanding of Exercise Prescription;Increase Strength and Stamina;Knowledge and understanding of Target Heart Rate Range (THRR);Able to understand and use rate of perceived exertion (RPE) scale      Comments Pt first day in the [facility] iCR program. Pt tolerated exercise well with an average MET level of 1.9. Pt is learning her THRR, RPE and ExRx Reviewed MET's, goals and home ExRx. Pt tolerated exercise well with an average MET level of 2.0. Pt is doing well and is feeling an increase in strength and stamina. She says shes feeling good and is going to start walking on her own. She will start for 15-30 mins 1.2 days.      Expected Outcomes Will continue to monitor pt and progress workloads as tolerated without sign or symptom Will continue to monitor pt and progress workloads as tolerated without sign or symptom               Discharge Exercise Prescription (Final Exercise Prescription Changes):  Exercise Prescription Changes - 12/08/23 0820       Response to Exercise   Blood Pressure (Admit) 104/64    Blood Pressure (Exercise) 138/62    Blood Pressure (Exit) 106/58    Heart Rate (Admit) 85 bpm    Heart Rate (Exercise) 99 bpm    Heart Rate (Exit) 86 bpm    Rating of Perceived Exertion (Exercise) 9    Perceived Dyspnea (Exercise) 0     Symptoms none    Comments Reviewed MET's, goals and home ExRx    Duration Continue with 30 min of aerobic exercise without signs/symptoms of physical distress.    Intensity THRR unchanged      Progression   Progression Continue to progress workloads to maintain intensity without signs/symptoms of physical distress.    Average METs 2      Resistance Training   Training Prescription Yes    Weight 2    Reps 10-15    Time 10 Minutes      NuStep   Level 1    SPM 72    Minutes 15    METs 1.9      Home Exercise Plan   Plans to continue exercise at Home (comment)    Frequency Add 2 additional days to program exercise sessions.    Initial Home Exercises Provided 12/08/23             Nutrition:  Target Goals: Understanding of nutrition guidelines, daily intake of sodium 1500mg , cholesterol 200mg , calories 30% from fat and 7% or less from saturated fats, daily to have 5 or more servings of fruits and vegetables.  Biometrics:  Pre Biometrics - 11/20/23 0959       Pre Biometrics   Waist Circumference 41.5 inches    Hip Circumference 43 inches    Waist to Hip Ratio 0.97 %    Triceps Skinfold 38 mm    % Body Fat 46.7 %    Grip Strength 25 kg    Flexibility --   unable to reach  Single Leg Stand 4 seconds              Nutrition Therapy Plan and Nutrition Goals:  Nutrition Therapy & Goals - 11/24/23 1023       Nutrition Therapy   Diet Heart Healthy Diet    Drug/Food Interactions Statins/Certain Fruits      Personal Nutrition Goals   Nutrition Goal Patient to identify strategies for reducing cardiovascular risk by attending the [facility] education and nutrition series weekly.    Personal Goal #2 Patient to improve diet quality by using the plate method as a guide for meal planning to include lean protein/plant protein, fruits, vegetables, whole grains, nonfat dairy as part of a well-balanced diet.    Personal Goal #3 Patient to identify strategies for weight loss of  0.5-2.0# per week.    Comments Patient has medical history of CVA, HTN, HFrEF, CAD, CKD2, DM2, hyperlipidemia. A1c is well controlled. Lipids are at goal. She is taking ozempic to aid with weight loss/blood sugar control. Patient will benefit from participation in intensive cardiac rehab for nutrition, exercise, and lifestyle modification.      Intervention Plan   Intervention Prescribe, educate and counsel regarding individualized specific dietary modifications aiming towards targeted core components such as weight, hypertension, lipid management, diabetes, heart failure and other comorbidities.;Nutrition handout(s) given to patient.    Expected Outcomes Short Term Goal: Understand basic principles of dietary content, such as calories, fat, sodium, cholesterol and nutrients.;Long Term Goal: Adherence to prescribed nutrition plan.             Nutrition Assessments:  Nutrition Assessments - 12/01/23 1429       Rate Your Plate Scores   Pre Score 47            MEDIFICTS Score Key: >=70 Need to make dietary changes  40-70 Heart Healthy Diet <= 40 Therapeutic Level Cholesterol Diet   Flowsheet Row INTENSIVE CARDIAC REHAB from 12/01/2023 in [facility] for Heart, Vascular, & Lung Health  Picture Your Plate Total Score on Admission 47      Picture Your Plate Scores: <16 Unhealthy dietary pattern with much room for improvement. 41-50 Dietary pattern unlikely to meet recommendations for good health and room for improvement. 51-60 More healthful dietary pattern, with some room for improvement.  >60 Healthy dietary pattern, although there may be some specific behaviors that could be improved.    Nutrition Goals Re-Evaluation:  Nutrition Goals Re-Evaluation     Row Name 11/24/23 1023             Goals   Current Weight 191 lb 5.8 oz (86.8 kg)       Comment GFR 54, Cr 1,05, A1c 6.2, lipids WNL, LDL 60       Expected Outcome Patient has medical history of  CVA, HTN, HFrEF, CAD, CKD2, DM2, hyperlipidemia. A1c is well controlled. Lipids are at goal. She is taking ozempic to aid with weight loss/blood sugar control. Patient will benefit from participation in intensive cardiac rehab for nutrition, exercise, and lifestyle modification.                Nutrition Goals Re-Evaluation:  Nutrition Goals Re-Evaluation     Row Name 11/24/23 1023             Goals   Current Weight 191 lb 5.8 oz (86.8 kg)       Comment GFR 54, Cr 1,05, A1c 6.2, lipids WNL, LDL 60  Expected Outcome Patient has medical history of CVA, HTN, HFrEF, CAD, CKD2, DM2, hyperlipidemia. A1c is well controlled. Lipids are at goal. She is taking ozempic to aid with weight loss/blood sugar control. Patient will benefit from participation in intensive cardiac rehab for nutrition, exercise, and lifestyle modification.                Nutrition Goals Discharge (Final Nutrition Goals Re-Evaluation):  Nutrition Goals Re-Evaluation - 11/24/23 1023       Goals   Current Weight 191 lb 5.8 oz (86.8 kg)    Comment GFR 54, Cr 1,05, A1c 6.2, lipids WNL, LDL 60    Expected Outcome Patient has medical history of CVA, HTN, HFrEF, CAD, CKD2, DM2, hyperlipidemia. A1c is well controlled. Lipids are at goal. She is taking ozempic to aid with weight loss/blood sugar control. Patient will benefit from participation in intensive cardiac rehab for nutrition, exercise, and lifestyle modification.             Psychosocial: Target Goals: Acknowledge presence or absence of significant depression and/or stress, maximize coping skills, provide positive support system. Participant is able to verbalize types and ability to use techniques and skills needed for reducing stress and depression.  Initial Review & Psychosocial Screening:  Initial Psych Review & Screening - 11/20/23 0835       Initial Review   Current issues with None Identified      Family Dynamics   Good Support System? Yes    Her church family is her support     Barriers   Psychosocial barriers to participate in program The patient should benefit from training in stress management and relaxation.      Screening Interventions   Interventions Encouraged to exercise;To provide support and resources with identified psychosocial needs;Provide feedback about the scores to participant    Expected Outcomes Long Term Goal: Stressors or current issues are controlled or eliminated.;Short Term goal: Identification and review with participant of any Quality of Life or Depression concerns found by scoring the questionnaire.;Long Term goal: The participant improves quality of Life and PHQ9 Scores as seen by post scores and/or verbalization of changes             Quality of Life Scores:  Quality of Life - 11/20/23 1416       Quality of Life   Select Quality of Life      Quality of Life Scores   Health/Function Pre 28.36 %    Socioeconomic Pre 27.6 %    Psych/Spiritual Pre 30 %    Family Pre 24 %    GLOBAL Pre 28 %            Scores of 19 and below usually indicate a poorer quality of life in these areas.  A difference of  2-3 points is a clinically meaningful difference.  A difference of 2-3 points in the total score of the Quality of Life Index has been associated with significant improvement in overall quality of life, self-image, physical symptoms, and general health in studies assessing change in quality of life.  PHQ-9: Review Flowsheet       11/20/2023  Depression screen PHQ 2/9  Decreased Interest 0  Down, Depressed, Hopeless 0  PHQ - 2 Score 0  Altered sleeping 0  Tired, decreased energy 0  Change in appetite 0  Feeling bad or failure about yourself  0  Trouble concentrating 0  Moving slowly or fidgety/restless 0  Suicidal thoughts 0  PHQ-9 Score 0  Difficult doing work/chores --   Interpretation of Total Score  Total Score Depression Severity:  1-4 = Minimal depression, 5-9 = Mild  depression, 10-14 = Moderate depression, 15-19 = Moderately severe depression, 20-27 = Severe depression   Psychosocial Evaluation and Intervention:   Psychosocial Re-Evaluation:  Psychosocial Re-Evaluation     Row Name 12/04/23 1124             Psychosocial Re-Evaluation   Current issues with None Identified       Interventions Encouraged to attend Cardiac Rehabilitation for the exercise       Continue Psychosocial Services  No Follow up required                Psychosocial Discharge (Final Psychosocial Re-Evaluation):  Psychosocial Re-Evaluation - 12/04/23 1124       Psychosocial Re-Evaluation   Current issues with None Identified    Interventions Encouraged to attend Cardiac Rehabilitation for the exercise    Continue Psychosocial Services  No Follow up required             Vocational Rehabilitation: Provide vocational rehab assistance to qualifying candidates.   Vocational Rehab Evaluation & Intervention:  Vocational Rehab - 11/20/23 1420       Initial Vocational Rehab Evaluation & Intervention   Assessment shows need for Vocational Rehabilitation No   no needs, Pt is retired            Education: Education Goals: Education classes will be provided on a weekly basis, covering required topics. Participant will state understanding/return demonstration of topics presented.    Education     Row Name 11/24/23 0800     Education   Cardiac Education Topics [facility]   Select Core Videos     Core Videos   Educator Exercise Physiologist   Select Nutrition   Nutrition Fueling a Healthy Body   Instruction Review Code 1- Verbalizes Understanding   Class Start Time 0815   Class Stop Time 0900   Class Time Calculation (min) 45 min    Row Name 11/26/23 0900     Education   Cardiac Education Topics [facility]   Orthoptist   Educator Dietitian   Weekly Topic International Cuisine- Spotlight on the [facility] Zones    Instruction Review Code 1- Verbalizes Understanding   Class Start Time 0815   Class Stop Time 0850   Class Time Calculation (min) 35 min    Row Name 12/01/23 0900     Education   Cardiac Education Topics [facility]   Select Workshops     Workshops   Educator Exercise Physiologist   Select Psychosocial   Psychosocial Workshop Healthy Sleep for a Healthy Heart   Instruction Review Code 1- Verbalizes Understanding   Class Start Time [id number]   Class Stop Time 0858   Class Time Calculation (min) 46 min    Row Name 12/03/23 0900     Education   Cardiac Education Topics [facility]   Secondary school teacher School   Educator Dietitian   Weekly Topic Simple Sides and Sauces   Instruction Review Code 1- Verbalizes Understanding   Class Start Time 0815   Class Stop Time 0855   Class Time Calculation (min) 40 min    Row Name 12/05/23 0900     Education   Cardiac Education Topics [facility]   Psychologist, forensic  Psychosocial   Psychosocial How Our Thoughts Can Heal Our Hearts   Instruction Review Code 1- Verbalizes Understanding   Class Start Time [id number]   Class Stop Time 0848   Class Time Calculation (min) 36 min            Core Videos: Exercise    Move It!  Clinical staff conducted group or individual video education with verbal and written material and guidebook.  Patient learns the recommended Pritikin exercise program. Exercise with the goal of living a long, healthy life. Some of the health benefits of exercise include controlled diabetes, healthier blood pressure levels, improved cholesterol levels, improved heart and lung capacity, improved sleep, and better body composition. Everyone should speak with their doctor before starting or changing an exercise routine.  Biomechanical Limitations Clinical staff conducted group or individual video education with verbal and written material and guidebook.   Patient learns how biomechanical limitations can impact exercise and how we can mitigate and possibly overcome limitations to have an impactful and balanced exercise routine.  Body Composition Clinical staff conducted group or individual video education with verbal and written material and guidebook.  Patient learns that body composition (ratio of muscle mass to fat mass) is a key component to assessing overall fitness, rather than body weight alone. Increased fat mass, especially visceral belly fat, can put us  at increased risk for metabolic syndrome, type 2 diabetes, heart disease, and even death. It is recommended to combine diet and exercise (cardiovascular and resistance training) to improve your body composition. Seek guidance from your physician and exercise physiologist before implementing an exercise routine.  Exercise Action Plan Clinical staff conducted group or individual video education with verbal and written material and guidebook.  Patient learns the recommended strategies to achieve and enjoy long-term exercise adherence, including variety, self-motivation, self-efficacy, and positive decision making. Benefits of exercise include fitness, good health, weight management, more energy, better sleep, less stress, and overall well-being.  Medical   Heart Disease Risk Reduction Clinical staff conducted group or individual video education with verbal and written material and guidebook.  Patient learns our heart is our most vital organ as it circulates oxygen, nutrients, white blood cells, and hormones throughout the entire body, and carries waste away. Data supports a plant-based eating plan like the Pritikin Program for its effectiveness in slowing progression of and reversing heart disease. The video provides a number of recommendations to address heart disease.   Metabolic Syndrome and Belly Fat  Clinical staff conducted group or individual video education with verbal and written  material and guidebook.  Patient learns what metabolic syndrome is, how it leads to heart disease, and how one can reverse it and keep it from coming back. You have metabolic syndrome if you have 3 of the following 5 criteria: abdominal obesity, high blood pressure, high triglycerides, low HDL cholesterol, and high blood sugar.  Hypertension and Heart Disease Clinical staff conducted group or individual video education with verbal and written material and guidebook.  Patient learns that high blood pressure, or hypertension, is very common in the [place] . Hypertension is largely due to excessive salt intake, but other important risk factors include being overweight, physical inactivity, drinking too much alcohol, smoking, and not eating enough potassium from fruits and vegetables. High blood pressure is a leading risk factor for heart attack, stroke, congestive heart failure, dementia, kidney failure, and premature death. Long-term effects of excessive salt intake include stiffening of the arteries and thickening of heart muscle and organ  damage. Recommendations include ways to reduce hypertension and the risk of heart disease.  Diseases of Our Time - Focusing on Diabetes Clinical staff conducted group or individual video education with verbal and written material and guidebook.  Patient learns why the best way to stop diseases of our time is prevention, through food and other lifestyle changes. Medicine (such as prescription pills and surgeries) is often only a Band-Aid on the problem, not a long-term solution. Most common diseases of our time include obesity, type 2 diabetes, hypertension, heart disease, and cancer. The Pritikin Program is recommended and has been proven to help reduce, reverse, and/or prevent the damaging effects of metabolic syndrome.  Nutrition   Overview of the Pritikin Eating Plan  Clinical staff conducted group or individual video education with verbal and written material  and guidebook.  Patient learns about the Pritikin Eating Plan for disease risk reduction. The Pritikin Eating Plan emphasizes a wide variety of unrefined, minimally-processed carbohydrates, like fruits, vegetables, whole grains, and legumes. Go, Caution, and Stop food choices are explained. Plant-based and lean animal proteins are emphasized. Rationale provided for low sodium intake for blood pressure control, low added sugars for blood sugar stabilization, and low added fats and oils for coronary artery disease risk reduction and weight management.  Calorie Density  Clinical staff conducted group or individual video education with verbal and written material and guidebook.  Patient learns about calorie density and how it impacts the Pritikin Eating Plan. Knowing the characteristics of the food you choose will help you decide whether those foods will lead to weight gain or weight loss, and whether you want to consume more or less of them. Weight loss is usually a side effect of the Pritikin Eating Plan because of its focus on low calorie-dense foods.  Label Reading  Clinical staff conducted group or individual video education with verbal and written material and guidebook.  Patient learns about the Pritikin recommended label reading guidelines and corresponding recommendations regarding calorie density, added sugars, sodium content, and whole grains.  Dining Out - Part 1  Clinical staff conducted group or individual video education with verbal and written material and guidebook.  Patient learns that restaurant meals can be sabotaging because they can be so high in calories, fat, sodium, and/or sugar. Patient learns recommended strategies on how to positively address this and avoid unhealthy pitfalls.  Facts on Fats  Clinical staff conducted group or individual video education with verbal and written material and guidebook.  Patient learns that lifestyle modifications can be just as effective, if not  more so, as many medications for lowering your risk of heart disease. A Pritikin lifestyle can help to reduce your risk of inflammation and atherosclerosis (cholesterol build-up, or plaque, in the artery walls). Lifestyle interventions such as dietary choices and physical activity address the cause of atherosclerosis. A review of the types of fats and their impact on blood cholesterol levels, along with dietary recommendations to reduce fat intake is also included.  Nutrition Action Plan  Clinical staff conducted group or individual video education with verbal and written material and guidebook.  Patient learns how to incorporate Pritikin recommendations into their lifestyle. Recommendations include planning and keeping personal health goals in mind as an important part of their success.  Healthy Mind-Set    Healthy Minds, Bodies, Hearts  Clinical staff conducted group or individual video education with verbal and written material and guidebook.  Patient learns how to identify when they are stressed. Video will discuss the impact  of that stress, as well as the many benefits of stress management. Patient will also be introduced to stress management techniques. The way we think, act, and feel has an impact on our hearts.  How Our Thoughts Can Heal Our Hearts  Clinical staff conducted group or individual video education with verbal and written material and guidebook.  Patient learns that negative thoughts can cause depression and anxiety. This can result in negative lifestyle behavior and serious health problems. Cognitive behavioral therapy is an effective method to help control our thoughts in order to change and improve our emotional outlook.  Additional Videos:  Exercise    Improving Performance  Clinical staff conducted group or individual video education with verbal and written material and guidebook.  Patient learns to use a non-linear approach by alternating intensity levels and lengths of  time spent exercising to help burn more calories and lose more body fat. Cardiovascular exercise helps improve heart health, metabolism, hormonal balance, blood sugar control, and recovery from fatigue. Resistance training improves strength, endurance, balance, coordination, reaction time, metabolism, and muscle mass. Flexibility exercise improves circulation, posture, and balance. Seek guidance from your physician and exercise physiologist before implementing an exercise routine and learn your capabilities and proper form for all exercise.  Introduction to Yoga  Clinical staff conducted group or individual video education with verbal and written material and guidebook.  Patient learns about yoga, a discipline of the coming together of mind, breath, and body. The benefits of yoga include improved flexibility, improved range of motion, better posture and core strength, increased lung function, weight loss, and positive self-image. Yoga's heart health benefits include lowered blood pressure, healthier heart rate, decreased cholesterol and triglyceride levels, improved immune function, and reduced stress. Seek guidance from your physician and exercise physiologist before implementing an exercise routine and learn your capabilities and proper form for all exercise.  Medical   Aging: Enhancing Your Quality of Life  Clinical staff conducted group or individual video education with verbal and written material and guidebook.  Patient learns key strategies and recommendations to stay in good physical health and enhance quality of life, such as prevention strategies, having an advocate, securing a Health Care Proxy and Power of Attorney, and keeping a list of medications and system for tracking them. It also discusses how to avoid risk for bone loss.  Biology of Weight Control  Clinical staff conducted group or individual video education with verbal and written material and guidebook.  Patient learns that weight  gain occurs because we consume more calories than we burn (eating more, moving less). Even if your body weight is normal, you may have higher ratios of fat compared to muscle mass. Too much body fat puts you at increased risk for cardiovascular disease, heart attack, stroke, type 2 diabetes, and obesity-related cancers. In addition to exercise, following the Pritikin Eating Plan can help reduce your risk.  Decoding Lab Results  Clinical staff conducted group or individual video education with verbal and written material and guidebook.  Patient learns that lab test reflects one measurement whose values change over time and are influenced by many factors, including medication, stress, sleep, exercise, food, hydration, pre-existing medical conditions, and more. It is recommended to use the knowledge from this video to become more involved with your lab results and evaluate your numbers to speak with your doctor.   Diseases of Our Time - Overview  Clinical staff conducted group or individual video education with verbal and written material and guidebook.  Patient  learns that according to the CDC, 50% to 70% of chronic diseases (such as obesity, type 2 diabetes, elevated lipids, hypertension, and heart disease) are avoidable through lifestyle improvements including healthier food choices, listening to satiety cues, and increased physical activity.  Sleep Disorders Clinical staff conducted group or individual video education with verbal and written material and guidebook.  Patient learns how good quality and duration of sleep are important to overall health and well-being. Patient also learns about sleep disorders and how they impact health along with recommendations to address them, including discussing with a physician.  Nutrition  Dining Out - Part 2 Clinical staff conducted group or individual video education with verbal and written material and guidebook.  Patient learns how to plan ahead and  communicate in order to maximize their dining experience in a healthy and nutritious manner. Included are recommended food choices based on the type of restaurant the patient is visiting.   Fueling a Banker conducted group or individual video education with verbal and written material and guidebook.  There is a strong connection between our food choices and our health. Diseases like obesity and type 2 diabetes are very prevalent and are in large-part due to lifestyle choices. The Pritikin Eating Plan provides plenty of food and hunger-curbing satisfaction. It is easy to follow, affordable, and helps reduce health risks.  Menu Workshop  Clinical staff conducted group or individual video education with verbal and written material and guidebook.  Patient learns that restaurant meals can sabotage health goals because they are often packed with calories, fat, sodium, and sugar. Recommendations include strategies to plan ahead and to communicate with the manager, chef, or server to help order a healthier meal.  Planning Your Eating Strategy  Clinical staff conducted group or individual video education with verbal and written material and guidebook.  Patient learns about the Pritikin Eating Plan and its benefit of reducing the risk of disease. The Pritikin Eating Plan does not focus on calories. Instead, it emphasizes high-quality, nutrient-rich foods. By knowing the characteristics of the foods, we choose, we can determine their calorie density and make informed decisions.  Targeting Your Nutrition Priorities  Clinical staff conducted group or individual video education with verbal and written material and guidebook.  Patient learns that lifestyle habits have a tremendous impact on disease risk and progression. This video provides eating and physical activity recommendations based on your personal health goals, such as reducing LDL cholesterol, losing weight, preventing or controlling  type 2 diabetes, and reducing high blood pressure.  Vitamins and Minerals  Clinical staff conducted group or individual video education with verbal and written material and guidebook.  Patient learns different ways to obtain key vitamins and minerals, including through a recommended healthy diet. It is important to discuss all supplements you take with your doctor.   Healthy Mind-Set    Smoking Cessation  Clinical staff conducted group or individual video education with verbal and written material and guidebook.  Patient learns that cigarette smoking and tobacco addiction pose a serious health risk which affects millions of people. Stopping smoking will significantly reduce the risk of heart disease, lung disease, and many forms of cancer. Recommended strategies for quitting are covered, including working with your doctor to develop a successful plan.  Culinary   Becoming a Set designer conducted group or individual video education with verbal and written material and guidebook.  Patient learns that cooking at home can be healthy, cost-effective, quick, and puts  them in control. Keys to cooking healthy recipes will include looking at your recipe, assessing your equipment needs, planning ahead, making it simple, choosing cost-effective seasonal ingredients, and limiting the use of added fats, salts, and sugars.  Cooking - Breakfast and Snacks  Clinical staff conducted group or individual video education with verbal and written material and guidebook.  Patient learns how important breakfast is to satiety and nutrition through the entire day. Recommendations include key foods to eat during breakfast to help stabilize blood sugar levels and to prevent overeating at meals later in the day. Planning ahead is also a key component.  Cooking - Educational psychologist conducted group or individual video education with verbal and written material and guidebook.  Patient learns  eating strategies to improve overall health, including an approach to cook more at home. Recommendations include thinking of animal protein as a side on your plate rather than center stage and focusing instead on lower calorie dense options like vegetables, fruits, whole grains, and plant-based proteins, such as beans. Making sauces in large quantities to freeze for later and leaving the skin on your vegetables are also recommended to maximize your experience.  Cooking - Healthy Salads and Dressing Clinical staff conducted group or individual video education with verbal and written material and guidebook.  Patient learns that vegetables, fruits, whole grains, and legumes are the foundations of the Pritikin Eating Plan. Recommendations include how to incorporate each of these in flavorful and healthy salads, and how to create homemade salad dressings. Proper handling of ingredients is also covered. Cooking - Soups and [employer] - Soups and Desserts Clinical staff conducted group or individual video education with verbal and written material and guidebook.  Patient learns that Pritikin soups and desserts make for easy, nutritious, and delicious snacks and meal components that are low in sodium, fat, sugar, and calorie density, while high in vitamins, minerals, and filling fiber. Recommendations include simple and healthy ideas for soups and desserts.   Overview     The Pritikin Solution Program Overview Clinical staff conducted group or individual video education with verbal and written material and guidebook.  Patient learns that the results of the Pritikin Program have been documented in more than 100 articles published in peer-reviewed journals, and the benefits include reducing risk factors for (and, in some cases, even reversing) high cholesterol, high blood pressure, type 2 diabetes, obesity, and more! An overview of the three key pillars of the Pritikin Program will be covered: eating well,  doing regular exercise, and having a healthy mind-set.  WORKSHOPS  Exercise: Exercise Basics: Building Your Action Plan Clinical staff led group instruction and group discussion with PowerPoint presentation and patient guidebook. To enhance the learning environment the use of posters, models and videos may be added. At the conclusion of this workshop, patients will comprehend the difference between physical activity and exercise, as well as the benefits of incorporating both, into their routine. Patients will understand the FITT (Frequency, Intensity, Time, and Type) principle and how to use it to build an exercise action plan. In addition, safety concerns and other considerations for exercise and cardiac rehab will be addressed by the presenter. The purpose of this lesson is to promote a comprehensive and effective weekly exercise routine in order to improve patients' overall level of fitness.   Managing Heart Disease: Your Path to a Healthier Heart Clinical staff led group instruction and group discussion with PowerPoint presentation and patient guidebook. To enhance the learning  environment the use of posters, models and videos may be added.At the conclusion of this workshop, patients will understand the anatomy and physiology of the heart. Additionally, they will understand how Pritikin's three pillars impact the risk factors, the progression, and the management of heart disease.  The purpose of this lesson is to provide a high-level overview of the heart, heart disease, and how the Pritikin lifestyle positively impacts risk factors.  Exercise Biomechanics Clinical staff led group instruction and group discussion with PowerPoint presentation and patient guidebook. To enhance the learning environment the use of posters, models and videos may be added. Patients will learn how the structural parts of their bodies function and how these functions impact their daily activities, movement, and  exercise. Patients will learn how to promote a neutral spine, learn how to manage pain, and identify ways to improve their physical movement in order to promote healthy living. The purpose of this lesson is to expose patients to common physical limitations that impact physical activity. Participants will learn practical ways to adapt and manage aches and pains, and to minimize their effect on regular exercise. Patients will learn how to maintain good posture while sitting, walking, and lifting.  Balance Training and Fall Prevention  Clinical staff led group instruction and group discussion with PowerPoint presentation and patient guidebook. To enhance the learning environment the use of posters, models and videos may be added. At the conclusion of this workshop, patients will understand the importance of their sensorimotor skills (vision, proprioception, and the vestibular system) in maintaining their ability to balance as they age. Patients will apply a variety of balancing exercises that are appropriate for their current level of function. Patients will understand the common causes for poor balance, possible solutions to these problems, and ways to modify their physical environment in order to minimize their fall risk. The purpose of this lesson is to teach patients about the importance of maintaining balance as they age and ways to minimize their risk of falling.  WORKSHOPS   Nutrition:  Fueling a Ship broker led group instruction and group discussion with PowerPoint presentation and patient guidebook. To enhance the learning environment the use of posters, models and videos may be added. Patients will review the foundational principles of the Pritikin Eating Plan and understand what constitutes a serving size in each of the food groups. Patients will also learn Pritikin-friendly foods that are better choices when away from home and review make-ahead meal and snack options.  Calorie density will be reviewed and applied to three nutrition priorities: weight maintenance, weight loss, and weight gain. The purpose of this lesson is to reinforce (in a group setting) the key concepts around what patients are recommended to eat and how to apply these guidelines when away from home by planning and selecting Pritikin-friendly options. Patients will understand how calorie density may be adjusted for different weight management goals.  Mindful Eating  Clinical staff led group instruction and group discussion with PowerPoint presentation and patient guidebook. To enhance the learning environment the use of posters, models and videos may be added. Patients will briefly review the concepts of the Pritikin Eating Plan and the importance of low-calorie dense foods. The concept of mindful eating will be introduced as well as the importance of paying attention to internal hunger signals. Triggers for non-hunger eating and techniques for dealing with triggers will be explored. The purpose of this lesson is to provide patients with the opportunity to review the basic principles of the  Pritikin Eating Plan, discuss the value of eating mindfully and how to measure internal cues of hunger and fullness using the Hunger Scale. Patients will also discuss reasons for non-hunger eating and learn strategies to use for controlling emotional eating.  Targeting Your Nutrition Priorities Clinical staff led group instruction and group discussion with PowerPoint presentation and patient guidebook. To enhance the learning environment the use of posters, models and videos may be added. Patients will learn how to determine their genetic susceptibility to disease by reviewing their family history. Patients will gain insight into the importance of diet as part of an overall healthy lifestyle in mitigating the impact of genetics and other environmental insults. The purpose of this lesson is to provide patients with the  opportunity to assess their personal nutrition priorities by looking at their family history, their own health history and current risk factors. Patients will also be able to discuss ways of prioritizing and modifying the Pritikin Eating Plan for their highest risk areas  Menu  Clinical staff led group instruction and group discussion with PowerPoint presentation and patient guidebook. To enhance the learning environment the use of posters, models and videos may be added. Using menus brought in from [employer], or printed from [employer], patients will apply the Pritikin dining out guidelines that were presented in the [employer] video. Patients will also be able to practice these guidelines in a variety of provided scenarios. The purpose of this lesson is to provide patients with the opportunity to practice hands-on learning of the Pritikin Dining Out guidelines with actual menus and practice scenarios.  Label Reading Clinical staff led group instruction and group discussion with PowerPoint presentation and patient guidebook. To enhance the learning environment the use of posters, models and videos may be added. Patients will review and discuss the Pritikin label reading guidelines presented in [employer]'s Label Reading Educational series video. Using fool labels brought in from local grocery stores and markets, patients will apply the label reading guidelines and determine if the packaged food meet the Pritikin guidelines. The purpose of this lesson is to provide patients with the opportunity to review, discuss, and practice hands-on learning of the Pritikin Label Reading guidelines with actual packaged food labels. Cooking School  [employer]'s [employer] are designed to teach patients ways to prepare quick, simple, and affordable recipes at home. The importance of nutrition's role in chronic disease risk reduction is reflected in its emphasis in the overall  Pritikin program. By learning how to prepare essential core Pritikin Eating Plan recipes, patients will increase control over what they eat; be able to customize the flavor of foods without the use of added salt, sugar, or fat; and improve the quality of the food they consume. By learning a set of core recipes which are easily assembled, quickly prepared, and affordable, patients are more likely to prepare more healthy foods at home. These workshops focus on convenient breakfasts, simple entres, side dishes, and desserts which can be prepared with minimal effort and are consistent with nutrition recommendations for cardiovascular risk reduction. Cooking [employer] are taught by a Armed forces logistics/support/administrative officer (RD) who has been trained by the [employer]. The chef or RD has a clear understanding of the importance of minimizing - if not completely eliminating - added fat, sugar, and sodium in recipes. Throughout the series of Cooking School Workshop sessions, patients will learn about healthy ingredients and efficient methods of cooking to build confidence in their capability to  prepare    Cooking School weekly topics:  Adding Flavor- Sodium-Free  Fast and Healthy Breakfasts  Powerhouse Plant-Based Proteins  Satisfying Salads and Dressings  Simple Sides and Sauces  International Cuisine-Spotlight on the Blue Zones  Delicious Desserts  Savory Soups  Efficiency Cooking - Meals in a Snap  Tasty Appetizers and Snacks  Comforting Weekend Breakfasts  One-Pot Wonders   Fast Evening Meals  Landscape architect Your Pritikin Plate  WORKSHOPS   Healthy Mindset (Psychosocial):  Focused Goals, Sustainable Changes Clinical staff led group instruction and group discussion with PowerPoint presentation and patient guidebook. To enhance the learning environment the use of posters, models and videos may be added. Patients will be able to apply effective goal setting strategies  to establish at least one personal goal, and then take consistent, meaningful action toward that goal. They will learn to identify common barriers to achieving personal goals and develop strategies to overcome them. Patients will also gain an understanding of how our mind-set can impact our ability to achieve goals and the importance of cultivating a positive and growth-oriented mind-set. The purpose of this lesson is to provide patients with a deeper understanding of how to set and achieve personal goals, as well as the tools and strategies needed to overcome common obstacles which may arise along the way.  From Head to Heart: The Power of a Healthy Outlook  Clinical staff led group instruction and group discussion with PowerPoint presentation and patient guidebook. To enhance the learning environment the use of posters, models and videos may be added. Patients will be able to recognize and describe the impact of emotions and mood on physical health. They will discover the importance of self-care and explore self-care practices which may work for them. Patients will also learn how to utilize the 4 C's to cultivate a healthier outlook and better manage stress and challenges. The purpose of this lesson is to demonstrate to patients how a healthy outlook is an essential part of maintaining good health, especially as they continue their cardiac rehab journey.  Healthy Sleep for a Healthy Heart Clinical staff led group instruction and group discussion with PowerPoint presentation and patient guidebook. To enhance the learning environment the use of posters, models and videos may be added. At the conclusion of this workshop, patients will be able to demonstrate knowledge of the importance of sleep to overall health, well-being, and quality of life. They will understand the symptoms of, and treatments for, common sleep disorders. Patients will also be able to identify daytime and nighttime behaviors which impact  sleep, and they will be able to apply these tools to help manage sleep-related challenges. The purpose of this lesson is to provide patients with a general overview of sleep and outline the importance of quality sleep. Patients will learn about a few of the most common sleep disorders. Patients will also be introduced to the concept of "sleep hygiene," and discover ways to self-manage certain sleeping problems through simple daily behavior changes. Finally, the workshop will motivate patients by clarifying the links between quality sleep and their goals of heart-healthy living.   Recognizing and Reducing Stress Clinical staff led group instruction and group discussion with PowerPoint presentation and patient guidebook. To enhance the learning environment the use of posters, models and videos may be added. At the conclusion of this workshop, patients will be able to understand the types of stress reactions, differentiate between acute and chronic stress, and recognize the impact that chronic stress has on  their health. They will also be able to apply different coping mechanisms, such as reframing negative self-talk. Patients will have the opportunity to practice a variety of stress management techniques, such as deep abdominal breathing, progressive muscle relaxation, and/or guided imagery.  The purpose of this lesson is to educate patients on the role of stress in their lives and to provide healthy techniques for coping with it.  Learning Barriers/Preferences:  Learning Barriers/Preferences - 11/20/23 1417       Learning Barriers/Preferences   Learning Barriers Sight;Exercise Concerns   poor balance, falls, pt wears reading glasses   Learning Preferences Audio;Computer/Internet;Group Instruction;Individual Instruction;Pictoral;Skilled Demonstration;Verbal Instruction;Video;Written Material             Education Topics:  Knowledge Questionnaire Score:  Knowledge Questionnaire Score - 11/20/23  1417       Knowledge Questionnaire Score   Pre Score 21/24             Core Components/Risk Factors/Patient Goals at Admission:  Personal Goals and Risk Factors at Admission - 11/20/23 1420       Core Components/Risk Factors/Patient Goals on Admission    Weight Management Yes;Weight Loss    Intervention Weight Management: Develop a combined nutrition and exercise program designed to reach desired caloric intake, while maintaining appropriate intake of nutrient and fiber, sodium and fats, and appropriate energy expenditure required for the weight goal.;Weight Management: Provide education and appropriate resources to help participant work on and attain dietary goals.;Weight Management/Obesity: Establish reasonable short term and long term weight goals.;Obesity: Provide education and appropriate resources to help participant work on and attain dietary goals.    Diabetes Yes    Intervention Provide education about signs/symptoms and action to take for hypo/hyperglycemia.;Provide education about proper nutrition, including hydration, and aerobic/resistive exercise prescription along with prescribed medications to achieve blood glucose in normal ranges: Fasting glucose 65-99 mg/dL    Expected Outcomes Short Term: Participant verbalizes understanding of the signs/symptoms and immediate care of hyper/hypoglycemia, proper foot care and importance of medication, aerobic/resistive exercise and nutrition plan for blood glucose control.;Long Term: Attainment of HbA1C < 7%.    Heart Failure Yes    Intervention Provide a combined exercise and nutrition program that is supplemented with education, support and counseling about heart failure. Directed toward relieving symptoms such as shortness of breath, decreased exercise tolerance, and extremity edema.    Expected Outcomes Improve functional capacity of life;Short term: Attendance in program 2-3 days a week with increased exercise capacity. Reported lower  sodium intake. Reported increased fruit and vegetable intake. Reports medication compliance.;Short term: Daily weights obtained and reported for increase. Utilizing diuretic protocols set by physician.;Long term: Adoption of self-care skills and reduction of barriers for early signs and symptoms recognition and intervention leading to self-care maintenance.    Hypertension Yes    Intervention Provide education on lifestyle modifcations including regular physical activity/exercise, weight management, moderate sodium restriction and increased consumption of fresh fruit, vegetables, and low fat dairy, alcohol moderation, and smoking cessation.;Monitor prescription use compliance.    Expected Outcomes Short Term: Continued assessment and intervention until BP is < 140/18mm HG in hypertensive participants. < 130/81mm HG in hypertensive participants with diabetes, heart failure or chronic kidney disease.;Long Term: Maintenance of blood pressure at goal levels.    Lipids Yes    Intervention Provide education and support for participant on nutrition & aerobic/resistive exercise along with prescribed medications to achieve LDL 70mg , HDL >40mg .    Expected Outcomes Short Term: Participant states understanding of desired  cholesterol values and is compliant with medications prescribed. Participant is following exercise prescription and nutrition guidelines.;Long Term: Cholesterol controlled with medications as prescribed, with individualized exercise RX and with personalized nutrition plan. Value goals: LDL < 70mg , HDL > 40 mg.    Personal Goal Other Yes    Personal Goal Pt would like more flexibility, and more stabole when walking, increase in endurance    Intervention Will continue to monitor pt and progress workloads as tolerated without sign or symptom    Expected Outcomes Pt will achieve her goals and increase strength             Core Components/Risk Factors/Patient Goals Review:   Goals and Risk  Factor Review     Row Name 12/04/23 1125             Core Components/Risk Factors/Patient Goals Review   Personal Goals Review Weight Management/Obesity;Heart Failure;Hypertension;Lipids;Diabetes       Review Amel started cardiac rehab on 11/24/23. Rether is off to a good start to exercise. Vital sings and CBG's have been stable.       Expected Outcomes Lorell will continue to participate in cardiac rehab for exercise, nutrition and lifestyle modifications                Core Components/Risk Factors/Patient Goals at Discharge (Final Review):   Goals and Risk Factor Review - 12/04/23 1125       Core Components/Risk Factors/Patient Goals Review   Personal Goals Review Weight Management/Obesity;Heart Failure;Hypertension;Lipids;Diabetes    Review Stephanny started cardiac rehab on 11/24/23. Temprance is off to a good start to exercise. Vital sings and CBG's have been stable.    Expected Outcomes Murline will continue to participate in cardiac rehab for exercise, nutrition and lifestyle modifications             ITP Comments:  ITP Comments     Row Name 11/20/23 0838 12/04/23 1123         ITP Comments Dr. Gaylyn Keas medical director. Introduction to pritikin education/intensive cardiac rehab. Initial orientation packet reviewed with patient. 30 Day ITP Review. Rosio started cardiac rehab on 12/03/23. Alliene is off to a good start to exercise.               Comments: See ITP comment

## 2023-12-08 NOTE — Progress Notes (Signed)
 CARDIAC REHAB PHASE 2  Reviewed home exercise with pt today. Pt is tolerating exercise well. Pt will continue to exercise on her own by walking for 15-30 minutes per session 1-2 days a week in addition to the 3 days in CRP2. Advised pt on THRR, RPE scale, hydration and temperature/humidity precautions. Reinforced NTG use, S/S to stop exercise and when to call MD vs 911. Encouraged warm up cool down and stretches with exercise sessions. Pt verbalized understanding, all questions were answered and pt was given a copy to take home.    Darl Edu ACSM-CEP 12/08/2023 8:25 AM

## 2023-12-10 ENCOUNTER — Encounter (HOSPITAL_COMMUNITY)
Admission: RE | Admit: 2023-12-10 | Discharge: 2023-12-10 | Disposition: A | Discharge status: Home / Self Care | Source: Ambulatory Visit | Attending: Cardiology | Admitting: Cardiology

## 2023-12-10 DIAGNOSIS — I5042 Chronic combined systolic (congestive) and diastolic (congestive) heart failure: Secondary | ICD-10-CM | POA: Diagnosis not present

## 2023-12-12 ENCOUNTER — Encounter (HOSPITAL_COMMUNITY)
Admission: RE | Admit: 2023-12-12 | Discharge: 2023-12-12 | Disposition: A | Discharge status: Home / Self Care | Source: Ambulatory Visit | Attending: Cardiology | Admitting: Cardiology

## 2023-12-12 DIAGNOSIS — I5042 Chronic combined systolic (congestive) and diastolic (congestive) heart failure: Secondary | ICD-10-CM | POA: Diagnosis not present

## 2023-12-15 ENCOUNTER — Encounter (HOSPITAL_COMMUNITY)
Admission: RE | Admit: 2023-12-15 | Discharge: 2023-12-15 | Disposition: A | Discharge status: Home / Self Care | Source: Ambulatory Visit | Attending: Cardiology | Admitting: Cardiology

## 2023-12-15 DIAGNOSIS — I5042 Chronic combined systolic (congestive) and diastolic (congestive) heart failure: Secondary | ICD-10-CM

## 2023-12-17 ENCOUNTER — Encounter (HOSPITAL_COMMUNITY)
Admission: RE | Admit: 2023-12-17 | Discharge: 2023-12-17 | Disposition: A | Discharge status: Home / Self Care | Source: Ambulatory Visit | Attending: Cardiology | Admitting: Cardiology

## 2023-12-17 DIAGNOSIS — I5042 Chronic combined systolic (congestive) and diastolic (congestive) heart failure: Secondary | ICD-10-CM

## 2023-12-19 ENCOUNTER — Encounter (HOSPITAL_COMMUNITY)
Admission: RE | Admit: 2023-12-19 | Discharge: 2023-12-19 | Disposition: A | Discharge status: Home / Self Care | Source: Ambulatory Visit | Attending: Cardiology | Admitting: Cardiology

## 2023-12-19 DIAGNOSIS — I5042 Chronic combined systolic (congestive) and diastolic (congestive) heart failure: Secondary | ICD-10-CM | POA: Insufficient documentation

## 2023-12-22 ENCOUNTER — Encounter (HOSPITAL_COMMUNITY)
Admission: RE | Admit: 2023-12-22 | Discharge: 2023-12-22 | Disposition: A | Discharge status: Home / Self Care | Source: Ambulatory Visit | Attending: Cardiology | Admitting: Cardiology

## 2023-12-22 DIAGNOSIS — I5042 Chronic combined systolic (congestive) and diastolic (congestive) heart failure: Secondary | ICD-10-CM

## 2023-12-24 ENCOUNTER — Encounter (HOSPITAL_COMMUNITY)
Admission: RE | Admit: 2023-12-24 | Discharge: 2023-12-24 | Disposition: A | Discharge status: Home / Self Care | Source: Ambulatory Visit | Attending: Cardiology

## 2023-12-24 DIAGNOSIS — I5042 Chronic combined systolic (congestive) and diastolic (congestive) heart failure: Secondary | ICD-10-CM | POA: Diagnosis not present

## 2023-12-26 ENCOUNTER — Encounter (HOSPITAL_COMMUNITY)
Admission: RE | Admit: 2023-12-26 | Discharge: 2023-12-26 | Disposition: A | Discharge status: Home / Self Care | Source: Ambulatory Visit | Attending: Cardiology | Admitting: Cardiology

## 2023-12-26 DIAGNOSIS — I5042 Chronic combined systolic (congestive) and diastolic (congestive) heart failure: Secondary | ICD-10-CM | POA: Diagnosis not present

## 2023-12-29 ENCOUNTER — Encounter (HOSPITAL_COMMUNITY)
Admission: RE | Admit: 2023-12-29 | Discharge: 2023-12-29 | Disposition: A | Discharge status: Home / Self Care | Source: Ambulatory Visit | Attending: Cardiology | Admitting: Cardiology

## 2023-12-29 DIAGNOSIS — I5042 Chronic combined systolic (congestive) and diastolic (congestive) heart failure: Secondary | ICD-10-CM

## 2023-12-30 ENCOUNTER — Ambulatory Visit (INDEPENDENT_AMBULATORY_CARE_PROVIDER_SITE_OTHER): Payer: 59

## 2023-12-30 DIAGNOSIS — I639 Cerebral infarction, unspecified: Secondary | ICD-10-CM | POA: Diagnosis not present

## 2023-12-31 ENCOUNTER — Encounter (HOSPITAL_COMMUNITY)
Admission: RE | Admit: 2023-12-31 | Discharge: 2023-12-31 | Disposition: A | Discharge status: Home / Self Care | Source: Ambulatory Visit | Attending: Cardiology | Admitting: Cardiology

## 2023-12-31 DIAGNOSIS — I5042 Chronic combined systolic (congestive) and diastolic (congestive) heart failure: Secondary | ICD-10-CM | POA: Diagnosis not present

## 2023-12-31 LAB — CUP PACEART REMOTE DEVICE CHECK
Battery Remaining Longevity: 156 mo
Battery Remaining Percentage: 100 %
Brady Statistic RA Percent Paced: 2 %
Brady Statistic RV Percent Paced: 0 %
Date Time Interrogation Session: 20250513001100
HighPow Impedance: 80 Ohm
Implantable Lead Connection Status: 753985
Implantable Lead Connection Status: 753985
Implantable Lead Implant Date: 20241111
Implantable Lead Implant Date: 20241111
Implantable Lead Location: 753859
Implantable Lead Location: 753860
Implantable Lead Model: 672
Implantable Lead Model: 7841
Implantable Lead Serial Number: 1512046
Implantable Lead Serial Number: 262212
Implantable Pulse Generator Implant Date: 20241111
Lead Channel Impedance Value: 539 Ohm
Lead Channel Impedance Value: 707 Ohm
Lead Channel Pacing Threshold Amplitude: 0.6 V
Lead Channel Pacing Threshold Pulse Width: 0.4 ms
Lead Channel Setting Pacing Amplitude: 2 V
Lead Channel Setting Pacing Amplitude: 2.5 V
Lead Channel Setting Pacing Pulse Width: 0.4 ms
Lead Channel Setting Sensing Sensitivity: 0.6 mV
Pulse Gen Serial Number: 685817
Zone Setting Status: 755011

## 2024-01-02 ENCOUNTER — Encounter (HOSPITAL_COMMUNITY)
Admission: RE | Admit: 2024-01-02 | Discharge: 2024-01-02 | Disposition: A | Discharge status: Home / Self Care | Source: Ambulatory Visit | Attending: Cardiology | Admitting: Cardiology

## 2024-01-02 DIAGNOSIS — I5042 Chronic combined systolic (congestive) and diastolic (congestive) heart failure: Secondary | ICD-10-CM | POA: Diagnosis not present

## 2024-01-04 ENCOUNTER — Ambulatory Visit: Payer: Self-pay | Admitting: Cardiology

## 2024-01-05 ENCOUNTER — Encounter (HOSPITAL_COMMUNITY)
Admission: RE | Admit: 2024-01-05 | Discharge: 2024-01-05 | Disposition: A | Discharge status: Home / Self Care | Source: Ambulatory Visit | Attending: Cardiology | Admitting: Cardiology

## 2024-01-05 ENCOUNTER — Encounter (HOSPITAL_COMMUNITY)

## 2024-01-05 DIAGNOSIS — I5042 Chronic combined systolic (congestive) and diastolic (congestive) heart failure: Secondary | ICD-10-CM | POA: Diagnosis not present

## 2024-01-05 NOTE — Progress Notes (Signed)
 Cardiac Individual Treatment Plan  Patient Details  Name: Paula Wong MRN: [id number] Date of Birth: [birth year] Referring Provider:   Flowsheet Row INTENSIVE CARDIAC REHAB ORIENT from 11/20/2023 in [facility]  Referring Provider Dr. Alwin Baars DO       Initial Encounter Date:  Flowsheet Row INTENSIVE CARDIAC REHAB ORIENT from 11/20/2023 in [facility]  Date 11/20/23       Visit Diagnosis: Heart failure, systolic and diastolic, chronic (HCC)  Patient's Home Medications on Admission:  Current Outpatient Medications:    acetaminophen  (TYLENOL ) 325 MG tablet, Take 2 tablets (650 mg total) by mouth every 4 (four) hours as needed for headache or mild pain (pain score 1-3)., Disp: , Rfl:    amiodarone  (PACERONE ) 200 MG tablet, Take 200 mg by mouth daily., Disp: , Rfl:    aspirin  81 MG chewable tablet, Chew 1 tablet (81 mg total) by mouth daily., Disp: 90 tablet, Rfl: 3   atorvastatin  (LIPITOR ) 80 MG tablet, Take 80 mg by mouth at bedtime., Disp: , Rfl:    B-D ULTRAFINE III SHORT PEN 31G X 8 MM MISC, Inject into the skin daily., Disp: , Rfl:    carvedilol  (COREG ) 25 MG tablet, Take 1 tablet (25 mg total) by mouth 2 (two) times daily. (Patient taking differently: Take 12.5 mg by mouth 2 (two) times daily. 11/20/23 Dose decreased to 12.5 mg twice a day by Morey Ar NP. MWW RN), Disp: 60 tablet, Rfl: 11   clopidogrel  (PLAVIX ) 75 MG tablet, Take 1 tablet (75 mg total) by mouth daily., Disp: 30 tablet, Rfl: 11   FARXIGA  10 MG TABS tablet, Take 10 mg by mouth daily., Disp: , Rfl:    furosemide  (LASIX ) 20 MG tablet, Take 1 tablet (20 mg total) by mouth daily. May take an additional tablet as needed for swelling/SOB, Disp: 100 tablet, Rfl: 3   gabapentin  (NEURONTIN ) 600 MG tablet, Take 600 mg by mouth 2 (two) times daily., Disp: , Rfl:    magnesium  oxide (MAG-OX) 400  MG tablet, Take 400 mg by mouth daily., Disp: , Rfl:    metFORMIN  (GLUCOPHAGE -XR) 500 MG 24 hr tablet, Take 500 mg by mouth daily with breakfast., Disp: , Rfl:    nitroGLYCERIN  (NITROSTAT ) 0.4 MG SL tablet, Place 1 tablet (0.4 mg total) under the tongue every 5 (five) minutes x 3 doses as needed for chest pain., Disp: 25 tablet, Rfl: 2   ONETOUCH ULTRA test strip, USE TO CHECK BLOOD SUGARS ONCE DAILY AND AS NEEDED, Disp: , Rfl:    OZEMPIC, 1 MG/DOSE, 4 MG/3ML SOPN, Inject 1 mg into the skin once a week., Disp: , Rfl:    pantoprazole  (PROTONIX ) 20 MG tablet, Take 1 tablet by mouth once daily, Disp: 90 tablet, Rfl: 0   sacubitril -valsartan  (ENTRESTO ) 97-103 MG, Take 1 tablet by mouth 2 (two) times daily., Disp: 60 tablet, Rfl: 5   spironolactone  (ALDACTONE ) 25 MG tablet, Take 1 tablet (25 mg total) by mouth daily., Disp: 90 tablet, Rfl: 3   TRESIBA FLEXTOUCH 200 UNIT/ML SOPN, Take 80 Units by mouth at bedtime. , Disp: , Rfl:   Past Medical History: Past Medical History:  Diagnosis Date   Allergy    CAD (coronary artery disease)    a. 03/2022 Cath: LM 40ost, 68m, LAD 40ost/p, 83m, 85m, LCX 51m, RCA nl, RV branch 70-->Med rx.   Chronic HFrEF (heart failure with reduced ejection  fraction) (HCC)    a. 11/2021 Echo: EF 20-25%; b. 03/2022 Echo: EF 20-25%; c. 11/2022 Echo: EF 25-30%, glob HK.   Diabetes (HCC)    Diverticulosis    GERD (gastroesophageal reflux disease)    Hiatal hernia    Hyperlipidemia    Hypertension    Implantable loop recorder present    a. 03/2022 s/p Abbott IQ EL+ (serial [id number]) ILR in setting of cryptogenic stroke.   Ischemic cardiomyopathy    a. 11/2021 Echo: EF 20-25%; b. 03/2022 Echo: EF 20-25%; c. 11/2022 Echo: EF 25-30%, glob HK, nl RV fxn, mild-mod MR, mild AI, AoV sclerosis.   Neuropathy    feet   Schatzki's ring    Stroke [facility])    a. 03/2022 MRI Brain: subacute cortical infarct involving inf and med L occipial pole w/ petechial hemorrhage; b. 03/2022 s/p Abbott Assert  IQ EL ILR.   Ventricular tachycardia (HCC)    a. 12/2022 noted on ILR.    Tobacco Use: Social History   Tobacco Use  Smoking Status Former   Current packs/day: 0.00   Types: Cigarettes   Quit date: 08/20/2003   Years since quitting: 20.3  Smokeless Tobacco Never    Labs: Review Flowsheet  More data exists      Latest Ref Rng & Units 10/02/2020 04/13/2022 12/31/2022 03/06/2023 05/09/2023  Labs for ITP Cardiac and Pulmonary Rehab  Cholestrol 0 - 200 mg/dL 409  811  914  - 782   LDL (calc) 0 - 99 mg/dL 90  70  41  - 60   HDL-C >40 mg/dL 41  35  39  - 43   Trlycerides <150 mg/dL 956  82  213  - 086   Hemoglobin A1c 4.8 - 5.6 % - 6.3  - 6.2  -    Capillary Blood Glucose: Lab Results  Component Value Date   GLUCAP 110 (H) 11/28/2023   GLUCAP 125 (H) 11/28/2023   GLUCAP 134 (H) 11/26/2023   GLUCAP 111 (H) 11/26/2023   GLUCAP 92 11/26/2023     Exercise Target Goals: Exercise Program Goal: Individual exercise prescription set using results from initial 6 min walk test and THRR while considering  patient's activity barriers and safety.   Exercise Prescription Goal: Initial exercise prescription builds to 30-45 minutes a day of aerobic activity, 2-3 days per week.  Home exercise guidelines will be given to patient during program as part of exercise prescription that the participant will acknowledge.  Activity Barriers & Risk Stratification:  Activity Barriers & Cardiac Risk Stratification - 11/20/23 0832       Activity Barriers & Cardiac Risk Stratification   Activity Barriers Deconditioning;Assistive Device;Decreased Ventricular Function;Balance Concerns;History of Falls    Cardiac Risk Stratification High             6 Minute Walk:  6 Minute Walk     Row Name 11/20/23 1356         6 Minute Walk   Phase Initial     Distance 750 feet     Walk Time 6 minutes     # of Rest Breaks 1  seated rest break for fatigue. from 3.00-1.19     MPH 1.42     METS 1.02     RPE  11     Perceived Dyspnea  0     VO2 Peak 3.56     Symptoms Yes (comment)     Comments Asymptomatic: low BP post walk at 82/57, H2o recheck seated 94/60  and standing: 104/62     Resting HR 81 bpm     Resting BP 108/62     Resting Oxygen Saturation  94 %     Exercise Oxygen Saturation  during 6 min walk 98 %     Max Ex. HR 106 bpm     Max Ex. BP 110/64     2 Minute Post BP 82/57  recheck 94/60 both asymptomatic              Oxygen Initial Assessment:   Oxygen Re-Evaluation:   Oxygen Discharge (Final Oxygen Re-Evaluation):   Initial Exercise Prescription:  Initial Exercise Prescription - 11/20/23 1400       Date of Initial Exercise RX and Referring Provider   Date 11/20/23    Referring Provider Dr. Alwin Baars DO    Expected Discharge Date 02/11/24      NuStep   Level 1    SPM 60    Minutes 15    METs 1.2      Prescription Details   Frequency (times per week) 3    Duration Progress to 30 minutes of continuous aerobic without signs/symptoms of physical distress      Intensity   THRR 40-80% of Max Heartrate 57-114    Ratings of Perceived Exertion 11-13    Perceived Dyspnea 0-4      Progression   Progression Continue progressive overload as per policy without signs/symptoms or physical distress.      Resistance Training   Training Prescription Yes    Weight 2    Reps 10-15             Perform Capillary Blood Glucose checks as needed.  Exercise Prescription Changes:   Exercise Prescription Changes     Row Name 11/24/23 0823 12/08/23 0820 12/19/23 0828 01/05/24 0818       Response to Exercise   Blood Pressure (Admit) 102/66 104/64 98/62 116/62    Blood Pressure (Exercise) 122/70 138/62 -- --    Blood Pressure (Exit) 102/70 106/58 102/60 110/60    Heart Rate (Admit) 72 bpm 85 bpm 76 bpm 84 bpm    Heart Rate (Exercise) 99 bpm 99 bpm 93 bpm 104 bpm    Heart Rate (Exit) 65 bpm 86 bpm 83 bpm 85 bpm    Rating of Perceived Exertion (Exercise) 11  9 11 13     Perceived Dyspnea (Exercise) 0 0 0 0    Symptoms none none none none    Comments Reviewed MET's, goals and home ExRx Reviewed MET's, goals and home ExRx REVD MET's REVD MET's and goals    Duration Continue with 30 min of aerobic exercise without signs/symptoms of physical distress. Continue with 30 min of aerobic exercise without signs/symptoms of physical distress. Continue with 30 min of aerobic exercise without signs/symptoms of physical distress. Continue with 30 min of aerobic exercise without signs/symptoms of physical distress.    Intensity THRR unchanged THRR unchanged THRR unchanged THRR unchanged      Progression   Progression Continue to progress workloads to maintain intensity without signs/symptoms of physical distress. Continue to progress workloads to maintain intensity without signs/symptoms of physical distress. Continue to progress workloads to maintain intensity without signs/symptoms of physical distress. Continue to progress workloads to maintain intensity without signs/symptoms of physical distress.    Average METs 1.9 2 2.4 2.18      Resistance Training   Training Prescription Yes Yes Yes Yes    Weight 2 2 3  3  Reps 10-15 10-15 10-15 10-15    Time 10 Minutes 10 Minutes 10 Minutes 10 Minutes      NuStep   Level 1 1 3 3     SPM 72 72 93 93    Minutes 15 15 15 15     METs 1.9 1.9 2.4 2.4      Track   Laps -- -- -- 5    Minutes -- -- -- 10    METs -- -- -- 1.96      Home Exercise Plan   Plans to continue exercise at -- Home (comment) Home (comment) Home (comment)    Frequency -- Add 2 additional days to program exercise sessions. Add 2 additional days to program exercise sessions. Add 2 additional days to program exercise sessions.    Initial Home Exercises Provided -- 12/08/23 12/08/23 12/08/23             Exercise Comments:   Exercise Comments     Row Name 11/24/23 0836 12/08/23 0824 12/19/23 0830 01/05/24 0821     Exercise Comments Pt  first day in the [facility] program. Pt tolerated exercise well with an average MET level of 1.9. Pt is learning her THRR, RPE and ExRx. Reviewed MET's, goals and home ExRx. Pt tolerated exercise well with an average MET level of 2.0. Pt is doing well and is feeling an increase in strength and stamina. She says shes feeling good and is going to start walking on her own. She will start for 15-30 mins 1.2 days. Reviewed MET's. Pt tolerated exercise well with an average MET level of 2.4. Pt is doing well and is feeling an increase in strength and stamina. Encouraged increase SPM to INC MET's, she did great today Reviewed MET's, goals and home ExRx. Pt tolerated exercise well with an average MET level of 2.18. Pt is doing good with her goals and is feeling an increase in strength and stamina. She wanted to be able to walk more and feel better with walking. so added track for 10 mins as second station, will continue to progress to 15 mins as she's ready             Exercise Goals and Review:   Exercise Goals     Row Name 11/20/23 0833             Exercise Goals   Increase Physical Activity Yes       Intervention Provide advice, education, support and counseling about physical activity/exercise needs.;Develop an individualized exercise prescription for aerobic and resistive training based on initial evaluation findings, risk stratification, comorbidities and participant's personal goals.       Expected Outcomes Short Term: Attend rehab on a regular basis to increase amount of physical activity.;Long Term: Exercising regularly at least 3-5 days a week.;Long Term: Add in home exercise to make exercise part of routine and to increase amount of physical activity.       Increase Strength and Stamina Yes       Intervention Develop an individualized exercise prescription for aerobic and resistive training based on initial evaluation findings, risk stratification, comorbidities and participant's personal  goals.;Provide advice, education, support and counseling about physical activity/exercise needs.       Expected Outcomes Short Term: Increase workloads from initial exercise prescription for resistance, speed, and METs.;Short Term: Perform resistance training exercises routinely during rehab and add in resistance training at home;Long Term: Improve cardiorespiratory fitness, muscular endurance and strength as measured by increased METs and functional  capacity ( )       Able to understand and use rate of perceived exertion (RPE) scale Yes       Intervention Provide education and explanation on how to use RPE scale       Expected Outcomes Short Term: Able to use RPE daily in rehab to express subjective intensity level;Long Term:  Able to use RPE to guide intensity level when exercising independently       Knowledge and understanding of Target Heart Rate Range (THRR) Yes       Intervention Provide education and explanation of THRR including how the numbers were predicted and where they are located for reference       Expected Outcomes Short Term: Able to state/look up THRR;Long Term: Able to use THRR to govern intensity when exercising independently;Short Term: Able to use daily as guideline for intensity in rehab       Understanding of Exercise Prescription Yes       Intervention Provide education, explanation, and written materials on patient's individual exercise prescription       Expected Outcomes Short Term: Able to explain program exercise prescription;Long Term: Able to explain home exercise prescription to exercise independently                Exercise Goals Re-Evaluation :  Exercise Goals Re-Evaluation     Row Name 11/24/23 0827 12/08/23 0821 01/05/24 0819         Exercise Goal Re-Evaluation   Exercise Goals Review Increase Physical Activity;Understanding of Exercise Prescription;Increase Strength and Stamina;Knowledge and understanding of Target Heart Rate Range (THRR);Able to  understand and use rate of perceived exertion (RPE) scale Increase Physical Activity;Understanding of Exercise Prescription;Increase Strength and Stamina;Knowledge and understanding of Target Heart Rate Range (THRR);Able to understand and use rate of perceived exertion (RPE) scale Increase Physical Activity;Understanding of Exercise Prescription;Increase Strength and Stamina;Knowledge and understanding of Target Heart Rate Range (THRR);Able to understand and use rate of perceived exertion (RPE) scale     Comments Pt first day in the [facility] program. Pt tolerated exercise well with an average MET level of 1.9. Pt is learning her THRR, RPE and ExRx Reviewed MET's, goals and home ExRx. Pt tolerated exercise well with an average MET level of 2.0. Pt is doing well and is feeling an increase in strength and stamina. She says shes feeling good and is going to start walking on her own. She will start for 15-30 mins 1.2 days. Reviewed MET's, goals and home ExRx. Pt tolerated exercise well with an average MET level of 2.18. Pt is doing good with her goals and is feeling an increase in strength and stamina. She wanted to be able to walk more and feel better with walking. so added track for 10 mins as second station, will continue to progress to 15 mins as she's ready     Expected Outcomes Will continue to monitor pt and progress workloads as tolerated without sign or symptom Will continue to monitor pt and progress workloads as tolerated without sign or symptom Will continue to monitor pt and progress workloads as tolerated without sign or symptom              Discharge Exercise Prescription (Final Exercise Prescription Changes):  Exercise Prescription Changes - 01/05/24 0818       Response to Exercise   Blood Pressure (Admit) 116/62    Blood Pressure (Exit) 110/60    Heart Rate (Admit) 84 bpm    Heart Rate (Exercise) 104  bpm    Heart Rate (Exit) 85 bpm    Rating of Perceived Exertion (Exercise) 13     Perceived Dyspnea (Exercise) 0    Symptoms none    Comments REVD MET's and goals    Duration Continue with 30 min of aerobic exercise without signs/symptoms of physical distress.    Intensity THRR unchanged      Progression   Progression Continue to progress workloads to maintain intensity without signs/symptoms of physical distress.    Average METs 2.18      Resistance Training   Training Prescription Yes    Weight 3    Reps 10-15    Time 10 Minutes      NuStep   Level 3    SPM 93    Minutes 15    METs 2.4      Track   Laps 5    Minutes 10    METs 1.96      Home Exercise Plan   Plans to continue exercise at Home (comment)    Frequency Add 2 additional days to program exercise sessions.    Initial Home Exercises Provided 12/08/23             Nutrition:  Target Goals: Understanding of nutrition guidelines, daily intake of sodium 1500mg , cholesterol 200mg , calories 30% from fat and 7% or less from saturated fats, daily to have 5 or more servings of fruits and vegetables.  Biometrics:  Pre Biometrics - 11/20/23 0959       Pre Biometrics   Waist Circumference 41.5 inches    Hip Circumference 43 inches    Waist to Hip Ratio 0.97 %    Triceps Skinfold 38 mm    % Body Fat 46.7 %    Grip Strength 25 kg    Flexibility --   unable to reach   Single Leg Stand 4 seconds              Nutrition Therapy Plan and Nutrition Goals:  Nutrition Therapy & Goals - 12/25/23 1147       Nutrition Therapy   Diet Heart Healthy Diet    Drug/Food Interactions Statins/Certain Fruits      Personal Nutrition Goals   Nutrition Goal Patient to identify strategies for reducing cardiovascular risk by attending the [facility] education and nutrition series weekly.   goal in progress   Personal Goal #2 Patient to improve diet quality by using the plate method as a guide for meal planning to include lean protein/plant protein, fruits, vegetables, whole grains, nonfat dairy as part of  a well-balanced diet.   goal in progress   Personal Goal #3 Patient to identify strategies for weight loss of 0.5-2.0# per week.   goal in progress   Comments Goal in progress. Patient has medical history of CVA, HTN, HFrEF, CAD, CKD2, DM2, hyperlipidemia. A1c is well controlled. Lipids are at goal. She is taking ozempic to aid with weight loss/blood sugar control. She is down 2.6# since starting with our program. She continues to attend the [facility] education and nutrition series regularly. Patient will benefit from participation in intensive cardiac rehab for nutrition, exercise, and lifestyle modification.      Intervention Plan   Intervention Prescribe, educate and counsel regarding individualized specific dietary modifications aiming towards targeted core components such as weight, hypertension, lipid management, diabetes, heart failure and other comorbidities.;Nutrition handout(s) given to patient.    Expected Outcomes Short Term Goal: Understand basic principles of dietary content, such as calories,  fat, sodium, cholesterol and nutrients.;Long Term Goal: Adherence to prescribed nutrition plan.             Nutrition Assessments:  Nutrition Assessments - 12/01/23 1429       Rate Your Plate Scores   Pre Score 47            MEDIFICTS Score Key: >=70 Need to make dietary changes  40-70 Heart Healthy Diet <= 40 Therapeutic Level Cholesterol Diet   Flowsheet Row INTENSIVE CARDIAC REHAB from 12/01/2023 in [facility]  Picture Your Plate Total Score on Admission 47      Picture Your Plate Scores: <69 Unhealthy dietary pattern with much room for improvement. 41-50 Dietary pattern unlikely to meet recommendations for good health and room for improvement. 51-60 More healthful dietary pattern, with some room for improvement.  >60 Healthy dietary pattern, although there may be some specific behaviors that could be improved.     Nutrition Goals Re-Evaluation:  Nutrition Goals Re-Evaluation     Row Name 11/24/23 1023 12/25/23 1147           Goals   Current Weight 191 lb 5.8 oz (86.8 kg) 189 lb 9.5 oz (86 kg)      Comment GFR 54, Cr 1,05, A1c 6.2, lipids WNL, LDL 60 no new labs; most recent labs GFR 54, Cr 1,05, A1c 6.2, lipids WNL, LDL 60      Expected Outcome Patient has medical history of CVA, HTN, HFrEF, CAD, CKD2, DM2, hyperlipidemia. A1c is well controlled. Lipids are at goal. She is taking ozempic to aid with weight loss/blood sugar control. Patient will benefit from participation in intensive cardiac rehab for nutrition, exercise, and lifestyle modification. Goal in progress. Patient has medical history of CVA, HTN, HFrEF, CAD, CKD2, DM2, hyperlipidemia. A1c is well controlled. Lipids are at goal. She is taking ozempic to aid with weight loss/blood sugar control. She is down 2.6# since starting with our program. She continues to attend the [facility] education and nutrition series regularly. Patient will benefit from participation in intensive cardiac rehab for nutrition, exercise, and lifestyle modification.               Nutrition Goals Re-Evaluation:  Nutrition Goals Re-Evaluation     Row Name 11/24/23 1023 12/25/23 1147           Goals   Current Weight 191 lb 5.8 oz (86.8 kg) 189 lb 9.5 oz (86 kg)      Comment GFR 54, Cr 1,05, A1c 6.2, lipids WNL, LDL 60 no new labs; most recent labs GFR 54, Cr 1,05, A1c 6.2, lipids WNL, LDL 60      Expected Outcome Patient has medical history of CVA, HTN, HFrEF, CAD, CKD2, DM2, hyperlipidemia. A1c is well controlled. Lipids are at goal. She is taking ozempic to aid with weight loss/blood sugar control. Patient will benefit from participation in intensive cardiac rehab for nutrition, exercise, and lifestyle modification. Goal in progress. Patient has medical history of CVA, HTN, HFrEF, CAD, CKD2, DM2, hyperlipidemia. A1c is well controlled. Lipids are at goal. She  is taking ozempic to aid with weight loss/blood sugar control. She is down 2.6# since starting with our program. She continues to attend the [facility] education and nutrition series regularly. Patient will benefit from participation in intensive cardiac rehab for nutrition, exercise, and lifestyle modification.               Nutrition Goals Discharge (Final Nutrition  Goals Re-Evaluation):  Nutrition Goals Re-Evaluation - 12/25/23 1147       Goals   Current Weight 189 lb 9.5 oz (86 kg)    Comment no new labs; most recent labs GFR 54, Cr 1,05, A1c 6.2, lipids WNL, LDL 60    Expected Outcome Goal in progress. Patient has medical history of CVA, HTN, HFrEF, CAD, CKD2, DM2, hyperlipidemia. A1c is well controlled. Lipids are at goal. She is taking ozempic to aid with weight loss/blood sugar control. She is down 2.6# since starting with our program. She continues to attend the [facility] education and nutrition series regularly. Patient will benefit from participation in intensive cardiac rehab for nutrition, exercise, and lifestyle modification.             Psychosocial: Target Goals: Acknowledge presence or absence of significant depression and/or stress, maximize coping skills, provide positive support system. Participant is able to verbalize types and ability to use techniques and skills needed for reducing stress and depression.  Initial Review & Psychosocial Screening:  Initial Psych Review & Screening - 11/20/23 0835       Initial Review   Current issues with None Identified      Family Dynamics   Good Support System? Yes   Her church family is her support     Barriers   Psychosocial barriers to participate in program The patient should benefit from training in stress management and relaxation.      Screening Interventions   Interventions Encouraged to exercise;To provide support and resources with identified psychosocial needs;Provide feedback about the scores to participant     Expected Outcomes Long Term Goal: Stressors or current issues are controlled or eliminated.;Short Term goal: Identification and review with participant of any Quality of Life or Depression concerns found by scoring the questionnaire.;Long Term goal: The participant improves quality of Life and PHQ9 Scores as seen by post scores and/or verbalization of changes             Quality of Life Scores:  Quality of Life - 11/20/23 1416       Quality of Life   Select Quality of Life      Quality of Life Scores   Health/Function Pre 28.36 %    Socioeconomic Pre 27.6 %    Psych/Spiritual Pre 30 %    Family Pre 24 %    GLOBAL Pre 28 %            Scores of 19 and below usually indicate a poorer quality of life in these areas.  A difference of  2-3 points is a clinically meaningful difference.  A difference of 2-3 points in the total score of the Quality of Life Index has been associated with significant improvement in overall quality of life, self-image, physical symptoms, and general health in studies assessing change in quality of life.  PHQ-9: Review Flowsheet       11/20/2023  Depression screen PHQ 2/9  Decreased Interest 0  Down, Depressed, Hopeless 0  PHQ - 2 Score 0  Altered sleeping 0  Tired, decreased energy 0  Change in appetite 0  Feeling bad or failure about yourself  0  Trouble concentrating 0  Moving slowly or fidgety/restless 0  Suicidal thoughts 0  PHQ-9 Score 0  Difficult doing work/chores --   Interpretation of Total Score  Total Score Depression Severity:  1-4 = Minimal depression, 5-9 = Mild depression, 10-14 = Moderate depression, 15-19 = Moderately severe depression, 20-27 = Severe depression  Psychosocial Evaluation and Intervention:   Psychosocial Re-Evaluation:  Psychosocial Re-Evaluation     Row Name 12/04/23 1124 01/02/24 1319           Psychosocial Re-Evaluation   Current issues with None Identified None Identified      Interventions  Encouraged to attend Cardiac Rehabilitation for the exercise Encouraged to attend Cardiac Rehabilitation for the exercise      Continue Psychosocial Services  No Follow up required No Follow up required               Psychosocial Discharge (Final Psychosocial Re-Evaluation):  Psychosocial Re-Evaluation - 01/02/24 1319       Psychosocial Re-Evaluation   Current issues with None Identified    Interventions Encouraged to attend Cardiac Rehabilitation for the exercise    Continue Psychosocial Services  No Follow up required             Vocational Rehabilitation: Provide vocational rehab assistance to qualifying candidates.   Vocational Rehab Evaluation & Intervention:  Vocational Rehab - 11/20/23 1420       Initial Vocational Rehab Evaluation & Intervention   Assessment shows need for Vocational Rehabilitation No   no needs, Pt is retired            Education: Education Goals: Education classes will be provided on a weekly basis, covering required topics. Participant will state understanding/return demonstration of topics presented.    Education     Row Name 11/24/23 0800     Education   Cardiac Education Topics Pritikin   Select Core Videos     Core Videos   Educator Exercise Physiologist   Select Nutrition   Nutrition Fueling a Healthy Body   Instruction Review Code 1- Verbalizes Understanding   Class Start Time 0815   Class Stop Time 0900   Class Time Calculation (min) 45 min    Row Name 11/26/23 0900     Education   Cardiac Education Topics Pritikin   Customer service manager   Weekly Topic [employer]isine- Spotlight on the [facility] Zones   Instruction Review Code 1- Verbalizes Understanding   Class Start Time 0815   Class Stop Time 0850   Class Time Calculation (min) 35 min    Row Name 12/01/23 0900     Education   Cardiac Education Topics Pritikin   Select Workshops     Workshops   Educator  Exercise Physiologist   Select Psychosocial   Psychosocial Workshop Healthy Sleep for a Healthy Heart   Instruction Review Code 1- Verbalizes Understanding   Class Start Time [phone]   Class Stop Time 0858   Class Time Calculation (min) 46 min    Row Name 12/03/23 0900     Education   Cardiac Education Topics Pritikin   Secondary school teacher School   Educator Dietitian   Weekly Topic Simple Sides and Sauces   Instruction Review Code 1- Verbalizes Understanding   Class Start Time 0815   Class Stop Time 0855   Class Time Calculation (min) 40 min    Row Name 12/05/23 0900     Education   Cardiac Education Topics Pritikin   Select Core Videos     Core Videos   Educator Exercise Physiologist   Select Psychosocial   Psychosocial How Our Thoughts Can Heal Our Hearts   Instruction Review Code 1- Verbalizes Understanding   Class Start Time [phone]   Class  Stop Time 0848   Class Time Calculation (min) 36 min    Row Name 12/10/23 0900     Education   Cardiac Education Topics Pritikin   Secondary school teacher School   Educator Dietitian   Weekly Topic Powerhouse Plant-Based Proteins   Instruction Review Code 1- Verbalizes Understanding   Class Start Time 0815   Class Stop Time 0855   Class Time Calculation (min) 40 min    Row Name 12/12/23 0800     Education   Cardiac Education Topics Pritikin   Psychologist, forensic General Education   General Education Hypertension and Heart Disease   Instruction Review Code 1Idamae Maize Understanding    Row Name 12/12/23 1300     Education   Cardiac Education Topics Pritikin   Psychologist, forensic General Education   General Education Hypertension and Heart Disease   Instruction Review Code 1- Verbalizes Understanding   Class Start Time [phone]   Class Stop Time 0847   Class Time Calculation  (min) 35 min    Row Name 12/15/23 0800     Education   Cardiac Education Topics Pritikin   Geographical information systems officer Psychosocial   Psychosocial Workshop From Head to Heart: The Power of a Healthy Outlook   Instruction Review Code 1- Verbalizes Understanding   Class Start Time 0815   Class Stop Time 0900   Class Time Calculation (min) 45 min    Row Name 12/17/23 0900     Education   Cardiac Education Topics Pritikin   Secondary school teacher School   Educator Dietitian   Weekly Topic Tasty Appetizers and Snacks   Instruction Review Code 1- Verbalizes Understanding   Class Start Time 0815   Class Stop Time 0855   Class Time Calculation (min) 40 min    Row Name 12/19/23 0800     Education   Cardiac Education Topics Pritikin   Psychologist, forensic General Education   General Education Heart Disease Risk Reduction   Instruction Review Code 1- Verbalizes Understanding   Class Start Time 0813   Class Stop Time 0850   Class Time Calculation (min) 37 min    Row Name 12/22/23 0900     Education   Cardiac Education Topics Pritikin   Select Core Videos     Core Videos   Educator Exercise Physiologist   Select Psychosocial   Psychosocial Healthy Minds, Bodies, Hearts   Instruction Review Code 1- Verbalizes Understanding   Class Start Time 0815   Class Stop Time 0850   Class Time Calculation (min) 35 min    Row Name 12/24/23 0800     Education   Cardiac Education Topics Pritikin   Secondary school teacher School   Educator Dietitian   Weekly Topic Adding Flavor - Sodium-Free   Instruction Review Code 1- Verbalizes Understanding   Class Start Time 0815   Class Stop Time 0855   Class Time Calculation (min) 40 min    Row Name 12/26/23 0900     Education   Cardiac Education Topics Pritikin   [employer]  Workshops   Glass blower/designer Exercise   Exercise Workshop Location manager and Fall Prevention   Instruction Review Code 1- Verbalizes Understanding   Class Start Time 0810   Class Stop Time 0850   Class Time Calculation (min) 40 min    Row Name 12/29/23 0900     Education   Cardiac Education Topics Pritikin   Glass blower/designer Nutrition   Nutrition Workshop Label Reading   Instruction Review Code 1- Verbalizes Understanding   Class Start Time 0815   Class Stop Time 0900   Class Time Calculation (min) 45 min    Row Name 12/31/23 0900     Education   Cardiac Education Topics Pritikin   Select Core Videos     Core Videos   Educator Exercise Physiologist   Select Nutrition   Nutrition Other  Label reading   Instruction Review Code 1- Verbalizes Understanding   Class Start Time 0813   Class Stop Time 0844   Class Time Calculation (min) 31 min    Row Name 01/02/24 0700     Education   Cardiac Education Topics Pritikin   Orthoptist   Educator Dietitian   Weekly Topic Fast and Healthy Breakfasts   Instruction Review Code 1- Verbalizes Understanding   Class Start Time 0815   Class Stop Time 0855   Class Time Calculation (min) 40 min    Row Name 01/05/24 0700     Education   Cardiac Education Topics Pritikin   Hospital doctor Education   General Education Metabolic Syndrome and Belly Fat   Instruction Review Code 1- Verbalizes Understanding   Class Start Time 0815   Class Stop Time 0851   Class Time Calculation (min) 36 min            Core Videos: Exercise    Move It!  Clinical staff conducted group or individual video education with verbal and written material and guidebook.  Patient learns the recommended Pritikin exercise program. Exercise with the goal of living a long, healthy life. Some of the health  benefits of exercise include controlled diabetes, healthier blood pressure levels, improved cholesterol levels, improved heart and lung capacity, improved sleep, and better body composition. Everyone should speak with their doctor before starting or changing an exercise routine.  Biomechanical Limitations Clinical staff conducted group or individual video education with verbal and written material and guidebook.  Patient learns how biomechanical limitations can impact exercise and how we can mitigate and possibly overcome limitations to have an impactful and balanced exercise routine.  Body Composition Clinical staff conducted group or individual video education with verbal and written material and guidebook.  Patient learns that body composition (ratio of muscle mass to fat mass) is a key component to assessing overall fitness, rather than body weight alone. Increased fat mass, especially visceral belly fat, can put us  at increased risk for metabolic syndrome, type 2 diabetes, heart disease, and even death. It is recommended to combine diet and exercise (cardiovascular and resistance training) to improve your body composition. Seek guidance from your physician and exercise physiologist before implementing an exercise routine.  Exercise Action Plan Clinical staff conducted group or individual video education with verbal and written material and guidebook.  Patient learns the recommended strategies to achieve and  enjoy long-term exercise adherence, including variety, self-motivation, self-efficacy, and positive decision making. Benefits of exercise include fitness, good health, weight management, more energy, better sleep, less stress, and overall well-being.  Medical   Heart Disease Risk Reduction Clinical staff conducted group or individual video education with verbal and written material and guidebook.  Patient learns our heart is our most vital organ as it circulates oxygen, nutrients, white  blood cells, and hormones throughout the entire body, and carries waste away. Data supports a plant-based eating plan like the Pritikin Program for its effectiveness in slowing progression of and reversing heart disease. The video provides a number of recommendations to address heart disease.   Metabolic Syndrome and Belly Fat  Clinical staff conducted group or individual video education with verbal and written material and guidebook.  Patient learns what metabolic syndrome is, how it leads to heart disease, and how one can reverse it and keep it from coming back. You have metabolic syndrome if you have 3 of the following 5 criteria: abdominal obesity, high blood pressure, high triglycerides, low HDL cholesterol, and high blood sugar.  Hypertension and Heart Disease Clinical staff conducted group or individual video education with verbal and written material and guidebook.  Patient learns that high blood pressure, or hypertension, is very common in the [place] . Hypertension is largely due to excessive salt intake, but other important risk factors include being overweight, physical inactivity, drinking too much alcohol, smoking, and not eating enough potassium from fruits and vegetables. High blood pressure is a leading risk factor for heart attack, stroke, congestive heart failure, dementia, kidney failure, and premature death. Long-term effects of excessive salt intake include stiffening of the arteries and thickening of heart muscle and organ damage. Recommendations include ways to reduce hypertension and the risk of heart disease.  Diseases of Our Time - Focusing on Diabetes Clinical staff conducted group or individual video education with verbal and written material and guidebook.  Patient learns why the best way to stop diseases of our time is prevention, through food and other lifestyle changes. Medicine (such as prescription pills and surgeries) is often only a Band-Aid on the problem, not a  long-term solution. Most common diseases of our time include obesity, type 2 diabetes, hypertension, heart disease, and cancer. The Pritikin Program is recommended and has been proven to help reduce, reverse, and/or prevent the damaging effects of metabolic syndrome.  Nutrition   Overview of the Pritikin Eating Plan  Clinical staff conducted group or individual video education with verbal and written material and guidebook.  Patient learns about the Pritikin Eating Plan for disease risk reduction. The Pritikin Eating Plan emphasizes a wide variety of unrefined, minimally-processed carbohydrates, like fruits, vegetables, whole grains, and legumes. Go, Caution, and Stop food choices are explained. Plant-based and lean animal proteins are emphasized. Rationale provided for low sodium intake for blood pressure control, low added sugars for blood sugar stabilization, and low added fats and oils for coronary artery disease risk reduction and weight management.  Calorie Density  Clinical staff conducted group or individual video education with verbal and written material and guidebook.  Patient learns about calorie density and how it impacts the Pritikin Eating Plan. Knowing the characteristics of the food you choose will help you decide whether those foods will lead to weight gain or weight loss, and whether you want to consume more or less of them. Weight loss is usually a side effect of the Pritikin Eating Plan because of its focus on low  calorie-dense foods.  Label Reading  Clinical staff conducted group or individual video education with verbal and written material and guidebook.  Patient learns about the Pritikin recommended label reading guidelines and corresponding recommendations regarding calorie density, added sugars, sodium content, and whole grains.  Dining Out - Part 1  Clinical staff conducted group or individual video education with verbal and written material and guidebook.  Patient learns  that restaurant meals can be sabotaging because they can be so high in calories, fat, sodium, and/or sugar. Patient learns recommended strategies on how to positively address this and avoid unhealthy pitfalls.  Facts on Fats  Clinical staff conducted group or individual video education with verbal and written material and guidebook.  Patient learns that lifestyle modifications can be just as effective, if not more so, as many medications for lowering your risk of heart disease. A Pritikin lifestyle can help to reduce your risk of inflammation and atherosclerosis (cholesterol build-up, or plaque, in the artery walls). Lifestyle interventions such as dietary choices and physical activity address the cause of atherosclerosis. A review of the types of fats and their impact on blood cholesterol levels, along with dietary recommendations to reduce fat intake is also included.  Nutrition Action Plan  Clinical staff conducted group or individual video education with verbal and written material and guidebook.  Patient learns how to incorporate Pritikin recommendations into their lifestyle. Recommendations include planning and keeping personal health goals in mind as an important part of their success.  Healthy Mind-Set    Healthy Minds, Bodies, Hearts  Clinical staff conducted group or individual video education with verbal and written material and guidebook.  Patient learns how to identify when they are stressed. Video will discuss the impact of that stress, as well as the many benefits of stress management. Patient will also be introduced to stress management techniques. The way we think, act, and feel has an impact on our hearts.  How Our Thoughts Can Heal Our Hearts  Clinical staff conducted group or individual video education with verbal and written material and guidebook.  Patient learns that negative thoughts can cause depression and anxiety. This can result in negative lifestyle behavior and serious  health problems. Cognitive behavioral therapy is an effective method to help control our thoughts in order to change and improve our emotional outlook.  Additional Videos:  Exercise    Improving Performance  Clinical staff conducted group or individual video education with verbal and written material and guidebook.  Patient learns to use a non-linear approach by alternating intensity levels and lengths of time spent exercising to help burn more calories and lose more body fat. Cardiovascular exercise helps improve heart health, metabolism, hormonal balance, blood sugar control, and recovery from fatigue. Resistance training improves strength, endurance, balance, coordination, reaction time, metabolism, and muscle mass. Flexibility exercise improves circulation, posture, and balance. Seek guidance from your physician and exercise physiologist before implementing an exercise routine and learn your capabilities and proper form for all exercise.  Introduction to Yoga  Clinical staff conducted group or individual video education with verbal and written material and guidebook.  Patient learns about yoga, a discipline of the coming together of mind, breath, and body. The benefits of yoga include improved flexibility, improved range of motion, better posture and core strength, increased lung function, weight loss, and positive self-image. Yoga's heart health benefits include lowered blood pressure, healthier heart rate, decreased cholesterol and triglyceride levels, improved immune function, and reduced stress. Seek guidance from your physician and exercise  physiologist before implementing an exercise routine and learn your capabilities and proper form for all exercise.  Medical   Aging: Enhancing Your Quality of Life  Clinical staff conducted group or individual video education with verbal and written material and guidebook.  Patient learns key strategies and recommendations to stay in good physical health  and enhance quality of life, such as prevention strategies, having an advocate, securing a Health Care Proxy and Power of Attorney, and keeping a list of medications and system for tracking them. It also discusses how to avoid risk for bone loss.  Biology of Weight Control  Clinical staff conducted group or individual video education with verbal and written material and guidebook.  Patient learns that weight gain occurs because we consume more calories than we burn (eating more, moving less). Even if your body weight is normal, you may have higher ratios of fat compared to muscle mass. Too much body fat puts you at increased risk for cardiovascular disease, heart attack, stroke, type 2 diabetes, and obesity-related cancers. In addition to exercise, following the Pritikin Eating Plan can help reduce your risk.  Decoding Lab Results  Clinical staff conducted group or individual video education with verbal and written material and guidebook.  Patient learns that lab test reflects one measurement whose values change over time and are influenced by many factors, including medication, stress, sleep, exercise, food, hydration, pre-existing medical conditions, and more. It is recommended to use the knowledge from this video to become more involved with your lab results and evaluate your numbers to speak with your doctor.   Diseases of Our Time - Overview  Clinical staff conducted group or individual video education with verbal and written material and guidebook.  Patient learns that according to the CDC, 50% to 70% of chronic diseases (such as obesity, type 2 diabetes, elevated lipids, hypertension, and heart disease) are avoidable through lifestyle improvements including healthier food choices, listening to satiety cues, and increased physical activity.  Sleep Disorders Clinical staff conducted group or individual video education with verbal and written material and guidebook.  Patient learns how good  quality and duration of sleep are important to overall health and well-being. Patient also learns about sleep disorders and how they impact health along with recommendations to address them, including discussing with a physician.  Nutrition  Dining Out - Part 2 Clinical staff conducted group or individual video education with verbal and written material and guidebook.  Patient learns how to plan ahead and communicate in order to maximize their dining experience in a healthy and nutritious manner. Included are recommended food choices based on the type of restaurant the patient is visiting.   Fueling a Banker conducted group or individual video education with verbal and written material and guidebook.  There is a strong connection between our food choices and our health. Diseases like obesity and type 2 diabetes are very prevalent and are in large-part due to lifestyle choices. The Pritikin Eating Plan provides plenty of food and hunger-curbing satisfaction. It is easy to follow, affordable, and helps reduce health risks.  Menu Workshop  Clinical staff conducted group or individual video education with verbal and written material and guidebook.  Patient learns that restaurant meals can sabotage health goals because they are often packed with calories, fat, sodium, and sugar. Recommendations include strategies to plan ahead and to communicate with the manager, chef, or server to help order a healthier meal.  Planning Your Eating Strategy  Clinical staff conducted  group or individual video education with verbal and written material and guidebook.  Patient learns about the Pritikin Eating Plan and its benefit of reducing the risk of disease. The Pritikin Eating Plan does not focus on calories. Instead, it emphasizes high-quality, nutrient-rich foods. By knowing the characteristics of the foods, we choose, we can determine their calorie density and make informed  decisions.  Targeting Your Nutrition Priorities  Clinical staff conducted group or individual video education with verbal and written material and guidebook.  Patient learns that lifestyle habits have a tremendous impact on disease risk and progression. This video provides eating and physical activity recommendations based on your personal health goals, such as reducing LDL cholesterol, losing weight, preventing or controlling type 2 diabetes, and reducing high blood pressure.  Vitamins and Minerals  Clinical staff conducted group or individual video education with verbal and written material and guidebook.  Patient learns different ways to obtain key vitamins and minerals, including through a recommended healthy diet. It is important to discuss all supplements you take with your doctor.   Healthy Mind-Set    Smoking Cessation  Clinical staff conducted group or individual video education with verbal and written material and guidebook.  Patient learns that cigarette smoking and tobacco addiction pose a serious health risk which affects millions of people. Stopping smoking will significantly reduce the risk of heart disease, lung disease, and many forms of cancer. Recommended strategies for quitting are covered, including working with your doctor to develop a successful plan.  Culinary   Becoming a Set designer conducted group or individual video education with verbal and written material and guidebook.  Patient learns that cooking at home can be healthy, cost-effective, quick, and puts them in control. Keys to cooking healthy recipes will include looking at your recipe, assessing your equipment needs, planning ahead, making it simple, choosing cost-effective seasonal ingredients, and limiting the use of added fats, salts, and sugars.  Cooking - Breakfast and Snacks  Clinical staff conducted group or individual video education with verbal and written material and guidebook.   Patient learns how important breakfast is to satiety and nutrition through the entire day. Recommendations include key foods to eat during breakfast to help stabilize blood sugar levels and to prevent overeating at meals later in the day. Planning ahead is also a key component.  Cooking - Educational psychologist conducted group or individual video education with verbal and written material and guidebook.  Patient learns eating strategies to improve overall health, including an approach to cook more at home. Recommendations include thinking of animal protein as a side on your plate rather than center stage and focusing instead on lower calorie dense options like vegetables, fruits, whole grains, and plant-based proteins, such as beans. Making sauces in large quantities to freeze for later and leaving the skin on your vegetables are also recommended to maximize your experience.  Cooking - Healthy Salads and Dressing Clinical staff conducted group or individual video education with verbal and written material and guidebook.  Patient learns that vegetables, fruits, whole grains, and legumes are the foundations of the Pritikin Eating Plan. Recommendations include how to incorporate each of these in flavorful and healthy salads, and how to create homemade salad dressings. Proper handling of ingredients is also covered. Cooking - Soups and [employer] - Soups and Desserts Clinical staff conducted group or individual video education with verbal and written material and guidebook.  Patient learns that Pritikin soups and desserts  make for easy, nutritious, and delicious snacks and meal components that are low in sodium, fat, sugar, and calorie density, while high in vitamins, minerals, and filling fiber. Recommendations include simple and healthy ideas for soups and desserts.   Overview     The Pritikin Solution Program Overview Clinical staff conducted group or individual video education with  verbal and written material and guidebook.  Patient learns that the results of the Pritikin Program have been documented in more than 100 articles published in peer-reviewed journals, and the benefits include reducing risk factors for (and, in some cases, even reversing) high cholesterol, high blood pressure, type 2 diabetes, obesity, and more! An overview of the three key pillars of the Pritikin Program will be covered: eating well, doing regular exercise, and having a healthy mind-set.  WORKSHOPS  Exercise: Exercise Basics: Building Your Action Plan Clinical staff led group instruction and group discussion with PowerPoint presentation and patient guidebook. To enhance the learning environment the use of posters, models and videos may be added. At the conclusion of this workshop, patients will comprehend the difference between physical activity and exercise, as well as the benefits of incorporating both, into their routine. Patients will understand the FITT (Frequency, Intensity, Time, and Type) principle and how to use it to build an exercise action plan. In addition, safety concerns and other considerations for exercise and cardiac rehab will be addressed by the presenter. The purpose of this lesson is to promote a comprehensive and effective weekly exercise routine in order to improve patients' overall level of fitness.   Managing Heart Disease: Your Path to a Healthier Heart Clinical staff led group instruction and group discussion with PowerPoint presentation and patient guidebook. To enhance the learning environment the use of posters, models and videos may be added.At the conclusion of this workshop, patients will understand the anatomy and physiology of the heart. Additionally, they will understand how Pritikin's three pillars impact the risk factors, the progression, and the management of heart disease.  The purpose of this lesson is to provide a high-level overview of the heart, heart  disease, and how the Pritikin lifestyle positively impacts risk factors.  Exercise Biomechanics Clinical staff led group instruction and group discussion with PowerPoint presentation and patient guidebook. To enhance the learning environment the use of posters, models and videos may be added. Patients will learn how the structural parts of their bodies function and how these functions impact their daily activities, movement, and exercise. Patients will learn how to promote a neutral spine, learn how to manage pain, and identify ways to improve their physical movement in order to promote healthy living. The purpose of this lesson is to expose patients to common physical limitations that impact physical activity. Participants will learn practical ways to adapt and manage aches and pains, and to minimize their effect on regular exercise. Patients will learn how to maintain good posture while sitting, walking, and lifting.  Balance Training and Fall Prevention  Clinical staff led group instruction and group discussion with PowerPoint presentation and patient guidebook. To enhance the learning environment the use of posters, models and videos may be added. At the conclusion of this workshop, patients will understand the importance of their sensorimotor skills (vision, proprioception, and the vestibular system) in maintaining their ability to balance as they age. Patients will apply a variety of balancing exercises that are appropriate for their current level of function. Patients will understand the common causes for poor balance, possible solutions to these problems, and ways  to modify their physical environment in order to minimize their fall risk. The purpose of this lesson is to teach patients about the importance of maintaining balance as they age and ways to minimize their risk of falling.  WORKSHOPS   Nutrition:  Fueling a Ship broker led group instruction and group  discussion with PowerPoint presentation and patient guidebook. To enhance the learning environment the use of posters, models and videos may be added. Patients will review the foundational principles of the Pritikin Eating Plan and understand what constitutes a serving size in each of the food groups. Patients will also learn Pritikin-friendly foods that are better choices when away from home and review make-ahead meal and snack options. Calorie density will be reviewed and applied to three nutrition priorities: weight maintenance, weight loss, and weight gain. The purpose of this lesson is to reinforce (in a group setting) the key concepts around what patients are recommended to eat and how to apply these guidelines when away from home by planning and selecting Pritikin-friendly options. Patients will understand how calorie density may be adjusted for different weight management goals.  Mindful Eating  Clinical staff led group instruction and group discussion with PowerPoint presentation and patient guidebook. To enhance the learning environment the use of posters, models and videos may be added. Patients will briefly review the concepts of the Pritikin Eating Plan and the importance of low-calorie dense foods. The concept of mindful eating will be introduced as well as the importance of paying attention to internal hunger signals. Triggers for non-hunger eating and techniques for dealing with triggers will be explored. The purpose of this lesson is to provide patients with the opportunity to review the basic principles of the Pritikin Eating Plan, discuss the value of eating mindfully and how to measure internal cues of hunger and fullness using the Hunger Scale. Patients will also discuss reasons for non-hunger eating and learn strategies to use for controlling emotional eating.  Targeting Your Nutrition Priorities Clinical staff led group instruction and group discussion with PowerPoint presentation and  patient guidebook. To enhance the learning environment the use of posters, models and videos may be added. Patients will learn how to determine their genetic susceptibility to disease by reviewing their family history. Patients will gain insight into the importance of diet as part of an overall healthy lifestyle in mitigating the impact of genetics and other environmental insults. The purpose of this lesson is to provide patients with the opportunity to assess their personal nutrition priorities by looking at their family history, their own health history and current risk factors. Patients will also be able to discuss ways of prioritizing and modifying the Pritikin Eating Plan for their highest risk areas  Menu  Clinical staff led group instruction and group discussion with PowerPoint presentation and patient guidebook. To enhance the learning environment the use of posters, models and videos may be added. Using menus brought in from [employer], or printed from [employer], patients will apply the Pritikin dining out guidelines that were presented in the [employer] video. Patients will also be able to practice these guidelines in a variety of provided scenarios. The purpose of this lesson is to provide patients with the opportunity to practice hands-on learning of the Pritikin Dining Out guidelines with actual menus and practice scenarios.  Label Reading Clinical staff led group instruction and group discussion with PowerPoint presentation and patient guidebook. To enhance the learning environment the use of posters, models and videos  may be added. Patients will review and discuss the Pritikin label reading guidelines presented in Pritikin's Label Reading Educational series video. Using fool labels brought in from local grocery stores and markets, patients will apply the label reading guidelines and determine if the packaged food meet the Pritikin guidelines. The purpose of this  lesson is to provide patients with the opportunity to review, discuss, and practice hands-on learning of the Pritikin Label Reading guidelines with actual packaged food labels. Cooking School  Pritikin's [employer] are designed to teach patients ways to prepare quick, simple, and affordable recipes at home. The importance of nutrition's role in chronic disease risk reduction is reflected in its emphasis in the overall Pritikin program. By learning how to prepare essential core Pritikin Eating Plan recipes, patients will increase control over what they eat; be able to customize the flavor of foods without the use of added salt, sugar, or fat; and improve the quality of the food they consume. By learning a set of core recipes which are easily assembled, quickly prepared, and affordable, patients are more likely to prepare more healthy foods at home. These workshops focus on convenient breakfasts, simple entres, side dishes, and desserts which can be prepared with minimal effort and are consistent with nutrition recommendations for cardiovascular risk reduction. Cooking [employer] are taught by a Armed forces logistics/support/administrative officer (RD) who has been trained by the [employer]. The chef or RD has a clear understanding of the importance of minimizing - if not completely eliminating - added fat, sugar, and sodium in recipes. Throughout the series of Cooking School Workshop sessions, patients will learn about healthy ingredients and efficient methods of cooking to build confidence in their capability to prepare    Cooking School weekly topics:  Adding Flavor- Sodium-Free  Fast and Healthy Breakfasts  Powerhouse Plant-Based Proteins  Satisfying Salads and Dressings  Simple Sides and Sauces  International Cuisine-Spotlight on the [employer] Zones  Delicious Desserts  Savory Soups  [employer] - Meals in a Astronomer Appetizers and Snacks  Comforting Weekend Breakfasts  One-Pot  Wonders   Fast Evening Meals  Landscape architect Your Pritikin Plate  WORKSHOPS   Healthy Mindset (Psychosocial):  Focused Goals, Sustainable Changes Clinical staff led group instruction and group discussion with PowerPoint presentation and patient guidebook. To enhance the learning environment the use of posters, models and videos may be added. Patients will be able to apply effective goal setting strategies to establish at least one personal goal, and then take consistent, meaningful action toward that goal. They will learn to identify common barriers to achieving personal goals and develop strategies to overcome them. Patients will also gain an understanding of how our mind-set can impact our ability to achieve goals and the importance of cultivating a positive and growth-oriented mind-set. The purpose of this lesson is to provide patients with a deeper understanding of how to set and achieve personal goals, as well as the tools and strategies needed to overcome common obstacles which may arise along the way.  From Head to Heart: The Power of a Healthy Outlook  Clinical staff led group instruction and group discussion with PowerPoint presentation and patient guidebook. To enhance the learning environment the use of posters, models and videos may be added. Patients will be able to recognize and describe the impact of emotions and mood on physical health. They will discover the importance of self-care and explore self-care practices which may work for them. Patients will  also learn how to utilize the 4 C's to cultivate a healthier outlook and better manage stress and challenges. The purpose of this lesson is to demonstrate to patients how a healthy outlook is an essential part of maintaining good health, especially as they continue their cardiac rehab journey.  Healthy Sleep for a Healthy Heart Clinical staff led group instruction and group discussion with PowerPoint presentation and  patient guidebook. To enhance the learning environment the use of posters, models and videos may be added. At the conclusion of this workshop, patients will be able to demonstrate knowledge of the importance of sleep to overall health, well-being, and quality of life. They will understand the symptoms of, and treatments for, common sleep disorders. Patients will also be able to identify daytime and nighttime behaviors which impact sleep, and they will be able to apply these tools to help manage sleep-related challenges. The purpose of this lesson is to provide patients with a general overview of sleep and outline the importance of quality sleep. Patients will learn about a few of the most common sleep disorders. Patients will also be introduced to the concept of "sleep hygiene," and discover ways to self-manage certain sleeping problems through simple daily behavior changes. Finally, the workshop will motivate patients by clarifying the links between quality sleep and their goals of heart-healthy living.   Recognizing and Reducing Stress Clinical staff led group instruction and group discussion with PowerPoint presentation and patient guidebook. To enhance the learning environment the use of posters, models and videos may be added. At the conclusion of this workshop, patients will be able to understand the types of stress reactions, differentiate between acute and chronic stress, and recognize the impact that chronic stress has on their health. They will also be able to apply different coping mechanisms, such as reframing negative self-talk. Patients will have the opportunity to practice a variety of stress management techniques, such as deep abdominal breathing, progressive muscle relaxation, and/or guided imagery.  The purpose of this lesson is to educate patients on the role of stress in their lives and to provide healthy techniques for coping with it.  Learning Barriers/Preferences:  Learning  Barriers/Preferences - 11/20/23 1417       Learning Barriers/Preferences   Learning Barriers Sight;Exercise Concerns   poor balance, falls, pt wears reading glasses   Learning Preferences Audio;Computer/Internet;Group Instruction;Individual Instruction;Pictoral;Skilled Demonstration;Verbal Instruction;Video;Written Material             Education Topics:  Knowledge Questionnaire Score:  Knowledge Questionnaire Score - 11/20/23 1417       Knowledge Questionnaire Score   Pre Score 21/24             Core Components/Risk Factors/Patient Goals at Admission:  Personal Goals and Risk Factors at Admission - 11/20/23 1420       Core Components/Risk Factors/Patient Goals on Admission    Weight Management Yes;Weight Loss    Intervention Weight Management: Develop a combined nutrition and exercise program designed to reach desired caloric intake, while maintaining appropriate intake of nutrient and fiber, sodium and fats, and appropriate energy expenditure required for the weight goal.;Weight Management: Provide education and appropriate resources to help participant work on and attain dietary goals.;Weight Management/Obesity: Establish reasonable short term and long term weight goals.;Obesity: Provide education and appropriate resources to help participant work on and attain dietary goals.    Diabetes Yes    Intervention Provide education about signs/symptoms and action to take for hypo/hyperglycemia.;Provide education about proper nutrition, including hydration, and aerobic/resistive exercise  prescription along with prescribed medications to achieve blood glucose in normal ranges: Fasting glucose 65-99 mg/dL    Expected Outcomes Short Term: Participant verbalizes understanding of the signs/symptoms and immediate care of hyper/hypoglycemia, proper foot care and importance of medication, aerobic/resistive exercise and nutrition plan for blood glucose control.;Long Term: Attainment of HbA1C <  7%.    Heart Failure Yes    Intervention Provide a combined exercise and nutrition program that is supplemented with education, support and counseling about heart failure. Directed toward relieving symptoms such as shortness of breath, decreased exercise tolerance, and extremity edema.    Expected Outcomes Improve functional capacity of life;Short term: Attendance in program 2-3 days a week with increased exercise capacity. Reported lower sodium intake. Reported increased fruit and vegetable intake. Reports medication compliance.;Short term: Daily weights obtained and reported for increase. Utilizing diuretic protocols set by physician.;Long term: Adoption of self-care skills and reduction of barriers for early signs and symptoms recognition and intervention leading to self-care maintenance.    Hypertension Yes    Intervention Provide education on lifestyle modifcations including regular physical activity/exercise, weight management, moderate sodium restriction and increased consumption of fresh fruit, vegetables, and low fat dairy, alcohol moderation, and smoking cessation.;Monitor prescription use compliance.    Expected Outcomes Short Term: Continued assessment and intervention until BP is < 140/63mm HG in hypertensive participants. < 130/47mm HG in hypertensive participants with diabetes, heart failure or chronic kidney disease.;Long Term: Maintenance of blood pressure at goal levels.    Lipids Yes    Intervention Provide education and support for participant on nutrition & aerobic/resistive exercise along with prescribed medications to achieve LDL 70mg , HDL >40mg .    Expected Outcomes Short Term: Participant states understanding of desired cholesterol values and is compliant with medications prescribed. Participant is following exercise prescription and nutrition guidelines.;Long Term: Cholesterol controlled with medications as prescribed, with individualized exercise RX and with personalized  nutrition plan. Value goals: LDL < 70mg , HDL > 40 mg.    Personal Goal Other Yes    Personal Goal Pt would like more flexibility, and more stabole when walking, increase in endurance    Intervention Will continue to monitor pt and progress workloads as tolerated without sign or symptom    Expected Outcomes Pt will achieve her goals and increase strength             Core Components/Risk Factors/Patient Goals Review:   Goals and Risk Factor Review     Row Name 12/04/23 1125 01/02/24 1320           Core Components/Risk Factors/Patient Goals Review   Personal Goals Review Weight Management/Obesity;Heart Failure;Hypertension;Lipids;Diabetes Weight Management/Obesity;Heart Failure;Hypertension;Lipids;Diabetes      Review Greenly started cardiac rehab on 11/24/23. Odette is off to a good start to exercise. Vital sings and CBG's have been stable. Isadora is doing well with exercise at cardiac rehab. Vital signs and CBG's have been stable. Ernerstine has increased her met levels. Jonella is happy with her progress at cardiac rehab and feels better since she started the program.      Expected Outcomes Armeda will continue to participate in cardiac rehab for exercise, nutrition and lifestyle modifications Srishti will continue to participate in cardiac rehab for exercise, nutrition and lifestyle modifications               Core Components/Risk Factors/Patient Goals at Discharge (Final Review):   Goals and Risk Factor Review - 01/02/24 1320       Core Components/Risk Factors/Patient Goals Review  Personal Goals Review Weight Management/Obesity;Heart Failure;Hypertension;Lipids;Diabetes    Review Clancy is doing well with exercise at cardiac rehab. Vital signs and CBG's have been stable. Ernerstine has increased her met levels. Mayara is happy with her progress at cardiac rehab and feels better since she started the program.    Expected Outcomes Lyliana will continue  to participate in cardiac rehab for exercise, nutrition and lifestyle modifications             ITP Comments:  ITP Comments     Row Name 11/20/23 5409 12/04/23 1123 01/02/24 1318       ITP Comments Dr. Gaylyn Keas medical director. Introduction to pritikin education/intensive cardiac rehab. Initial orientation packet reviewed with patient. 30 Day ITP Review. Ari started cardiac rehab on 12/03/23. Russell is off to a good start to exercise. 30 Day ITP Review. Caramia has good attendance and partcipaiton with exercise at  cardiac rehab              Comments: See ITP comment

## 2024-01-07 ENCOUNTER — Encounter (HOSPITAL_COMMUNITY)
Admission: RE | Admit: 2024-01-07 | Discharge: 2024-01-07 | Disposition: A | Discharge status: Home / Self Care | Source: Ambulatory Visit | Attending: Cardiology | Admitting: Cardiology

## 2024-01-07 ENCOUNTER — Encounter (HOSPITAL_COMMUNITY)

## 2024-01-07 DIAGNOSIS — I5042 Chronic combined systolic (congestive) and diastolic (congestive) heart failure: Secondary | ICD-10-CM | POA: Diagnosis not present

## 2024-01-09 ENCOUNTER — Encounter (HOSPITAL_COMMUNITY)
Admission: RE | Admit: 2024-01-09 | Discharge: 2024-01-09 | Disposition: A | Discharge status: Home / Self Care | Source: Ambulatory Visit | Attending: Cardiology | Admitting: Cardiology

## 2024-01-09 ENCOUNTER — Encounter (HOSPITAL_COMMUNITY)

## 2024-01-09 DIAGNOSIS — I5042 Chronic combined systolic (congestive) and diastolic (congestive) heart failure: Secondary | ICD-10-CM

## 2024-01-14 ENCOUNTER — Telehealth (HOSPITAL_COMMUNITY): Payer: Self-pay | Admitting: *Deleted

## 2024-01-14 ENCOUNTER — Encounter (HOSPITAL_COMMUNITY)

## 2024-01-14 ENCOUNTER — Encounter (HOSPITAL_COMMUNITY): Admission: RE | Admit: 2024-01-14 | Source: Ambulatory Visit

## 2024-01-14 ENCOUNTER — Telehealth (HOSPITAL_COMMUNITY): Payer: Self-pay

## 2024-01-14 NOTE — Telephone Encounter (Signed)
 Opened in error

## 2024-01-14 NOTE — Telephone Encounter (Signed)
 Patient c/o for 6:45am class, her transportation was late this morning. Will be in Friday.

## 2024-01-16 ENCOUNTER — Encounter (HOSPITAL_COMMUNITY)

## 2024-01-16 ENCOUNTER — Encounter (HOSPITAL_COMMUNITY)
Admission: RE | Admit: 2024-01-16 | Discharge: 2024-01-16 | Disposition: A | Discharge status: Home / Self Care | Source: Ambulatory Visit | Attending: Cardiology | Admitting: Cardiology

## 2024-01-16 DIAGNOSIS — I5042 Chronic combined systolic (congestive) and diastolic (congestive) heart failure: Secondary | ICD-10-CM

## 2024-01-19 ENCOUNTER — Encounter (HOSPITAL_COMMUNITY)
Admission: RE | Admit: 2024-01-19 | Discharge: 2024-01-19 | Disposition: A | Discharge status: Home / Self Care | Source: Ambulatory Visit | Attending: Cardiology | Admitting: Cardiology

## 2024-01-19 ENCOUNTER — Encounter (HOSPITAL_COMMUNITY)

## 2024-01-19 DIAGNOSIS — I5042 Chronic combined systolic (congestive) and diastolic (congestive) heart failure: Secondary | ICD-10-CM | POA: Diagnosis present

## 2024-01-21 ENCOUNTER — Encounter (HOSPITAL_COMMUNITY)
Admission: RE | Admit: 2024-01-21 | Discharge: 2024-01-21 | Disposition: A | Discharge status: Home / Self Care | Source: Ambulatory Visit | Attending: Cardiology | Admitting: Cardiology

## 2024-01-21 ENCOUNTER — Encounter (HOSPITAL_COMMUNITY)

## 2024-01-21 DIAGNOSIS — I5042 Chronic combined systolic (congestive) and diastolic (congestive) heart failure: Secondary | ICD-10-CM

## 2024-01-23 ENCOUNTER — Encounter (HOSPITAL_COMMUNITY)

## 2024-01-23 ENCOUNTER — Encounter (HOSPITAL_COMMUNITY)
Admission: RE | Admit: 2024-01-23 | Discharge: 2024-01-23 | Disposition: A | Discharge status: Home / Self Care | Source: Ambulatory Visit | Attending: Cardiology | Admitting: Cardiology

## 2024-01-23 DIAGNOSIS — I5042 Chronic combined systolic (congestive) and diastolic (congestive) heart failure: Secondary | ICD-10-CM

## 2024-01-26 ENCOUNTER — Encounter (HOSPITAL_COMMUNITY)
Admission: RE | Admit: 2024-01-26 | Discharge: 2024-01-26 | Disposition: A | Discharge status: Home / Self Care | Source: Ambulatory Visit | Attending: Cardiology

## 2024-01-26 ENCOUNTER — Encounter (HOSPITAL_COMMUNITY)

## 2024-01-26 DIAGNOSIS — I5042 Chronic combined systolic (congestive) and diastolic (congestive) heart failure: Secondary | ICD-10-CM | POA: Diagnosis not present

## 2024-01-27 ENCOUNTER — Ambulatory Visit: Admitting: Podiatry

## 2024-01-27 ENCOUNTER — Encounter: Payer: Self-pay | Admitting: Podiatry

## 2024-01-27 ENCOUNTER — Ambulatory Visit (INDEPENDENT_AMBULATORY_CARE_PROVIDER_SITE_OTHER): Admitting: Podiatry

## 2024-01-27 DIAGNOSIS — B351 Tinea unguium: Secondary | ICD-10-CM

## 2024-01-27 DIAGNOSIS — M79609 Pain in unspecified limb: Secondary | ICD-10-CM

## 2024-01-27 DIAGNOSIS — E1169 Type 2 diabetes mellitus with other specified complication: Secondary | ICD-10-CM | POA: Diagnosis not present

## 2024-01-27 NOTE — Progress Notes (Signed)
 Subjective:  Patient ID: Paula Wong, female    DOB: 1946-08-10,   MRN: [id number]  Chief Complaint  Patient presents with   Diabetes    Patient is here for [facility]    78 y.o. female presents concern of thickened elongated and painful nails that are difficult to trim. Requesting to have them trimmed today. Relates burning and tingling in their feet. Patient is diabetic and last A1c was  Lab Results  Component Value Date   HGBA1C 6.2 (H) 03/06/2023   .  Does relates recent fracture of her foot and ankle after tripping over an extension cord. Relates she just got out of the boot and doing well.   PCP:  Hershell Lose, NP     PCP:  Hershell Lose, NP    . Denies any other pedal complaints. Denies n/v/f/c.   Past Medical History:  Diagnosis Date   Allergy    CAD (coronary artery disease)    a. 03/2022 Cath: LM 40ost, 71m, LAD 40ost/p, 42m, 25m, LCX 13m, RCA nl, RV branch 70-->Med rx.   Chronic HFrEF (heart failure with reduced ejection fraction) (HCC)    a. 11/2021 Echo: EF 20-25%; b. 03/2022 Echo: EF 20-25%; c. 11/2022 Echo: EF 25-30%, glob HK.   Diabetes (HCC)    Diverticulosis    GERD (gastroesophageal reflux disease)    Hiatal hernia    Hyperlipidemia    Hypertension    Implantable loop recorder present    a. 03/2022 s/p Abbott IQ EL+ (serial [id number]) ILR in setting of cryptogenic stroke.   Ischemic cardiomyopathy    a. 11/2021 Echo: EF 20-25%; b. 03/2022 Echo: EF 20-25%; c. 11/2022 Echo: EF 25-30%, glob HK, nl RV fxn, mild-mod MR, mild AI, AoV sclerosis.   Neuropathy    feet   Schatzki's ring    Stroke [facility])    a. 03/2022 MRI Brain: subacute cortical infarct involving inf and med L occipial pole w/ petechial hemorrhage; b. 03/2022 s/p Abbott Assert IQ EL ILR.   Ventricular tachycardia (HCC)    a. 12/2022 noted on ILR.    Objective:  Physical Exam: Vascular: DP/PT pulses 2/4 bilateral. CFT <3 seconds. Absent hair growth on digits. Edema noted to bilateral lower extremities. Xerosis  noted bilaterally.  Skin. No lacerations or abrasions bilateral feet. Nails 1-5 bilateral  are thickened discolored and elongated with subungual debris.  Musculoskeletal: MMT 5/5 bilateral lower extremities in DF, PF, Inversion and Eversion. Deceased ROM in DF of ankle joint. Mild tenderness and edema noted along lateral malleolus and fibula.  Neurological: Sensation intact to light touch. Protective sensation intact bilateral.    Assessment:   1. Pain due to onychomycosis of nail   2. Type 2 diabetes mellitus with other specified complication, without long-term current use of insulin  (HCC)       Plan:  Patient was evaluated and treated and all questions answered. -Discussed and educated patient on diabetic foot care, especially with  regards to the vascular, neurological and musculoskeletal systems.  -Stressed the importance of good glycemic control and the detriment of not  controlling glucose levels in relation to the foot. -Discussed supportive shoes at all times and checking feet regularly.  -Mechanically debrided all nails 1-5 bilateral using sterile nail nipper and filed with dremel without incident  -Answered all patient questions -Patient to return  in 3 months for at risk foot care -Patient advised to call the office if any problems or questions arise in the meantime.    Jennefer Moats, DPM

## 2024-01-28 ENCOUNTER — Encounter (HOSPITAL_COMMUNITY)

## 2024-01-28 ENCOUNTER — Encounter (HOSPITAL_COMMUNITY)
Admission: RE | Admit: 2024-01-28 | Discharge: 2024-01-28 | Disposition: A | Discharge status: Home / Self Care | Source: Ambulatory Visit | Attending: Cardiology | Admitting: Cardiology

## 2024-01-28 DIAGNOSIS — I5042 Chronic combined systolic (congestive) and diastolic (congestive) heart failure: Secondary | ICD-10-CM

## 2024-01-29 ENCOUNTER — Telehealth (HOSPITAL_COMMUNITY): Payer: Self-pay | Admitting: *Deleted

## 2024-01-29 ENCOUNTER — Other Ambulatory Visit: Payer: Self-pay

## 2024-01-29 ENCOUNTER — Telehealth (HOSPITAL_COMMUNITY): Payer: Self-pay

## 2024-01-29 ENCOUNTER — Telehealth: Payer: Self-pay | Admitting: Internal Medicine

## 2024-01-29 DIAGNOSIS — K2100 Gastro-esophageal reflux disease with esophagitis, without bleeding: Secondary | ICD-10-CM

## 2024-01-29 MED ORDER — PANTOPRAZOLE SODIUM 20 MG PO TBEC: 20.0000 mg | DELAYED_RELEASE_TABLET | Freq: Every day | ORAL | 0 refills | Status: DC

## 2024-01-29 NOTE — Telephone Encounter (Signed)
-----   Message from [facility] sent at 01/29/2024  1:32 PM EDT ----- Regarding: RE: PVCs/Pairs Reviewed strips. No need to limit cardiac rehab at this time.   Adi ----- Message ----- From: Moise Anes, RN Sent: 01/28/2024   8:20 AM EDT To: Alwin Baars, DO Subject: PVCs/Pairs                                     Dr. Bruce Caper,  Hedi Barkan is currently participating in cardiac rehab and we are anticipating her completion of the program at the end of this month.  The past couple of sessions we have noticed an increase in PVCs and pairs especially during her walking activity.  She has been asymptomatic so we have been keeping a watch on her rhythm. Today during her walking activity, she had an increase in pairs, multi-focal PVCs, she was asymptomatic. The PVCs and pairs decreased as she transitioned to the recumbent stepper activity as well as in the cool-down phase.  Will you please review the session strips for today 01/28/24 in EPIC>Chart Review>Encounters>Intensive Cardiac Rehab- scanned session document. There are prior encounters if you need to also review any of those session strips as as well. I informed Florencia I would inform you of the increase in PVCs. Please advise as indicated.   Thank you,  Hollice Luo RN,MHA

## 2024-01-29 NOTE — Telephone Encounter (Signed)
 Patient requesting refill for pantoprazole  medication. Patient has been scheduled for 7/21. Please advise, thank you

## 2024-01-29 NOTE — Telephone Encounter (Signed)
 Per Dr. Bruce Caper- patient having more PVC's during cardiac rehab. Would like to place a zio monitor for 7 days to evaluate.   Due to transportation- patient would like to wait until appointment next Tuesday to have zio placed. Advised patient that we will place at that time.   Patient aware of appointment time and date and verbalized understanding

## 2024-01-29 NOTE — Telephone Encounter (Signed)
 Refill sent to patients pharmacy.

## 2024-01-30 ENCOUNTER — Encounter (HOSPITAL_COMMUNITY)

## 2024-01-30 ENCOUNTER — Encounter (HOSPITAL_COMMUNITY)
Admission: RE | Admit: 2024-01-30 | Discharge: 2024-01-30 | Disposition: A | Discharge status: Home / Self Care | Source: Ambulatory Visit | Attending: Cardiology | Admitting: Cardiology

## 2024-01-30 DIAGNOSIS — I5042 Chronic combined systolic (congestive) and diastolic (congestive) heart failure: Secondary | ICD-10-CM

## 2024-02-02 ENCOUNTER — Telehealth (HOSPITAL_COMMUNITY): Payer: Self-pay | Admitting: Cardiology

## 2024-02-02 ENCOUNTER — Encounter (HOSPITAL_COMMUNITY)

## 2024-02-02 ENCOUNTER — Encounter (HOSPITAL_COMMUNITY)
Admission: RE | Admit: 2024-02-02 | Discharge: 2024-02-02 | Disposition: A | Discharge status: Home / Self Care | Source: Ambulatory Visit | Attending: Cardiology | Admitting: Cardiology

## 2024-02-02 DIAGNOSIS — I5042 Chronic combined systolic (congestive) and diastolic (congestive) heart failure: Secondary | ICD-10-CM

## 2024-02-02 NOTE — Progress Notes (Signed)
 ADVANCED HEART FAILURE CLINIC NOTE  Referring Physician: Hershell Lose, NP  Primary Care: Hershell Lose, NP Primary Cardiologist: Freddy Jain, MD  HPI: Paula Wong is a 78 y.o. female with CAD, HFrEF, history of VT, type 2 diabetes, history of stroke, hypertension, hyperlipidemia presenting today to establish care.  She was admitted to [facility] for CVA in August 2023.  She had an echocardiogram at that time that demonstrated EF of 20 to 25%.  This was followed by left heart catheterization with 40% left main, 40% ostial LAD and 80% mid LAD disease.  Her cardiomyopathy was felt to be out of proportion to CAD and she was treated medically at that time with placement of implantable loop recorder.  In April 2024 she had a follow-up echo with persistently reduced EF and moderate mitral regurgitation.  In May 2024 she was admitted after an episode of sustained polymorphic VT that was felt to be ischemic.  She was started on IV amiodarone  with repeat left heart catheterization demonstrating persistently severe disease now status post PCI to the mid LAD the lesion.  She was readmitted in 01/06/2023 with acute hypoxic respiratory failure due to decompensated heart failure.  Since that time she has been seen in [facility] clinic where medications have been further uptitrated.  Interval hx:  -She is now participating in cardiac rehab. Reports that she loves cardiac rehab. She feels much better now than she has in  years.  - Received a message from cardiac rehab that she is having frequent PVCs.  Ms. Nichols reports that these are fairly asymptomatic for her but is unable to completely recall these events.    Current Outpatient Medications  Medication Sig Dispense Refill   acetaminophen  (TYLENOL ) 325 MG tablet Take 2 tablets (650 mg total) by mouth every 4 (four) hours as needed for headache or mild pain (pain score 1-3).     amiodarone  (PACERONE ) 200 MG tablet Take 200 mg by mouth daily.     aspirin  81  MG chewable tablet Chew 1 tablet (81 mg total) by mouth daily. 90 tablet 3   atorvastatin  (LIPITOR ) 80 MG tablet Take 80 mg by mouth at bedtime.     B-D ULTRAFINE III SHORT PEN 31G X 8 MM MISC Inject into the skin daily.     clopidogrel  (PLAVIX ) 75 MG tablet Take 1 tablet (75 mg total) by mouth daily. 30 tablet 11   FARXIGA  10 MG TABS tablet Take 10 mg by mouth daily.     furosemide  (LASIX ) 20 MG tablet Take 1 tablet (20 mg total) by mouth daily. May take an additional tablet as needed for swelling/SOB 100 tablet 3   gabapentin  (NEURONTIN ) 600 MG tablet Take 600 mg by mouth 2 (two) times daily.     magnesium  oxide (MAG-OX) 400 MG tablet Take 400 mg by mouth daily.     metFORMIN  (GLUCOPHAGE -XR) 500 MG 24 hr tablet Take 500 mg by mouth daily with breakfast.     nitroGLYCERIN  (NITROSTAT ) 0.4 MG SL tablet Place 1 tablet (0.4 mg total) under the tongue every 5 (five) minutes x 3 doses as needed for chest pain. 25 tablet 2   ONETOUCH ULTRA test strip USE TO CHECK BLOOD SUGARS ONCE DAILY AND AS NEEDED     OZEMPIC, 1 MG/DOSE, 4 MG/3ML SOPN Inject 1 mg into the skin once a week.     pantoprazole  (PROTONIX ) 20 MG tablet Take 1 tablet (20 mg total) by mouth daily. 90 tablet 0   sacubitril -valsartan  (ENTRESTO )  97-103 MG Take 1 tablet by mouth 2 (two) times daily. 60 tablet 5   spironolactone  (ALDACTONE ) 25 MG tablet Take 1 tablet (25 mg total) by mouth daily. 90 tablet 3   TRESIBA FLEXTOUCH 200 UNIT/ML SOPN Take 80 Units by mouth at bedtime.      carvedilol  (COREG ) 25 MG tablet Take 0.5 tablets (12.5 mg total) by mouth every morning AND 1 tablet (25 mg total) every evening.     No current facility-administered medications for this encounter.    PHYSICAL EXAM: Vitals:   02/03/24 0841  BP: 138/72  Pulse: 82  SpO2: 96%   GENERAL: NAD Lungs-CTA CARDIAC:  JVP: 5 cm          Normal rate with regular rhythm.  No murmur.  Pulses 2+.  No edema.  ABDOMEN: Soft, non-tender, non-distended.  EXTREMITIES:  Warm and well perfused.  NEUROLOGIC: No obvious FND    DATA REVIEW  ECG: 01/24/23: sinus bradycardia as per my personal interpretation 05/09/23: NSR  ECHO: 12/06/22: LVEF 25%, normal RV function as per my personal interpretation 11/04/23: LVEF 25%-30%  CATH: 12/31/22:   Mid Cx lesion is 5% stenosed.   Ost LAD to Prox LAD lesion is 40% stenosed.   Mid LAD-2 lesion is 30% stenosed.   Ost LM lesion is 40% stenosed.   Mid LM to Dist LM lesion is 30% stenosed.   RV Branch lesion is 70% stenosed.   Prox LAD to Mid LAD lesion is 85% stenosed.   Mid LAD-1 lesion is 85% stenosed.   A drug-eluting stent was successfully placed using a SYNERGY XD 3.0X24.   Post intervention, there is a 0% residual stenosis.   Post intervention, there is a 0% residual stenosis.   LV end diastolic pressure is moderately elevated.   2 vessel obstructive CAD with severe LAD stenosis sequentially with heavy calcification. Moderate RCA disease diffusely in a small branch Moderately elevated LVEDP 25 mm Hg Successful PCI of the mid LAD using IVUS guidance, Shockwave intracoronary lithotripsy and DES x 1.  03/27/22:  The left main has 40% ostial 30% distal stenosis; the LAD has 40% ostial to proximal stenosis followed by focal calcified 80% stenosis prior to the takeoff of a septal perforator and the second diagonal vessel with 30% mid stenosis; mild nonobstructive disease in the left circumflex vessel, and diffuse 70% stenosis in the marginal branch of the RCA.     ASSESSMENT & PLAN:  Heart failure with reduced ejection fraction Etiology of ZO:XWRUEAVW cardiomyopathy; plan for CMR to re-evaluate LVEFwith CMR.  NYHA class / AHA Stage: NYHA IIB; doing very well in cardiac rehab now.  Volume status & Diuretics:  mildly hypervervolemic, now only taking lasix  as needed (has not taken it in weeks). Will take one today.  Vasodilators:Continue Entresto  97/103mg  BID Beta-Blocker: increase coreg  to 25mg  at night; continue  12.5mg  during the day. Reports hypotension during the day. Will attempt to increase day time dose at follow up. ; mean HR of 86BPM on device interrogation today (personally reviewed) MRA:spironolactone  25mg  Cardiometabolic:Farxiga  10mg  Devices therapies & Valvulopathies: s/p primary prevention ICD Advanced therapies: Not indicated.  Limited by age will continue aggressive medical management.  2. Hypertension  - mildly hypertensive today - Repeat labs today.  - Increase coreg  to 25mg  BID  3. PVC/NSVT - Patient has more PVCs & NSVT during cardiac rehab.  I personally reviewed her telemetry strip from cardiac rehab and discussed with her therapist there.  Also reached out to Dr. Marven Slimmer.  -  Will place 1 week ziopatch.  - Continue amiodarone  200mg  daily.   4. CAD -Multivessel CAD with PCI to the LAD as noted above.  Continue Plavix  for 1 year -Lipid for 80 mg daily with aspirin  81 mg -No chest pain. .   5. VT -Polymorphic VT noted on loop recorder 05/24. QT prolonged on admit.  -Seen by EP.  Suspected d/t CAD.  -NSVT on May 29 by device interrogation; will continue amio 200mg  daily for now.  - Repeat TSH/LFTs.    6. 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 Seen by Neurology -Loop recorder was placed 08/23.  No AF documented -Has been on aspirin , plavix  and statin  7. Obesity - Body mass index is 31.78 kg/m. - Now on ozempic; no reported side effects.  I spent 60 minutes caring for this patient today including face to face time, ordering and reviewing labs, reviewing records from cardiac rehab, communicating with outside providers listed above, discussing ziopatch, seeing the patient, documenting in the record, and arranging follow ups.    Juna Caban Advanced Heart Failure Mechanical Circulatory Support

## 2024-02-02 NOTE — Progress Notes (Signed)
 Cardiac Individual Treatment Plan  Patient Details  Name: Paula Wong MRN: [id number] Date of Birth: [birth year] Referring Provider:   Flowsheet Row INTENSIVE CARDIAC REHAB ORIENT from 11/20/2023 in [facility]  Referring Provider Dr. Alwin Baars DO    Initial Encounter Date:  Flowsheet Row INTENSIVE CARDIAC REHAB ORIENT from 11/20/2023 in [facility] for Heart, Vascular, & Lung Health  Date 11/20/23    Visit Diagnosis: Heart failure, systolic and diastolic, chronic (HCC)  Patient's Home Medications on Admission:  Current Outpatient Medications:    acetaminophen  (TYLENOL ) 325 MG tablet, Take 2 tablets (650 mg total) by mouth every 4 (four) hours as needed for headache or mild pain (pain score 1-3)., Disp: , Rfl:    amiodarone  (PACERONE ) 200 MG tablet, Take 200 mg by mouth daily., Disp: , Rfl:    aspirin  81 MG chewable tablet, Chew 1 tablet (81 mg total) by mouth daily., Disp: 90 tablet, Rfl: 3   atorvastatin  (LIPITOR ) 80 MG tablet, Take 80 mg by mouth at bedtime., Disp: , Rfl:    B-D ULTRAFINE III SHORT PEN 31G X 8 MM MISC, Inject into the skin daily., Disp: , Rfl:    carvedilol  (COREG ) 25 MG tablet, Take 1 tablet (25 mg total) by mouth 2 (two) times daily. (Patient taking differently: Take 12.5 mg by mouth 2 (two) times daily. 11/20/23 Dose decreased to 12.5 mg twice a day by Morey Ar NP. MWW RN), Disp: 60 tablet, Rfl: 11   clopidogrel  (PLAVIX ) 75 MG tablet, Take 1 tablet (75 mg total) by mouth daily., Disp: 30 tablet, Rfl: 11   FARXIGA  10 MG TABS tablet, Take 10 mg by mouth daily., Disp: , Rfl:    furosemide  (LASIX ) 20 MG tablet, Take 1 tablet (20 mg total) by mouth daily. May take an additional tablet as needed for swelling/SOB, Disp: 100 tablet, Rfl: 3   gabapentin  (NEURONTIN ) 600 MG tablet, Take 600 mg by mouth 2 (two) times daily., Disp: , Rfl:    magnesium  oxide (MAG-OX) 400 MG tablet,  Take 400 mg by mouth daily., Disp: , Rfl:    metFORMIN  (GLUCOPHAGE -XR) 500 MG 24 hr tablet, Take 500 mg by mouth daily with breakfast., Disp: , Rfl:    nitroGLYCERIN  (NITROSTAT ) 0.4 MG SL tablet, Place 1 tablet (0.4 mg total) under the tongue every 5 (five) minutes x 3 doses as needed for chest pain., Disp: 25 tablet, Rfl: 2   ONETOUCH ULTRA test strip, USE TO CHECK BLOOD SUGARS ONCE DAILY AND AS NEEDED, Disp: , Rfl:    OZEMPIC, 1 MG/DOSE, 4 MG/3ML SOPN, Inject 1 mg into the skin once a week., Disp: , Rfl:    pantoprazole  (PROTONIX ) 20 MG tablet, Take 1 tablet (20 mg total) by mouth daily., Disp: 90 tablet, Rfl: 0   sacubitril -valsartan  (ENTRESTO ) 97-103 MG, Take 1 tablet by mouth 2 (two) times daily., Disp: 60 tablet, Rfl: 5   spironolactone  (ALDACTONE ) 25 MG tablet, Take 1 tablet (25 mg total) by mouth daily., Disp: 90 tablet, Rfl: 3   TRESIBA FLEXTOUCH 200 UNIT/ML SOPN, Take 80 Units by mouth at bedtime. , Disp: , Rfl:   Past Medical History: Past Medical History:  Diagnosis Date   Allergy    CAD (coronary artery disease)    a. 03/2022 Cath: LM 40ost, 42m, LAD 40ost/p, 65m, 64m, LCX 86m, RCA nl, RV branch 70-->Med rx.   Chronic HFrEF (heart failure with reduced ejection fraction) (HCC)  a. 11/2021 Echo: EF 20-25%; b. 03/2022 Echo: EF 20-25%; c. 11/2022 Echo: EF 25-30%, glob HK.   Diabetes (HCC)    Diverticulosis    GERD (gastroesophageal reflux disease)    Hiatal hernia    Hyperlipidemia    Hypertension    Implantable loop recorder present    a. 03/2022 s/p Abbott IQ EL+ (serial [id number]) ILR in setting of cryptogenic stroke.   Ischemic cardiomyopathy    a. 11/2021 Echo: EF 20-25%; b. 03/2022 Echo: EF 20-25%; c. 11/2022 Echo: EF 25-30%, glob HK, nl RV fxn, mild-mod MR, mild AI, AoV sclerosis.   Neuropathy    feet   Schatzki's ring    Stroke [facility])    a. 03/2022 MRI Brain: subacute cortical infarct involving inf and med L occipial pole w/ petechial hemorrhage; b. 03/2022 s/p Abbott Assert IQ  EL ILR.   Ventricular tachycardia (HCC)    a. 12/2022 noted on ILR.    Tobacco Use: Social History   Tobacco Use  Smoking Status Former   Current packs/day: 0.00   Types: Cigarettes   Quit date: 08/20/2003   Years since quitting: 20.4  Smokeless Tobacco Never    Labs: Review Flowsheet  More data exists      Latest Ref Rng & Units 10/02/2020 04/13/2022 12/31/2022 03/06/2023 05/09/2023  Labs for ITP Cardiac and Pulmonary Rehab  Cholestrol 0 - 200 mg/dL 308  657  846  - 962   LDL (calc) 0 - 99 mg/dL 90  70  41  - 60   HDL-C >40 mg/dL 41  35  39  - 43   Trlycerides <150 mg/dL 952  82  841  - 324   Hemoglobin A1c 4.8 - 5.6 % - 6.3  - 6.2  -    Capillary Blood Glucose: Lab Results  Component Value Date   GLUCAP 110 (H) 11/28/2023   GLUCAP 125 (H) 11/28/2023   GLUCAP 134 (H) 11/26/2023   GLUCAP 111 (H) 11/26/2023   GLUCAP 92 11/26/2023     Exercise Target Goals: Exercise Program Goal: Individual exercise prescription set using results from initial 6 min walk test and THRR while considering  patient's activity barriers and safety.   Exercise Prescription Goal: Initial exercise prescription builds to 30-45 minutes a day of aerobic activity, 2-3 days per week.  Home exercise guidelines will be given to patient during program as part of exercise prescription that the participant will acknowledge.  Activity Barriers & Risk Stratification:  Activity Barriers & Cardiac Risk Stratification - 11/20/23 0832       Activity Barriers & Cardiac Risk Stratification   Activity Barriers Deconditioning;Assistive Device;Decreased Ventricular Function;Balance Concerns;History of Falls    Cardiac Risk Stratification High          6 Minute Walk:  6 Minute Walk     Row Name 11/20/23 1356         6 Minute Walk   Phase Initial     Distance 750 feet     Walk Time 6 minutes     # of Rest Breaks 1  seated rest break for fatigue. from 3.00-1.19     MPH 1.42     METS 1.02     RPE 11      Perceived Dyspnea  0     VO2 Peak 3.56     Symptoms Yes (comment)     Comments Asymptomatic: low BP post walk at 82/57, H2o recheck seated 94/60 and standing: 104/62     Resting  HR 81 bpm     Resting BP 108/62     Resting Oxygen Saturation  94 %     Exercise Oxygen Saturation  during 6 min walk 98 %     Max Ex. HR 106 bpm     Max Ex. BP 110/64     2 Minute Post BP 82/57  recheck 94/60 both asymptomatic        Oxygen Initial Assessment:   Oxygen Re-Evaluation:   Oxygen Discharge (Final Oxygen Re-Evaluation):   Initial Exercise Prescription:  Initial Exercise Prescription - 11/20/23 1400       Date of Initial Exercise RX and Referring Provider   Date 11/20/23    Referring Provider Dr. Alwin Baars DO    Expected Discharge Date 02/11/24      NuStep   Level 1    SPM 60    Minutes 15    METs 1.2      Prescription Details   Frequency (times per week) 3    Duration Progress to 30 minutes of continuous aerobic without signs/symptoms of physical distress      Intensity   THRR 40-80% of Max Heartrate 57-114    Ratings of Perceived Exertion 11-13    Perceived Dyspnea 0-4      Progression   Progression Continue progressive overload as per policy without signs/symptoms or physical distress.      Resistance Training   Training Prescription Yes    Weight 2    Reps 10-15          Perform Capillary Blood Glucose checks as needed.  Exercise Prescription Changes:   Exercise Prescription Changes     Row Name 11/24/23 0823 12/08/23 0820 12/19/23 0828 01/05/24 0818 01/21/24 0827     Response to Exercise   Blood Pressure (Admit) 102/66 104/64 98/62 116/62 102/62   Blood Pressure (Exercise) 122/70 138/62 -- -- --   Blood Pressure (Exit) 102/70 106/58 102/60 110/60 92/56   Heart Rate (Admit) 72 bpm 85 bpm 76 bpm 84 bpm 87 bpm   Heart Rate (Exercise) 99 bpm 99 bpm 93 bpm 104 bpm 101 bpm   Heart Rate (Exit) 65 bpm 86 bpm 83 bpm 85 bpm 83 bpm   Rating of Perceived  Exertion (Exercise) 11 9 11 13 13    Perceived Dyspnea (Exercise) 0 0 0 0 0   Symptoms none none none none none   Comments Reviewed MET's, goals and home ExRx Reviewed MET's, goals and home ExRx REVD MET's REVD MET's and goals REVD MET's   Duration Continue with 30 min of aerobic exercise without signs/symptoms of physical distress. Continue with 30 min of aerobic exercise without signs/symptoms of physical distress. Continue with 30 min of aerobic exercise without signs/symptoms of physical distress. Continue with 30 min of aerobic exercise without signs/symptoms of physical distress. Continue with 30 min of aerobic exercise without signs/symptoms of physical distress.   Intensity THRR unchanged THRR unchanged THRR unchanged THRR unchanged THRR unchanged     Progression   Progression Continue to progress workloads to maintain intensity without signs/symptoms of physical distress. Continue to progress workloads to maintain intensity without signs/symptoms of physical distress. Continue to progress workloads to maintain intensity without signs/symptoms of physical distress. Continue to progress workloads to maintain intensity without signs/symptoms of physical distress. Continue to progress workloads to maintain intensity without signs/symptoms of physical distress.   Average METs 1.9 2 2.4 2.18 2.21     Resistance Training   Training Prescription Yes Yes Yes  Yes Yes   Weight 2 2 3 3 3    Reps 10-15 10-15 10-15 10-15 10-15   Time 10 Minutes 10 Minutes 10 Minutes 10 Minutes 10 Minutes     NuStep   Level 1 1 3 3 4    SPM 72 72 93 93 74   Minutes 15 15 15 15 15    METs 1.9 1.9 2.4 2.4 2.4     Track   Laps -- -- -- 5 8   Minutes -- -- -- 10 10   METs -- -- -- 1.96 2.02     Home Exercise Plan   Plans to continue exercise at -- Home (comment) Home (comment) Home (comment) Home (comment)   Frequency -- Add 2 additional days to program exercise sessions. Add 2 additional days to program exercise  sessions. Add 2 additional days to program exercise sessions. Add 2 additional days to program exercise sessions.   Initial Home Exercises Provided -- 12/08/23 12/08/23 12/08/23 12/08/23      Exercise Comments:   Exercise Comments     Row Name 11/24/23 0836 12/08/23 0824 12/19/23 0830 01/05/24 0821 01/21/24 0830   Exercise Comments Pt first day in the [facility] iCR program. Pt tolerated exercise well with an average MET level of 1.9. Pt is learning her THRR, RPE and ExRx. Reviewed MET's, goals and home ExRx. Pt tolerated exercise well with an average MET level of 2.0. Pt is doing well and is feeling an increase in strength and stamina. She says shes feeling good and is going to start walking on her own. She will start for 15-30 mins 1.2 days. Reviewed MET's. Pt tolerated exercise well with an average MET level of 2.4. Pt is doing well and is feeling an increase in strength and stamina. Encouraged increase SPM to INC MET's, she did great today Reviewed MET's, goals and home ExRx. Pt tolerated exercise well with an average MET level of 2.18. Pt is doing good with her goals and is feeling an increase in strength and stamina. She wanted to be able to walk more and feel better with walking. so added track for 10 mins as second station, will continue to progress to 15 mins as she's ready Reviewed MET's. Pt tolerated exercise well with an average MET level of 2.21. Pt is doing very well and is increasing laps on the track, soon she will work on increasing time to 15 mins on the track      Exercise Goals and Review:   Exercise Goals     Row Name 11/20/23 [phone]             Exercise Goals   Increase Physical Activity Yes       Intervention Provide advice, education, support and counseling about physical activity/exercise needs.;Develop an individualized exercise prescription for aerobic and resistive training based on initial evaluation findings, risk stratification, comorbidities and participant's  personal goals.       Expected Outcomes Short Term: Attend rehab on a regular basis to increase amount of physical activity.;Long Term: Exercising regularly at least 3-5 days a week.;Long Term: Add in home exercise to make exercise part of routine and to increase amount of physical activity.       Increase Strength and Stamina Yes       Intervention Develop an individualized exercise prescription for aerobic and resistive training based on initial evaluation findings, risk stratification, comorbidities and participant's personal goals.;Provide advice, education, support and counseling about physical activity/exercise needs.  Expected Outcomes Short Term: Increase workloads from initial exercise prescription for resistance, speed, and METs.;Short Term: Perform resistance training exercises routinely during rehab and add in resistance training at home;Long Term: Improve cardiorespiratory fitness, muscular endurance and strength as measured by increased METs and functional capacity ( )       Able to understand and use rate of perceived exertion (RPE) scale Yes       Intervention Provide education and explanation on how to use RPE scale       Expected Outcomes Short Term: Able to use RPE daily in rehab to express subjective intensity level;Long Term:  Able to use RPE to guide intensity level when exercising independently       Knowledge and understanding of Target Heart Rate Range (THRR) Yes       Intervention Provide education and explanation of THRR including how the numbers were predicted and where they are located for reference       Expected Outcomes Short Term: Able to state/look up THRR;Long Term: Able to use THRR to govern intensity when exercising independently;Short Term: Able to use daily as guideline for intensity in rehab       Understanding of Exercise Prescription Yes       Intervention Provide education, explanation, and written materials on patient's individual exercise prescription        Expected Outcomes Short Term: Able to explain program exercise prescription;Long Term: Able to explain home exercise prescription to exercise independently          Exercise Goals Re-Evaluation :  Exercise Goals Re-Evaluation     Row Name 11/24/23 0827 12/08/23 0821 01/05/24 0819         Exercise Goal Re-Evaluation   Exercise Goals Review Increase Physical Activity;Understanding of Exercise Prescription;Increase Strength and Stamina;Knowledge and understanding of Target Heart Rate Range (THRR);Able to understand and use rate of perceived exertion (RPE) scale Increase Physical Activity;Understanding of Exercise Prescription;Increase Strength and Stamina;Knowledge and understanding of Target Heart Rate Range (THRR);Able to understand and use rate of perceived exertion (RPE) scale Increase Physical Activity;Understanding of Exercise Prescription;Increase Strength and Stamina;Knowledge and understanding of Target Heart Rate Range (THRR);Able to understand and use rate of perceived exertion (RPE) scale     Comments Pt first day in the [facility] program. Pt tolerated exercise well with an average MET level of 1.9. Pt is learning her THRR, RPE and ExRx Reviewed MET's, goals and home ExRx. Pt tolerated exercise well with an average MET level of 2.0. Pt is doing well and is feeling an increase in strength and stamina. She says shes feeling good and is going to start walking on her own. She will start for 15-30 mins 1.2 days. Reviewed MET's, goals and home ExRx. Pt tolerated exercise well with an average MET level of 2.18. Pt is doing good with her goals and is feeling an increase in strength and stamina. She wanted to be able to walk more and feel better with walking. so added track for 10 mins as second station, will continue to progress to 15 mins as she's ready     Expected Outcomes Will continue to monitor pt and progress workloads as tolerated without sign or symptom Will continue to monitor pt  and progress workloads as tolerated without sign or symptom Will continue to monitor pt and progress workloads as tolerated without sign or symptom        Discharge Exercise Prescription (Final Exercise Prescription Changes):  Exercise Prescription Changes - 01/21/24 0827  Response to Exercise   Blood Pressure (Admit) 102/62    Blood Pressure (Exit) 92/56    Heart Rate (Admit) 87 bpm    Heart Rate (Exercise) 101 bpm    Heart Rate (Exit) 83 bpm    Rating of Perceived Exertion (Exercise) 13    Perceived Dyspnea (Exercise) 0    Symptoms none    Comments REVD MET's    Duration Continue with 30 min of aerobic exercise without signs/symptoms of physical distress.    Intensity THRR unchanged      Progression   Progression Continue to progress workloads to maintain intensity without signs/symptoms of physical distress.    Average METs 2.21      Resistance Training   Training Prescription Yes    Weight 3    Reps 10-15    Time 10 Minutes      NuStep   Level 4    SPM 74    Minutes 15    METs 2.4      Track   Laps 8    Minutes 10    METs 2.02      Home Exercise Plan   Plans to continue exercise at Home (comment)    Frequency Add 2 additional days to program exercise sessions.    Initial Home Exercises Provided 12/08/23          Nutrition:  Target Goals: Understanding of nutrition guidelines, daily intake of sodium 1500mg , cholesterol 200mg , calories 30% from fat and 7% or less from saturated fats, daily to have 5 or more servings of fruits and vegetables.  Biometrics:  Pre Biometrics - 11/20/23 0959       Pre Biometrics   Waist Circumference 41.5 inches    Hip Circumference 43 inches    Waist to Hip Ratio 0.97 %    Triceps Skinfold 38 mm    % Body Fat 46.7 %    Grip Strength 25 kg    Flexibility --   unable to reach   Single Leg Stand 4 seconds           Nutrition Therapy Plan and Nutrition Goals:  Nutrition Therapy & Goals - 01/26/24 1003        Nutrition Therapy   Diet Heart Healthy Diet    Drug/Food Interactions Statins/Certain Fruits      Personal Nutrition Goals   Nutrition Goal Patient to identify strategies for reducing cardiovascular risk by attending the [facility] education and nutrition series weekly.   goal in progress   Personal Goal #2 Patient to improve diet quality by using the plate method as a guide for meal planning to include lean protein/plant protein, fruits, vegetables, whole grains, nonfat dairy as part of a well-balanced diet.   goal in progress   Personal Goal #3 Patient to identify strategies for weight loss of 0.5-2.0# per week.   goal not met.   Comments Goals in progress. Patient has medical history of CVA, HTN, HFrEF, CAD, CKD2, DM2, hyperlipidemia. A1c is well controlled. Lipids are at goal. She is taking ozempic to aid with weight loss/blood sugar control. She has maintained her weight since starting with our program. She continues to attend the [facility] education and nutrition series regularly. Patient will benefit from participation in intensive cardiac rehab for nutrition, exercise, and lifestyle modification.      Intervention Plan   Intervention Prescribe, educate and counsel regarding individualized specific dietary modifications aiming towards targeted core components such as weight, hypertension, lipid management, diabetes, heart failure and  other comorbidities.;Nutrition handout(s) given to patient.    Expected Outcomes Short Term Goal: Understand basic principles of dietary content, such as calories, fat, sodium, cholesterol and nutrients.;Long Term Goal: Adherence to prescribed nutrition plan.          Nutrition Assessments:  Nutrition Assessments - 12/01/23 1429       Rate Your Plate Scores   Pre Score 47         MEDIFICTS Score Key: >=70 Need to make dietary changes  40-70 Heart Healthy Diet <= 40 Therapeutic Level Cholesterol Diet   Flowsheet Row INTENSIVE CARDIAC REHAB from  12/01/2023 in [facility] for Heart, Vascular, & Lung Health  Picture Your Plate Total Score on Admission 47   Picture Your Plate Scores: <96 Unhealthy dietary pattern with much room for improvement. 41-50 Dietary pattern unlikely to meet recommendations for good health and room for improvement. 51-60 More healthful dietary pattern, with some room for improvement.  >60 Healthy dietary pattern, although there may be some specific behaviors that could be improved.    Nutrition Goals Re-Evaluation:  Nutrition Goals Re-Evaluation     Row Name 11/24/23 1023 12/25/23 1147 01/26/24 1003         Goals   Current Weight 191 lb 5.8 oz (86.8 kg) 189 lb 9.5 oz (86 kg) 192 lb 0.3 oz (87.1 kg)     Comment GFR 54, Cr 1,05, A1c 6.2, lipids WNL, LDL 60 no new labs; most recent labs GFR 54, Cr 1,05, A1c 6.2, lipids WNL, LDL 60 no new labs; most recent labs GFR 54, Cr 1,05, A1c 6.2, lipids WNL, LDL 60     Expected Outcome Patient has medical history of CVA, HTN, HFrEF, CAD, CKD2, DM2, hyperlipidemia. A1c is well controlled. Lipids are at goal. She is taking ozempic to aid with weight loss/blood sugar control. Patient will benefit from participation in intensive cardiac rehab for nutrition, exercise, and lifestyle modification. Goal in progress. Patient has medical history of CVA, HTN, HFrEF, CAD, CKD2, DM2, hyperlipidemia. A1c is well controlled. Lipids are at goal. She is taking ozempic to aid with weight loss/blood sugar control. She is down 2.6# since starting with our program. She continues to attend the [facility] education and nutrition series regularly. Patient will benefit from participation in intensive cardiac rehab for nutrition, exercise, and lifestyle modification. Goals in progress. Patient has medical history of CVA, HTN, HFrEF, CAD, CKD2, DM2, hyperlipidemia. A1c is well controlled. Lipids are at goal. She is taking ozempic to aid with weight loss/blood sugar control. She has  maintained her weight since starting with our program. She continues to attend the [facility] education and nutrition series regularly. Patient will benefit from participation in intensive cardiac rehab for nutrition, exercise, and lifestyle modification.        Nutrition Goals Re-Evaluation:  Nutrition Goals Re-Evaluation     Row Name 11/24/23 1023 12/25/23 1147 01/26/24 1003         Goals   Current Weight 191 lb 5.8 oz (86.8 kg) 189 lb 9.5 oz (86 kg) 192 lb 0.3 oz (87.1 kg)     Comment GFR 54, Cr 1,05, A1c 6.2, lipids WNL, LDL 60 no new labs; most recent labs GFR 54, Cr 1,05, A1c 6.2, lipids WNL, LDL 60 no new labs; most recent labs GFR 54, Cr 1,05, A1c 6.2, lipids WNL, LDL 60     Expected Outcome Patient has medical history of CVA, HTN, HFrEF, CAD, CKD2, DM2, hyperlipidemia. A1c is well controlled. Lipids  are at goal. She is taking ozempic to aid with weight loss/blood sugar control. Patient will benefit from participation in intensive cardiac rehab for nutrition, exercise, and lifestyle modification. Goal in progress. Patient has medical history of CVA, HTN, HFrEF, CAD, CKD2, DM2, hyperlipidemia. A1c is well controlled. Lipids are at goal. She is taking ozempic to aid with weight loss/blood sugar control. She is down 2.6# since starting with our program. She continues to attend the [facility] education and nutrition series regularly. Patient will benefit from participation in intensive cardiac rehab for nutrition, exercise, and lifestyle modification. Goals in progress. Patient has medical history of CVA, HTN, HFrEF, CAD, CKD2, DM2, hyperlipidemia. A1c is well controlled. Lipids are at goal. She is taking ozempic to aid with weight loss/blood sugar control. She has maintained her weight since starting with our program. She continues to attend the [facility] education and nutrition series regularly. Patient will benefit from participation in intensive cardiac rehab for nutrition, exercise, and lifestyle  modification.        Nutrition Goals Discharge (Final Nutrition Goals Re-Evaluation):  Nutrition Goals Re-Evaluation - 01/26/24 1003       Goals   Current Weight 192 lb 0.3 oz (87.1 kg)    Comment no new labs; most recent labs GFR 54, Cr 1,05, A1c 6.2, lipids WNL, LDL 60    Expected Outcome Goals in progress. Patient has medical history of CVA, HTN, HFrEF, CAD, CKD2, DM2, hyperlipidemia. A1c is well controlled. Lipids are at goal. She is taking ozempic to aid with weight loss/blood sugar control. She has maintained her weight since starting with our program. She continues to attend the [facility] education and nutrition series regularly. Patient will benefit from participation in intensive cardiac rehab for nutrition, exercise, and lifestyle modification.          Psychosocial: Target Goals: Acknowledge presence or absence of significant depression and/or stress, maximize coping skills, provide positive support system. Participant is able to verbalize types and ability to use techniques and skills needed for reducing stress and depression.  Initial Review & Psychosocial Screening:  Initial Psych Review & Screening - 11/20/23 0835       Initial Review   Current issues with None Identified      Family Dynamics   Good Support System? Yes   Her church family is her support     Barriers   Psychosocial barriers to participate in program The patient should benefit from training in stress management and relaxation.      Screening Interventions   Interventions Encouraged to exercise;To provide support and resources with identified psychosocial needs;Provide feedback about the scores to participant    Expected Outcomes Long Term Goal: Stressors or current issues are controlled or eliminated.;Short Term goal: Identification and review with participant of any Quality of Life or Depression concerns found by scoring the questionnaire.;Long Term goal: The participant improves quality of Life and  PHQ9 Scores as seen by post scores and/or verbalization of changes          Quality of Life Scores:  Quality of Life - 11/20/23 1416       Quality of Life   Select Quality of Life      Quality of Life Scores   Health/Function Pre 28.36 %    Socioeconomic Pre 27.6 %    Psych/Spiritual Pre 30 %    Family Pre 24 %    GLOBAL Pre 28 %         Scores of 19 and below usually  indicate a poorer quality of life in these areas.  A difference of  2-3 points is a clinically meaningful difference.  A difference of 2-3 points in the total score of the Quality of Life Index has been associated with significant improvement in overall quality of life, self-image, physical symptoms, and general health in studies assessing change in quality of life.  PHQ-9: Review Flowsheet       11/20/2023  Depression screen PHQ 2/9  Decreased Interest 0  Down, Depressed, Hopeless 0  PHQ - 2 Score 0  Altered sleeping 0  Tired, decreased energy 0  Change in appetite 0  Feeling bad or failure about yourself  0  Trouble concentrating 0  Moving slowly or fidgety/restless 0  Suicidal thoughts 0  PHQ-9 Score 0  Difficult doing work/chores --   Interpretation of Total Score  Total Score Depression Severity:  1-4 = Minimal depression, 5-9 = Mild depression, 10-14 = Moderate depression, 15-19 = Moderately severe depression, 20-27 = Severe depression   Psychosocial Evaluation and Intervention:   Psychosocial Re-Evaluation:  Psychosocial Re-Evaluation     Row Name 12/04/23 1124 01/02/24 1319 01/29/24 0801         Psychosocial Re-Evaluation   Current issues with None Identified None Identified Current Stress Concerns     Comments -- -- Tamaira did voice some concerns regarding her heart failure diagnosis. Emotional support provided     Expected Outcomes -- -- Reema will have controlled or decreased stress/ anxiety upon completion of cardiac rehab     Interventions Encouraged to attend Cardiac  Rehabilitation for the exercise Encouraged to attend Cardiac Rehabilitation for the exercise Encouraged to attend Cardiac Rehabilitation for the exercise     Continue Psychosocial Services  No Follow up required No Follow up required No Follow up required       Initial Review   Source of Stress Concerns -- -- Chronic Illness     Comments -- -- Will continue to monitor and offer support as needed        Psychosocial Discharge (Final Psychosocial Re-Evaluation):  Psychosocial Re-Evaluation - 01/29/24 0801       Psychosocial Re-Evaluation   Current issues with Current Stress Concerns    Comments Prisca did voice some concerns regarding her heart failure diagnosis. Emotional support provided    Expected Outcomes Aloura will have controlled or decreased stress/ anxiety upon completion of cardiac rehab    Interventions Encouraged to attend Cardiac Rehabilitation for the exercise    Continue Psychosocial Services  No Follow up required      Initial Review   Source of Stress Concerns Chronic Illness    Comments Will continue to monitor and offer support as needed          Vocational Rehabilitation: Provide vocational rehab assistance to qualifying candidates.   Vocational Rehab Evaluation & Intervention:  Vocational Rehab - 11/20/23 1420       Initial Vocational Rehab Evaluation & Intervention   Assessment shows need for Vocational Rehabilitation No   no needs, Pt is retired         Education: Education Goals: Education classes will be provided on a weekly basis, covering required topics. Participant will state understanding/return demonstration of topics presented.    Education     Row Name 11/24/23 0800     Education   Cardiac Education Topics Pritikin   Select Core Videos     Core Videos   Educator Exercise Physiologist   Select Nutrition   Nutrition  Fueling a Forensic psychologist   Instruction Review Code 1- Verbalizes Understanding   Class Start Time 0815   Class  Stop Time 0900   Class Time Calculation (min) 45 min    Row Name 11/26/23 0900     Education   Cardiac Education Topics Pritikin   Secondary school teacher School   Educator Dietitian   Weekly Topic International Cuisine- Spotlight on the [facility] Zones   Instruction Review Code 1- Verbalizes Understanding   Class Start Time 0815   Class Stop Time 0850   Class Time Calculation (min) 35 min    Row Name 12/01/23 0900     Education   Cardiac Education Topics Pritikin   Select Workshops     Workshops   Educator Exercise Physiologist   Select Psychosocial   Psychosocial Workshop Healthy Sleep for a Healthy Heart   Instruction Review Code 1- Verbalizes Understanding   Class Start Time [phone]   Class Stop Time 0858   Class Time Calculation (min) 46 min    Row Name 12/03/23 0900     Education   Cardiac Education Topics Pritikin   Secondary school teacher School   Educator Dietitian   Weekly Topic Simple Sides and Sauces   Instruction Review Code 1- Verbalizes Understanding   Class Start Time 0815   Class Stop Time 0855   Class Time Calculation (min) 40 min    Row Name 12/05/23 0900     Education   Cardiac Education Topics Pritikin   Select Core Videos     Core Videos   Educator Exercise Physiologist   Select Psychosocial   Psychosocial How Our Thoughts Can Heal Our Hearts   Instruction Review Code 1- Verbalizes Understanding   Class Start Time [phone]   Class Stop Time 0848   Class Time Calculation (min) 36 min    Row Name 12/10/23 0900     Education   Cardiac Education Topics Pritikin   Secondary school teacher School   Educator Dietitian   Weekly Topic Powerhouse Plant-Based Proteins   Instruction Review Code 1- Verbalizes Understanding   Class Start Time 0815   Class Stop Time 0855   Class Time Calculation (min) 40 min    Row Name 12/12/23 0800     Education   Cardiac Education Topics Pritikin   Regulatory affairs officer General Education   General Education Hypertension and Heart Disease   Instruction Review Code 1Idamae Maize Understanding    Row Name 12/12/23 1300     Education   Cardiac Education Topics Pritikin   Psychologist, forensic General Education   General Education Hypertension and Heart Disease   Instruction Review Code 1- Verbalizes Understanding   Class Start Time [phone]   Class Stop Time 0847   Class Time Calculation (min) 35 min    Row Name 12/15/23 0800     Education   Cardiac Education Topics Pritikin   Geographical information systems officer Psychosocial   Psychosocial Workshop From Head to Heart: The Power of a Healthy Outlook   Instruction Review Code 1- Verbalizes Understanding   Class Start Time 0815   Class Stop Time 0900   Class Time Calculation (min) 45  min    Row Name 12/17/23 0900     Education   Cardiac Education Topics Pritikin   Secondary school teacher School   Educator Dietitian   Weekly Topic Tasty Appetizers and Snacks   Instruction Review Code 1- Verbalizes Understanding   Class Start Time 0815   Class Stop Time 0855   Class Time Calculation (min) 40 min    Row Name 12/19/23 0800     Education   Cardiac Education Topics Pritikin   Psychologist, forensic General Education   General Education Heart Disease Risk Reduction   Instruction Review Code 1- Verbalizes Understanding   Class Start Time 0813   Class Stop Time 0850   Class Time Calculation (min) 37 min    Row Name 12/22/23 0900     Education   Cardiac Education Topics Pritikin   Select Core Videos     Core Videos   Educator Exercise Physiologist   Select Psychosocial   Psychosocial Healthy Minds, Bodies, Hearts   Instruction Review Code 1- Verbalizes Understanding   Class  Start Time 0815   Class Stop Time 0850   Class Time Calculation (min) 35 min    Row Name 12/24/23 0800     Education   Cardiac Education Topics Pritikin   Secondary school teacher School   Educator Dietitian   Weekly Topic Adding Flavor - Sodium-Free   Instruction Review Code 1- Verbalizes Understanding   Class Start Time 0815   Class Stop Time 0855   Class Time Calculation (min) 40 min    Row Name 12/26/23 0900     Education   Cardiac Education Topics Pritikin   Select Workshops     Workshops   Educator Exercise Physiologist   Select Exercise   Exercise Workshop Location manager and Fall Prevention   Instruction Review Code 1- Verbalizes Understanding   Class Start Time 0810   Class Stop Time 0850   Class Time Calculation (min) 40 min    Row Name 12/29/23 0900     Education   Cardiac Education Topics Pritikin   Glass blower/designer Nutrition   Nutrition Workshop Label Reading   Instruction Review Code 1- Verbalizes Understanding   Class Start Time 0815   Class Stop Time 0900   Class Time Calculation (min) 45 min    Row Name 12/31/23 0900     Education   Cardiac Education Topics Pritikin   Select Core Videos     Core Videos   Educator Exercise Physiologist   Select Nutrition   Nutrition Other  Label reading   Instruction Review Code 1- Verbalizes Understanding   Class Start Time 0813   Class Stop Time 0844   Class Time Calculation (min) 31 min    Row Name 01/02/24 0700     Education   Cardiac Education Topics Pritikin   Secondary school teacher School   Educator Dietitian   Weekly Topic Fast and Healthy Breakfasts   Instruction Review Code 1- Verbalizes Understanding   Class Start Time 0815   Class Stop Time 0855   Class Time Calculation (min) 40 min    Row Name 01/05/24 0700     Education   Cardiac Education Topics Pritikin   Select Core Videos     Core  Videos   Educator  Exercise Industrial/product designer Education   General Education Metabolic Syndrome and Belly Fat   Instruction Review Code 1- Verbalizes Understanding   Class Start Time 0815   Class Stop Time 0851   Class Time Calculation (min) 36 min    Row Name 01/07/24 1000     Education   Cardiac Education Topics Pritikin   Secondary school teacher School   Educator Dietitian   Weekly Topic Personalizing Your Pritikin Plate   Instruction Review Code 1- Verbalizes Understanding   Class Start Time 0815   Class Stop Time 0850   Class Time Calculation (min) 35 min    Row Name 01/09/24 0800     Education   Cardiac Education Topics Pritikin   Select Core Videos     Core Videos   Educator Dietitian   Select Nutrition   Nutrition Overview of the Pritikin Eating Plan   Instruction Review Code 1- Verbalizes Understanding   Class Start Time 0815   Class Stop Time 0900   Class Time Calculation (min) 45 min    Row Name 01/16/24 0800     Education   Cardiac Education Topics Pritikin   Select Core Videos     Core Videos   Educator Dietitian   Select Nutrition   Nutrition Calorie Density   Instruction Review Code 1- Verbalizes Understanding   Class Start Time 0815   Class Stop Time 0855   Class Time Calculation (min) 40 min    Row Name 01/21/24 0900     Education   Cardiac Education Topics Pritikin   Secondary school teacher School   Educator Dietitian   Weekly Topic Efficiency Cooking - Meals in a Snap   Instruction Review Code 1- Verbalizes Understanding   Class Start Time 0815   Class Stop Time 0850   Class Time Calculation (min) 35 min    Row Name 01/23/24 0900     Education   Cardiac Education Topics Pritikin   Psychologist, forensic Exercise Education   Exercise Education Move It!   Instruction Review Code 1- Verbalizes Understanding   Class Start Time [phone]   Class Stop Time 0845    Class Time Calculation (min) 35 min    Row Name 01/26/24 0800     Education   Cardiac Education Topics Pritikin   Glass blower/designer Nutrition   Nutrition Workshop Targeting Your Nutrition Priorities   Instruction Review Code 1- Verbalizes Understanding   Class Start Time 0815   Class Stop Time 0855   Class Time Calculation (min) 40 min    Row Name 01/28/24 0800     Education   Cardiac Education Topics Pritikin   Secondary school teacher School   Educator Dietitian   Weekly Topic One-Pot Wonders   Instruction Review Code 1- Verbalizes Understanding   Class Start Time 0815   Class Stop Time 0850   Class Time Calculation (min) 35 min    Row Name 01/30/24 0800     Education   Cardiac Education Topics Pritikin   Hospital doctor Education   General Education Heart Disease Risk Reduction   Instruction Review Code 1- [employer]  Understanding   Class Start Time 0812   Class Stop Time 0847   Class Time Calculation (min) 35 min    Row Name 02/02/24 0800     Education   Cardiac Education Topics Pritikin   Select Workshops     Workshops   Educator Exercise Physiologist   Select Psychosocial   Psychosocial Workshop Focused Goals, Sustainable Changes   Instruction Review Code 1- Verbalizes Understanding   Class Start Time 0815   Class Stop Time 0850   Class Time Calculation (min) 35 min      Core Videos: Exercise    Move It!  Clinical staff conducted group or individual video education with verbal and written material and guidebook.  Patient learns the recommended Pritikin exercise program. Exercise with the goal of living a long, healthy life. Some of the health benefits of exercise include controlled diabetes, healthier blood pressure levels, improved cholesterol levels, improved heart and lung capacity, improved sleep, and better body  composition. Everyone should speak with their doctor before starting or changing an exercise routine.  Biomechanical Limitations Clinical staff conducted group or individual video education with verbal and written material and guidebook.  Patient learns how biomechanical limitations can impact exercise and how we can mitigate and possibly overcome limitations to have an impactful and balanced exercise routine.  Body Composition Clinical staff conducted group or individual video education with verbal and written material and guidebook.  Patient learns that body composition (ratio of muscle mass to fat mass) is a key component to assessing overall fitness, rather than body weight alone. Increased fat mass, especially visceral belly fat, can put us  at increased risk for metabolic syndrome, type 2 diabetes, heart disease, and even death. It is recommended to combine diet and exercise (cardiovascular and resistance training) to improve your body composition. Seek guidance from your physician and exercise physiologist before implementing an exercise routine.  Exercise Action Plan Clinical staff conducted group or individual video education with verbal and written material and guidebook.  Patient learns the recommended strategies to achieve and enjoy long-term exercise adherence, including variety, self-motivation, self-efficacy, and positive decision making. Benefits of exercise include fitness, good health, weight management, more energy, better sleep, less stress, and overall well-being.  Medical   Heart Disease Risk Reduction Clinical staff conducted group or individual video education with verbal and written material and guidebook.  Patient learns our heart is our most vital organ as it circulates oxygen, nutrients, white blood cells, and hormones throughout the entire body, and carries waste away. Data supports a plant-based eating plan like the Pritikin Program for its effectiveness in slowing  progression of and reversing heart disease. The video provides a number of recommendations to address heart disease.   Metabolic Syndrome and Belly Fat  Clinical staff conducted group or individual video education with verbal and written material and guidebook.  Patient learns what metabolic syndrome is, how it leads to heart disease, and how one can reverse it and keep it from coming back. You have metabolic syndrome if you have 3 of the following 5 criteria: abdominal obesity, high blood pressure, high triglycerides, low HDL cholesterol, and high blood sugar.  Hypertension and Heart Disease Clinical staff conducted group or individual video education with verbal and written material and guidebook.  Patient learns that high blood pressure, or hypertension, is very common in the [place] . Hypertension is largely due to excessive salt intake, but other important risk factors include being overweight, physical inactivity, drinking too much alcohol, smoking,  and not eating enough potassium from fruits and vegetables. High blood pressure is a leading risk factor for heart attack, stroke, congestive heart failure, dementia, kidney failure, and premature death. Long-term effects of excessive salt intake include stiffening of the arteries and thickening of heart muscle and organ damage. Recommendations include ways to reduce hypertension and the risk of heart disease.  Diseases of Our Time - Focusing on Diabetes Clinical staff conducted group or individual video education with verbal and written material and guidebook.  Patient learns why the best way to stop diseases of our time is prevention, through food and other lifestyle changes. Medicine (such as prescription pills and surgeries) is often only a Band-Aid on the problem, not a long-term solution. Most common diseases of our time include obesity, type 2 diabetes, hypertension, heart disease, and cancer. The Pritikin Program is recommended and has been  proven to help reduce, reverse, and/or prevent the damaging effects of metabolic syndrome.  Nutrition   Overview of the Pritikin Eating Plan  Clinical staff conducted group or individual video education with verbal and written material and guidebook.  Patient learns about the Pritikin Eating Plan for disease risk reduction. The Pritikin Eating Plan emphasizes a wide variety of unrefined, minimally-processed carbohydrates, like fruits, vegetables, whole grains, and legumes. Go, Caution, and Stop food choices are explained. Plant-based and lean animal proteins are emphasized. Rationale provided for low sodium intake for blood pressure control, low added sugars for blood sugar stabilization, and low added fats and oils for coronary artery disease risk reduction and weight management.  Calorie Density  Clinical staff conducted group or individual video education with verbal and written material and guidebook.  Patient learns about calorie density and how it impacts the Pritikin Eating Plan. Knowing the characteristics of the food you choose will help you decide whether those foods will lead to weight gain or weight loss, and whether you want to consume more or less of them. Weight loss is usually a side effect of the Pritikin Eating Plan because of its focus on low calorie-dense foods.  Label Reading  Clinical staff conducted group or individual video education with verbal and written material and guidebook.  Patient learns about the Pritikin recommended label reading guidelines and corresponding recommendations regarding calorie density, added sugars, sodium content, and whole grains.  Dining Out - Part 1  Clinical staff conducted group or individual video education with verbal and written material and guidebook.  Patient learns that restaurant meals can be sabotaging because they can be so high in calories, fat, sodium, and/or sugar. Patient learns recommended strategies on how to positively address  this and avoid unhealthy pitfalls.  Facts on Fats  Clinical staff conducted group or individual video education with verbal and written material and guidebook.  Patient learns that lifestyle modifications can be just as effective, if not more so, as many medications for lowering your risk of heart disease. A Pritikin lifestyle can help to reduce your risk of inflammation and atherosclerosis (cholesterol build-up, or plaque, in the artery walls). Lifestyle interventions such as dietary choices and physical activity address the cause of atherosclerosis. A review of the types of fats and their impact on blood cholesterol levels, along with dietary recommendations to reduce fat intake is also included.  Nutrition Action Plan  Clinical staff conducted group or individual video education with verbal and written material and guidebook.  Patient learns how to incorporate Pritikin recommendations into their lifestyle. Recommendations include planning and keeping personal health goals in mind  as an important part of their success.  Healthy Mind-Set    Healthy Minds, Bodies, Hearts  Clinical staff conducted group or individual video education with verbal and written material and guidebook.  Patient learns how to identify when they are stressed. Video will discuss the impact of that stress, as well as the many benefits of stress management. Patient will also be introduced to stress management techniques. The way we think, act, and feel has an impact on our hearts.  How Our Thoughts Can Heal Our Hearts  Clinical staff conducted group or individual video education with verbal and written material and guidebook.  Patient learns that negative thoughts can cause depression and anxiety. This can result in negative lifestyle behavior and serious health problems. Cognitive behavioral therapy is an effective method to help control our thoughts in order to change and improve our emotional outlook.  Additional  Videos:  Exercise    Improving Performance  Clinical staff conducted group or individual video education with verbal and written material and guidebook.  Patient learns to use a non-linear approach by alternating intensity levels and lengths of time spent exercising to help burn more calories and lose more body fat. Cardiovascular exercise helps improve heart health, metabolism, hormonal balance, blood sugar control, and recovery from fatigue. Resistance training improves strength, endurance, balance, coordination, reaction time, metabolism, and muscle mass. Flexibility exercise improves circulation, posture, and balance. Seek guidance from your physician and exercise physiologist before implementing an exercise routine and learn your capabilities and proper form for all exercise.  Introduction to Yoga  Clinical staff conducted group or individual video education with verbal and written material and guidebook.  Patient learns about yoga, a discipline of the coming together of mind, breath, and body. The benefits of yoga include improved flexibility, improved range of motion, better posture and core strength, increased lung function, weight loss, and positive self-image. Yoga's heart health benefits include lowered blood pressure, healthier heart rate, decreased cholesterol and triglyceride levels, improved immune function, and reduced stress. Seek guidance from your physician and exercise physiologist before implementing an exercise routine and learn your capabilities and proper form for all exercise.  Medical   Aging: Enhancing Your Quality of Life  Clinical staff conducted group or individual video education with verbal and written material and guidebook.  Patient learns key strategies and recommendations to stay in good physical health and enhance quality of life, such as prevention strategies, having an advocate, securing a Health Care Proxy and Power of Attorney, and keeping a list of medications  and system for tracking them. It also discusses how to avoid risk for bone loss.  Biology of Weight Control  Clinical staff conducted group or individual video education with verbal and written material and guidebook.  Patient learns that weight gain occurs because we consume more calories than we burn (eating more, moving less). Even if your body weight is normal, you may have higher ratios of fat compared to muscle mass. Too much body fat puts you at increased risk for cardiovascular disease, heart attack, stroke, type 2 diabetes, and obesity-related cancers. In addition to exercise, following the Pritikin Eating Plan can help reduce your risk.  Decoding Lab Results  Clinical staff conducted group or individual video education with verbal and written material and guidebook.  Patient learns that lab test reflects one measurement whose values change over time and are influenced by many factors, including medication, stress, sleep, exercise, food, hydration, pre-existing medical conditions, and more. It is recommended to use  the knowledge from this video to become more involved with your lab results and evaluate your numbers to speak with your doctor.   Diseases of Our Time - Overview  Clinical staff conducted group or individual video education with verbal and written material and guidebook.  Patient learns that according to the CDC, 50% to 70% of chronic diseases (such as obesity, type 2 diabetes, elevated lipids, hypertension, and heart disease) are avoidable through lifestyle improvements including healthier food choices, listening to satiety cues, and increased physical activity.  Sleep Disorders Clinical staff conducted group or individual video education with verbal and written material and guidebook.  Patient learns how good quality and duration of sleep are important to overall health and well-being. Patient also learns about sleep disorders and how they impact health along with  recommendations to address them, including discussing with a physician.  Nutrition  Dining Out - Part 2 Clinical staff conducted group or individual video education with verbal and written material and guidebook.  Patient learns how to plan ahead and communicate in order to maximize their dining experience in a healthy and nutritious manner. Included are recommended food choices based on the type of restaurant the patient is visiting.   Fueling a Banker conducted group or individual video education with verbal and written material and guidebook.  There is a strong connection between our food choices and our health. Diseases like obesity and type 2 diabetes are very prevalent and are in large-part due to lifestyle choices. The Pritikin Eating Plan provides plenty of food and hunger-curbing satisfaction. It is easy to follow, affordable, and helps reduce health risks.  Menu Workshop  Clinical staff conducted group or individual video education with verbal and written material and guidebook.  Patient learns that restaurant meals can sabotage health goals because they are often packed with calories, fat, sodium, and sugar. Recommendations include strategies to plan ahead and to communicate with the manager, chef, or server to help order a healthier meal.  Planning Your Eating Strategy  Clinical staff conducted group or individual video education with verbal and written material and guidebook.  Patient learns about the Pritikin Eating Plan and its benefit of reducing the risk of disease. The Pritikin Eating Plan does not focus on calories. Instead, it emphasizes high-quality, nutrient-rich foods. By knowing the characteristics of the foods, we choose, we can determine their calorie density and make informed decisions.  Targeting Your Nutrition Priorities  Clinical staff conducted group or individual video education with verbal and written material and guidebook.  Patient learns  that lifestyle habits have a tremendous impact on disease risk and progression. This video provides eating and physical activity recommendations based on your personal health goals, such as reducing LDL cholesterol, losing weight, preventing or controlling type 2 diabetes, and reducing high blood pressure.  Vitamins and Minerals  Clinical staff conducted group or individual video education with verbal and written material and guidebook.  Patient learns different ways to obtain key vitamins and minerals, including through a recommended healthy diet. It is important to discuss all supplements you take with your doctor.   Healthy Mind-Set    Smoking Cessation  Clinical staff conducted group or individual video education with verbal and written material and guidebook.  Patient learns that cigarette smoking and tobacco addiction pose a serious health risk which affects millions of people. Stopping smoking will significantly reduce the risk of heart disease, lung disease, and many forms of cancer. Recommended strategies for quitting are covered,  including working with your doctor to develop a successful plan.  Culinary   Becoming a Set designer conducted group or individual video education with verbal and written material and guidebook.  Patient learns that cooking at home can be healthy, cost-effective, quick, and puts them in control. Keys to cooking healthy recipes will include looking at your recipe, assessing your equipment needs, planning ahead, making it simple, choosing cost-effective seasonal ingredients, and limiting the use of added fats, salts, and sugars.  Cooking - Breakfast and Snacks  Clinical staff conducted group or individual video education with verbal and written material and guidebook.  Patient learns how important breakfast is to satiety and nutrition through the entire day. Recommendations include key foods to eat during breakfast to help stabilize blood sugar  levels and to prevent overeating at meals later in the day. Planning ahead is also a key component.  Cooking - Educational psychologist conducted group or individual video education with verbal and written material and guidebook.  Patient learns eating strategies to improve overall health, including an approach to cook more at home. Recommendations include thinking of animal protein as a side on your plate rather than center stage and focusing instead on lower calorie dense options like vegetables, fruits, whole grains, and plant-based proteins, such as beans. Making sauces in large quantities to freeze for later and leaving the skin on your vegetables are also recommended to maximize your experience.  Cooking - Healthy Salads and Dressing Clinical staff conducted group or individual video education with verbal and written material and guidebook.  Patient learns that vegetables, fruits, whole grains, and legumes are the foundations of the Pritikin Eating Plan. Recommendations include how to incorporate each of these in flavorful and healthy salads, and how to create homemade salad dressings. Proper handling of ingredients is also covered. Cooking - Soups and [employer] - Soups and Desserts Clinical staff conducted group or individual video education with verbal and written material and guidebook.  Patient learns that Pritikin soups and desserts make for easy, nutritious, and delicious snacks and meal components that are low in sodium, fat, sugar, and calorie density, while high in vitamins, minerals, and filling fiber. Recommendations include simple and healthy ideas for soups and desserts.   Overview     The Pritikin Solution Program Overview Clinical staff conducted group or individual video education with verbal and written material and guidebook.  Patient learns that the results of the Pritikin Program have been documented in more than 100 articles published in peer-reviewed  journals, and the benefits include reducing risk factors for (and, in some cases, even reversing) high cholesterol, high blood pressure, type 2 diabetes, obesity, and more! An overview of the three key pillars of the Pritikin Program will be covered: eating well, doing regular exercise, and having a healthy mind-set.  WORKSHOPS  Exercise: Exercise Basics: Building Your Action Plan Clinical staff led group instruction and group discussion with PowerPoint presentation and patient guidebook. To enhance the learning environment the use of posters, models and videos may be added. At the conclusion of this workshop, patients will comprehend the difference between physical activity and exercise, as well as the benefits of incorporating both, into their routine. Patients will understand the FITT (Frequency, Intensity, Time, and Type) principle and how to use it to build an exercise action plan. In addition, safety concerns and other considerations for exercise and cardiac rehab will be addressed by the presenter. The purpose of this lesson  is to promote a comprehensive and effective weekly exercise routine in order to improve patients' overall level of fitness.   Managing Heart Disease: Your Path to a Healthier Heart Clinical staff led group instruction and group discussion with PowerPoint presentation and patient guidebook. To enhance the learning environment the use of posters, models and videos may be added.At the conclusion of this workshop, patients will understand the anatomy and physiology of the heart. Additionally, they will understand how Pritikin's three pillars impact the risk factors, the progression, and the management of heart disease.  The purpose of this lesson is to provide a high-level overview of the heart, heart disease, and how the Pritikin lifestyle positively impacts risk factors.  Exercise Biomechanics Clinical staff led group instruction and group discussion with PowerPoint  presentation and patient guidebook. To enhance the learning environment the use of posters, models and videos may be added. Patients will learn how the structural parts of their bodies function and how these functions impact their daily activities, movement, and exercise. Patients will learn how to promote a neutral spine, learn how to manage pain, and identify ways to improve their physical movement in order to promote healthy living. The purpose of this lesson is to expose patients to common physical limitations that impact physical activity. Participants will learn practical ways to adapt and manage aches and pains, and to minimize their effect on regular exercise. Patients will learn how to maintain good posture while sitting, walking, and lifting.  Balance Training and Fall Prevention  Clinical staff led group instruction and group discussion with PowerPoint presentation and patient guidebook. To enhance the learning environment the use of posters, models and videos may be added. At the conclusion of this workshop, patients will understand the importance of their sensorimotor skills (vision, proprioception, and the vestibular system) in maintaining their ability to balance as they age. Patients will apply a variety of balancing exercises that are appropriate for their current level of function. Patients will understand the common causes for poor balance, possible solutions to these problems, and ways to modify their physical environment in order to minimize their fall risk. The purpose of this lesson is to teach patients about the importance of maintaining balance as they age and ways to minimize their risk of falling.  WORKSHOPS   Nutrition:  Fueling a Ship broker led group instruction and group discussion with PowerPoint presentation and patient guidebook. To enhance the learning environment the use of posters, models and videos may be added. Patients will review the  foundational principles of the Pritikin Eating Plan and understand what constitutes a serving size in each of the food groups. Patients will also learn Pritikin-friendly foods that are better choices when away from home and review make-ahead meal and snack options. Calorie density will be reviewed and applied to three nutrition priorities: weight maintenance, weight loss, and weight gain. The purpose of this lesson is to reinforce (in a group setting) the key concepts around what patients are recommended to eat and how to apply these guidelines when away from home by planning and selecting Pritikin-friendly options. Patients will understand how calorie density may be adjusted for different weight management goals.  Mindful Eating  Clinical staff led group instruction and group discussion with PowerPoint presentation and patient guidebook. To enhance the learning environment the use of posters, models and videos may be added. Patients will briefly review the concepts of the Pritikin Eating Plan and the importance of low-calorie dense foods. The concept of mindful  eating will be introduced as well as the importance of paying attention to internal hunger signals. Triggers for non-hunger eating and techniques for dealing with triggers will be explored. The purpose of this lesson is to provide patients with the opportunity to review the basic principles of the Pritikin Eating Plan, discuss the value of eating mindfully and how to measure internal cues of hunger and fullness using the Hunger Scale. Patients will also discuss reasons for non-hunger eating and learn strategies to use for controlling emotional eating.  Targeting Your Nutrition Priorities Clinical staff led group instruction and group discussion with PowerPoint presentation and patient guidebook. To enhance the learning environment the use of posters, models and videos may be added. Patients will learn how to determine their genetic susceptibility to  disease by reviewing their family history. Patients will gain insight into the importance of diet as part of an overall healthy lifestyle in mitigating the impact of genetics and other environmental insults. The purpose of this lesson is to provide patients with the opportunity to assess their personal nutrition priorities by looking at their family history, their own health history and current risk factors. Patients will also be able to discuss ways of prioritizing and modifying the Pritikin Eating Plan for their highest risk areas  Menu  Clinical staff led group instruction and group discussion with PowerPoint presentation and patient guidebook. To enhance the learning environment the use of posters, models and videos may be added. Using menus brought in from [employer], or printed from [employer], patients will apply the Pritikin dining out guidelines that were presented in the [employer] video. Patients will also be able to practice these guidelines in a variety of provided scenarios. The purpose of this lesson is to provide patients with the opportunity to practice hands-on learning of the Pritikin Dining Out guidelines with actual menus and practice scenarios.  Label Reading Clinical staff led group instruction and group discussion with PowerPoint presentation and patient guidebook. To enhance the learning environment the use of posters, models and videos may be added. Patients will review and discuss the Pritikin label reading guidelines presented in Pritikin's Label Reading Educational series video. Using fool labels brought in from local grocery stores and markets, patients will apply the label reading guidelines and determine if the packaged food meet the Pritikin guidelines. The purpose of this lesson is to provide patients with the opportunity to review, discuss, and practice hands-on learning of the Pritikin Label Reading guidelines with actual packaged food  labels. Cooking School  Pritikin's [employer] are designed to teach patients ways to prepare quick, simple, and affordable recipes at home. The importance of nutrition's role in chronic disease risk reduction is reflected in its emphasis in the overall Pritikin program. By learning how to prepare essential core Pritikin Eating Plan recipes, patients will increase control over what they eat; be able to customize the flavor of foods without the use of added salt, sugar, or fat; and improve the quality of the food they consume. By learning a set of core recipes which are easily assembled, quickly prepared, and affordable, patients are more likely to prepare more healthy foods at home. These workshops focus on convenient breakfasts, simple entres, side dishes, and desserts which can be prepared with minimal effort and are consistent with nutrition recommendations for cardiovascular risk reduction. Cooking [employer] are taught by a Armed forces logistics/support/administrative officer (RD) who has been trained by the [employer]. The chef or RD  has a clear understanding of the importance of minimizing - if not completely eliminating - added fat, sugar, and sodium in recipes. Throughout the series of Cooking School Workshop sessions, patients will learn about healthy ingredients and efficient methods of cooking to build confidence in their capability to prepare    Cooking School weekly topics:  Adding Flavor- Sodium-Free  Fast and Healthy Breakfasts  Powerhouse Plant-Based Proteins  Satisfying Salads and Dressings  Simple Sides and Sauces  International Cuisine-Spotlight on the [employer] Zones  Delicious Desserts  Savory Soups  [employer] - Meals in a Astronomer Appetizers and Snacks  Comforting Weekend Breakfasts  One-Pot Wonders   Fast Evening Meals  Landscape architect Your Pritikin Plate  WORKSHOPS   Healthy Mindset (Psychosocial):  Focused Goals, Sustainable  Changes Clinical staff led group instruction and group discussion with PowerPoint presentation and patient guidebook. To enhance the learning environment the use of posters, models and videos may be added. Patients will be able to apply effective goal setting strategies to establish at least one personal goal, and then take consistent, meaningful action toward that goal. They will learn to identify common barriers to achieving personal goals and develop strategies to overcome them. Patients will also gain an understanding of how our mind-set can impact our ability to achieve goals and the importance of cultivating a positive and growth-oriented mind-set. The purpose of this lesson is to provide patients with a deeper understanding of how to set and achieve personal goals, as well as the tools and strategies needed to overcome common obstacles which may arise along the way.  From Head to Heart: The Power of a Healthy Outlook  Clinical staff led group instruction and group discussion with PowerPoint presentation and patient guidebook. To enhance the learning environment the use of posters, models and videos may be added. Patients will be able to recognize and describe the impact of emotions and mood on physical health. They will discover the importance of self-care and explore self-care practices which may work for them. Patients will also learn how to utilize the 4 C's to cultivate a healthier outlook and better manage stress and challenges. The purpose of this lesson is to demonstrate to patients how a healthy outlook is an essential part of maintaining good health, especially as they continue their cardiac rehab journey.  Healthy Sleep for a Healthy Heart Clinical staff led group instruction and group discussion with PowerPoint presentation and patient guidebook. To enhance the learning environment the use of posters, models and videos may be added. At the conclusion of this workshop, patients will be able  to demonstrate knowledge of the importance of sleep to overall health, well-being, and quality of life. They will understand the symptoms of, and treatments for, common sleep disorders. Patients will also be able to identify daytime and nighttime behaviors which impact sleep, and they will be able to apply these tools to help manage sleep-related challenges. The purpose of this lesson is to provide patients with a general overview of sleep and outline the importance of quality sleep. Patients will learn about a few of the most common sleep disorders. Patients will also be introduced to the concept of "sleep hygiene," and discover ways to self-manage certain sleeping problems through simple daily behavior changes. Finally, the workshop will motivate patients by clarifying the links between quality sleep and their goals of heart-healthy living.   Recognizing and Reducing Stress Clinical staff led group instruction and group discussion with PowerPoint presentation and patient  guidebook. To enhance the learning environment the use of posters, models and videos may be added. At the conclusion of this workshop, patients will be able to understand the types of stress reactions, differentiate between acute and chronic stress, and recognize the impact that chronic stress has on their health. They will also be able to apply different coping mechanisms, such as reframing negative self-talk. Patients will have the opportunity to practice a variety of stress management techniques, such as deep abdominal breathing, progressive muscle relaxation, and/or guided imagery.  The purpose of this lesson is to educate patients on the role of stress in their lives and to provide healthy techniques for coping with it.  Learning Barriers/Preferences:  Learning Barriers/Preferences - 11/20/23 1417       Learning Barriers/Preferences   Learning Barriers Sight;Exercise Concerns   poor balance, falls, pt wears reading glasses    Learning Preferences Audio;Computer/Internet;Group Instruction;Individual Instruction;Pictoral;Skilled Demonstration;Verbal Instruction;Video;Written Material          Education Topics:  Knowledge Questionnaire Score:  Knowledge Questionnaire Score - 11/20/23 1417       Knowledge Questionnaire Score   Pre Score 21/24          Core Components/Risk Factors/Patient Goals at Admission:  Personal Goals and Risk Factors at Admission - 11/20/23 1420       Core Components/Risk Factors/Patient Goals on Admission    Weight Management Yes;Weight Loss    Intervention Weight Management: Develop a combined nutrition and exercise program designed to reach desired caloric intake, while maintaining appropriate intake of nutrient and fiber, sodium and fats, and appropriate energy expenditure required for the weight goal.;Weight Management: Provide education and appropriate resources to help participant work on and attain dietary goals.;Weight Management/Obesity: Establish reasonable short term and long term weight goals.;Obesity: Provide education and appropriate resources to help participant work on and attain dietary goals.    Diabetes Yes    Intervention Provide education about signs/symptoms and action to take for hypo/hyperglycemia.;Provide education about proper nutrition, including hydration, and aerobic/resistive exercise prescription along with prescribed medications to achieve blood glucose in normal ranges: Fasting glucose 65-99 mg/dL    Expected Outcomes Short Term: Participant verbalizes understanding of the signs/symptoms and immediate care of hyper/hypoglycemia, proper foot care and importance of medication, aerobic/resistive exercise and nutrition plan for blood glucose control.;Long Term: Attainment of HbA1C < 7%.    Heart Failure Yes    Intervention Provide a combined exercise and nutrition program that is supplemented with education, support and counseling about heart failure. Directed  toward relieving symptoms such as shortness of breath, decreased exercise tolerance, and extremity edema.    Expected Outcomes Improve functional capacity of life;Short term: Attendance in program 2-3 days a week with increased exercise capacity. Reported lower sodium intake. Reported increased fruit and vegetable intake. Reports medication compliance.;Short term: Daily weights obtained and reported for increase. Utilizing diuretic protocols set by physician.;Long term: Adoption of self-care skills and reduction of barriers for early signs and symptoms recognition and intervention leading to self-care maintenance.    Hypertension Yes    Intervention Provide education on lifestyle modifcations including regular physical activity/exercise, weight management, moderate sodium restriction and increased consumption of fresh fruit, vegetables, and low fat dairy, alcohol moderation, and smoking cessation.;Monitor prescription use compliance.    Expected Outcomes Short Term: Continued assessment and intervention until BP is < 140/47mm HG in hypertensive participants. < 130/72mm HG in hypertensive participants with diabetes, heart failure or chronic kidney disease.;Long Term: Maintenance of blood pressure at goal levels.  Lipids Yes    Intervention Provide education and support for participant on nutrition & aerobic/resistive exercise along with prescribed medications to achieve LDL 70mg , HDL >40mg .    Expected Outcomes Short Term: Participant states understanding of desired cholesterol values and is compliant with medications prescribed. Participant is following exercise prescription and nutrition guidelines.;Long Term: Cholesterol controlled with medications as prescribed, with individualized exercise RX and with personalized nutrition plan. Value goals: LDL < 70mg , HDL > 40 mg.    Personal Goal Other Yes    Personal Goal Pt would like more flexibility, and more stabole when walking, increase in endurance     Intervention Will continue to monitor pt and progress workloads as tolerated without sign or symptom    Expected Outcomes Pt will achieve her goals and increase strength          Core Components/Risk Factors/Patient Goals Review:   Goals and Risk Factor Review     Row Name 12/04/23 1125 01/02/24 1320 01/29/24 0811         Core Components/Risk Factors/Patient Goals Review   Personal Goals Review Weight Management/Obesity;Heart Failure;Hypertension;Lipids;Diabetes Weight Management/Obesity;Heart Failure;Hypertension;Lipids;Diabetes Weight Management/Obesity;Heart Failure;Hypertension;Lipids;Diabetes     Review Molli started cardiac rehab on 11/24/23. Keera is off to a good start to exercise. Vital sings and CBG's have been stable. Athina is doing well with exercise at cardiac rehab. Vital signs and CBG's have been stable. Ernerstine has increased her met levels. Eboni is happy with her progress at cardiac rehab and feels better since she started the program. Takari continues to do well with exercise at cardiac rehab. Myrla will complete complete cardiac rehab tenatively at the end of the month. Vital signs have been stable. Some asymptomatic PVC's have been noted will continue to monitor.     Expected Outcomes Leshonda will continue to participate in cardiac rehab for exercise, nutrition and lifestyle modifications Mirakle will continue to participate in cardiac rehab for exercise, nutrition and lifestyle modifications Jasiah will continue to participate in cardiac rehab for exercise, nutrition and lifestyle modifications        Core Components/Risk Factors/Patient Goals at Discharge (Final Review):   Goals and Risk Factor Review - 01/29/24 0811       Core Components/Risk Factors/Patient Goals Review   Personal Goals Review Weight Management/Obesity;Heart Failure;Hypertension;Lipids;Diabetes    Review Lucas continues to do well with exercise at cardiac rehab.  Rigby will complete complete cardiac rehab tenatively at the end of the month. Vital signs have been stable. Some asymptomatic PVC's have been noted will continue to monitor.    Expected Outcomes Rogelio will continue to participate in cardiac rehab for exercise, nutrition and lifestyle modifications          ITP Comments:  ITP Comments     Row Name 11/20/23 7829 12/04/23 1123 01/02/24 1318 01/29/24 0801     ITP Comments Dr. Gaylyn Keas medical director. Introduction to pritikin education/intensive cardiac rehab. Initial orientation packet reviewed with patient. 30 Day ITP Review. Zurie started cardiac rehab on 12/03/23. Lilyauna is off to a good start to exercise. 30 Day ITP Review. Niasia has good attendance and partcipaiton with exercise at  cardiac rehab 30 Day ITP Review. Birdia continues to have  good attendance and partcipaiton with exercise at cardiac rehab       Comments: See ITP comment

## 2024-02-02 NOTE — Telephone Encounter (Signed)
 Called to confirm/remind patient of their appointment at the [facility] on 02/02/24.   Appointment:   [] Confirmed  [x] Left mess   [] No answer/No voice mail  [] VM Full/unable to leave message  [] Phone not in service  Patient reminded to bring all medications and/or complete list.  Confirmed patient has transportation. Gave directions, instructed to utilize valet parking.

## 2024-02-03 ENCOUNTER — Other Ambulatory Visit (HOSPITAL_COMMUNITY): Payer: Self-pay | Admitting: Cardiology

## 2024-02-03 ENCOUNTER — Encounter (HOSPITAL_COMMUNITY): Payer: Self-pay | Admitting: Cardiology

## 2024-02-03 ENCOUNTER — Ambulatory Visit (HOSPITAL_COMMUNITY)
Admission: RE | Admit: 2024-02-03 | Discharge: 2024-02-03 | Disposition: A | Discharge status: Home / Self Care | Source: Ambulatory Visit | Attending: Cardiology | Admitting: Cardiology

## 2024-02-03 VITALS — BP 138/72 | HR 82 | Ht 65.0 in | Wt 191.0 lb

## 2024-02-03 DIAGNOSIS — Z6831 Body mass index (BMI) 31.0-31.9, adult: Secondary | ICD-10-CM | POA: Diagnosis not present

## 2024-02-03 DIAGNOSIS — I493 Ventricular premature depolarization: Secondary | ICD-10-CM | POA: Insufficient documentation

## 2024-02-03 DIAGNOSIS — E669 Obesity, unspecified: Secondary | ICD-10-CM | POA: Diagnosis not present

## 2024-02-03 DIAGNOSIS — Z955 Presence of coronary angioplasty implant and graft: Secondary | ICD-10-CM | POA: Diagnosis not present

## 2024-02-03 DIAGNOSIS — I5022 Chronic systolic (congestive) heart failure: Secondary | ICD-10-CM | POA: Insufficient documentation

## 2024-02-03 DIAGNOSIS — I2510 Atherosclerotic heart disease of native coronary artery without angina pectoris: Secondary | ICD-10-CM | POA: Insufficient documentation

## 2024-02-03 DIAGNOSIS — Z8673 Personal history of transient ischemic attack (TIA), and cerebral infarction without residual deficits: Secondary | ICD-10-CM | POA: Insufficient documentation

## 2024-02-03 DIAGNOSIS — Z79899 Other long term (current) drug therapy: Secondary | ICD-10-CM | POA: Insufficient documentation

## 2024-02-03 DIAGNOSIS — I4729 Other ventricular tachycardia: Secondary | ICD-10-CM | POA: Diagnosis not present

## 2024-02-03 DIAGNOSIS — I255 Ischemic cardiomyopathy: Secondary | ICD-10-CM | POA: Diagnosis not present

## 2024-02-03 DIAGNOSIS — I472 Ventricular tachycardia, unspecified: Secondary | ICD-10-CM

## 2024-02-03 DIAGNOSIS — Z794 Long term (current) use of insulin: Secondary | ICD-10-CM | POA: Diagnosis not present

## 2024-02-03 DIAGNOSIS — E785 Hyperlipidemia, unspecified: Secondary | ICD-10-CM | POA: Diagnosis not present

## 2024-02-03 DIAGNOSIS — Z7984 Long term (current) use of oral hypoglycemic drugs: Secondary | ICD-10-CM | POA: Diagnosis not present

## 2024-02-03 DIAGNOSIS — E119 Type 2 diabetes mellitus without complications: Secondary | ICD-10-CM | POA: Insufficient documentation

## 2024-02-03 DIAGNOSIS — I110 Hypertensive heart disease with heart failure: Secondary | ICD-10-CM | POA: Diagnosis present

## 2024-02-03 DIAGNOSIS — I340 Nonrheumatic mitral (valve) insufficiency: Secondary | ICD-10-CM | POA: Insufficient documentation

## 2024-02-03 DIAGNOSIS — Z7902 Long term (current) use of antithrombotics/antiplatelets: Secondary | ICD-10-CM | POA: Insufficient documentation

## 2024-02-03 LAB — HEPATIC FUNCTION PANEL
ALT: 14 U/L (ref 0–44)
AST: 20 U/L (ref 15–41)
Albumin: 3.6 g/dL (ref 3.5–5.0)
Alkaline Phosphatase: 48 U/L (ref 38–126)
Bilirubin, Direct: <0.1 mg/dL (ref 0.0–0.2)
Total Bilirubin: 1 mg/dL (ref 0.0–1.2)
Total Protein: 6.9 g/dL (ref 6.5–8.1)

## 2024-02-03 LAB — TSH: TSH: 1.413 u[IU]/mL (ref 0.350–4.500)

## 2024-02-03 LAB — BASIC METABOLIC PANEL WITH GFR
Anion gap: 10 (ref 5–15)
BUN: 11 mg/dL (ref 8–23)
CO2: 24 mmol/L (ref 22–32)
Calcium: 9 mg/dL (ref 8.9–10.3)
Chloride: 107 mmol/L (ref 98–111)
Creatinine, Ser: 1.18 mg/dL — ABNORMAL HIGH (ref 0.44–1.00)
GFR, Estimated: 47 mL/min — ABNORMAL LOW (ref >60)
Glucose, Bld: 142 mg/dL — ABNORMAL HIGH (ref 70–99)
Potassium: 4.7 mmol/L (ref 3.5–5.1)
Sodium: 141 mmol/L (ref 135–145)

## 2024-02-03 LAB — BRAIN NATRIURETIC PEPTIDE: B Natriuretic Peptide: 293.6 pg/mL — ABNORMAL HIGH (ref 0.0–100.0)

## 2024-02-03 MED ORDER — CARVEDILOL 25 MG PO TABS: ORAL_TABLET | ORAL | Status: DC

## 2024-02-03 NOTE — Patient Instructions (Signed)
 Great to see you today!!!  Medication Changes:  CHANGE Carvedilol  to 12.5 mg (1/2 tab) in AM and 25 mg (1 tab) in PM  Lab Work:  Labs done today, your results will be available in MyChart, we will contact you for abnormal readings.   Testing/Procedures:  Your provider has recommended that  you wear a Zio Patch for 7 days.  This monitor will record your heart rhythm for our review.  IF you have any symptoms while wearing the monitor please press the button.  If you have any issues with the patch or you notice a red or orange light on it please call the company at [phone].  Once you remove the patch please mail it back to the company as soon as possible so we can get the results.   Your physician has requested that you have an echocardiogram. Echocardiography is a painless test that uses sound waves to create images of your heart. It provides your doctor with information about the size and shape of your heart and how well your heart's chambers and valves are working. This procedure takes approximately one hour. There are no restrictions for this procedure. Please do NOT wear cologne, perfume, aftershave, or lotions (deodorant is allowed). Please arrive 15 minutes prior to your appointment time.  Please note: We ask at that you not bring children with you during ultrasound (echo/ vascular) testing. Due to room size and safety concerns, children are not allowed in the ultrasound rooms during exams. Our front office staff cannot provide observation of children in our lobby area while testing is being conducted. An adult accompanying a patient to their appointment will only be allowed in the ultrasound room at the discretion of the ultrasound technician under special circumstances. We apologize for any inconvenience.  Special Instructions // Education:  Do the following things EVERYDAY: Weigh yourself in the morning before breakfast. Write it down and keep it in a log. Take your medicines  as prescribed Eat low salt foods--Limit salt (sodium) to 2000 mg per day.  Stay as active as you can everyday Limit all fluids for the day to less than 2 liters   Follow-Up in: 3 months with an echocardiogram   At the [facility], you and your health needs are our priority. We have a designated team specialized in the treatment of Heart Failure. This Care Team includes your primary Heart Failure Specialized Cardiologist (physician), Advanced Practice Providers (APPs- Physician Assistants and Nurse Practitioners), and Pharmacist who all work together to provide you with the care you need, when you need it.   You may see any of the following providers on your designated Care Team at your next follow up:  Dr. Jules Oar Dr. Peder Bourdon Dr. Alwin Baars Dr. Judyth Nunnery Nieves Bars, NP Ruddy Corral, [place] [facility] [place], [place] Dennise Fitz, NP [place] Lee, NP Luster Salters, PharmD   Please be sure to bring in all your medications bottles to every appointment.   Need to Contact Us :  If you have any questions or concerns before your next appointment please send us  a message through [place] or call our office at [phone].    TO LEAVE A MESSAGE FOR THE NURSE SELECT OPTION 2, PLEASE LEAVE A MESSAGE INCLUDING: YOUR NAME DATE OF BIRTH CALL BACK NUMBER REASON FOR CALL**this is important as we prioritize the call backs  YOU WILL RECEIVE A CALL BACK THE SAME DAY AS LONG AS YOU CALL BEFORE 4:00 PM

## 2024-02-04 ENCOUNTER — Encounter (HOSPITAL_COMMUNITY): Admitting: Cardiology

## 2024-02-04 ENCOUNTER — Encounter (HOSPITAL_COMMUNITY)

## 2024-02-04 ENCOUNTER — Encounter (HOSPITAL_COMMUNITY)
Admission: RE | Admit: 2024-02-04 | Discharge: 2024-02-04 | Disposition: A | Discharge status: Home / Self Care | Source: Ambulatory Visit | Attending: Cardiology | Admitting: Cardiology

## 2024-02-04 DIAGNOSIS — I5042 Chronic combined systolic (congestive) and diastolic (congestive) heart failure: Secondary | ICD-10-CM | POA: Diagnosis not present

## 2024-02-06 ENCOUNTER — Encounter (HOSPITAL_COMMUNITY)
Admission: RE | Admit: 2024-02-06 | Discharge: 2024-02-06 | Disposition: A | Discharge status: Home / Self Care | Source: Ambulatory Visit | Attending: Cardiology | Admitting: Cardiology

## 2024-02-06 ENCOUNTER — Encounter (HOSPITAL_COMMUNITY)

## 2024-02-06 DIAGNOSIS — I5042 Chronic combined systolic (congestive) and diastolic (congestive) heart failure: Secondary | ICD-10-CM

## 2024-02-09 ENCOUNTER — Encounter (HOSPITAL_COMMUNITY)
Admission: RE | Admit: 2024-02-09 | Discharge: 2024-02-09 | Disposition: A | Discharge status: Home / Self Care | Source: Ambulatory Visit | Attending: Cardiology | Admitting: Cardiology

## 2024-02-09 ENCOUNTER — Encounter (HOSPITAL_COMMUNITY)

## 2024-02-09 DIAGNOSIS — I5042 Chronic combined systolic (congestive) and diastolic (congestive) heart failure: Secondary | ICD-10-CM

## 2024-02-10 ENCOUNTER — Ambulatory Visit: Admitting: Podiatry

## 2024-02-11 ENCOUNTER — Encounter (HOSPITAL_COMMUNITY)
Admission: RE | Admit: 2024-02-11 | Discharge: 2024-02-11 | Disposition: A | Discharge status: Home / Self Care | Source: Ambulatory Visit | Attending: Cardiology

## 2024-02-11 ENCOUNTER — Encounter (HOSPITAL_COMMUNITY)

## 2024-02-11 DIAGNOSIS — I5042 Chronic combined systolic (congestive) and diastolic (congestive) heart failure: Secondary | ICD-10-CM

## 2024-02-13 ENCOUNTER — Encounter (HOSPITAL_COMMUNITY)
Admission: RE | Admit: 2024-02-13 | Discharge: 2024-02-13 | Disposition: A | Discharge status: Home / Self Care | Source: Ambulatory Visit | Attending: Cardiology | Admitting: Cardiology

## 2024-02-13 VITALS — Ht 65.0 in | Wt 192.7 lb

## 2024-02-13 DIAGNOSIS — I5042 Chronic combined systolic (congestive) and diastolic (congestive) heart failure: Secondary | ICD-10-CM | POA: Diagnosis not present

## 2024-02-16 ENCOUNTER — Encounter (HOSPITAL_COMMUNITY)
Admission: RE | Admit: 2024-02-16 | Discharge: 2024-02-16 | Disposition: A | Discharge status: Home / Self Care | Source: Ambulatory Visit | Attending: Cardiology | Admitting: Cardiology

## 2024-02-16 DIAGNOSIS — I5042 Chronic combined systolic (congestive) and diastolic (congestive) heart failure: Secondary | ICD-10-CM | POA: Diagnosis not present

## 2024-02-16 NOTE — Progress Notes (Signed)
 Remote ICD transmission.

## 2024-02-16 NOTE — Addendum Note (Signed)
 Addended by: VICCI SELLER A on: 02/16/2024 09:17 AM   Modules accepted: Orders

## 2024-02-17 ENCOUNTER — Other Ambulatory Visit (HOSPITAL_COMMUNITY): Payer: Self-pay | Admitting: Pharmacist

## 2024-02-17 ENCOUNTER — Other Ambulatory Visit (HOSPITAL_COMMUNITY): Payer: Self-pay

## 2024-02-17 MED ORDER — NITROGLYCERIN 0.4 MG SL SUBL: 0.4000 mg | SUBLINGUAL_TABLET | SUBLINGUAL | 5 refills | Status: AC | PRN

## 2024-02-23 NOTE — Progress Notes (Signed)
 Discharge Progress Report  Patient Details  Name: Paula Wong MRN: [id number] Date of Birth: September 04, 1945 Referring Provider:   Flowsheet Row INTENSIVE CARDIAC REHAB ORIENT from 11/20/2023 in [facility]  Referring Provider Dr. Ria Commander DO     Number of Visits: 36 exercise sessions/35 education sessions (36 visits)  Reason for Discharge:  Patient reached a stable level of exercise. Patient independent in their exercise. Patient has met program and personal goals.  Smoking History:  Social History   Tobacco Use  Smoking Status Former   Current packs/day: 0.00   Types: Cigarettes   Quit date: 08/20/2003   Years since quitting: 20.5  Smokeless Tobacco Never    Diagnosis:  Heart failure, systolic and diastolic, chronic (HCC)  ADL [facility]:   Initial Exercise Prescription:  Initial Exercise Prescription - 11/20/23 1400       Date of Initial Exercise RX and Referring Provider   Date 11/20/23    Referring Provider Dr. Ria Commander DO    Expected Discharge Date 02/11/24      NuStep   Level 1    SPM 60    Minutes 15    METs 1.2      Prescription Details   Frequency (times per week) 3    Duration Progress to 30 minutes of continuous aerobic without signs/symptoms of physical distress      Intensity   THRR 40-80% of Max Heartrate 57-114    Ratings of Perceived Exertion 11-13    Perceived Dyspnea 0-4      Progression   Progression Continue progressive overload as per policy without signs/symptoms or physical distress.      Resistance Training   Training Prescription Yes    Weight 2    Reps 10-15          Discharge Exercise Prescription (Final Exercise Prescription Changes):  Exercise Prescription Changes - 02/16/24 0814       Response to Exercise   Blood Pressure (Admit) 104/64    Blood Pressure (Exit) 108/62    Heart Rate (Admit) 86 bpm    Heart Rate (Exercise) 100 bpm    Heart Rate  (Exit) 91 bpm    Rating of Perceived Exertion (Exercise) 9    Perceived Dyspnea (Exercise) 0    Symptoms none    Comments Pt graduated the [employer] program    Duration Continue with 30 min of aerobic exercise without signs/symptoms of physical distress.    Intensity THRR unchanged      Progression   Progression Continue to progress workloads to maintain intensity without signs/symptoms of physical distress.    Average METs 3.55      Resistance Training   Training Prescription Yes    Weight 3    Reps 10-15    Time 10 Minutes      Track   Laps 20    Minutes 30    METs 3.55      Home Exercise Plan   Plans to continue exercise at Home (comment)    Frequency Add 2 additional days to program exercise sessions.    Initial Home Exercises Provided 12/08/23          Functional Capacity:  6 Minute Walk     Row Name 11/20/23 1356 02/13/24 0839       6 Minute Walk   Phase Initial Discharge    Distance 750 feet 1275 feet    Distance % Change -- 70 %  Distance Feet Change -- 525 ft    Walk Time 6 minutes 6 minutes    # of Rest Breaks 1  seated rest break for fatigue. from 3.00-1.19 0    MPH 1.42 2.41    METS 1.02 2.09    RPE 11 9    Perceived Dyspnea  0 0    VO2 Peak 3.56 7.3    Symptoms Yes (comment) No    Comments Asymptomatic: low BP post walk at 82/57, H2o recheck seated 94/60 and standing: 104/62 --    Resting HR 81 bpm 87 bpm    Resting BP 108/62 108/64    Resting Oxygen Saturation  94 % --    Exercise Oxygen Saturation  during 6 min walk 98 % --    Max Ex. HR 106 bpm 102 bpm    Max Ex. BP 110/64 122/60    2 Minute Post BP 82/57  recheck 94/60 both asymptomatic --       Psychological, QOL, Others - Outcomes: PHQ 2/9:    02/13/2024    7:51 AM 11/20/2023   10:10 AM  Depression screen PHQ 2/9  Decreased Interest 0 0  Down, Depressed, Hopeless 0 0  PHQ - 2 Score 0 0  Altered sleeping 0 0  Tired, decreased energy 0 0  Change in appetite 0 0  Feeling bad  or failure about yourself  0 0  Trouble concentrating 0 0  Moving slowly or fidgety/restless 0 0  Suicidal thoughts 0 0  PHQ-9 Score 0 0  Difficult doing work/chores  --    Quality of Life:  Quality of Life - 02/16/24 0835       Quality of Life   Select Quality of Life      Quality of Life Scores   Health/Function Post 24.73 %    Socioeconomic Post 24.9 %    Psych/Spiritual Post 24.79 %    Family Post 21.88 %    GLOBAL Post 24.38 %          Personal Goals: Goals established at orientation with interventions provided to work toward goal.  Personal Goals and Risk Factors at Admission - 11/20/23 1420       Core Components/Risk Factors/Patient Goals on Admission    Weight Management Yes;Weight Loss    Intervention Weight Management: Develop a combined nutrition and exercise program designed to reach desired caloric intake, while maintaining appropriate intake of nutrient and fiber, sodium and fats, and appropriate energy expenditure required for the weight goal.;Weight Management: Provide education and appropriate resources to help participant work on and attain dietary goals.;Weight Management/Obesity: Establish reasonable short term and long term weight goals.;Obesity: Provide education and appropriate resources to help participant work on and attain dietary goals.    Diabetes Yes    Intervention Provide education about signs/symptoms and action to take for hypo/hyperglycemia.;Provide education about proper nutrition, including hydration, and aerobic/resistive exercise prescription along with prescribed medications to achieve blood glucose in normal ranges: Fasting glucose 65-99 mg/dL    Expected Outcomes Short Term: Participant verbalizes understanding of the signs/symptoms and immediate care of hyper/hypoglycemia, proper foot care and importance of medication, aerobic/resistive exercise and nutrition plan for blood glucose control.;Long Term: Attainment of HbA1C < 7%.    Heart  Failure Yes    Intervention Provide a combined exercise and nutrition program that is supplemented with education, support and counseling about heart failure. Directed toward relieving symptoms such as shortness of breath, decreased exercise tolerance, and extremity edema.  Expected Outcomes Improve functional capacity of life;Short term: Attendance in program 2-3 days a week with increased exercise capacity. Reported lower sodium intake. Reported increased fruit and vegetable intake. Reports medication compliance.;Short term: Daily weights obtained and reported for increase. Utilizing diuretic protocols set by physician.;Long term: Adoption of self-care skills and reduction of barriers for early signs and symptoms recognition and intervention leading to self-care maintenance.    Hypertension Yes    Intervention Provide education on lifestyle modifcations including regular physical activity/exercise, weight management, moderate sodium restriction and increased consumption of fresh fruit, vegetables, and low fat dairy, alcohol moderation, and smoking cessation.;Monitor prescription use compliance.    Expected Outcomes Short Term: Continued assessment and intervention until BP is < 140/55mm HG in hypertensive participants. < 130/75mm HG in hypertensive participants with diabetes, heart failure or chronic kidney disease.;Long Term: Maintenance of blood pressure at goal levels.    Lipids Yes    Intervention Provide education and support for participant on nutrition & aerobic/resistive exercise along with prescribed medications to achieve LDL 70mg , HDL >40mg .    Expected Outcomes Short Term: Participant states understanding of desired cholesterol values and is compliant with medications prescribed. Participant is following exercise prescription and nutrition guidelines.;Long Term: Cholesterol controlled with medications as prescribed, with individualized exercise RX and with personalized nutrition plan. Value  goals: LDL < 70mg , HDL > 40 mg.    Personal Goal Other Yes    Personal Goal Pt would like more flexibility, and more stabole when walking, increase in endurance    Intervention Will continue to monitor pt and progress workloads as tolerated without sign or symptom    Expected Outcomes Pt will achieve her goals and increase strength           Personal Goals Discharge:  Goals and Risk Factor Review     Row Name 12/04/23 1125 01/02/24 1320 01/29/24 0811         Core Components/Risk Factors/Patient Goals Review   Personal Goals Review Weight Management/Obesity;Heart Failure;Hypertension;Lipids;Diabetes Weight Management/Obesity;Heart Failure;Hypertension;Lipids;Diabetes Weight Management/Obesity;Heart Failure;Hypertension;Lipids;Diabetes     Review Paula Wong started cardiac rehab on 11/24/23. Paula Wong is off to a good start to exercise. Vital sings and CBG's have been stable. Paula Wong is doing well with exercise at cardiac rehab. Vital signs and CBG's have been stable. Paula Wong has increased her met levels. Paula Wong is happy with her progress at cardiac rehab and feels better since she started the program. Paula Wong continues to do well with exercise at cardiac rehab. Paula Wong will complete complete cardiac rehab tenatively at the end of the month. Vital signs have been stable. Some asymptomatic PVC's have been noted will continue to monitor.     Expected Outcomes Paula Wong will continue to participate in cardiac rehab for exercise, nutrition and lifestyle modifications Paula Wong will continue to participate in cardiac rehab for exercise, nutrition and lifestyle modifications Paula Wong will continue to participate in cardiac rehab for exercise, nutrition and lifestyle modifications        Exercise Goals and Review:  Exercise Goals     Row Name 11/20/23 0833             Exercise Goals   Increase Physical Activity Yes       Intervention Provide advice, education, support and  counseling about physical activity/exercise needs.;Develop an individualized exercise prescription for aerobic and resistive training based on initial evaluation findings, risk stratification, comorbidities and participant's personal goals.       Expected Outcomes Short Term: Attend rehab on a regular basis to increase  amount of physical activity.;Long Term: Exercising regularly at least 3-5 days a week.;Long Term: Add in home exercise to make exercise part of routine and to increase amount of physical activity.       Increase Strength and Stamina Yes       Intervention Develop an individualized exercise prescription for aerobic and resistive training based on initial evaluation findings, risk stratification, comorbidities and participant's personal goals.;Provide advice, education, support and counseling about physical activity/exercise needs.       Expected Outcomes Short Term: Increase workloads from initial exercise prescription for resistance, speed, and METs.;Short Term: Perform resistance training exercises routinely during rehab and add in resistance training at home;Long Term: Improve cardiorespiratory fitness, muscular endurance and strength as measured by increased METs and functional capacity ( )       Able to understand and use rate of perceived exertion (RPE) scale Yes       Intervention Provide education and explanation on how to use RPE scale       Expected Outcomes Short Term: Able to use RPE daily in rehab to express subjective intensity level;Long Term:  Able to use RPE to guide intensity level when exercising independently       Knowledge and understanding of Target Heart Rate Range (THRR) Yes       Intervention Provide education and explanation of THRR including how the numbers were predicted and where they are located for reference       Expected Outcomes Short Term: Able to state/look up THRR;Long Term: Able to use THRR to govern intensity when exercising independently;Short Term:  Able to use daily as guideline for intensity in rehab       Understanding of Exercise Prescription Yes       Intervention Provide education, explanation, and written materials on patient's individual exercise prescription       Expected Outcomes Short Term: Able to explain program exercise prescription;Long Term: Able to explain home exercise prescription to exercise independently          Exercise Goals Re-Evaluation:  Exercise Goals Re-Evaluation     Row Name 11/24/23 0827 12/08/23 0821 01/05/24 0819 02/04/24 1622 02/16/24 0816     Exercise Goal Re-Evaluation   Exercise Goals Review Increase Physical Activity;Understanding of Exercise Prescription;Increase Strength and Stamina;Knowledge and understanding of Target Heart Rate Range (THRR);Able to understand and use rate of perceived exertion (RPE) scale Increase Physical Activity;Understanding of Exercise Prescription;Increase Strength and Stamina;Knowledge and understanding of Target Heart Rate Range (THRR);Able to understand and use rate of perceived exertion (RPE) scale Increase Physical Activity;Understanding of Exercise Prescription;Increase Strength and Stamina;Knowledge and understanding of Target Heart Rate Range (THRR);Able to understand and use rate of perceived exertion (RPE) scale Increase Physical Activity;Understanding of Exercise Prescription;Increase Strength and Stamina;Knowledge and understanding of Target Heart Rate Range (THRR);Able to understand and use rate of perceived exertion (RPE) scale Increase Physical Activity;Understanding of Exercise Prescription;Increase Strength and Stamina;Knowledge and understanding of Target Heart Rate Range (THRR);Able to understand and use rate of perceived exertion (RPE) scale   Comments Pt first day in the [facility] program. Pt tolerated exercise well with an average MET level of 1.9. Pt is learning her THRR, RPE and ExRx Reviewed MET's, goals and home ExRx. Pt tolerated exercise well with an  average MET level of 2.0. Pt is doing well and is feeling an increase in strength and stamina. She says shes feeling good and is going to start walking on her own. She will start for 15-30 mins 1.2 days.  Reviewed MET's, goals and home ExRx. Pt tolerated exercise well with an average MET level of 2.18. Pt is doing good with her goals and is feeling an increase in strength and stamina. She wanted to be able to walk more and feel better with walking. so added track for 10 mins as second station, will continue to progress to 15 mins as she's ready Reviewed MET's and goals. Pt tolerated exercise well with an average MET level of 3.93. Pt is doing good with her goals and is feeling an increase in strength and stamina. Her big goal was to walk more and with more confidence so today she wanted to try the whole 30 mins and she did great. Pt graduated the [employer]. Pt tolerated exercise well with an average MET level of 3.55. Pt did very well in the program and is going to continue her progress at [facility] and walking 4-5 days for 30-60 mins per session. He big goal was to walk better and with more confidence. She is now able to walk for 30 mins with a few small rest breaks. She was also able to increase her distance by 525 ft for a total of 1292ft.   Expected Outcomes Will continue to monitor pt and progress workloads as tolerated without sign or symptom Will continue to monitor pt and progress workloads as tolerated without sign or symptom Will continue to monitor pt and progress workloads as tolerated without sign or symptom Will continue to monitor pt and progress workloads as tolerated without sign or symptom She will continue to exercise on her own and gain strength      Nutrition & Weight - Outcomes:  Pre Biometrics - 11/20/23 0959       Pre Biometrics   Waist Circumference 41.5 inches    Hip Circumference 43 inches    Waist to Hip Ratio 0.97 %    Triceps Skinfold 38 mm    % Body Fat  46.7 %    Grip Strength 25 kg    Flexibility --   unable to reach   Single Leg Stand 4 seconds          Post Biometrics - 02/13/24 0841        Post  Biometrics   Height 5' 5 (1.651 m)    Weight 87.4 kg    Waist Circumference 45 inches    Hip Circumference 43 inches    Waist to Hip Ratio 1.05 %    BMI (Calculated) 32.06    Triceps Skinfold 38 mm    % Body Fat 47.1 %    Grip Strength 23 kg    Flexibility --   Unable to reach   Single Leg Stand 4 seconds          Nutrition:  Nutrition Therapy & Goals - 02/16/24 0922       Nutrition Therapy   Diet Heart Healthy Diet    Drug/Food Interactions Statins/Certain Fruits      Personal Nutrition Goals   Nutrition Goal Patient to identify strategies for reducing cardiovascular risk by attending the [facility] education and nutrition series weekly.   goal in progress   Personal Goal #2 Patient to improve diet quality by using the plate method as a guide for meal planning to include lean protein/plant protein, fruits, vegetables, whole grains, nonfat dairy as part of a well-balanced diet.   goal in progress   Personal Goal #3 Patient to identify strategies for weight loss of 0.5-2.0# per  week.   goal not met.   Comments Goals in progress. Patient has medical history of CVA, HTN, HFrEF, CAD, CKD2, DM2, hyperlipidemia. A1c is well controlled. Lipids are at goal. She is taking ozempic to aid with weight loss/blood sugar control. She has maintained her weight since starting with our program. She has attended the [facility] education and nutrition series regularly. Patient will benefit adherence to nutrition, exercise, and lifestyle modification.      Intervention Plan   Intervention Prescribe, educate and counsel regarding individualized specific dietary modifications aiming towards targeted core components such as weight, hypertension, lipid management, diabetes, heart failure and other comorbidities.;Nutrition handout(s) given to patient.     Expected Outcomes Short Term Goal: Understand basic principles of dietary content, such as calories, fat, sodium, cholesterol and nutrients.;Long Term Goal: Adherence to prescribed nutrition plan.          Nutrition Discharge:  Nutrition Assessments - 12/01/23 1429       Rate Your Plate Scores   Pre Score 47          Education Questionnaire Score:  Knowledge Questionnaire Score - 02/16/24 0838       Knowledge Questionnaire Score   Post Score 18/24          Pt graduated from cardiac rehab program 02/16/2024 with completion of  36 exercise and 35 education sessions. Pt maintained very good attendance and progressed nicely during her participation in rehab as evidenced by increased MET level and improvement on walking distance. Repeat  PHQ 9 score= 0.  Pt has made significant lifestyle changes and should be commended for her success.  Paula Wong achieved goals during cardiac rehab.   Pt plans to continue exercise at [facility].  Dr. Wilbert Bihari is the Medical Director for Cardiac Rehabilitation

## 2024-03-01 NOTE — Addendum Note (Signed)
 Encounter addended by: Debarah Garrison MATSU, RN on: 03/01/2024 11:54 AM  Actions taken: Imaging Exam ended

## 2024-03-02 ENCOUNTER — Ambulatory Visit (HOSPITAL_COMMUNITY): Payer: Self-pay | Admitting: Cardiology

## 2024-03-02 DIAGNOSIS — I493 Ventricular premature depolarization: Secondary | ICD-10-CM

## 2024-03-03 NOTE — Telephone Encounter (Signed)
 Pt aware, agreeable, and verbalized understanding  Referral placed

## 2024-03-07 NOTE — Progress Notes (Unsigned)
 Ellouise Console, PA-C [address] [place], [place]  [address] Phone: [phone]   Primary Care Physician: Leontine Cramp, NP  Primary Gastroenterologist:  Ellouise Console, PA-C / Dr. Gordy Starch   Chief Complaint:  F/U GERD, Schatzki's Ring       HPI:   Paula Wong is a 78 y.o. female established patient Dr. Starch, returns for annual follow-up of GERD and history of Schatzki's ring.  Needs refill of pantoprazole  20 Mg once daily.  05/2014 EGD: Schatzki's ring at GE junction.  2 cm hiatal hernia.  Moderate reflux esophagitis.  Normal stomach and duodenum.  Biopsies negative for Barrett's.  05/2014 Colonoscopy: Diverticulosis.  No polyps.  Patient declined any further screening colonoscopies.  She has family history of colon cancer in her mother.  PMH: CVA, CHF, CAD, HTN, V. tach, GERD, diabetes, CKD 2.  Takes aspirin  and Plavix  daily.  Current Outpatient Medications  Medication Sig Dispense Refill   acetaminophen  (TYLENOL ) 325 MG tablet Take 2 tablets (650 mg total) by mouth every 4 (four) hours as needed for headache or mild pain (pain score 1-3).     amiodarone  (PACERONE ) 200 MG tablet Take 200 mg by mouth daily.     aspirin  81 MG chewable tablet Chew 1 tablet (81 mg total) by mouth daily. 90 tablet 3   atorvastatin  (LIPITOR ) 80 MG tablet Take 80 mg by mouth at bedtime.     B-D ULTRAFINE III SHORT PEN 31G X 8 MM MISC Inject into the skin daily.     carvedilol  (COREG ) 25 MG tablet Take 0.5 tablets (12.5 mg total) by mouth every morning AND 1 tablet (25 mg total) every evening.     clopidogrel  (PLAVIX ) 75 MG tablet Take 1 tablet (75 mg total) by mouth daily. 30 tablet 11   FARXIGA  10 MG TABS tablet Take 10 mg by mouth daily.     furosemide  (LASIX ) 20 MG tablet Take 1 tablet (20 mg total) by mouth daily. May take an additional tablet as needed for swelling/SOB 100 tablet 3   gabapentin  (NEURONTIN ) 600 MG tablet Take 600 mg by mouth 2 (two) times daily.     magnesium  oxide  (MAG-OX) 400 MG tablet Take 400 mg by mouth daily.     metFORMIN  (GLUCOPHAGE -XR) 500 MG 24 hr tablet Take 500 mg by mouth daily with breakfast.     nitroGLYCERIN  (NITROSTAT ) 0.4 MG SL tablet Place 1 tablet (0.4 mg total) under the tongue every 5 (five) minutes x 3 doses as needed for chest pain. 25 tablet 5   ONETOUCH ULTRA test strip USE TO CHECK BLOOD SUGARS ONCE DAILY AND AS NEEDED     OZEMPIC, 1 MG/DOSE, 4 MG/3ML SOPN Inject 1 mg into the skin once a week.     pantoprazole  (PROTONIX ) 20 MG tablet Take 1 tablet (20 mg total) by mouth daily. 90 tablet 0   sacubitril -valsartan  (ENTRESTO ) 97-103 MG Take 1 tablet by mouth 2 (two) times daily. 60 tablet 5   spironolactone  (ALDACTONE ) 25 MG tablet Take 1 tablet (25 mg total) by mouth daily. 90 tablet 3   TRESIBA FLEXTOUCH 200 UNIT/ML SOPN Take 80 Units by mouth at bedtime.      No current facility-administered medications for this visit.    Allergies as of 03/08/2024   (No Known Allergies)    Past Medical History:  Diagnosis Date   Allergy    CAD (coronary artery disease)    a. 03/2022 Cath: LM 40ost, 62m, LAD  40ost/p, 33m, 15m, LCX 30m, RCA nl, RV branch 70-->Med rx.   Chronic HFrEF (heart failure with reduced ejection fraction) (HCC)    a. 11/2021 Echo: EF 20-25%; b. 03/2022 Echo: EF 20-25%; c. 11/2022 Echo: EF 25-30%, glob HK.   Diabetes (HCC)    Diverticulosis    GERD (gastroesophageal reflux disease)    Hiatal hernia    Hyperlipidemia    Hypertension    Implantable loop recorder present    a. 03/2022 s/p Abbott IQ EL+ (serial [id number]) ILR in setting of cryptogenic stroke.   Ischemic cardiomyopathy    a. 11/2021 Echo: EF 20-25%; b. 03/2022 Echo: EF 20-25%; c. 11/2022 Echo: EF 25-30%, glob HK, nl RV fxn, mild-mod MR, mild AI, AoV sclerosis.   Neuropathy    feet   Schatzki's ring    Stroke [facility])    a. 03/2022 MRI Brain: subacute cortical infarct involving inf and med L occipial pole w/ petechial hemorrhage; b. 03/2022 s/p Abbott Assert IQ  EL ILR.   Ventricular tachycardia (HCC)    a. 12/2022 noted on ILR.    Past Surgical History:  Procedure Laterality Date   CORONARY LITHOTRIPSY N/A 12/31/2022   Procedure: CORONARY LITHOTRIPSY;  Surgeon: Swaziland, Peter M, MD;  Location: [facility] INVASIVE CV LAB;  Service: Cardiovascular;  Laterality: N/A;   CORONARY STENT INTERVENTION N/A 12/31/2022   Procedure: CORONARY STENT INTERVENTION;  Surgeon: Swaziland, Peter M, MD;  Location: [facility] INVASIVE CV LAB;  Service: Cardiovascular;  Laterality: N/A;   CORONARY ULTRASOUND/IVUS N/A 12/31/2022   Procedure: Coronary Ultrasound/IVUS;  Surgeon: Swaziland, Peter M, MD;  Location: [facility] INVASIVE CV LAB;  Service: Cardiovascular;  Laterality: N/A;   ICD IMPLANT N/A 06/30/2023   Procedure: ICD IMPLANT;  Surgeon: Cindie Ole DASEN, MD;  Location: [facility] INVASIVE CV LAB;  Service: Cardiovascular;  Laterality: N/A;   LEFT HEART CATH AND CORONARY ANGIOGRAPHY N/A 04/16/2022   Procedure: LEFT HEART CATH AND CORONARY ANGIOGRAPHY;  Surgeon: Burnard Debby LABOR, MD;  Location: [facility] INVASIVE CV LAB;  Service: Cardiovascular;  Laterality: N/A;   LEFT HEART CATH AND CORONARY ANGIOGRAPHY N/A 12/31/2022   Procedure: LEFT HEART CATH AND CORONARY ANGIOGRAPHY;  Surgeon: Swaziland, Peter M, MD;  Location: [facility] INVASIVE CV LAB;  Service: Cardiovascular;  Laterality: N/A;   LOOP RECORDER INSERTION N/A 04/15/2022   Procedure: LOOP RECORDER INSERTION;  Surgeon: Cindie Ole DASEN, MD;  Location: [facility] INVASIVE CV LAB;  Service: Cardiovascular;  Laterality: N/A;   MASS EXCISION Right 06/07/2019   Procedure: LOYD OF KELOID TO RIGHT NECK;  Surgeon: Marcus Lung, MD;  Location: [facility];  Service: Plastics;  Laterality: Right;   TOOTH EXTRACTION  2013    Review of Systems:    All systems reviewed and negative except where noted in HPI.    Physical Exam:  There were no vitals taken for this visit. No LMP recorded. Patient is postmenopausal.  General: Well-nourished, well-developed in  no acute distress.  Lungs: Clear to auscultation bilaterally. Non-labored. Heart: Regular rate and rhythm, no murmurs rubs or gallops.  Abdomen: Bowel sounds are normal; Abdomen is Soft; No hepatosplenomegaly, masses or hernias;  No Abdominal Tenderness; No guarding or rebound tenderness. Neuro: Alert and oriented x 3.  Grossly intact.  Psych: Alert and cooperative, normal mood and affect.   Imaging Studies: LONG TERM MONITOR (3-14 DAYS) Result Date: 03/01/2024 Patch Wear Time:  4 days and 22 hours (2025-06-17T09:21:42-0400 to 2025-06-22T08:11:56-0400) Patient had a min HR of 62 bpm, max HR of 110 bpm, and  avg HR of 82 bpm. Predominant underlying rhythm was Sinus Rhythm. Isolated SVEs were occasional (1.3%, 5911), SVE Couplets were rare (<1.0%, 233), and SVE Triplets were rare (<1.0%, 125). Isolated VEs were frequent (21.7%, 101543), VE Couplets were occasional (1.6%, 3797), and VE Triplets were rare (<1.0%, 441). Ventricular Bigeminy and Trigeminy were present. Aditya Sabharwal 4:35 PM   Labs: CBC    Component Value Date/Time   WBC 4.2 06/30/2023 0419   RBC 5.14 (H) 06/30/2023 0419   HGB 13.6 06/30/2023 0419   HCT 42.8 06/30/2023 0419   PLT 239 06/30/2023 0419   MCV 83.3 06/30/2023 0419   MCH 26.5 06/30/2023 0419   MCHC 31.8 06/30/2023 0419   RDW 14.2 06/30/2023 0419   LYMPHSABS 1.1 01/06/2023 1800   MONOABS 0.8 01/06/2023 1800   EOSABS 0.0 01/06/2023 1800   BASOSABS 0.0 01/06/2023 1800    CMP     Component Value Date/Time   NA 141 02/03/2024 0918   NA 146 (H) 10/07/2023 1040   K 4.7 02/03/2024 0918   CL 107 02/03/2024 0918   CO2 24 02/03/2024 0918   GLUCOSE 142 (H) 02/03/2024 0918   BUN 11 02/03/2024 0918   BUN 12 10/07/2023 1040   CREATININE 1.18 (H) 02/03/2024 0918   CREATININE 0.83 10/02/2020 1100   CALCIUM  9.0 02/03/2024 0918   PROT 6.9 02/03/2024 0918   PROT 6.7 10/07/2023 1040   ALBUMIN 3.6 02/03/2024 0918   ALBUMIN 4.1 10/07/2023 1040   AST 20 02/03/2024  0918   ALT 14 02/03/2024 0918   ALKPHOS 48 02/03/2024 0918   BILITOT 1.0 02/03/2024 0918   BILITOT 1.0 10/07/2023 1040   GFRNONAA 47 (L) 02/03/2024 0918   GFRNONAA 69 10/02/2020 1100   GFRAA 80 10/02/2020 1100       Assessment and Plan:   Paula Wong is a 78 y.o. y/o female returns for annual follow-up of  1.  GERD with esophagitis - Refill pantoprazole  20 Mg once daily - Recommend Lifestyle Modifications to prevent Acid Reflux.  Rec. Avoid coffee, sodas, peppermint, garlic, onions, alcohol, citrus fruits, chocolate, tomatoes, fatty and spicey foods.  Avoid eating 2-3 hours before bedtime.    2.  Family history of colon cancer in mother - Patient declines any further colonoscopies due to age    Ellouise Console, [place]  Follow up ***

## 2024-03-08 ENCOUNTER — Ambulatory Visit (INDEPENDENT_AMBULATORY_CARE_PROVIDER_SITE_OTHER): Admitting: Physician Assistant

## 2024-03-08 ENCOUNTER — Encounter: Payer: Self-pay | Admitting: Physician Assistant

## 2024-03-08 VITALS — BP 118/64 | HR 64 | Ht 65.0 in | Wt 192.1 lb

## 2024-03-08 DIAGNOSIS — K2100 Gastro-esophageal reflux disease with esophagitis, without bleeding: Secondary | ICD-10-CM

## 2024-03-08 DIAGNOSIS — Z800 Family history of malignant neoplasm of digestive organs: Secondary | ICD-10-CM

## 2024-03-08 MED ORDER — PANTOPRAZOLE SODIUM 20 MG PO TBEC: 20.0000 mg | DELAYED_RELEASE_TABLET | Freq: Every day | ORAL | 3 refills | Status: AC

## 2024-03-08 NOTE — Patient Instructions (Addendum)
 We have sent the following medications to your pharmacy for you to pick up at your convenience: Pantoprazole  (Protnix) 20 mg once daily.  Follow up as needed.  Thank you for trusting me with your gastrointestinal care!   Ellouise Console, PA-C   _______________________________________________________  If your blood pressure at your visit was 140/90 or greater, please contact your primary care physician to follow up on this.  _______________________________________________________  If you are age 36 or older, your body mass index should be between 23-30. Your Body mass index is 31.97 kg/m. If this is out of the aforementioned range listed, please consider follow up with your Primary Care Provider.  If you are age 78 or younger, your body mass index should be between 19-25. Your Body mass index is 31.97 kg/m. If this is out of the aformentioned range listed, please consider follow up with your Primary Care Provider.   ________________________________________________________  The [facility] providers would like to encourage you to use MYCHART to communicate with providers for non-urgent requests or questions.  Due to long hold times on the telephone, sending your provider a message by [facility] may be a faster and more efficient way to get a response.  Please allow 48 business hours for a response.  Please remember that this is for non-urgent requests.  _______________________________________________________

## 2024-03-24 NOTE — Progress Notes (Signed)
 Addendum: Reviewed and agree with assessment and management plan. Asha Grumbine, Carie Caddy, MD

## 2024-03-28 NOTE — Progress Notes (Signed)
 Cardiology Office Note:  .   Date:  03/28/2024  ID:  Paula Wong, DOB 1946-01-14, MRN [id number] PCP: Paula Cramp, NP  Paula Wong Cardiologist:  Paula Passe, MD (Inactive) Electrophysiologist:  Paula ONEIDA HOLTS, MD {  History of Present Illness: .   Paula Wong is a 78 y.o. female w/PMHx of  HFrEF (EF 20-25% by echo in 11/2021),  HTN, HLD, Type 2 DM, prior tobacco, stroke (Aug 2023)  VT  12/31/22 device clinic got an alert for PMVT/pause episode via her loop.  In review of her chart/CAD, recommended that she come in for further evaluation. reported no obvious symptoms, was dozing on the couch the time of the event. Started on amiodarone  Cath > PCI to mid LAD LVEF reduced  D/c on amiodarone   Readmitted 01/06/24 acute HF exacerbation diuresed discharged 01/08/23  Following with cards team Referred to HF clinic as well  Admitted 06/27/23 w/VT/syncope ICD implanted Amiodrone re-loaded Discharged 07/01/23  Saw Paula Wong 10/02/23 doing well, no changes, planned for Amio labs  Seeing Paula Wong a couple times, last 617/25, enjoying cardiac rehab quite a bit, feeling well. rehab RN had reported asymptomatic, frequent PVCs Coreg  dose increased Was using lasix  PRN, was mildly volume OL > advised to take a dose of lasix  once home Planned monitor for PVC burden  Monitor noted 21.7% PVC burden, Ventricular Bigeminy and Trigeminy were present   Today's visit is scheduled as a 4mo device visit, evaluate PVC burden ROS:   *** mexiletine? *** symptoms *** volume *** device *** any afib? Hx of stroke *** RV lead? GORE?   Device information Abbott loop recorder, implanted 04/15/22 Cryptogenic stroke >>> BSci dual chamber ICD implanted 06/30/23 Secondary prevention  Has BOTH device in place  Arrhythmia/AAD hx VT, started on amiodarone  May 2024 Re-loaded May 2025  Studies Reviewed: SABRA    EKG not done today  DEVICE interrogation done  today and reviewed by myself *** Battery and lead measurements are good ***  July 2025: monitor Patient had a min HR of 62 bpm, max HR of 110 bpm, and avg HR of 82 bpm. Predominant underlying rhythm was Sinus Rhythm. Isolated SVEs were occasional (1.3%, 5911), SVE Couplets were rare (<1.0%, 233), and SVE Triplets were rare (<1.0%, 125). Isolated VEs were frequent (21.7%, 101543), VE Couplets were occasional (1.6%, 3797), and VE Triplets were rare (<1.0%, 441). Ventricular Bigeminy and Trigeminy were present.    11/04/23: LVEF 25%-30%   Left heart catheterization 12/31/2022 Mid Cx lesion is 5% stenosed.   Ost LAD to Prox LAD lesion is 40% stenosed.   Mid LAD-2 lesion is 30% stenosed.   Ost LM lesion is 40% stenosed.   Mid LM to Dist LM lesion is 30% stenosed.   RV Branch lesion is 70% stenosed.   Prox LAD to Mid LAD lesion is 85% stenosed.   Mid LAD-1 lesion is 85% stenosed.   A drug-eluting stent was successfully placed using a SYNERGY XD 3.0X24.   Post intervention, there is a 0% residual stenosis.   Post intervention, there is a 0% residual stenosis.   LV end diastolic pressure is moderately elevated.   2 vessel obstructive CAD with severe LAD stenosis sequentially with heavy calcification. Moderate RCA disease diffusely in a small branch Moderately elevated LVEDP 25 mm Hg Successful PCI of the mid LAD using IVUS guidance, Shockwave intracoronary lithotripsy and DES x 1.   Plan: DAPT for one year. Anticipate possible DC tomorrow.   Echocardiogram 12/06/2022  1. Left ventricular ejection fraction, by estimation, is 25 to 30%. The  left ventricle has severely decreased function. The left ventricle  demonstrates global hypokinesis. The left ventricular internal cavity size  was moderately dilated. Left  ventricular diastolic parameters are indeterminate.   2. Right ventricular systolic function is normal. The right ventricular  size is normal.   3. The mitral valve is normal in  structure. Mild to moderate mitral valve  regurgitation. No evidence of mitral stenosis.   4. The aortic valve is tricuspid. There is mild calcification of the  aortic valve. Aortic valve regurgitation is mild. Aortic valve sclerosis  is present, with no evidence of aortic valve stenosis.   5. The inferior vena cava is normal in size with greater than 50%  respiratory variability, suggesting right atrial pressure of 3 mmHg.    Risk Assessment/Calculations:    Physical Exam:   VS:  There were no vitals taken for this visit.   Wt Readings from Last 3 Encounters:  03/08/24 192 lb 2 oz (87.1 kg)  02/13/24 192 lb 10.9 oz (87.4 kg)  02/03/24 191 lb (86.6 kg)    GEN: Well nourished, well developed in no acute distress NECK: No JVD; No carotid bruits CARDIAC: ***RRR, no murmurs, rubs, gallops RESPIRATORY:  *** CTA b/l without rales, wheezing or rhonchi  ABDOMEN: Soft, non-tender, non-distended EXTREMITIES: *** No edema; No deformity   *** ICD/ILR sites: both *** is stable, no thinning, fluctuation, tethering  ASSESSMENT AND PLAN: .    VT PVCs Amiodarone  chronically ***  ICD *** intact function *** no programming changes made  ILR (cryptogenic stroke) *** AFib by ICD   CAD Ischemic CM *** C/w Dr. Johnnye     {Are you ordering a CV Procedure (e.g. stress test, cath, DCCV, TEE, etc)?   Press F2        :[id number]}     Dispo: ***  Signed, Paula Macario Arthur, PA-C

## 2024-03-30 ENCOUNTER — Encounter: Payer: Self-pay | Admitting: Physician Assistant

## 2024-03-30 ENCOUNTER — Ambulatory Visit: Payer: Self-pay | Admitting: Cardiology

## 2024-03-30 ENCOUNTER — Ambulatory Visit: Attending: Physician Assistant | Admitting: Physician Assistant

## 2024-03-30 ENCOUNTER — Ambulatory Visit: Payer: 59

## 2024-03-30 VITALS — BP 116/74 | HR 88 | Ht 65.0 in | Wt 194.2 lb

## 2024-03-30 DIAGNOSIS — I493 Ventricular premature depolarization: Secondary | ICD-10-CM

## 2024-03-30 DIAGNOSIS — I639 Cerebral infarction, unspecified: Secondary | ICD-10-CM

## 2024-03-30 DIAGNOSIS — I255 Ischemic cardiomyopathy: Secondary | ICD-10-CM

## 2024-03-30 DIAGNOSIS — I2510 Atherosclerotic heart disease of native coronary artery without angina pectoris: Secondary | ICD-10-CM

## 2024-03-30 DIAGNOSIS — I472 Ventricular tachycardia, unspecified: Secondary | ICD-10-CM

## 2024-03-30 DIAGNOSIS — R002 Palpitations: Secondary | ICD-10-CM

## 2024-03-30 DIAGNOSIS — Z95810 Presence of automatic (implantable) cardiac defibrillator: Secondary | ICD-10-CM

## 2024-03-30 DIAGNOSIS — Z79899 Other long term (current) drug therapy: Secondary | ICD-10-CM

## 2024-03-30 DIAGNOSIS — I5022 Chronic systolic (congestive) heart failure: Secondary | ICD-10-CM

## 2024-03-30 LAB — CUP PACEART INCLINIC DEVICE CHECK
Date Time Interrogation Session: 20250812165618
HighPow Impedance: 79 Ohm
Implantable Lead Connection Status: 753985
Implantable Lead Connection Status: 753985
Implantable Lead Implant Date: 20241111
Implantable Lead Implant Date: 20241111
Implantable Lead Location: 753859
Implantable Lead Location: 753860
Implantable Lead Model: 672
Implantable Lead Model: 7841
Implantable Lead Serial Number: 1512046
Implantable Lead Serial Number: 262212
Implantable Pulse Generator Implant Date: 20241111
Lead Channel Impedance Value: 524 Ohm
Lead Channel Impedance Value: 725 Ohm
Lead Channel Pacing Threshold Amplitude: 0.7 V
Lead Channel Pacing Threshold Amplitude: 1 V
Lead Channel Pacing Threshold Pulse Width: 0.4 ms
Lead Channel Pacing Threshold Pulse Width: 0.4 ms
Lead Channel Sensing Intrinsic Amplitude: 25 mV
Lead Channel Sensing Intrinsic Amplitude: 4.5 mV
Lead Channel Setting Pacing Amplitude: 2 V
Lead Channel Setting Pacing Amplitude: 2.5 V
Lead Channel Setting Pacing Pulse Width: 0.4 ms
Lead Channel Setting Sensing Sensitivity: 0.6 mV
Pulse Gen Serial Number: 685817
Zone Setting Status: 755011

## 2024-03-30 NOTE — Patient Instructions (Signed)
 Medication Instructions:  Your physician recommends that you continue on your current medications as directed. Please refer to the Current Medication list given to you today.  *If you need a refill on your cardiac medications before your next appointment, please call your pharmacy*  Lab Work: Mag-TODAY If you have labs (blood work) drawn today and your tests are completely normal, you will receive your results only by: MyChart Message (if you have MyChart) OR A paper copy in the mail If you have any lab test that is abnormal or we need to change your treatment, we will call you to review the results.  Follow-Up: At [facility], you and your health needs are our priority.  As part of our continuing mission to provide you with exceptional heart care, our providers are all part of one team.  This team includes your primary Cardiologist (physician) and Advanced Practice Providers or APPs (Physician Assistants and Nurse Practitioners) who all work together to provide you with the care you need, when you need it.  Your next appointment:   1 month(s)  Provider:   Charlies Arthur, PA-C

## 2024-03-31 LAB — CUP PACEART REMOTE DEVICE CHECK
Battery Remaining Longevity: 156 mo
Battery Remaining Percentage: 100 %
Brady Statistic RA Percent Paced: 1 %
Brady Statistic RV Percent Paced: 0 %
Date Time Interrogation Session: 20250812001100
HighPow Impedance: 76 Ohm
Implantable Lead Connection Status: 753985
Implantable Lead Connection Status: 753985
Implantable Lead Implant Date: 20241111
Implantable Lead Implant Date: 20241111
Implantable Lead Location: 753859
Implantable Lead Location: 753860
Implantable Lead Model: 672
Implantable Lead Model: 7841
Implantable Lead Serial Number: 1512046
Implantable Lead Serial Number: 262212
Implantable Pulse Generator Implant Date: 20241111
Lead Channel Impedance Value: 497 Ohm
Lead Channel Impedance Value: 711 Ohm
Lead Channel Pacing Threshold Amplitude: 0.6 V
Lead Channel Pacing Threshold Pulse Width: 0.4 ms
Lead Channel Setting Pacing Amplitude: 2 V
Lead Channel Setting Pacing Amplitude: 2.5 V
Lead Channel Setting Pacing Pulse Width: 0.4 ms
Lead Channel Setting Sensing Sensitivity: 0.6 mV
Pulse Gen Serial Number: 685817
Zone Setting Status: 755011

## 2024-03-31 LAB — MAGNESIUM: Magnesium: 1.9 mg/dL (ref 1.6–2.3)

## 2024-04-01 ENCOUNTER — Ambulatory Visit: Payer: Self-pay | Admitting: Cardiology

## 2024-04-27 NOTE — Progress Notes (Unsigned)
 Cardiology Office Note:  .   Date:  04/27/2024  ID:  Paula Wong, DOB 05/15/1946, MRN [id number] PCP: Leontine Cramp, NP  [facility] Providers Cardiologist:  Aleene Passe, MD (Inactive) Electrophysiologist:  OLE ONEIDA HOLTS, MD {  History of Present Illness: .   Paula Wong is a 78 y.o. female w/PMHx of  HTN, HLD, Type 2 DM, prior tobacco,  stroke (Aug 2023 > ILR implanted)  CAD (PCI to LAD May 2024) ICM Chronic CHF (systolic) VT >> ICD  12/31/22 device clinic got an alert for PMVT/pause episode via her loop.  In review of her chart/CAD, recommended that she come in for further evaluation. reported no obvious symptoms, was dozing on the couch the time of the event. Started on amiodarone  Cath > PCI to mid LAD LVEF reduced  D/c on amiodarone    Readmitted 01/06/23 acute HF exacerbation diuresed discharged 01/08/23  Following with cards team Referred to HF clinic as well   Admitted 06/27/23 w/VT/syncope ICD implanted Amiodrone re-loaded Discharged 07/01/23  Saw Dr. HOLTS 10/02/23 doing well, no changes, planned for Amio labs  Seeing Dr. Gardenia a couple times, last 02/03/24, enjoying cardiac rehab quite a bit, feeling well. rehab RN had reported asymptomatic, frequent PVCs Coreg  dose increased Was using lasix  PRN, was mildly volume OL > advised to take a dose of lasix  once home Planned monitor for PVC burden  Monitor noted 21.7% PVC burden, Ventricular Bigeminy and Trigeminy were present   I saw her 03/30/24 She feels well Denies any CP Infrequently feels a strange heart beat/maybe skipped or extra, unsure  which None that are persistent. No CP, SOB No near syncope or syncope High PVC burden by monitor placed the day she saw Dr. Cyndie day her coreg  was increased Has had a steady increase in her PVC burden over the last couple remotes Coreg  dose was increased June when she saw Dr. Gardenia One PVC on her EKG/rhythm strip only today and NONE  while on the programmer today Counters reset on her device Back in 1 mo to revisit PVC burden Could consider mexiletine if needed, perhaps coreg  >> Toprol   Today's visit is scheduled as a 48mo device visit, re-evaluate PVC burden ROS:   She is doing well Admits to some dietary indiscretion yesterday with some fried chicken. Her BS 158 today (after eating cereal)  No CP, palpitations, or cardiac awareness Mentioned 3-4 weeks ago having some weak spells, suspected the Omega supplement and stopped it. Since off it, feels much better, no weak spells, near syncope or syncope Though joints are achy off it  No rest SOB, minimal DOE, at her baseline   Device information Abbott loop recorder, implanted 04/15/22 Cryptogenic stroke >>> BSci dual chamber ICD implanted 06/30/23 Secondary prevention  Has BOTH devices in place  Arrhythmia/AAD hx VT, started on amiodarone  May 2024/had PCI to LAD > amiodarone  not pursued chronically Recurrent VT >> Re-loaded Nov 2024  Studies Reviewed: SABRA    EKG not done today  DEVICE interrogation brief evaluation done today for PVC burden and reviewed by myself Battery and auto lead measurements are good PVCs (64K in 29 days) prior (368K in 179days) Observed occ PVCs while watching on programmer, not frequent She has had some NSVTs And 2 episodes in her monitor zone (1:47min and 1:27min both back in Aug) No treated VTs  July 2025: monitor Patient had a min HR of 62 bpm, max HR of 110 bpm, and avg HR of 82 bpm. Predominant underlying rhythm was  Sinus Rhythm. Isolated SVEs were occasional (1.3%, 5911), SVE Couplets were rare (<1.0%, 233), and SVE Triplets were rare (<1.0%, 125). Isolated VEs were frequent (21.7%, 101543), VE Couplets were occasional (1.6%, 3797), and VE Triplets were rare (<1.0%, 441). Ventricular Bigeminy and Trigeminy were present.    11/04/23: LVEF 25%-30%   Left heart catheterization 12/31/2022 Mid Cx lesion is 5% stenosed.   Ost  LAD to Prox LAD lesion is 40% stenosed.   Mid LAD-2 lesion is 30% stenosed.   Ost LM lesion is 40% stenosed.   Mid LM to Dist LM lesion is 30% stenosed.   RV Branch lesion is 70% stenosed.   Prox LAD to Mid LAD lesion is 85% stenosed.   Mid LAD-1 lesion is 85% stenosed.   A drug-eluting stent was successfully placed using a SYNERGY XD 3.0X24.   Post intervention, there is a 0% residual stenosis.   Post intervention, there is a 0% residual stenosis.   LV end diastolic pressure is moderately elevated.   2 vessel obstructive CAD with severe LAD stenosis sequentially with heavy calcification. Moderate RCA disease diffusely in a small branch Moderately elevated LVEDP 25 mm Hg Successful PCI of the mid LAD using IVUS guidance, Shockwave intracoronary lithotripsy and DES x 1.   Plan: DAPT for one year. Anticipate possible DC tomorrow.   Echocardiogram 12/06/2022  1. Left ventricular ejection fraction, by estimation, is 25 to 30%. The  left ventricle has severely decreased function. The left ventricle  demonstrates global hypokinesis. The left ventricular internal cavity size  was moderately dilated. Left  ventricular diastolic parameters are indeterminate.   2. Right ventricular systolic function is normal. The right ventricular  size is normal.   3. The mitral valve is normal in structure. Mild to moderate mitral valve  regurgitation. No evidence of mitral stenosis.   4. The aortic valve is tricuspid. There is mild calcification of the  aortic valve. Aortic valve regurgitation is mild. Aortic valve sclerosis  is present, with no evidence of aortic valve stenosis.   5. The inferior vena cava is normal in size with greater than 50%  respiratory variability, suggesting right atrial pressure of 3 mmHg.    Risk Assessment/Calculations:    Physical Exam:   VS:  There were no vitals taken for this visit.   Wt Readings from Last 3 Encounters:  03/30/24 194 lb 3.2 oz (88.1 kg)  03/08/24 192  lb 2 oz (87.1 kg)  02/13/24 192 lb 10.9 oz (87.4 kg)    GEN: Well nourished, well developed in no acute distress NECK: No JVD; No carotid bruits CARDIAC: RRR, a couple extrasystoles with prolonged auscultation, no murmurs, rubs, gallops RESPIRATORY: CTA b/l without rales, wheezing or rhonchi  ABDOMEN: Soft, non-tender, non-distended EXTREMITIES: No edema; No deformity   ICD site:  is stable, no thinning, fluctuation, tethering  ASSESSMENT AND PLAN: .    VT PVCs Amiodarone  chronically Labs are UTD and OK  Burden remains > largely unchanged She has had some NSVTs and VT in monitor zone > no symptoms, syncope ~ 1 minute Will further increase her coreg  to 25mg  BID and see her back again in a month, if remains will plan for coreg  >> Toprol   ICD intact function no programming changes made  Hx of ILR (cryptogenic stroke) no AFib by ICD One brief PAT   CAD Ischemic CM No symptoms or exam findings of volume OL Heart logic is 10 (appears ~ 7 chronically) Urged low sodium, with some liberalization of  her diet recently C/w Dr. Johnnye   Dispo: back in 14mo, sooner if needed  Signed, Balinda Heacock Macario Arthur, PA-C

## 2024-04-28 ENCOUNTER — Ambulatory Visit (INDEPENDENT_AMBULATORY_CARE_PROVIDER_SITE_OTHER): Admitting: Podiatry

## 2024-04-28 ENCOUNTER — Encounter: Payer: Self-pay | Admitting: Podiatry

## 2024-04-28 DIAGNOSIS — B351 Tinea unguium: Secondary | ICD-10-CM

## 2024-04-28 DIAGNOSIS — M79609 Pain in unspecified limb: Secondary | ICD-10-CM

## 2024-04-28 DIAGNOSIS — E1169 Type 2 diabetes mellitus with other specified complication: Secondary | ICD-10-CM

## 2024-04-28 DIAGNOSIS — E119 Type 2 diabetes mellitus without complications: Secondary | ICD-10-CM | POA: Diagnosis not present

## 2024-04-28 NOTE — Progress Notes (Signed)
 Subjective:  Patient ID: Paula Wong, female    DOB: 1945/09/03,   MRN: [id number]  Chief Complaint  Patient presents with   Diabetes    Nail trim  Saw Luke Miyamoto, NP - 1 month ago; A1c - ?    78 y.o. female presents concern of thickened elongated and painful nails that are difficult to trim. Requesting to have them trimmed today. Relates burning and tingling in their feet. Patient is diabetic and last A1c was  Lab Results  Component Value Date   HGBA1C 6.2 (H) 03/06/2023   .  Does relates recent fracture of her foot and ankle after tripping over an extension cord. Relates she just got out of the boot and doing well.   PCP:  Miyamoto Luke, NP     PCP:  Miyamoto Luke, NP    . Denies any other pedal complaints. Denies n/v/f/c.   Past Medical History:  Diagnosis Date   Allergy    CAD (coronary artery disease)    a. 03/2022 Cath: LM 40ost, 18m, LAD 40ost/p, 17m, 18m, LCX 46m, RCA nl, RV branch 70-->Med rx.   Chronic HFrEF (heart failure with reduced ejection fraction) (HCC)    a. 11/2021 Echo: EF 20-25%; b. 03/2022 Echo: EF 20-25%; c. 11/2022 Echo: EF 25-30%, glob HK.   Diabetes (HCC)    Diverticulosis    GERD (gastroesophageal reflux disease)    Hiatal hernia    Hyperlipidemia    Hypertension    Implantable loop recorder present    a. 03/2022 s/p Abbott IQ EL+ (serial [id number]) ILR in setting of cryptogenic stroke.   Ischemic cardiomyopathy    a. 11/2021 Echo: EF 20-25%; b. 03/2022 Echo: EF 20-25%; c. 11/2022 Echo: EF 25-30%, glob HK, nl RV fxn, mild-mod MR, mild AI, AoV sclerosis.   Neuropathy    feet   Schatzki's ring    Stroke [facility])    a. 03/2022 MRI Brain: subacute cortical infarct involving inf and med L occipial pole w/ petechial hemorrhage; b. 03/2022 s/p Abbott Assert IQ EL ILR.   Ventricular tachycardia (HCC)    a. 12/2022 noted on ILR.    Objective:  Physical Exam: Vascular: DP/PT pulses 2/4 bilateral. CFT <3 seconds. Absent hair growth on digits. Edema noted to  bilateral lower extremities. Xerosis noted bilaterally.  Skin. No lacerations or abrasions bilateral feet. Nails 1-5 bilateral  are thickened discolored and elongated with subungual debris.  Musculoskeletal: MMT 5/5 bilateral lower extremities in DF, PF, Inversion and Eversion. Deceased ROM in DF of ankle joint. Mild tenderness and edema noted along lateral malleolus and fibula.  Neurological: Sensation intact to light touch. Protective sensation intact bilateral.    Assessment:   1. Pain due to onychomycosis of nail   2. Type 2 diabetes mellitus with other specified complication, without long-term current use of insulin  (HCC)   3. Encounter for diabetic foot exam (HCC)       Plan:  Patient was evaluated and treated and all questions answered. -Discussed and educated patient on diabetic foot care, especially with  regards to the vascular, neurological and musculoskeletal systems.  -Stressed the importance of good glycemic control and the detriment of not  controlling glucose levels in relation to the foot. -Discussed supportive shoes at all times and checking feet regularly.  -Mechanically debrided all nails 1-5 bilateral using sterile nail nipper and filed with dremel without incident  -Answered all patient questions -Patient to return  in 3 months for at risk foot care -Patient advised to  call the office if any problems or questions arise in the meantime.    Asberry Failing, DPM

## 2024-04-29 ENCOUNTER — Ambulatory Visit: Attending: Physician Assistant | Admitting: Physician Assistant

## 2024-04-29 ENCOUNTER — Encounter: Payer: Self-pay | Admitting: Physician Assistant

## 2024-04-29 VITALS — BP 122/74 | HR 87 | Ht 65.0 in | Wt 193.0 lb

## 2024-04-29 DIAGNOSIS — Z95810 Presence of automatic (implantable) cardiac defibrillator: Secondary | ICD-10-CM

## 2024-04-29 DIAGNOSIS — I472 Ventricular tachycardia, unspecified: Secondary | ICD-10-CM | POA: Diagnosis not present

## 2024-04-29 DIAGNOSIS — I255 Ischemic cardiomyopathy: Secondary | ICD-10-CM

## 2024-04-29 DIAGNOSIS — I493 Ventricular premature depolarization: Secondary | ICD-10-CM

## 2024-04-29 DIAGNOSIS — I2510 Atherosclerotic heart disease of native coronary artery without angina pectoris: Secondary | ICD-10-CM | POA: Diagnosis not present

## 2024-04-29 LAB — CUP PACEART INCLINIC DEVICE CHECK
Date Time Interrogation Session: 20250911120224
Implantable Lead Connection Status: 753985
Implantable Lead Connection Status: 753985
Implantable Lead Implant Date: 20241111
Implantable Lead Implant Date: 20241111
Implantable Lead Location: 753859
Implantable Lead Location: 753860
Implantable Lead Model: 672
Implantable Lead Model: 7841
Implantable Lead Serial Number: 1512046
Implantable Lead Serial Number: 262212
Implantable Pulse Generator Implant Date: 20241111
Lead Channel Setting Pacing Amplitude: 2 V
Lead Channel Setting Pacing Amplitude: 2.5 V
Lead Channel Setting Pacing Pulse Width: 0.4 ms
Lead Channel Setting Sensing Sensitivity: 0.6 mV
Pulse Gen Serial Number: 685817
Zone Setting Status: 755011

## 2024-04-29 MED ORDER — CARVEDILOL 25 MG PO TABS: 25.0000 mg | ORAL_TABLET | Freq: Two times a day (BID) | ORAL | 3 refills | Status: DC

## 2024-04-29 NOTE — Patient Instructions (Signed)
 Medication Instructions:   START TAKING : COREG  25 MG  TWICE  A DAY    *If you need a refill on your cardiac medications before your next appointment, please call your pharmacy*   Lab Work: NONE ORDERED  TODAY   If you have labs (blood work) drawn today and your tests are completely normal, you will receive your results only by: MyChart Message (if you have MyChart) OR A paper copy in the mail If you have any lab test that is abnormal or we need to change your treatment, we will call you to review the results.   Testing/Procedures: NONE ORDERED  TODAY    Follow-Up: At [facility] ([place]), you and your health needs are our priority.  As part of our continuing mission to provide you with exceptional heart care, our providers are all part of one team.  This team includes your primary Cardiologist (physician) and Advanced Practice Providers or APPs (Physician Assistants and Nurse Practitioners) who all work together to provide you with the care you need, when you need it.  Your next appointment:  1 month(s)  Provider:    Charlies Arthur, PA-C ( CONTACT  CASSIE HALL/ ANGELINE HAMMER FOR EP SCHEDULING ISSUES )    We recommend signing up for the patient portal called MyChart.  Sign up information is provided on this After Visit Summary.  MyChart is used to connect with patients for Virtual Visits (Telemedicine).  Patients are able to view lab/test results, encounter notes, upcoming appointments, etc.  Non-urgent messages can be sent to your provider as well.   To learn more about what you can do with MyChart, go to [email or web address].   Other Instructions

## 2024-04-30 ENCOUNTER — Ambulatory Visit: Payer: Self-pay | Admitting: Cardiology

## 2024-05-03 ENCOUNTER — Emergency Department (HOSPITAL_COMMUNITY): Admission: EM | Admit: 2024-05-03 | Discharge: 2024-05-03 | Disposition: A | Discharge status: Home / Self Care

## 2024-05-03 ENCOUNTER — Other Ambulatory Visit: Payer: Self-pay

## 2024-05-03 ENCOUNTER — Emergency Department (HOSPITAL_COMMUNITY)

## 2024-05-03 ENCOUNTER — Encounter (HOSPITAL_COMMUNITY): Payer: Self-pay

## 2024-05-03 DIAGNOSIS — I4510 Unspecified right bundle-branch block: Secondary | ICD-10-CM | POA: Insufficient documentation

## 2024-05-03 DIAGNOSIS — Z79899 Other long term (current) drug therapy: Secondary | ICD-10-CM | POA: Insufficient documentation

## 2024-05-03 DIAGNOSIS — I493 Ventricular premature depolarization: Secondary | ICD-10-CM | POA: Diagnosis not present

## 2024-05-03 DIAGNOSIS — Z7982 Long term (current) use of aspirin: Secondary | ICD-10-CM | POA: Insufficient documentation

## 2024-05-03 DIAGNOSIS — I255 Ischemic cardiomyopathy: Secondary | ICD-10-CM | POA: Insufficient documentation

## 2024-05-03 DIAGNOSIS — R079 Chest pain, unspecified: Secondary | ICD-10-CM | POA: Diagnosis not present

## 2024-05-03 DIAGNOSIS — I2510 Atherosclerotic heart disease of native coronary artery without angina pectoris: Secondary | ICD-10-CM | POA: Insufficient documentation

## 2024-05-03 DIAGNOSIS — Z7901 Long term (current) use of anticoagulants: Secondary | ICD-10-CM | POA: Insufficient documentation

## 2024-05-03 DIAGNOSIS — Z950 Presence of cardiac pacemaker: Secondary | ICD-10-CM | POA: Insufficient documentation

## 2024-05-03 DIAGNOSIS — R55 Syncope and collapse: Secondary | ICD-10-CM | POA: Insufficient documentation

## 2024-05-03 DIAGNOSIS — I472 Ventricular tachycardia, unspecified: Secondary | ICD-10-CM | POA: Insufficient documentation

## 2024-05-03 LAB — BASIC METABOLIC PANEL WITH GFR
Anion gap: 11 (ref 5–15)
BUN: 11 mg/dL (ref 8–23)
CO2: 23 mmol/L (ref 22–32)
Calcium: 9.1 mg/dL (ref 8.9–10.3)
Chloride: 107 mmol/L (ref 98–111)
Creatinine, Ser: 1.04 mg/dL — ABNORMAL HIGH (ref 0.44–1.00)
GFR, Estimated: 55 mL/min — ABNORMAL LOW (ref >60)
Glucose, Bld: 90 mg/dL (ref 70–99)
Potassium: 4 mmol/L (ref 3.5–5.1)
Sodium: 141 mmol/L (ref 135–145)

## 2024-05-03 LAB — CBC
HCT: 46.4 % — ABNORMAL HIGH (ref 36.0–46.0)
Hemoglobin: 14.7 g/dL (ref 12.0–15.0)
MCH: 27.6 pg (ref 26.0–34.0)
MCHC: 31.7 g/dL (ref 30.0–36.0)
MCV: 87.2 fL (ref 80.0–100.0)
Platelets: 217 K/uL (ref 150–400)
RBC: 5.32 MIL/uL — ABNORMAL HIGH (ref 3.87–5.11)
RDW: 13.7 % (ref 11.5–15.5)
WBC: 6 K/uL (ref 4.0–10.5)
nRBC: 0 % (ref 0.0–0.2)

## 2024-05-03 LAB — TSH: TSH: 1.48 u[IU]/mL (ref 0.350–4.500)

## 2024-05-03 LAB — MAGNESIUM: Magnesium: 1.8 mg/dL (ref 1.7–2.4)

## 2024-05-03 LAB — TROPONIN I (HIGH SENSITIVITY)
Troponin I (High Sensitivity): 3 ng/L (ref <18)
Troponin I (High Sensitivity): <2 ng/L (ref <18)

## 2024-05-03 MED ORDER — SODIUM CHLORIDE 0.9 % IV BOLUS
250.0000 mL | Freq: Once | INTRAVENOUS | Status: AC
  Administered 2024-05-03: 250 mL via INTRAVENOUS

## 2024-05-03 MED ORDER — CARVEDILOL 25 MG PO TABS: 12.5000 mg | ORAL_TABLET | Freq: Two times a day (BID) | ORAL | 3 refills | Status: AC

## 2024-05-03 MED ORDER — MAGNESIUM OXIDE -MG SUPPLEMENT 400 (240 MG) MG PO TABS
400.0000 mg | ORAL_TABLET | Freq: Once | ORAL | Status: AC
  Administered 2024-05-03: 400 mg via ORAL
  Filled 2024-05-03: qty 1

## 2024-05-03 NOTE — ED Triage Notes (Addendum)
 Pt presents with a hx of lightheadedness and near syncope for the past 2 months. She states when she experiences lightheadedness when she sips water the lightheadedness goes away. Pt has told her PCP about the episodes and has a cardiology specialist, but she has not been evaluated [facility] cards for this specific complaint. On 9/12 she experienced a syncopal episode. Pt went to UC today and was told to come to the ED due to an abnormal EKLG showing a prolonged QTc. Pt does have a ICD in place, but denies it firing. Pt does endorse some chest wall pain after the syncope. She doesn't remember hitting her head. Denies HA or vomiting. Pt is on anticoagulants.

## 2024-05-03 NOTE — Discharge Instructions (Signed)
 Please follow-up with your cardiologist.  I did discuss your case with cardiology today.  They recommend decreasing your coreg  back down to 12.5 mg twice daily as opposed to 25 mg twice daily.  Please make sure you stay well-hydrated.  Drink plenty of fluid.   If this happens again, please come back to the ED  If you have any kind of chest pain or shortness of breath please come back to the ED.

## 2024-05-03 NOTE — ED Provider Triage Note (Signed)
 Emergency Medicine Provider Triage Evaluation Note  Akeyla Molden , a 78 y.o. female  was evaluated in triage.  Pt complains of chest pain. States its midsternal, pinpoint nonradiating. Had a syncopal episode Friday and has had sternal pain since.   Review of Systems  Positive: Chest pain, syncope Negative: Shortness of breath,  nausea, vomiting   Physical Exam  BP 112/71   Pulse 84   Temp 98 F (36.7 C)   Resp 17   Ht 5' 5 (1.651 m)   Wt 86.6 kg   SpO2 96%   BMI 31.78 kg/m  Gen:   Awake, no distress   Resp:  Normal effort  MSK:   Moves extremities without difficulty    Medical Decision Making  Medically screening exam initiated at 12:11 PM.  Appropriate orders placed.  Luceil Herrin was informed that the remainder of the evaluation will be completed by another provider, this initial triage assessment does not replace that evaluation, and the importance of remaining in the ED until their evaluation is complete.     Gennaro Duwaine CROME, DO 05/03/24 1211

## 2024-05-03 NOTE — ED Notes (Signed)
 [employer] ICD interrogated by this [employer] fundraiser

## 2024-05-03 NOTE — ED Provider Notes (Signed)
 [place] EMERGENCY DEPARTMENT AT [facility] Provider Note   CSN: [id number] Arrival date & time: 05/03/24  1020     Patient presents with: Loss of Consciousness   Paula Wong is a 78 y.o. female.    Loss of Consciousness    Presents because episodes of loss of consciousness.  Patient states over the past month, she will have near syncopal episodes where she feels like she has to drink water to keep from passing out.  Patient states that this is random in nature.  Not necessarily associated with going from a sitting to standing position but seems to be more random in nature.  Patient states on Friday she was standing at the kitchen sink whenever she became lightheaded.  She states that she could not make it to the fridge in time in order to drink water.  Subsequently woke up on the floor.  Does not think she hit her head because her arm was in between her head and floor.  Has not having, headaches or vision changes since then.  No cervical thoracic or lumbar pain.  Patient states that since the fall, she has had some midsternal chest pain that seems to hurt worse whenever she presses on it.  Patient states that she has not had any kind of syncopal events since then.  Previous medical history reviewed : Last followed up with cardiology in September 2025.  Previous cath showed LAD disease.  Stent was placed.  Patient has pacemaker.  Has had episodes of NSVT as well as VT monitor zone.  History of coronary artery disease.  Ischemic cardiomyopathy.   Prior to Admission medications   Medication Sig Start Date End Date Taking? Authorizing Provider  acetaminophen  (TYLENOL ) 325 MG tablet Take 2 tablets (650 mg total) by mouth every 4 (four) hours as needed for headache or mild pain (pain score 1-3). 07/01/23   Lesia Ozell Barter, PA-C  amiodarone  (PACERONE ) 200 MG tablet Take 200 mg by mouth daily.    [provider]  aspirin  81 MG chewable tablet Chew 1 tablet (81  mg total) by mouth daily. 01/03/23   Darryle Thom CROME, PA-C  atorvastatin  (LIPITOR ) 80 MG tablet Take 80 mg by mouth at bedtime. 04/18/22   [provider]  B-D ULTRAFINE III SHORT PEN 31G X 8 MM MISC Inject into the skin daily. 09/23/23   [provider]  carvedilol  (COREG ) 25 MG tablet Take 0.5 tablets (12.5 mg total) by mouth 2 (two) times daily with a meal. 05/03/24   Simon Lavonia SAILOR, MD  clopidogrel  (PLAVIX ) 75 MG tablet Take 1 tablet (75 mg total) by mouth daily. 01/02/23   Darryle Thom CROME, PA-C  FARXIGA  10 MG TABS tablet Take 10 mg by mouth daily. 03/14/22   [provider]  furosemide  (LASIX ) 20 MG tablet Take 1 tablet (20 mg total) by mouth daily. May take an additional tablet as needed for swelling/SOB 11/04/23   Sabharwal, Aditya, DO  gabapentin  (NEURONTIN ) 600 MG tablet Take 600 mg by mouth 2 (two) times daily. 08/17/22   [provider]  magnesium  oxide (MAG-OX) 400 MG tablet Take 400 mg by mouth daily.    [provider]  nitroGLYCERIN  (NITROSTAT ) 0.4 MG SL tablet Place 1 tablet (0.4 mg total) under the tongue every 5 (five) minutes x 3 doses as needed for chest pain. 02/17/24   Sabharwal, Aditya, DO  ONETOUCH ULTRA test strip USE TO CHECK BLOOD SUGARS ONCE DAILY AND AS NEEDED 01/24/23  [provider]  OZEMPIC, 1 MG/DOSE, 4 MG/3ML SOPN Inject 1 mg into the skin once a week. 12/01/22   [provider]  pantoprazole  (PROTONIX ) 20 MG tablet Take 1 tablet (20 mg total) by mouth daily. 03/08/24   [place], PA-C  sacubitril -valsartan  (ENTRESTO ) 97-103 MG Take 1 tablet by mouth 2 (two) times daily. 03/06/23   Sabharwal, Aditya, DO  SODIUM FLUORIDE  5000 PPM 1.1 % PSTE Take 1 Application by mouth daily. 12/12/23   [provider]  spironolactone  (ALDACTONE ) 25 MG tablet Take 1 tablet (25 mg total) by mouth daily. 01/24/23   Colletta Manuelita Garre, PA-C  TRESIBA FLEXTOUCH 200 UNIT/ML SOPN Take 80 Units by mouth at bedtime.  10/12/18    [provider]    Allergies: Patient has no known allergies.    Review of Systems  Cardiovascular:  Positive for syncope.    Updated Vital Signs BP 104/86   Pulse 85   Temp 99.7 F (37.6 C) (Oral)   Resp 15   Ht 5' 5 (1.651 m)   Wt 86.6 kg   SpO2 99%   BMI 31.78 kg/m   Physical Exam Vitals and nursing note reviewed.  Constitutional:      General: She is not in acute distress.    Appearance: She is well-developed.  HENT:     Head: Normocephalic and atraumatic.  Eyes:     Conjunctiva/sclera: Conjunctivae normal.  Cardiovascular:     Rate and Rhythm: Normal rate and regular rhythm.     Heart sounds: No murmur heard. Pulmonary:     Effort: Pulmonary effort is normal. No respiratory distress.     Breath sounds: Normal breath sounds.  Abdominal:     Palpations: Abdomen is soft.     Tenderness: There is no abdominal tenderness.  Musculoskeletal:        General: No swelling.       Arms:     Cervical back: Neck supple.  Skin:    General: Skin is warm and dry.     Capillary Refill: Capillary refill takes less than 2 seconds.  Neurological:     Mental Status: She is alert and oriented to person, place, and time.  Psychiatric:        Mood and Affect: Mood normal.     (all labs ordered are listed, but only abnormal results are displayed) Labs Reviewed  BASIC METABOLIC PANEL WITH GFR - Abnormal; Notable for the following components:      Result Value   Creatinine, Ser 1.04 (*)    GFR, Estimated 55 (*)    All other components within normal limits  CBC - Abnormal; Notable for the following components:   RBC 5.32 (*)    HCT 46.4 (*)    All other components within normal limits  MAGNESIUM   TSH  TROPONIN I (HIGH SENSITIVITY)  TROPONIN I (HIGH SENSITIVITY)    EKG: EKG Interpretation Date/Time:  Monday May 03 2024 12:06:04 EDT Ventricular Rate:  86 PR Interval:  182 QRS Duration:  114 QT Interval:  456 QTC Calculation: 545 R  Axis:   -28  Text Interpretation: Sinus rhythm with frequent Premature ventricular complexes Incomplete right bundle branch block Septal infarct , age undetermined Prolonged QT Confirmed by Gennaro Bouchard ([id number]) on 05/03/2024 12:07:03 PM  Radiology: [facility] Chest 2 View Result Date: 05/03/2024 CLINICAL DATA:  Mid chest pain.  Recent syncopal episode. EXAM: CHEST - 2 VIEW COMPARISON:  Chest radiograph dated 07/09/2023 FINDINGS: Lines/tubes: Left chest wall ICD  leads project over the right atrium and ventricle. Left anterior chest wall loop recorder. Lungs: Well inflated lungs. No focal consolidation. Pleura: No pneumothorax or pleural effusion. Heart/mediastinum: Similar enlarged cardiomediastinal silhouette. Bones: T12 superior endplate compression deformity, apparently new from 07/01/2023. IMPRESSION: 1. No active cardiopulmonary disease. 2. T12 superior endplate compression deformity, apparently new from 07/01/2023. Recommend correlation with point tenderness. 3. Similar cardiomegaly. Electronically Signed   By: Limin  Xu M.D.   On: 05/03/2024 12:35     Procedures   Medications Ordered in the ED  magnesium  oxide (MAG-OX) tablet 400 mg (has no administration in time range)  sodium chloride  0.9 % bolus 250 mL (0 mLs Intravenous Stopped 05/03/24 2005)    Clinical Course as of 05/03/24 2009  Mon May 03, 2024  1914 Per cards: switch back to 12.5 mg BID from 25 mg bid.   [TL]    Clinical Course User Index [TL] Simon Lavonia SAILOR, MD                                 Medical Decision Making Amount and/or Complexity of Data Reviewed Labs: ordered. Radiology: ordered.  Risk OTC drugs. Prescription drug management.     Presents because episodes of loss of consciousness.  Patient states over the past month, she will have near syncopal episodes where she feels like she has to drink water to keep from passing out.  Patient states that this is random in nature.  Not necessarily associated with going  from a sitting to standing position but seems to be more random in nature.  Patient states on Friday she was standing at the kitchen sink whenever she became lightheaded.  She states that she could not make it to the fridge in time in order to drink water.  Subsequently woke up on the floor.  Does not think she hit her head because her arm was in between her head and floor.  Has not having, headaches or vision changes since then.  No cervical thoracic or lumbar pain.  Patient states that since the fall, she has had some midsternal chest pain that seems to hurt worse whenever she presses on it.  Patient states that she has not had any kind of syncopal events since then.  Previous medical history reviewed : Last followed up with cardiology in September 2025.  Previous cath showed LAD disease.  Stent was placed.  Patient has pacemaker.  Has had episodes of NSVT as well as VT monitor zone.  History of coronary artery disease.  Ischemic cardiomyopathy.   Upon exam, patient hemodynamically stable.  ANO x 3 GCS 15.  NIH 0.  Cranials 2 through 12 intact.  Normal finger-nose.  No concerns for CVA.  No concern for any, subdural epidural.  No indication obtain CT imaging of the head.   Did obtain chest x-ray given the reproducible chest wall pain.  No evidence of pneumothorax.  Did show possible T12 fracture but patient has no pain to palpation to this area.  Unclear whenever this happened.  No midline pain of the cervical thoracic or lumbar vertebrae.   Consulted Cardiology Dr Jeffrie: Recommended decreasing metoprolol  back to 12.5 mg twice daily.  We discussed the pacemaker interrogation as well.  No need for inpatient admission at this point time for ongoing observation.  Remainder cardiac telemetry here.  Decently high PVC burden.  No evidence of any kind of V. tach or other arrhythmia.  Consistent with her pacemaker interrogation as well.   Patient remained hematin was stable here in the ED  Magnesium  1.8.   This was replaced orally.  Potassium within normal notes.  TSH normal.   Patient has reproducible chest wall pain.  Likely due to the fall.  No concerns of ACS process at this time.     Final diagnoses:  Syncope and collapse  Syncope, unspecified syncope type    ED Discharge Orders          Ordered    carvedilol  (COREG ) 25 MG tablet  2 times daily with meals        05/03/24 2004               Simon Lavonia SAILOR, MD 05/03/24 2009

## 2024-05-03 NOTE — ED Notes (Signed)
 Urine sent to main lab just in case is needed

## 2024-05-03 NOTE — ED Notes (Signed)
 Awaiting patient from lobby.

## 2024-05-03 NOTE — ED Notes (Signed)
 Pt says she has not gotten anything to eat since her arrival. Pt. given lunch bag per MD with water and OJ.

## 2024-05-05 ENCOUNTER — Inpatient Hospital Stay (HOSPITAL_COMMUNITY)
Admission: EM | Admit: 2024-05-05 | Discharge: 2024-05-08 | DRG: 309 | Disposition: A | Discharge status: Home / Self Care | Attending: Family Medicine | Admitting: Family Medicine

## 2024-05-05 ENCOUNTER — Other Ambulatory Visit (HOSPITAL_COMMUNITY): Payer: Self-pay | Admitting: Cardiology

## 2024-05-05 ENCOUNTER — Telehealth (HOSPITAL_COMMUNITY): Payer: Self-pay | Admitting: Cardiology

## 2024-05-05 DIAGNOSIS — Z4502 Encounter for adjustment and management of automatic implantable cardiac defibrillator: Secondary | ICD-10-CM

## 2024-05-05 DIAGNOSIS — Z7902 Long term (current) use of antithrombotics/antiplatelets: Secondary | ICD-10-CM

## 2024-05-05 DIAGNOSIS — Z8673 Personal history of transient ischemic attack (TIA), and cerebral infarction without residual deficits: Secondary | ICD-10-CM

## 2024-05-05 DIAGNOSIS — I5022 Chronic systolic (congestive) heart failure: Secondary | ICD-10-CM | POA: Diagnosis present

## 2024-05-05 DIAGNOSIS — K219 Gastro-esophageal reflux disease without esophagitis: Secondary | ICD-10-CM | POA: Diagnosis present

## 2024-05-05 DIAGNOSIS — E785 Hyperlipidemia, unspecified: Secondary | ICD-10-CM | POA: Diagnosis present

## 2024-05-05 DIAGNOSIS — E1122 Type 2 diabetes mellitus with diabetic chronic kidney disease: Secondary | ICD-10-CM | POA: Diagnosis present

## 2024-05-05 DIAGNOSIS — Z800 Family history of malignant neoplasm of digestive organs: Secondary | ICD-10-CM

## 2024-05-05 DIAGNOSIS — R55 Syncope and collapse: Principal | ICD-10-CM | POA: Diagnosis present

## 2024-05-05 DIAGNOSIS — Z7982 Long term (current) use of aspirin: Secondary | ICD-10-CM

## 2024-05-05 DIAGNOSIS — I472 Ventricular tachycardia, unspecified: Principal | ICD-10-CM | POA: Diagnosis present

## 2024-05-05 DIAGNOSIS — I2510 Atherosclerotic heart disease of native coronary artery without angina pectoris: Secondary | ICD-10-CM | POA: Diagnosis present

## 2024-05-05 DIAGNOSIS — Z79899 Other long term (current) drug therapy: Secondary | ICD-10-CM

## 2024-05-05 DIAGNOSIS — Z7984 Long term (current) use of oral hypoglycemic drugs: Secondary | ICD-10-CM

## 2024-05-05 DIAGNOSIS — Z87891 Personal history of nicotine dependence: Secondary | ICD-10-CM

## 2024-05-05 DIAGNOSIS — E1142 Type 2 diabetes mellitus with diabetic polyneuropathy: Secondary | ICD-10-CM | POA: Diagnosis present

## 2024-05-05 DIAGNOSIS — E66811 Obesity, class 1: Secondary | ICD-10-CM | POA: Diagnosis present

## 2024-05-05 DIAGNOSIS — I493 Ventricular premature depolarization: Secondary | ICD-10-CM | POA: Diagnosis present

## 2024-05-05 DIAGNOSIS — N1831 Chronic kidney disease, stage 3a: Secondary | ICD-10-CM | POA: Diagnosis present

## 2024-05-05 DIAGNOSIS — I130 Hypertensive heart and chronic kidney disease with heart failure and stage 1 through stage 4 chronic kidney disease, or unspecified chronic kidney disease: Secondary | ICD-10-CM | POA: Diagnosis present

## 2024-05-05 DIAGNOSIS — I255 Ischemic cardiomyopathy: Secondary | ICD-10-CM | POA: Diagnosis present

## 2024-05-05 DIAGNOSIS — Z7985 Long-term (current) use of injectable non-insulin antidiabetic drugs: Secondary | ICD-10-CM

## 2024-05-05 DIAGNOSIS — Z955 Presence of coronary angioplasty implant and graft: Secondary | ICD-10-CM

## 2024-05-05 DIAGNOSIS — I480 Paroxysmal atrial fibrillation: Secondary | ICD-10-CM | POA: Diagnosis present

## 2024-05-05 DIAGNOSIS — Z6831 Body mass index (BMI) 31.0-31.9, adult: Secondary | ICD-10-CM

## 2024-05-05 DIAGNOSIS — E876 Hypokalemia: Secondary | ICD-10-CM | POA: Diagnosis present

## 2024-05-05 DIAGNOSIS — Z8249 Family history of ischemic heart disease and other diseases of the circulatory system: Secondary | ICD-10-CM

## 2024-05-05 LAB — CBC
HCT: 45.1 % (ref 36.0–46.0)
Hemoglobin: 14.3 g/dL (ref 12.0–15.0)
MCH: 27.4 pg (ref 26.0–34.0)
MCHC: 31.7 g/dL (ref 30.0–36.0)
MCV: 86.6 fL (ref 80.0–100.0)
Platelets: 236 K/uL (ref 150–400)
RBC: 5.21 MIL/uL — ABNORMAL HIGH (ref 3.87–5.11)
RDW: 13.7 % (ref 11.5–15.5)
WBC: 5.6 K/uL (ref 4.0–10.5)
nRBC: 0 % (ref 0.0–0.2)

## 2024-05-05 LAB — URINALYSIS, ROUTINE W REFLEX MICROSCOPIC
Bilirubin Urine: NEGATIVE
Glucose, UA: >=500 mg/dL — AB
Hgb urine dipstick: NEGATIVE
Ketones, ur: NEGATIVE mg/dL
Nitrite: NEGATIVE
Protein, ur: NEGATIVE mg/dL
Specific Gravity, Urine: 1.025 (ref 1.005–1.030)
pH: 5 (ref 5.0–8.0)

## 2024-05-05 LAB — COMPREHENSIVE METABOLIC PANEL WITH GFR
ALT: 15 U/L (ref 0–44)
AST: 21 U/L (ref 15–41)
Albumin: 3.7 g/dL (ref 3.5–5.0)
Alkaline Phosphatase: 52 U/L (ref 38–126)
Anion gap: 9 (ref 5–15)
BUN: 10 mg/dL (ref 8–23)
CO2: 25 mmol/L (ref 22–32)
Calcium: 9.1 mg/dL (ref 8.9–10.3)
Chloride: 108 mmol/L (ref 98–111)
Creatinine, Ser: 1.07 mg/dL — ABNORMAL HIGH (ref 0.44–1.00)
GFR, Estimated: 53 mL/min — ABNORMAL LOW (ref >60)
Glucose, Bld: 114 mg/dL — ABNORMAL HIGH (ref 70–99)
Potassium: 3.9 mmol/L (ref 3.5–5.1)
Sodium: 142 mmol/L (ref 135–145)
Total Bilirubin: 1.5 mg/dL — ABNORMAL HIGH (ref 0.0–1.2)
Total Protein: 7.4 g/dL (ref 6.5–8.1)

## 2024-05-05 LAB — CBG MONITORING, ED: Glucose-Capillary: 115 mg/dL — ABNORMAL HIGH (ref 70–99)

## 2024-05-05 LAB — PROTIME-INR
INR: 1.1 (ref 0.8–1.2)
Prothrombin Time: 14.8 s (ref 11.4–15.2)

## 2024-05-05 LAB — TROPONIN I (HIGH SENSITIVITY)
Troponin I (High Sensitivity): 6 ng/L (ref <18)
Troponin I (High Sensitivity): 10 ng/L (ref <18)

## 2024-05-05 NOTE — ED Provider Triage Note (Signed)
 Emergency Medicine Provider Triage Evaluation Note  Paula Wong , a 78 y.o. female  was evaluated in triage.  Pt complains of syncope, seen here for same Friday, dc home after normal work up. 1:30pm today while going to the fridge for a snack, felt dizzy and light headed and got a water thinking this would help. Sat down on the floor bc didn't want to fall, felt like she was going down. Blacked out for unknown amount of time but woke up at 1:35 with water all over her. Called her PCP, nurse called her back 2 hours later, advised to call her cardiologist who said she would look at her record and told her to go back to the ER.   Review of Systems  Positive:  Negative:   Physical Exam  BP (!) 121/47 (BP Location: Right Arm)   Pulse 90   Temp 98.8 F (37.1 C) (Oral)   Resp 18   Wt 86 kg   SpO2 98%   BMI 31.55 kg/m  Gen:   Awake, no distress   Resp:  Normal effort  MSK:   Moves extremities without difficulty  Other:    Medical Decision Making  Medically screening exam initiated at 5:45 PM.  Appropriate orders placed.  Paula Wong was informed that the remainder of the evaluation will be completed by another provider, this initial triage assessment does not replace that evaluation, and the importance of remaining in the ED until their evaluation is complete.     Paula Wong LABOR, PA-C 05/05/24 [phone]

## 2024-05-05 NOTE — Telephone Encounter (Signed)
 Opened in error

## 2024-05-05 NOTE — Telephone Encounter (Signed)
 Pt aware and voiced understanding Reports she will have her friend drive to the ER

## 2024-05-05 NOTE — ED Triage Notes (Signed)
 PT present with lightheadedness and syncopal episode today at 130. Pt states she felt lightheaded and st on the ground everything went black for unknown amount of time. Pt states was here Monday for the same and discharge home. Also complains of chest pain with breathing. Denies any injury

## 2024-05-05 NOTE — Telephone Encounter (Signed)
 Patient called to report another event of syncope this AM. Reports she woke up nauseous and not feeling well. Was able to make make breakfast and start her day  Reports she collapsed on the floor around 1330. Reports she felt light headed and slowly sat on the floor. Reports she DID loose consciousness for ~ 4 mins   B/p taken shortly after  the event 117/89 HR 91  Reports coreg  was recently changed,however these symptoms were present before meds were changed 9/11  No CP at the time No HA at the time Reports no blurred vision, but vision becomes heavy   Please advise

## 2024-05-06 ENCOUNTER — Encounter (HOSPITAL_COMMUNITY): Payer: Self-pay | Admitting: Internal Medicine

## 2024-05-06 ENCOUNTER — Observation Stay (HOSPITAL_BASED_OUTPATIENT_CLINIC_OR_DEPARTMENT_OTHER)

## 2024-05-06 ENCOUNTER — Other Ambulatory Visit: Payer: Self-pay

## 2024-05-06 DIAGNOSIS — I459 Conduction disorder, unspecified: Secondary | ICD-10-CM | POA: Diagnosis not present

## 2024-05-06 DIAGNOSIS — R55 Syncope and collapse: Secondary | ICD-10-CM

## 2024-05-06 DIAGNOSIS — I472 Ventricular tachycardia, unspecified: Principal | ICD-10-CM

## 2024-05-06 LAB — CBC WITH DIFFERENTIAL/PLATELET
Abs Immature Granulocytes: 0.01 K/uL (ref 0.00–0.07)
Basophils Absolute: 0 K/uL (ref 0.0–0.1)
Basophils Relative: 1 %
Eosinophils Absolute: 0.1 K/uL (ref 0.0–0.5)
Eosinophils Relative: 2 %
HCT: 49.4 % — ABNORMAL HIGH (ref 36.0–46.0)
Hemoglobin: 15.4 g/dL — ABNORMAL HIGH (ref 12.0–15.0)
Immature Granulocytes: 0 %
Lymphocytes Relative: 35 %
Lymphs Abs: 1.7 K/uL (ref 0.7–4.0)
MCH: 27.5 pg (ref 26.0–34.0)
MCHC: 31.2 g/dL (ref 30.0–36.0)
MCV: 88.2 fL (ref 80.0–100.0)
Monocytes Absolute: 0.7 K/uL (ref 0.1–1.0)
Monocytes Relative: 14 %
Neutro Abs: 2.4 K/uL (ref 1.7–7.7)
Neutrophils Relative %: 48 %
Platelets: 245 K/uL (ref 150–400)
RBC: 5.6 MIL/uL — ABNORMAL HIGH (ref 3.87–5.11)
RDW: 13.9 % (ref 11.5–15.5)
WBC: 5 K/uL (ref 4.0–10.5)
nRBC: 0 % (ref 0.0–0.2)

## 2024-05-06 LAB — COMPREHENSIVE METABOLIC PANEL WITH GFR
ALT: 16 U/L (ref 0–44)
AST: 21 U/L (ref 15–41)
Albumin: 4 g/dL (ref 3.5–5.0)
Alkaline Phosphatase: 58 U/L (ref 38–126)
Anion gap: 13 (ref 5–15)
BUN: 11 mg/dL (ref 8–23)
CO2: 28 mmol/L (ref 22–32)
Calcium: 9.9 mg/dL (ref 8.9–10.3)
Chloride: 103 mmol/L (ref 98–111)
Creatinine, Ser: 1.11 mg/dL — ABNORMAL HIGH (ref 0.44–1.00)
GFR, Estimated: 51 mL/min — ABNORMAL LOW (ref >60)
Glucose, Bld: 100 mg/dL — ABNORMAL HIGH (ref 70–99)
Potassium: 4.1 mmol/L (ref 3.5–5.1)
Sodium: 144 mmol/L (ref 135–145)
Total Bilirubin: 1.3 mg/dL — ABNORMAL HIGH (ref 0.0–1.2)
Total Protein: 7.8 g/dL (ref 6.5–8.1)

## 2024-05-06 LAB — ECHOCARDIOGRAM COMPLETE
Area-P 1/2: 5.5 cm2
S' Lateral: 4.91 cm
Weight: 3033.53 [oz_av]

## 2024-05-06 LAB — GLUCOSE, CAPILLARY
Glucose-Capillary: 137 mg/dL — ABNORMAL HIGH (ref 70–99)
Glucose-Capillary: 115 mg/dL — ABNORMAL HIGH (ref 70–99)
Glucose-Capillary: 183 mg/dL — ABNORMAL HIGH (ref 70–99)

## 2024-05-06 LAB — TROPONIN I (HIGH SENSITIVITY): Troponin I (High Sensitivity): 11 ng/L (ref <18)

## 2024-05-06 LAB — HEMOGLOBIN A1C
Hgb A1c MFr Bld: 6 % — ABNORMAL HIGH (ref 4.8–5.6)
Mean Plasma Glucose: 125.5 mg/dL

## 2024-05-06 LAB — MAGNESIUM: Magnesium: 2.1 mg/dL (ref 1.7–2.4)

## 2024-05-06 LAB — CBG MONITORING, ED: Glucose-Capillary: 182 mg/dL — ABNORMAL HIGH (ref 70–99)

## 2024-05-06 MED ORDER — FUROSEMIDE 20 MG PO TABS
20.0000 mg | ORAL_TABLET | Freq: Every morning | ORAL | Status: DC
  Administered 2024-05-06 – 2024-05-08 (×3): 20 mg via ORAL
  Filled 2024-05-06 (×3): qty 1

## 2024-05-06 MED ORDER — SODIUM FLUORIDE 1.1 % DT PSTE
1.0000 | PASTE | Freq: Every day | DENTAL | Status: DC
Start: 2024-05-06 — End: 2024-05-06

## 2024-05-06 MED ORDER — AMIODARONE HCL IN DEXTROSE 360-4.14 MG/200ML-% IV SOLN
60.0000 mg/h | INTRAVENOUS | Status: AC
  Administered 2024-05-06: 60 mg/h via INTRAVENOUS
  Filled 2024-05-06 (×2): qty 200

## 2024-05-06 MED ORDER — MELATONIN 3 MG PO TABS: 3.0000 mg | ORAL_TABLET | Freq: Every evening | ORAL | Status: DC | PRN

## 2024-05-06 MED ORDER — ONDANSETRON HCL 4 MG/2ML IJ SOLN: 4.0000 mg | Freq: Four times a day (QID) | INTRAMUSCULAR | Status: DC | PRN

## 2024-05-06 MED ORDER — SACUBITRIL-VALSARTAN 97-103 MG PO TABS
1.0000 | ORAL_TABLET | Freq: Two times a day (BID) | ORAL | Status: DC
  Administered 2024-05-06 – 2024-05-08 (×5): 1 via ORAL
  Filled 2024-05-06 (×6): qty 1

## 2024-05-06 MED ORDER — ACETAMINOPHEN 325 MG PO TABS: 650.0000 mg | ORAL_TABLET | Freq: Four times a day (QID) | ORAL | Status: DC | PRN

## 2024-05-06 MED ORDER — INSULIN GLARGINE 100 UNIT/ML ~~LOC~~ SOLN
80.0000 [IU] | Freq: Every day | SUBCUTANEOUS | Status: DC
Start: 2024-05-06 — End: 2024-05-08
  Administered 2024-05-06 – 2024-05-07 (×2): 80 [IU] via SUBCUTANEOUS
  Filled 2024-05-06 (×7): qty 0.8

## 2024-05-06 MED ORDER — ACETAMINOPHEN 650 MG RE SUPP: 650.0000 mg | Freq: Four times a day (QID) | RECTAL | Status: DC | PRN

## 2024-05-06 MED ORDER — PANTOPRAZOLE SODIUM 20 MG PO TBEC
20.0000 mg | DELAYED_RELEASE_TABLET | Freq: Every day | ORAL | Status: DC
  Administered 2024-05-06 – 2024-05-08 (×3): 20 mg via ORAL
  Filled 2024-05-06 (×3): qty 1

## 2024-05-06 MED ORDER — DAPAGLIFLOZIN PROPANEDIOL 10 MG PO TABS
10.0000 mg | ORAL_TABLET | Freq: Every morning | ORAL | Status: DC
  Administered 2024-05-07 – 2024-05-08 (×2): 10 mg via ORAL
  Filled 2024-05-06 (×2): qty 1

## 2024-05-06 MED ORDER — PERFLUTREN LIPID MICROSPHERE
1.0000 mL | INTRAVENOUS | Status: DC | PRN
  Administered 2024-05-06: 3 mL via INTRAVENOUS

## 2024-05-06 MED ORDER — CARVEDILOL 25 MG PO TABS
25.0000 mg | ORAL_TABLET | Freq: Two times a day (BID) | ORAL | Status: DC
  Administered 2024-05-06 – 2024-05-08 (×5): 25 mg via ORAL
  Filled 2024-05-06 (×3): qty 1
  Filled 2024-05-06: qty 2
  Filled 2024-05-06: qty 1

## 2024-05-06 MED ORDER — AMIODARONE LOAD VIA INFUSION
150.0000 mg | Freq: Once | INTRAVENOUS | Status: AC
  Administered 2024-05-06: 150 mg via INTRAVENOUS
  Filled 2024-05-06: qty 83.34

## 2024-05-06 MED ORDER — ASPIRIN 81 MG PO TBEC
81.0000 mg | DELAYED_RELEASE_TABLET | Freq: Every morning | ORAL | Status: DC
  Administered 2024-05-06 – 2024-05-08 (×3): 81 mg via ORAL
  Filled 2024-05-06 (×3): qty 1

## 2024-05-06 MED ORDER — ATORVASTATIN CALCIUM 80 MG PO TABS
80.0000 mg | ORAL_TABLET | Freq: Every day | ORAL | Status: DC
  Administered 2024-05-06 – 2024-05-08 (×3): 80 mg via ORAL
  Filled 2024-05-06: qty 1
  Filled 2024-05-06: qty 2
  Filled 2024-05-06: qty 1

## 2024-05-06 MED ORDER — MAGNESIUM OXIDE -MG SUPPLEMENT 400 (240 MG) MG PO TABS
400.0000 mg | ORAL_TABLET | Freq: Every day | ORAL | Status: DC
  Administered 2024-05-06 – 2024-05-08 (×3): 400 mg via ORAL
  Filled 2024-05-06 (×3): qty 1

## 2024-05-06 MED ORDER — AMIODARONE HCL 200 MG PO TABS
200.0000 mg | ORAL_TABLET | Freq: Every day | ORAL | Status: DC
  Administered 2024-05-06: 200 mg via ORAL
  Filled 2024-05-06: qty 1

## 2024-05-06 MED ORDER — INSULIN ASPART 100 UNIT/ML IJ SOLN
0.0000 [IU] | Freq: Three times a day (TID) | INTRAMUSCULAR | Status: DC
  Administered 2024-05-06: 0 [IU] via SUBCUTANEOUS
  Administered 2024-05-06 – 2024-05-07 (×3): 1 [IU] via SUBCUTANEOUS

## 2024-05-06 MED ORDER — AMIODARONE HCL 200 MG PO TABS: 400.0000 mg | ORAL_TABLET | Freq: Two times a day (BID) | ORAL | Status: DC

## 2024-05-06 MED ORDER — ADULT MULTIVITAMIN W/MINERALS CH
1.0000 | ORAL_TABLET | Freq: Every morning | ORAL | Status: DC
  Administered 2024-05-06 – 2024-05-08 (×3): 1 via ORAL
  Filled 2024-05-06 (×3): qty 1

## 2024-05-06 MED ORDER — CLOPIDOGREL BISULFATE 75 MG PO TABS
75.0000 mg | ORAL_TABLET | Freq: Every day | ORAL | Status: DC
  Administered 2024-05-06 – 2024-05-08 (×3): 75 mg via ORAL
  Filled 2024-05-06 (×3): qty 1

## 2024-05-06 MED ORDER — ACETAMINOPHEN 500 MG PO TABS
1000.0000 mg | ORAL_TABLET | Freq: Four times a day (QID) | ORAL | Status: DC | PRN
Start: 2024-05-06 — End: 2024-05-08

## 2024-05-06 MED ORDER — GABAPENTIN 300 MG PO CAPS
600.0000 mg | ORAL_CAPSULE | Freq: Two times a day (BID) | ORAL | Status: DC
Start: 2024-05-06 — End: 2024-05-08
  Administered 2024-05-06 – 2024-05-08 (×5): 600 mg via ORAL
  Filled 2024-05-06 (×5): qty 2

## 2024-05-06 MED ORDER — INSULIN ASPART 100 UNIT/ML IJ SOLN
2.0000 [IU] | Freq: Three times a day (TID) | INTRAMUSCULAR | Status: DC
  Administered 2024-05-06 – 2024-05-07 (×6): 2 [IU] via SUBCUTANEOUS

## 2024-05-06 MED ORDER — AMIODARONE HCL IN DEXTROSE 360-4.14 MG/200ML-% IV SOLN
30.0000 mg/h | INTRAVENOUS | Status: AC
  Administered 2024-05-06 – 2024-05-07 (×2): 30 mg/h via INTRAVENOUS
  Filled 2024-05-06 (×2): qty 200

## 2024-05-06 NOTE — ED Notes (Signed)
 Meal tray provided.

## 2024-05-06 NOTE — ED Notes (Signed)
 BGL 182. Error in transmitting from glucometer.

## 2024-05-06 NOTE — ED Provider Notes (Signed)
  EMERGENCY DEPARTMENT AT [facility] Provider Note   CSN: [id number] Arrival date & time: 05/05/24  1719     Patient presents with: Loss of Consciousness   Paula Wong is a 78 y.o. female patient presents to ED due to loss of consciousness.  The patient has history of CAD, diabetes, diverticulosis, GERD, hyperlipidemia, hypertension, ischemic cardiomyopathy, ventricular tachycardia, CKD stage II, acute on chronic HFrEF.  Patient presents to ED for evaluation of syncope.  States that today she was at her kitchen counter around 130.  States that she became very lightheaded all of a sudden and lowered her self to the ground.  She reports she then passed out, blacked out, she is unsure for how long.  She reports she woke up, contacted her cardiology office who advised her to come to the ED.  She denies chest pain, shortness of breath prior to this.  She reports that she also passed out this last Friday and was seen on Monday with a reassuring workup and was ultimately discharged.  The patient did have her metoprolol  dose adjusted and she was advised to take 12.5 mg twice daily as opposed to 25 mg.  The patient reports that she has been doing this.  Patient reports that her syncopal episodes have been occurring for the last 2 months.  She states that she was initially seen for this by PCP who advised her she was dehydrated.  She states that she has been taking excessive amounts of water recently because of this.  She states typically when she gets his episodes she will drink water and that will relieve her lightheadedness but states that today this did not work.  She denies nausea vomiting, diarrhea.  She denies any leg swelling, lightheadedness, dizziness, weakness, chest pain or shortness on my exam.  She denies any her head this evening.  She denies neck pain.   Loss of Consciousness      Prior to Admission medications   Medication Sig Start Date End Date Taking?  Authorizing Provider  acetaminophen  (TYLENOL ) 325 MG tablet Take 2 tablets (650 mg total) by mouth every 4 (four) hours as needed for headache or mild pain (pain score 1-3). 07/01/23   Lesia Ozell Barter, PA-C  amiodarone  (PACERONE ) 200 MG tablet Take 200 mg by mouth daily.    [provider]  aspirin  81 MG chewable tablet Chew 1 tablet (81 mg total) by mouth daily. 01/03/23   Darryle Thom CROME, PA-C  atorvastatin  (LIPITOR ) 80 MG tablet Take 80 mg by mouth at bedtime. 04/18/22   [provider]  B-D ULTRAFINE III SHORT PEN 31G X 8 MM MISC Inject into the skin daily. 09/23/23   [provider]  carvedilol  (COREG ) 25 MG tablet Take 0.5 tablets (12.5 mg total) by mouth 2 (two) times daily with a meal. 05/03/24   Simon Lavonia SAILOR, MD  clopidogrel  (PLAVIX ) 75 MG tablet Take 1 tablet (75 mg total) by mouth daily. 01/02/23   Darryle Thom CROME, PA-C  FARXIGA  10 MG TABS tablet Take 10 mg by mouth daily. 03/14/22   [provider]  furosemide  (LASIX ) 20 MG tablet Take 1 tablet (20 mg total) by mouth daily. May take an additional tablet as needed for swelling/SOB 11/04/23   Sabharwal, Aditya, DO  gabapentin  (NEURONTIN ) 600 MG tablet Take 600 mg by mouth 2 (two) times daily. 08/17/22   [provider]  magnesium  oxide (MAG-OX) 400 MG tablet Take 400 mg by mouth daily.  [provider]  nitroGLYCERIN  (NITROSTAT ) 0.4 MG SL tablet Place 1 tablet (0.4 mg total) under the tongue every 5 (five) minutes x 3 doses as needed for chest pain. 02/17/24   Sabharwal, Aditya, DO  ONETOUCH ULTRA test strip USE TO CHECK BLOOD SUGARS ONCE DAILY AND AS NEEDED 01/24/23   [provider]  OZEMPIC, 1 MG/DOSE, 4 MG/3ML SOPN Inject 1 mg into the skin once a week. 12/01/22   [provider]  pantoprazole  (PROTONIX ) 20 MG tablet Take 1 tablet (20 mg total) by mouth daily. 03/08/24   [place], PA-C  sacubitril -valsartan  (ENTRESTO ) 97-103 MG Take 1 tablet by mouth 2 (two) times  daily. 03/06/23   Sabharwal, Aditya, DO  SODIUM FLUORIDE  5000 PPM 1.1 % PSTE Take 1 Application by mouth daily. 12/12/23   [provider]  spironolactone  (ALDACTONE ) 25 MG tablet Take 1 tablet (25 mg total) by mouth daily. 01/24/23   Colletta Manuelita Garre, PA-C  TRESIBA FLEXTOUCH 200 UNIT/ML SOPN Take 80 Units by mouth at bedtime.  10/12/18   [provider]    Allergies: Patient has no known allergies.    Review of Systems  Cardiovascular:  Positive for syncope.  Neurological:  Positive for syncope.  All other systems reviewed and are negative.   Updated Vital Signs BP (!) 125/92   Pulse 71   Temp 98.6 F (37 C)   Resp 18   Wt 86 kg   SpO2 95%   BMI 31.55 kg/m   Physical Exam Vitals and nursing note reviewed.  Constitutional:      General: She is not in acute distress.    Appearance: She is well-developed.  HENT:     Head: Normocephalic and atraumatic.  Eyes:     Conjunctiva/sclera: Conjunctivae normal.  Cardiovascular:     Rate and Rhythm: Normal rate and regular rhythm.     Heart sounds: No murmur heard. Pulmonary:     Effort: Pulmonary effort is normal. No respiratory distress.     Breath sounds: Normal breath sounds.  Abdominal:     Palpations: Abdomen is soft.     Tenderness: There is no abdominal tenderness.  Musculoskeletal:        General: No swelling.     Cervical back: Neck supple.     Right lower leg: No edema.     Left lower leg: No edema.  Skin:    General: Skin is warm and dry.     Capillary Refill: Capillary refill takes less than 2 seconds.  Neurological:     General: No focal deficit present.     Mental Status: She is alert and oriented to person, place, and time. Mental status is at baseline.     GCS: GCS eye subscore is 4. GCS verbal subscore is 5. GCS motor subscore is 6.     Cranial Nerves: Cranial nerves 2-12 are intact. No cranial nerve deficit.     Sensory: Sensation is intact. No sensory deficit.     Motor: Motor  function is intact. No weakness.     Coordination: Coordination is intact. Heel to [facility] Test normal.     Comments: CN III through XII intact.  Intact finger-nose, heel-to-shin.  No pronator drift.  No slurred speech.  Equal strength throughout.  Equal sensation throughout.  PERRL.  Tracks cross midline.  Alert and oriented x 4.  Psychiatric:        Mood and Affect: Mood normal.     (all labs ordered are listed,  but only abnormal results are displayed) Labs Reviewed  COMPREHENSIVE METABOLIC PANEL WITH GFR - Abnormal; Notable for the following components:      Result Value   Glucose, Bld 114 (*)    Creatinine, Ser 1.07 (*)    Total Bilirubin 1.5 (*)    GFR, Estimated 53 (*)    All other components within normal limits  CBC - Abnormal; Notable for the following components:   RBC 5.21 (*)    All other components within normal limits  URINALYSIS, ROUTINE W REFLEX MICROSCOPIC - Abnormal; Notable for the following components:   APPearance HAZY (*)    Glucose, UA >=500 (*)    Leukocytes,Ua LARGE (*)    Bacteria, UA RARE (*)    All other components within normal limits  CBG MONITORING, ED - Abnormal; Notable for the following components:   Glucose-Capillary 115 (*)    All other components within normal limits  PROTIME-INR  CBC WITH DIFFERENTIAL/PLATELET  COMPREHENSIVE METABOLIC PANEL WITH GFR  MAGNESIUM   CBG MONITORING, ED  TROPONIN I (HIGH SENSITIVITY)  TROPONIN I (HIGH SENSITIVITY)    EKG: EKG Interpretation Date/Time:  Wednesday May 05 2024 17:33:06 EDT Ventricular Rate:  91 PR Interval:  190 QRS Duration:  116 QT Interval:  414 QTC Calculation: 509 R Axis:   -17  Text Interpretation: Sinus rhythm with frequent Premature ventricular complexes Incomplete right bundle branch block Minimal voltage criteria for LVH, may be normal variant ( Cornell product ) Septal infarct , age undetermined Abnormal ECG Confirmed by Griselda Norris [phone]) on 05/06/2024 2:06:26  AM  Radiology: No results found.  Procedures   Medications Ordered in the ED  acetaminophen  (TYLENOL ) tablet 650 mg (has no administration in time range)    Or  acetaminophen  (TYLENOL ) suppository 650 mg (has no administration in time range)  melatonin tablet 3 mg (has no administration in time range)  ondansetron  (ZOFRAN ) injection 4 mg (has no administration in time range)     Medical Decision Making Amount and/or Complexity of Data Reviewed Labs: ordered.  Risk Decision regarding hospitalization.   This is a 78 year old female presenting to the ED out of concern of syncope.  On examination, hemodynamically stable.  Afebrile and nontachycardic.  Lung sounds clear bilaterally, no hypoxia.  Abdomen soft and compressible.  Neurological examination is at baseline.  No edema to bilateral lower extremities.  Overall patient nontoxic in appearance.  Patient labwork initiated in triage includes CBC, CMP, urinalysis, PT/INR, troponin x 2, EKG.  Patient CBC here without leukocytosis or anemia.  Metabolic panel with baseline creatinine, bilirubin 1.5, GFR 53.  No abdominal tenderness.  Urinalysis negative for nitrates, does show large leukocytes and rare bacteria but patient denies dysuria.  PT/INR WNL.  Troponins are flat.  EKG nonischemic.  Patient here with syncope for the second time in the last 1 week.  Patient has history of ischemic cardiomyopathy.  Patient reports she lives at home alone.  Due to above listed factors, have decided to admit patient to the hospital for syncope workup.  Discussed patient with Dr. Marcene of the [facility] hospitalist service.  Dr. Marcene has agreed to admit the patient.  The patient is amenable to plan.  Stable on admission.   Final diagnoses:  Syncope and collapse    ED Discharge Orders     None          Ruthell Lonni JULIANNA DEVONNA 05/06/24 0326    Griselda Norris, MD 05/06/24 2248

## 2024-05-06 NOTE — ED Notes (Signed)
 Pt pacemaker interrogated, [employer].

## 2024-05-06 NOTE — Progress Notes (Signed)
  Carryover admission to the Day Admitter.  I discussed this case with the EDP, Lonni Lites, PA.  Per these discussions:   This is a 78 year old female with history of chronic systolic heart failure in the setting of ischemic cardiomyopathy status post ICD who is being admitted for further syncope workup, after presenting with an episode of loss of consciousness that occurred earlier today.  Patient reports that she was in her normal state of health earlier today, standing in the kitchen, she suddenly became dizzy, lightheaded, with the objective sensation of impending loss of consciousness, before experiencing a formal loss of consciousness.  While she was experiencing her dizziness, lightheadedness, she began to lower herself to the ground, denying no significant fall as a consequence of her ensuing episode of loss consciousness.  Denies any associated, preceding, or ensuing chest pain or shortness of breath.  She notes that this episode was not associated with any tongue biting or loss of bowel/bladder function.  She conveys that she has been experiencing similar episodes of syncope with prodrome over the course of the last 2 months, noting that these episodes are becoming more frequent over the last 2 weeks.  In response to the increasing frequency of her episodes of syncope, she was encouraged by an outpatient provider to decrease the dose of her home beta-blocker, which she has done.  After experiencing the episode of syncope today, she contacted her outpatient cardiologist, who recommended that she present to the emergency department for further evaluation and management thereof.  Most recent echocardiogram occurred in March 2025, with findings at that time notable for LVEF 25 to 30%.  She has a history of ICD placement, [employer].   ED today, her initial troponin was 6, with repeat value trending up to 10.  I have placed an order for observation to med/tele for further  evaluation management of the above.  I have placed some additional preliminary admit orders via the adult multi-morbid admission order set. I have also ordered repeat troponin to be checked in the morning, along with echocardiogram in the morning.  Have ordered fall precautions, and communication order requesting by orthostatic vital signs be checked and documented.  Also placed order to interrogate her [facility] Scientific ICD, and have ordered morning labs in the form of CMP, CBC, magnesium  level.    Eva Pore, DO Hospitalist

## 2024-05-06 NOTE — Consult Note (Signed)
 ELECTROPHYSIOLOGY CONSULT NOTE    Patient ID: Paula Wong MRN: [id number], DOB/AGE: 79-Dec-1947 79 y.o.  Admit date: 05/05/2024 Date of Consult: 05/06/2024  Primary Physician: Leontine Cramp, NP Primary Cardiologist: Aleene Passe, MD (Inactive)  Electrophysiologist: Dr. Cindie   Referring Provider: Dr. Jonel  Patient Profile: Paula Wong is a 78 y.o. female with a history of HFrEF due to ICM s/p ICD, PMVT (s/p ICD shock in 12/2022, 06/2023), PVC's, CAD s/p PCI to LAD, HTN, HLD, CVA, ILR implanted after CVA, DM II who is being seen today for the evaluation of ICD shock at the request of Dr. Jonel.  HPI:  Paula Wong is a 78 y.o. female who presented to [facility] ER on 05/05/24 with reports of syncope.    She was seen in the ER on 05/03/24 for lightheadedness and mid sternal chest pain. Her metoprolol  was decreased back to 12.5 mg BID.  She was monitored on tele with notes of high PVC burden and documentation of tele being consistent with her pacemaker interrogation.  K+ wnl, Mg 1.8 and was replaced. TSH normal.   [facility]24 with reports of waking up feeling nauseated, unable to eat. Around lunch time, she felt light headed and slowly sat on the floor. She passed out for what she thinks is 4 minutes.  Her BP after the event was 117/89.  She was encouraged to report to the ER. Initial labs notable for K+ 3.9, Cr 10.7, Mg+ 2.1. Troponin cycled and flat. She was admitted per [facility] to further evaluate syncope.  The patient reported compliance with amiodarone  on admit as well as other medications.    ICD review shows VT alert at 1324 with rates as high as 205, she received ATP x3 and hen subsequent 41j shock.  After the 1st ATP, she was noted to have dual tachycardia with AF and VT.  Shock with clean break.   He denies chest pain, palpitations, dyspnea, PND, orthopnea, nausea, vomiting, dizziness, syncope, edema, weight gain, or early  satiety.   Labs Potassium4.1 (09/18 0432) Magnesium   2.1 (09/18 0432) Creatinine, ser  1.11* (09/18 0432) PLT  245 (09/18 0432) HGB  15.4* (09/18 0432) WBC 5.0 (09/18 0432) Troponin I (High Sensitivity)11 (09/18 0432).    Past Medical History:  Diagnosis Date   Allergy    CAD (coronary artery disease)    a. 03/2022 Cath: LM 40ost, 36m, LAD 40ost/p, 2m, 49m, LCX 48m, RCA nl, RV branch 70-->Med rx.   Chronic HFrEF (heart failure with reduced ejection fraction) (HCC)    a. 11/2021 Echo: EF 20-25%; b. 03/2022 Echo: EF 20-25%; c. 11/2022 Echo: EF 25-30%, glob HK.   Diabetes (HCC)    Diverticulosis    GERD (gastroesophageal reflux disease)    Hiatal hernia    Hyperlipidemia    Hypertension    Implantable loop recorder present    a. 03/2022 s/p Abbott IQ EL+ (serial [id number]) ILR in setting of cryptogenic stroke.   Ischemic cardiomyopathy    a. 11/2021 Echo: EF 20-25%; b. 03/2022 Echo: EF 20-25%; c. 11/2022 Echo: EF 25-30%, glob HK, nl RV fxn, mild-mod MR, mild AI, AoV sclerosis.   Neuropathy    feet   Schatzki's ring    Stroke [facility])    a. 03/2022 MRI Brain: subacute cortical infarct involving inf and med L occipial pole w/ petechial hemorrhage; b. 03/2022 s/p Abbott Assert IQ EL ILR.   Ventricular tachycardia (HCC)    a. 12/2022 noted on  ILR.     Surgical History:  Past Surgical History:  Procedure Laterality Date   CORONARY LITHOTRIPSY N/A 12/31/2022   Procedure: CORONARY LITHOTRIPSY;  Surgeon: Swaziland, Peter M, MD;  Location: [facility] INVASIVE CV LAB;  Service: Cardiovascular;  Laterality: N/A;   CORONARY STENT INTERVENTION N/A 12/31/2022   Procedure: CORONARY STENT INTERVENTION;  Surgeon: Swaziland, Peter M, MD;  Location: [facility] INVASIVE CV LAB;  Service: Cardiovascular;  Laterality: N/A;   CORONARY ULTRASOUND/IVUS N/A 12/31/2022   Procedure: Coronary Ultrasound/IVUS;  Surgeon: Swaziland, Peter M, MD;  Location: [facility] INVASIVE CV LAB;  Service: Cardiovascular;  Laterality: N/A;   ICD IMPLANT N/A  06/30/2023   Procedure: ICD IMPLANT;  Surgeon: Cindie Ole DASEN, MD;  Location: [facility] INVASIVE CV LAB;  Service: Cardiovascular;  Laterality: N/A;   LEFT HEART CATH AND CORONARY ANGIOGRAPHY N/A 04/16/2022   Procedure: LEFT HEART CATH AND CORONARY ANGIOGRAPHY;  Surgeon: Burnard Debby LABOR, MD;  Location: [facility] INVASIVE CV LAB;  Service: Cardiovascular;  Laterality: N/A;   LEFT HEART CATH AND CORONARY ANGIOGRAPHY N/A 12/31/2022   Procedure: LEFT HEART CATH AND CORONARY ANGIOGRAPHY;  Surgeon: Swaziland, Peter M, MD;  Location: [facility] INVASIVE CV LAB;  Service: Cardiovascular;  Laterality: N/A;   LOOP RECORDER INSERTION N/A 04/15/2022   Procedure: LOOP RECORDER INSERTION;  Surgeon: Cindie Ole DASEN, MD;  Location: [facility] INVASIVE CV LAB;  Service: Cardiovascular;  Laterality: N/A;   MASS EXCISION Right 06/07/2019   Procedure: LOYD OF KELOID TO RIGHT NECK;  Surgeon: Marcus Lung, MD;  Location: [facility];  Service: Plastics;  Laterality: Right;   TOOTH EXTRACTION  2013     Medications Prior to Admission  Medication Sig Dispense Refill Last Dose/Taking   acetaminophen  (TYLENOL ) 500 MG tablet Take 1,000 mg by mouth every 6 (six) hours as needed for mild pain (pain score 1-3) or headache.   05/04/2024   aspirin  EC 81 MG tablet Take 81 mg by mouth every morning. Swallow whole.   05/05/2024 Morning   atorvastatin  (LIPITOR ) 80 MG tablet Take 80 mg by mouth at bedtime.   05/04/2024   carvedilol  (COREG ) 25 MG tablet Take 0.5 tablets (12.5 mg total) by mouth 2 (two) times daily with a meal. (Patient taking differently: Take 25 mg by mouth 2 (two) times daily after a meal.) 180 tablet 3 05/05/2024 at  8:00 AM   clopidogrel  (PLAVIX ) 75 MG tablet Take 1 tablet (75 mg total) by mouth daily. (Patient taking differently: Take 75 mg by mouth every morning.) 30 tablet 11 05/05/2024 at  8:00 AM   FARXIGA  10 MG TABS tablet Take 10 mg by mouth every morning.   05/05/2024 at  8:00 AM   furosemide  (LASIX ) 20 MG tablet Take  1 tablet (20 mg total) by mouth daily. May take an additional tablet as needed for swelling/SOB (Patient taking differently: Take 20 mg by mouth every morning. May take an additional tablet as needed for swelling/SOB) 100 tablet 3 05/05/2024 at  8:00 AM   gabapentin  (NEURONTIN ) 600 MG tablet Take 600 mg by mouth 2 (two) times daily with a meal.   05/05/2024 Morning   magnesium  oxide (MAG-OX) 400 MG tablet Take 400 mg by mouth daily.   05/05/2024 at  8:00 AM   Multiple Vitamins-Minerals (CENTRUM SILVER 50+WOMEN) TABS Take 1 tablet by mouth every morning.   05/05/2024 Morning   nitroGLYCERIN  (NITROSTAT ) 0.4 MG SL tablet Place 1 tablet (0.4 mg total) under the tongue every 5 (five) minutes x 3 doses as needed  for chest pain. 25 tablet 5 Unknown   pantoprazole  (PROTONIX ) 20 MG tablet Take 1 tablet (20 mg total) by mouth daily. 90 tablet 3 05/05/2024 at  8:00 AM   sacubitril -valsartan  (ENTRESTO ) 97-103 MG Take 1 tablet by mouth 2 (two) times daily. (Patient taking differently: Take 1 tablet by mouth 2 (two) times daily with a meal.) 60 tablet 5 05/05/2024 at  8:00 AM   Semaglutide, 1 MG/DOSE, (OZEMPIC, 1 MG/DOSE,) 4 MG/3ML SOPN Inject 1 mg into the skin once a week. Inject 1mg  subcutaneously once per week on Wednesday.   04/28/2024   SODIUM FLUORIDE  5000 PPM 1.1 % PSTE Take 1 Application by mouth daily.   05/05/2024 at  8:00 AM   spironolactone  (ALDACTONE ) 25 MG tablet Take 1 tablet (25 mg total) by mouth daily. 90 tablet 3 05/05/2024 at  8:00 AM   TRESIBA FLEXTOUCH 200 UNIT/ML SOPN Take 80 Units by mouth at bedtime.    05/04/2024    Inpatient Medications:   amiodarone   200 mg Oral Daily   aspirin  EC  81 mg Oral q morning   atorvastatin   80 mg Oral Daily   carvedilol   25 mg Oral BID PC   clopidogrel   75 mg Oral Daily   dapagliflozin  propanediol  10 mg Oral q morning   furosemide   20 mg Oral q morning   gabapentin   600 mg Oral BID WC   insulin  aspart  0-6 Units Subcutaneous TID WC   insulin  aspart  2 Units  Subcutaneous TID WC   insulin  glargine  80 Units Subcutaneous QHS   magnesium  oxide  400 mg Oral Daily   multivitamin with minerals  1 tablet Oral q morning   pantoprazole   20 mg Oral Daily   sacubitril -valsartan   1 tablet Oral BID    Allergies: No Known Allergies  Family History  Problem Relation Age of Onset   Colon cancer Mother 94   Heart attack Father    Cancer Sister        female cancer   Heart attack Brother    Healthy Son    Healthy Daughter    Stomach cancer Neg Hx    Pancreatic cancer Neg Hx    Esophageal cancer Neg Hx      Physical Exam: Vitals:   05/06/24 0804 05/06/24 1056 05/06/24 1157 05/06/24 1229  BP:  109/75 126/79 126/79  Pulse:  79 74 74  Resp:  16  16  Temp: 97.9 F (36.6 C) 98.6 F (37 C) 97.7 F (36.5 C) 97.7 F (36.5 C)  TempSrc: Oral Oral  Oral  SpO2:  97% 98%   Weight:    86 kg  Height:    5' 5 (1.651 m)    GEN- NAD, A&O x 3, normal affect HEENT: Normocephalic, atraumatic Lungs- CTAB, Normal effort.  Heart- Regular rate and rhythm, No M/G/R.  GI- Soft, NT, ND.  Extremities- No clubbing, cyanosis, or edema   Radiology/Studies: DG Chest 2 View Result Date: 05/03/2024 CLINICAL DATA:  Mid chest pain.  Recent syncopal episode. EXAM: CHEST - 2 VIEW COMPARISON:  Chest radiograph dated 07/09/2023 FINDINGS: Lines/tubes: Left chest wall ICD leads project over the right atrium and ventricle. Left anterior chest wall loop recorder. Lungs: Well inflated lungs. No focal consolidation. Pleura: No pneumothorax or pleural effusion. Heart/mediastinum: Similar enlarged cardiomediastinal silhouette. Bones: T12 superior endplate compression deformity, apparently new from 07/01/2023. IMPRESSION: 1. No active cardiopulmonary disease. 2. T12 superior endplate compression deformity, apparently new from 07/01/2023. Recommend correlation with  point tenderness. 3. Similar cardiomegaly. Electronically Signed   By: Limin  Xu M.D.   On: 05/03/2024 12:35   CUP PACEART  INCLINIC DEVICE CHECK Result Date: 04/29/2024 brief in-clinic _dual__ chamber ICD check PVC burden. Presenting Rhythm: _AS/VS__ . Auto testing was performed. Thresholds, sensing, and impedance demonstrate stable parameters and no programming changes needed. No treated arrhythmias. Estimated longevity __13 years__ . Pt enrolled in remote follow-up. c/w with PVC burden appears similar to last one brief PAT see office note for discussion RU   EKG:05/05/24 SR with frequent PVC's (personally reviewed)  TELEMETRY: SR 70-90's with PVC's (personally reviewed)  DEVICE HISTORY:  Field seismologist ICD implanted 06/30/2023 for secondary prevention Abbott ILR implanted 03/26/2022 after cryptogenic stroke    Arrhythmia / AAD / Pertinent EP Studies VT / PMVT  Amiodarone  12/2022 > loaded after PCI to LAD, restarted with recurrent VT 06/2023   Assessment/Plan:  Ventricular Tachycardia s/p ICD Shock PVC's VT with cycle length 223-330 ms, s/p ATP x2 unsuccessful then 41j shock with clean break. Recent PVC burden of 21% in 02/2024.  -appears by chart review her amiodarone  fell off her med list inadvertently > can not see that a provider stopped it. She does not remember taking the medication.  -IV amiodarone  load then infusion  -would plan to keep 48-72 hours for tele monitoring  -monitor electrolytes with goal K+>4, Mg+>2  -query if she would be a PVC ablation candidate as an outpatient  -does not appear significant volume up, no recent illnesses  -will plan for EP follow up as outpatient   Hypokalemia  Hypomagnesemia  -monitor, replace as indicated   HFrEF due to ICM s/p [employer] Dual Chamber ICD - LVEF 25-30% 10/2023 CAD s/p LAD PCI  HTN HLD -per [facility]   Hx CVA s/p ILR  -per [facility]    For questions or updates, please contact [facility] Please consult [email or web address] for contact info under     Signed, Daphne Barrack, NP-C, AGACNP-BC [facility] - Electrophysiology   05/06/2024, 3:54 PM

## 2024-05-06 NOTE — Progress Notes (Addendum)
    Patient: Paula Wong FMW:[id number] DOB: [birth year]      Brief hospital course: 78 y.o. F with CAD s/p PCI May 2024, HTN, DM, HLD, VT with ICD, hx stroke who presented with syncopal episodes.    This is a no charge note, for further details, please see the H&P by my partner, Dr. Royal from earlier today.   Principal Problem:   Syncope Other hospital problems:   Hypertension   Diabetes   History VT   Coronary artery disease   Cerebrovascular disease   CKD IIIa    Chronic systolic CHF   Class I obesity   Med history review reveals she wasn't taking her amiodarone .  Doesn't have family, probably needs to consider ALF   Consult EP  Replete electrolytes     Physical Exam: BP 118/66 (BP Location: Left Arm)   Pulse 81   Temp 97.9 F (36.6 C) (Oral)   Resp 16   Ht 5' 5 (1.651 m)   Wt 86 kg   SpO2 95%   BMI 31.55 kg/m   Patient seen and examined.     Family Communication: Patient reports there are none        Author: Lonni SHAUNNA Dalton, MD 05/06/2024 5:48 PM

## 2024-05-06 NOTE — H&P (Signed)
 [facility] HPI   Paula Wong FMW:[id number] DOB: 08/29/1945 DOA: 05/05/2024 From:home  Code Status full  PCP: Leontine Cramp, NP   Chief Complaint: Persistent episodic syncope etc.  HPI:  78 year old black female VT status post [employer] ICD 06/2023-previously on amiodarone  [she has [employer] device for secondary prevention implanted 06/30/2023 as well as Abbott loop recorder implanted 04/15/2022 for the stroke)--- she remains on chronic amiodarone  DM 2 on metformin ?  Ozempic HFrEF 20-25% previously--- cardiac cath on 2023 admission showed 70% LAD stenosis patient was placed on GDMT Apparently also had stent placed to LAD May/2024 admission Subacute ischemic CVA 03/2022 admission  In EP office 9/11 reports of 21.7% PVC burden with trigeminy bigeminy-reported increase in Coreg  dose when she saw Dr. Jennings Core at June 2025 Interrogation of the device 9/11 showed PVCs (64K in 29 days) prior (368K in 179days) Observed occ PVCs while watching on programmer, not frequent She has had some NSVTs And 2 episodes in her monitor zone (1:43min and 1:32min both back in Aug) No treated VTs Was increased to Coreg  25 twice daily  Seen in ED 9/15 with no LOC passing out random not associated with positional changes-lightheaded at kitchen sink on 9/12 per EDP report at that time-could not make it to the fridge found herself on the floor did not hit her head had some midsternal pain--cardiologist was consulted recommending decreasing metoprolol  brannon she was cut back from Coreg  25 twice daily to 12.5 twice daily ]and pacemaker interrogation-had a high PVC burden but no V. tach she was stabilized in the hospital stay magnesium  was orally replaced there was no concern for CVA and she was sent home after adjustment of her metoprolol  back to 12.5 daily  Presented almost exactly the same way 9/17 was walking from her couch to the fridge to get some water next thing she knew she was  sitting on the floor did not lose continence of bowel bladder did not have any prodrome--- Tells me that when they test her PPM in the EP office she cannot tell when they slow or raise the heart rate Her chest pain is persistent since 9/15 and about the same No fever no chills   Review of Systems:  As mentioned above in HPI are pertinent +'s Pertinent negatives as per below   ED Course: Orthostatics checked in the ED and they were not really consistent with orthostasis She is pleasant coherent awake alert and tells me that she had some heaviness and 4-minute episode of chest pain down similar to what she had previously    Past Medical History:  Diagnosis Date   Allergy    CAD (coronary artery disease)    a. 03/2022 Cath: LM 40ost, 36m, LAD 40ost/p, 49m, 55m, LCX 34m, RCA nl, RV branch 70-->Med rx.   Chronic HFrEF (heart failure with reduced ejection fraction) (HCC)    a. 11/2021 Echo: EF 20-25%; b. 03/2022 Echo: EF 20-25%; c. 11/2022 Echo: EF 25-30%, glob HK.   Diabetes (HCC)    Diverticulosis    GERD (gastroesophageal reflux disease)    Hiatal hernia    Hyperlipidemia    Hypertension    Implantable loop recorder present    a. 03/2022 s/p Abbott IQ EL+ (serial [id number]) ILR in setting of cryptogenic stroke.   Ischemic cardiomyopathy    a. 11/2021 Echo: EF 20-25%; b. 03/2022 Echo: EF 20-25%; c. 11/2022 Echo: EF 25-30%, glob HK, nl RV fxn, mild-mod MR, mild AI, AoV sclerosis.   Neuropathy  feet   Schatzki's ring    Stroke [facility])    a. 03/2022 MRI Brain: subacute cortical infarct involving inf and med L occipial pole w/ petechial hemorrhage; b. 03/2022 s/p Abbott Assert IQ EL ILR.   Ventricular tachycardia (HCC)    a. 12/2022 noted on ILR.   Past Surgical History:  Procedure Laterality Date   CORONARY LITHOTRIPSY N/A 12/31/2022   Procedure: CORONARY LITHOTRIPSY;  Surgeon: Swaziland, Peter M, MD;  Location: [facility] INVASIVE CV LAB;  Service: Cardiovascular;  Laterality: N/A;   CORONARY STENT  INTERVENTION N/A 12/31/2022   Procedure: CORONARY STENT INTERVENTION;  Surgeon: Swaziland, Peter M, MD;  Location: [facility] INVASIVE CV LAB;  Service: Cardiovascular;  Laterality: N/A;   CORONARY ULTRASOUND/IVUS N/A 12/31/2022   Procedure: Coronary Ultrasound/IVUS;  Surgeon: Swaziland, Peter M, MD;  Location: [facility] INVASIVE CV LAB;  Service: Cardiovascular;  Laterality: N/A;   ICD IMPLANT N/A 06/30/2023   Procedure: ICD IMPLANT;  Surgeon: Cindie Ole DASEN, MD;  Location: [facility] INVASIVE CV LAB;  Service: Cardiovascular;  Laterality: N/A;   LEFT HEART CATH AND CORONARY ANGIOGRAPHY N/A 04/16/2022   Procedure: LEFT HEART CATH AND CORONARY ANGIOGRAPHY;  Surgeon: Burnard Debby LABOR, MD;  Location: [facility] INVASIVE CV LAB;  Service: Cardiovascular;  Laterality: N/A;   LEFT HEART CATH AND CORONARY ANGIOGRAPHY N/A 12/31/2022   Procedure: LEFT HEART CATH AND CORONARY ANGIOGRAPHY;  Surgeon: Swaziland, Peter M, MD;  Location: [facility] INVASIVE CV LAB;  Service: Cardiovascular;  Laterality: N/A;   LOOP RECORDER INSERTION N/A 04/15/2022   Procedure: LOOP RECORDER INSERTION;  Surgeon: Cindie Ole DASEN, MD;  Location: [facility] INVASIVE CV LAB;  Service: Cardiovascular;  Laterality: N/A;   MASS EXCISION Right 06/07/2019   Procedure: LOYD OF KELOID TO RIGHT NECK;  Surgeon: Marcus Lung, MD;  Location: [facility];  Service: Plastics;  Laterality: Right;   TOOTH EXTRACTION  2013    reports that she quit smoking about 20 years ago. Her smoking use included cigarettes. She has never used smokeless tobacco. She reports that she does not drink alcohol and does not use drugs.  Mobility: independat usually  No Known Allergies Family History  Problem Relation Age of Onset   Colon cancer Mother 17   Heart attack Father    Cancer Sister        female cancer   Heart attack Brother    Healthy Son    Healthy Daughter    Stomach cancer Neg Hx    Pancreatic cancer Neg Hx    Esophageal cancer Neg Hx    Prior to Admission medications    Medication Sig Start Date End Date Taking? Authorizing Provider  acetaminophen  (TYLENOL ) 325 MG tablet Take 2 tablets (650 mg total) by mouth every 4 (four) hours as needed for headache or mild pain (pain score 1-3). 07/01/23   Lesia Ozell Barter, PA-C  amiodarone  (PACERONE ) 200 MG tablet Take 200 mg by mouth daily.    [provider]  aspirin  81 MG chewable tablet Chew 1 tablet (81 mg total) by mouth daily. 01/03/23   Darryle Thom CROME, PA-C  atorvastatin  (LIPITOR ) 80 MG tablet Take 80 mg by mouth at bedtime. 04/18/22   [provider]  carvedilol  (COREG ) 25 MG tablet Take 0.5 tablets (12.5 mg total) by mouth 2 (two) times daily with a meal. 05/03/24   Simon Lavonia SAILOR, MD  clopidogrel  (PLAVIX ) 75 MG tablet Take 1 tablet (75 mg total) by mouth daily. 01/02/23   Darryle Thom CROME, PA-C  FARXIGA   10 MG TABS tablet Take 10 mg by mouth daily. 03/14/22   [provider]  furosemide  (LASIX ) 20 MG tablet Take 1 tablet (20 mg total) by mouth daily. May take an additional tablet as needed for swelling/SOB 11/04/23   Sabharwal, Aditya, DO  gabapentin  (NEURONTIN ) 600 MG tablet Take 600 mg by mouth 2 (two) times daily. 08/17/22   [provider]  magnesium  oxide (MAG-OX) 400 MG tablet Take 400 mg by mouth daily.    [provider]  nitroGLYCERIN  (NITROSTAT ) 0.4 MG SL tablet Place 1 tablet (0.4 mg total) under the tongue every 5 (five) minutes x 3 doses as needed for chest pain. 02/17/24   Sabharwal, Aditya, DO  OZEMPIC, 1 MG/DOSE, 4 MG/3ML SOPN Inject 1 mg into the skin once a week. 12/01/22   [provider]  pantoprazole  (PROTONIX ) 20 MG tablet Take 1 tablet (20 mg total) by mouth daily. 03/08/24   [place], PA-C  sacubitril -valsartan  (ENTRESTO ) 97-103 MG Take 1 tablet by mouth 2 (two) times daily. 03/06/23   Sabharwal, Aditya, DO  SODIUM FLUORIDE  5000 PPM 1.1 % PSTE Take 1 Application by mouth daily. 12/12/23   [provider]  spironolactone  (ALDACTONE )  25 MG tablet Take 1 tablet (25 mg total) by mouth daily. 01/24/23   Colletta Manuelita Garre, PA-C  TRESIBA FLEXTOUCH 200 UNIT/ML SOPN Take 80 Units by mouth at bedtime.  10/12/18   [provider]    Physical Exam:  Vitals:   05/06/24 0230 05/06/24 0245  BP: (!) 148/126 (!) 125/92  Pulse:  71  Resp:    Temp:    SpO2: 98% 95%    Very pleasant coherent jovial female no distress tongue protrudes midline Vision by direct confrontation is intact moving all 4 limbs equally shoulder shrug bilaterally equal masseter as well as frontalis intact Neck soft supple Chest clear no added sound Back of thorax she has no specific tenderness Abdomen is obese nontender no rebound no guarding No lower extremity edema Power is 5/5 sensory grossly intact  I have personally reviewed following labs and imaging studies  Labs:  Sodium 142 potassium 3.9 BUN/creatinine 10/1.0 LFTs normal bilirubin 1.5 WBC 5.6 hemoglobin 14.3 platelet 236 INR 1.1 UA large leukocytes negative nitrites rare bacteria  Imaging studies:  CXR no active cardiopulmonary disease T12 superior endplate compression deformity new from 07/01/2023  Medical tests:  EKG independently reviewed:  Freq PVC--RBB  --compare dto prior 03/30/24 no real changes other than frequency---note that poatient   Test discussed with performing physician: No  Decision to obtain old records:  Yes  Review and summation of old records:  If  Principal Problem:   Syncope   Assessment/Plan Likely arrhythmia related syncope PPM interrogation attempted but no report available it looks like it was sent via electronic/other transmission so I have asked nursing to reach out to [facility] Scientific rep to come in and interrogate again EP/cardiology should see this morning- Her troponins have remained persistently flat I do not think this is ACS I do not think this is a seizure nor stroke-her exam is really unremarkable frankly Per last EP note 9/11 they  were planning to switch her to maybe Toprol  We will defer to them in terms of decision making Continue at this time Coreg  25 twice daily keep on telemetry--she is compliant with her amiodarone  200 daily additionally Magnesium  is normal-- continue magnesium  400 daily Chronic HFrEF Stenting as above Continue GDMT but hold Aldactone  25 continue Entresto  1 twice daily, Lasix   20 daily, atorvastatin  80 Continue Farxiga  10 Her weight seems to be around 86 and was the same as it was in the office Echo is pending Do not think this is ACS Defer to cardiology Diabetes mellitus TY 2  Farxiga  as above continue Tresiba 80 at bedtime hold Ozempic for now No longer on metformin ? Continue gabapentin  600 twice daily neuropathy etc. Add sliding scale Previous stroke Continue Plavix  75 daily aspirin  81 and GDMT as above   Severity of Illness: The appropriate patient status for this patient is OBSERVATION. Observation status is judged to be reasonable and necessary in order to provide the required intensity of service to ensure the patient's safety. The patient's presenting symptoms, physical exam findings, and initial radiographic and laboratory data in the context of their medical condition is felt to place them at decreased risk for further clinical deterioration. Furthermore, it is anticipated that the patient will be medically stable for discharge from the hospital within 2 midnights of admission.    Family Communication: None  DVT ppx: SCD Consults called & Whom:  Cardiology Ms Henry contacted and will arrange for EP to see and adjust Will need EP eval once interrogation is performed and adjustment of meds by them--thank you  Time spent: >70 minutes  Royal, MD dufm my NP partners at night for Care related issues] [facility] --Via amion app OR , [email or web address]; password [facility]  05/06/2024, 5:52 AM

## 2024-05-06 NOTE — ED Notes (Signed)
 CCMD notified via phone.

## 2024-05-06 NOTE — ED Notes (Signed)
 IP unit notified of impending patient movement. Transport called.

## 2024-05-07 DIAGNOSIS — I5022 Chronic systolic (congestive) heart failure: Secondary | ICD-10-CM | POA: Diagnosis present

## 2024-05-07 DIAGNOSIS — E785 Hyperlipidemia, unspecified: Secondary | ICD-10-CM | POA: Diagnosis present

## 2024-05-07 DIAGNOSIS — Z7902 Long term (current) use of antithrombotics/antiplatelets: Secondary | ICD-10-CM | POA: Diagnosis not present

## 2024-05-07 DIAGNOSIS — E1122 Type 2 diabetes mellitus with diabetic chronic kidney disease: Secondary | ICD-10-CM | POA: Diagnosis present

## 2024-05-07 DIAGNOSIS — Z79899 Other long term (current) drug therapy: Secondary | ICD-10-CM | POA: Diagnosis not present

## 2024-05-07 DIAGNOSIS — I480 Paroxysmal atrial fibrillation: Secondary | ICD-10-CM | POA: Diagnosis present

## 2024-05-07 DIAGNOSIS — E1142 Type 2 diabetes mellitus with diabetic polyneuropathy: Secondary | ICD-10-CM | POA: Diagnosis present

## 2024-05-07 DIAGNOSIS — Z6831 Body mass index (BMI) 31.0-31.9, adult: Secondary | ICD-10-CM | POA: Diagnosis not present

## 2024-05-07 DIAGNOSIS — E876 Hypokalemia: Secondary | ICD-10-CM | POA: Diagnosis present

## 2024-05-07 DIAGNOSIS — R55 Syncope and collapse: Secondary | ICD-10-CM | POA: Diagnosis present

## 2024-05-07 DIAGNOSIS — N1831 Chronic kidney disease, stage 3a: Secondary | ICD-10-CM | POA: Diagnosis present

## 2024-05-07 DIAGNOSIS — I493 Ventricular premature depolarization: Secondary | ICD-10-CM | POA: Diagnosis present

## 2024-05-07 DIAGNOSIS — Z800 Family history of malignant neoplasm of digestive organs: Secondary | ICD-10-CM | POA: Diagnosis not present

## 2024-05-07 DIAGNOSIS — I472 Ventricular tachycardia, unspecified: Secondary | ICD-10-CM | POA: Diagnosis present

## 2024-05-07 DIAGNOSIS — I459 Conduction disorder, unspecified: Secondary | ICD-10-CM

## 2024-05-07 DIAGNOSIS — Z87891 Personal history of nicotine dependence: Secondary | ICD-10-CM | POA: Diagnosis not present

## 2024-05-07 DIAGNOSIS — Z4502 Encounter for adjustment and management of automatic implantable cardiac defibrillator: Secondary | ICD-10-CM | POA: Diagnosis not present

## 2024-05-07 DIAGNOSIS — I2510 Atherosclerotic heart disease of native coronary artery without angina pectoris: Secondary | ICD-10-CM | POA: Diagnosis present

## 2024-05-07 DIAGNOSIS — Z7985 Long-term (current) use of injectable non-insulin antidiabetic drugs: Secondary | ICD-10-CM | POA: Diagnosis not present

## 2024-05-07 DIAGNOSIS — Z7984 Long term (current) use of oral hypoglycemic drugs: Secondary | ICD-10-CM | POA: Diagnosis not present

## 2024-05-07 DIAGNOSIS — I255 Ischemic cardiomyopathy: Secondary | ICD-10-CM | POA: Diagnosis present

## 2024-05-07 DIAGNOSIS — Z7982 Long term (current) use of aspirin: Secondary | ICD-10-CM | POA: Diagnosis not present

## 2024-05-07 DIAGNOSIS — I130 Hypertensive heart and chronic kidney disease with heart failure and stage 1 through stage 4 chronic kidney disease, or unspecified chronic kidney disease: Secondary | ICD-10-CM | POA: Diagnosis present

## 2024-05-07 DIAGNOSIS — Z8249 Family history of ischemic heart disease and other diseases of the circulatory system: Secondary | ICD-10-CM | POA: Diagnosis not present

## 2024-05-07 DIAGNOSIS — K219 Gastro-esophageal reflux disease without esophagitis: Secondary | ICD-10-CM | POA: Diagnosis present

## 2024-05-07 LAB — GLUCOSE, CAPILLARY
Glucose-Capillary: 179 mg/dL — ABNORMAL HIGH (ref 70–99)
Glucose-Capillary: 160 mg/dL — ABNORMAL HIGH (ref 70–99)
Glucose-Capillary: 144 mg/dL — ABNORMAL HIGH (ref 70–99)
Glucose-Capillary: 184 mg/dL — ABNORMAL HIGH (ref 70–99)

## 2024-05-07 LAB — CBC
HCT: 42.9 % (ref 36.0–46.0)
Hemoglobin: 13.7 g/dL (ref 12.0–15.0)
MCH: 27.6 pg (ref 26.0–34.0)
MCHC: 31.9 g/dL (ref 30.0–36.0)
MCV: 86.3 fL (ref 80.0–100.0)
Platelets: 219 K/uL (ref 150–400)
RBC: 4.97 MIL/uL (ref 3.87–5.11)
RDW: 13.8 % (ref 11.5–15.5)
WBC: 4.5 K/uL (ref 4.0–10.5)
nRBC: 0 % (ref 0.0–0.2)

## 2024-05-07 LAB — BASIC METABOLIC PANEL WITH GFR
Anion gap: 8 (ref 5–15)
BUN: 10 mg/dL (ref 8–23)
CO2: 24 mmol/L (ref 22–32)
Calcium: 9 mg/dL (ref 8.9–10.3)
Chloride: 108 mmol/L (ref 98–111)
Creatinine, Ser: 0.94 mg/dL (ref 0.44–1.00)
GFR, Estimated: >60 mL/min (ref >60)
Glucose, Bld: 126 mg/dL — ABNORMAL HIGH (ref 70–99)
Potassium: 3.7 mmol/L (ref 3.5–5.1)
Sodium: 140 mmol/L (ref 135–145)

## 2024-05-07 LAB — MAGNESIUM: Magnesium: 1.7 mg/dL (ref 1.7–2.4)

## 2024-05-07 MED ORDER — POTASSIUM CHLORIDE 20 MEQ PO PACK
40.0000 meq | PACK | Freq: Once | ORAL | Status: AC
  Administered 2024-05-07: 40 meq via ORAL
  Filled 2024-05-07: qty 2

## 2024-05-07 MED ORDER — AMIODARONE HCL 200 MG PO TABS
400.0000 mg | ORAL_TABLET | Freq: Two times a day (BID) | ORAL | Status: DC
  Administered 2024-05-07 – 2024-05-08 (×2): 400 mg via ORAL
  Filled 2024-05-07 (×2): qty 2

## 2024-05-07 MED ORDER — MAGNESIUM SULFATE 2 GM/50ML IV SOLN: 2.0000 g | Freq: Once | INTRAVENOUS | Status: DC

## 2024-05-07 NOTE — Care Management Obs Status (Signed)
 MEDICARE OBSERVATION STATUS NOTIFICATION   Patient Details  Name: Paula Wong MRN: [id number] Date of Birth: 07-Oct-1945   Medicare Observation Status Notification Given:  Yes    Vonzell Arrie Sharps 05/07/2024, 9:15 AM

## 2024-05-07 NOTE — Progress Notes (Addendum)
 Rounding Note   Patient Name: Paula Wong Date of Encounter: 05/07/2024  [facility] Cardiologist: Dr. Alveta (inactive) EP: Dr. Cindie  Subjective  She feels quite well  Scheduled Meds:  aspirin  EC  81 mg Oral q morning   atorvastatin   80 mg Oral Daily   carvedilol   25 mg Oral BID PC   clopidogrel   75 mg Oral Daily   dapagliflozin  propanediol  10 mg Oral q morning   furosemide   20 mg Oral q morning   gabapentin   600 mg Oral BID WC   insulin  aspart  0-6 Units Subcutaneous TID WC   insulin  aspart  2 Units Subcutaneous TID WC   insulin  glargine  80 Units Subcutaneous QHS   magnesium  oxide  400 mg Oral Daily   multivitamin with minerals  1 tablet Oral q morning   pantoprazole   20 mg Oral Daily   sacubitril -valsartan   1 tablet Oral BID   Continuous Infusions:  amiodarone  30 mg/hr (05/06/24 2335)   PRN Meds: acetaminophen  **OR** acetaminophen , acetaminophen , melatonin, ondansetron  (ZOFRAN ) IV   Vital Signs  Vitals:   05/06/24 2336 05/07/24 0401 05/07/24 0700 05/07/24 0749  BP: 96/66 111/65 117/70   Pulse: 72 71 72 73  Resp: 18 20 16 16   Temp: 98.2 F (36.8 C) 98.3 F (36.8 C) 97.7 F (36.5 C) 97.7 F (36.5 C)  TempSrc: Oral Oral Oral Oral  SpO2: 93% 95% 95% 96%  Weight:  87.1 kg    Height:        Intake/Output Summary (Last 24 hours) at 05/07/2024 1046 Last data filed at 05/06/2024 1913 Gross per 24 hour  Intake 233 ml  Output --  Net 233 ml      05/07/2024    4:01 AM 05/06/2024    8:05 PM 05/06/2024   12:29 PM  Last 3 Weights  Weight (lbs) 192 lb 192 lb 189 lb 9.5 oz  Weight (kg) 87.091 kg 87.091 kg 86 kg      Telemetry  SR, 70's80's, occ PVCs, infrequent 3-5beat NSVTs No VT  - Personally Reviewed  ECG   No new EKGs - Personally Reviewed  Physical Exam  GEN: No acute distress.   Neck: No JVD Cardiac: RRR, no murmurs, rubs, or gallops.  Respiratory: CTA b/l. GI: Soft, nontender, non-distended  MS: No edema; No  deformity. Neuro:  Nonfocal  Psych: Normal affect   Labs High Sensitivity Troponin:   Recent Labs  Lab 05/03/24 1153 05/03/24 1347 05/05/24 1741 05/05/24 2028 05/06/24 0432  TROPONINIHS 3 2 6 10 11      Chemistry Recent Labs  Lab 05/03/24 1347 05/05/24 1741 05/06/24 0432 05/07/24 0451  NA  --  142 144 140  K  --  3.9 4.1 3.7  CL  --  108 103 108  CO2  --  25 28 24   GLUCOSE  --  114* 100* 126*  BUN  --  10 11 10   CREATININE  --  1.07* 1.11* 0.94  CALCIUM   --  9.1 9.9 9.0  MG 1.8  --  2.1 1.7  PROT  --  7.4 7.8  --   ALBUMIN  --  3.7 4.0  --   AST  --  21 21  --   ALT  --  15 16  --   ALKPHOS  --  52 58  --   BILITOT  --  1.5* 1.3*  --   GFRNONAA  --  53* 51* >60  ANIONGAP  --  9 13 8     Lipids No results for input(s): CHOL, TRIG, HDL, LABVLDL, LDLCALC, CHOLHDL in the last 168 hours.  Hematology Recent Labs  Lab 05/05/24 1741 05/06/24 0432 05/07/24 0451  WBC 5.6 5.0 4.5  RBC 5.21* 5.60* 4.97  HGB 14.3 15.4* 13.7  HCT 45.1 49.4* 42.9  MCV 86.6 88.2 86.3  MCH 27.4 27.5 27.6  MCHC 31.7 31.2 31.9  RDW 13.7 13.9 13.8  PLT 236 245 219   Thyroid   Recent Labs  Lab 05/03/24 1347  TSH 1.480    BNPNo results for input(s): BNP, PROBNP in the last 168 hours.  DDimer No results for input(s): DDIMER in the last 168 hours.   Radiology    Cardiac Studies 11/04/23: LVEF 25%-30%    Left heart catheterization 12/31/2022 Mid Cx lesion is 5% stenosed.   Ost LAD to Prox LAD lesion is 40% stenosed.   Mid LAD-2 lesion is 30% stenosed.   Ost LM lesion is 40% stenosed.   Mid LM to Dist LM lesion is 30% stenosed.   RV Branch lesion is 70% stenosed.   Prox LAD to Mid LAD lesion is 85% stenosed.   Mid LAD-1 lesion is 85% stenosed.   A drug-eluting stent was successfully placed using a SYNERGY XD 3.0X24.   Post intervention, there is a 0% residual stenosis.   Post intervention, there is a 0% residual stenosis.   LV end diastolic pressure is  moderately elevated.   2 vessel obstructive CAD with severe LAD stenosis sequentially with heavy calcification. Moderate RCA disease diffusely in a small branch Moderately elevated LVEDP 25 mm Hg Successful PCI of the mid LAD using IVUS guidance, Shockwave intracoronary lithotripsy and DES x 1.   Plan: DAPT for one year. Anticipate possible DC tomorrow.    Echocardiogram 12/06/2022  1. Left ventricular ejection fraction, by estimation, is 25 to 30%. The  left ventricle has severely decreased function. The left ventricle  demonstrates global hypokinesis. The left ventricular internal cavity size  was moderately dilated. Left  ventricular diastolic parameters are indeterminate.   2. Right ventricular systolic function is normal. The right ventricular  size is normal.   3. The mitral valve is normal in structure. Mild to moderate mitral valve  regurgitation. No evidence of mitral stenosis.   4. The aortic valve is tricuspid. There is mild calcification of the  aortic valve. Aortic valve regurgitation is mild. Aortic valve sclerosis  is present, with no evidence of aortic valve stenosis.   5. The inferior vena cava is normal in size with greater than 50%  respiratory variability, suggesting right atrial pressure of 3 mmHg.   Patient Profile   78 y.o. female w/PMHx of  HTN, HLD, Type 2 DM, prior tobacco,  stroke (Aug 2023 > ILR implanted)  CAD (PCI to LAD May 2024) ICM Chronic CHF (systolic) VT >> ICD  Admitted with syncope >> ICD interrogation noted VT > ICD therapy  Device information Abbott loop recorder, implanted 04/15/22 Cryptogenic stroke >>> BSci dual chamber ICD implanted 06/30/23 Secondary prevention   Has BOTH devices in place   Arrhythmia/AAD hx VT, started on amiodarone  May 2024/had PCI to LAD > amiodarone  not pursued chronically Recurrent VT >> Re-loaded Nov 2024  Assessment & Plan   VT PVCs Known hx for her Turns out she was inadvertently off her  amiodarone  for some months No recurrent VT here Planned for amiodarone  gtt to complete 24 hours, will transition to PO amiodarone  this evening. Recommend 400mg  BID  for 2 weeks, then 200mg  daily   EP follow up is in place   Paroxysmal AFib She had AFib develop within the episode of VT, and felt to be a consequence of her VT She does have history of stroke some years ago > had a loop with no AFib And until now, none noted via her ICD. Her presenting rhythm was AS/VS D/w Dr. Nancey,  do not think we need to initiate a/c    For questions or updates, please contact [facility] Please consult [email or web address] for contact info under    Signed, Charlies Macario Arthur, PA-C  05/07/2024, 10:46 AM

## 2024-05-07 NOTE — Plan of Care (Signed)

## 2024-05-07 NOTE — Evaluation (Signed)
 Occupational Therapy Evaluation & Discharge Patient Details Name: Paula Wong MRN: [id number] DOB: 08/08/1946 Today's Date: 05/07/2024   History of Present Illness   Pt is a 78 y.o. female admitted 9/17 for sycope episode at home. Similar incident 9/15 with ED visit. Negative for orthostatics in ED, EKG showed VT cycles. PMH: CAD, DM, diverticulosis, HLD, HTN,  ischemic cardiomyopathy, ventricular tachycardia, CKD, CHF, ICD, CVA     Clinical Impressions Pt admitted based on above, and was seen based on problem list below. PTA pt was independent with ADLs and IADLs. Today pt is at her functional baseline for ADLs and mobility. Pt able to complete standing ADLs and functional transfers independently, no AD or increased time. Pt able to mobilize in hallway 245ft with no AD, no LOB or sway. VSS on RA, pt denying any symptoms of lightheadedness or dizziness throughout. Pt reports no concerns with self-care or mobility upon d/c. No follow up therapy or DME needs. No further acute needs identified, OT is signing off on this pt.         If plan is discharge home, recommend the following:   Assist for transportation     Functional Status Assessment   Patient has not had a recent decline in their functional status     Equipment Recommendations   None recommended by OT      Precautions/Restrictions   Precautions Precautions: Fall Recall of Precautions/Restrictions: Intact Restrictions Weight Bearing Restrictions Per Provider Order: No     Mobility Bed Mobility Overal bed mobility: Independent      Transfers Overall transfer level: Independent Equipment used: None   General transfer comment: Pt mobilizing 255ft in hallway no AD, LOB or sway observed      Balance Overall balance assessment: Mild deficits observed, not formally tested       ADL either performed or assessed with clinical judgement   ADL Overall ADL's : At baseline;Independent       General  ADL Comments: pt ind, complete standing ADLs and functional transfers no assist     Vision Baseline Vision/History: 0 No visual deficits Patient Visual Report: No change from baseline Vision Assessment?: No apparent visual deficits            Pertinent Vitals/Pain Pain Assessment Pain Assessment: No/denies pain     Extremity/Trunk Assessment Upper Extremity Assessment Upper Extremity Assessment: Overall WFL for tasks assessed   Lower Extremity Assessment Lower Extremity Assessment: Overall WFL for tasks assessed   Cervical / Trunk Assessment Cervical / Trunk Assessment: Normal   Communication Communication Communication: No apparent difficulties   Cognition Arousal: Alert Behavior During Therapy: WFL for tasks assessed/performed Cognition: No apparent impairments       Following commands: Intact       Cueing  General Comments   Cueing Techniques: Verbal cues  VSS on RA. max HR during session 94 bpm           Home Living Family/patient expects to be discharged to:: Private residence Living Arrangements: Alone Available Help at Discharge: Family;Friend(s);Available PRN/intermittently Type of Home: Apartment Home Access: Level entry     Home Layout: One level     Bathroom Shower/Tub: Chief Strategy Officer: Handicapped height Bathroom Accessibility: Yes How Accessible: Accessible via walker Home Equipment: Cane - single point;Grab bars - tub/shower   Additional Comments: Pt lives in senior community      Prior Functioning/Environment Prior Level of Function : Independent/Modified Independent       Mobility Comments: No  AD, reports only falls are from syncope episodes to which she safely lowered to ground ADLs Comments: Ind, uses bus for transportation, manages own meds    OT Problem List: Cardiopulmonary status limiting activity        OT Goals(Current goals can be found in the care plan section)   Acute Rehab OT  Goals Patient Stated Goal: To go home OT Goal Formulation: All assessment and education complete, DC therapy Time For Goal Achievement: 05/21/24 Potential to Achieve Goals: Good   AM-PAC OT 6 Clicks Daily Activity     Outcome Measure Help from another person eating meals?: None Help from another person taking care of personal grooming?: None Help from another person toileting, which includes using toliet, bedpan, or urinal?: None Help from another person bathing (including washing, rinsing, drying)?: None Help from another person to put on and taking off regular upper body clothing?: None Help from another person to put on and taking off regular lower body clothing?: None 6 Click Score: 24   End of Session Equipment Utilized During Treatment: Gait belt Nurse Communication: Mobility status  Activity Tolerance: Patient tolerated treatment well Patient left: in chair;with call bell/phone within reach;with nursing/sitter in room;Other (comment) (MD present)  OT Visit Diagnosis: Unsteadiness on feet (R26.81)                Time: 9248-9188 OT Time Calculation (min): 20 min Charges:  OT General Charges $OT Visit: 1 Visit OT Evaluation $OT Eval Moderate Complexity: 1 Mod  Alexie Samson C, OT  Acute Rehabilitation Services Office [phone] Secure chat preferred   Adrianne GORMAN Savers 05/07/2024, 8:21 AM

## 2024-05-07 NOTE — Plan of Care (Signed)

## 2024-05-07 NOTE — TOC CM/SW Note (Signed)
 Transition of Care [facility]) - Inpatient Brief Assessment   Patient Details  Name: Paula Wong MRN: [id number] Date of Birth: 14-Aug-1946  Transition of [facility]) CM/SW Contact:    Sudie Erminio Deems, RN Phone Number: 05/07/2024, 2:53 PM   Clinical Narrative: Patient presented for syncope. PTA patient was independent from home alone. Patient has PCP and states she has transportation via [employer], site. Patient states she can afford medications. PT/OT has worked with patient and no recommendations were made. Per MD, patient is a possible discharge for Saturday. No further needs identified at this time by Inpatient Case Manager.    Transition of Care Asessment: Insurance and Status: Insurance coverage has been reviewed Patient has primary care physician: Yes Home environment has been reviewed: reviewed Prior level of function:: independent Prior/Current Home Services: No current home services Social Drivers of Health Review: SDOH reviewed no interventions necessary Readmission risk has been reviewed: Yes Transition of care needs: no transition of care needs at this time

## 2024-05-07 NOTE — Progress Notes (Signed)
  Progress Note   Patient: Paula Wong FMW:[id number] DOB: 1945/08/26 DOA: 05/05/2024     0 DOS: the patient was seen and examined on 05/07/2024        Brief hospital course: 78 y.o. F with CAD s/p PCI May 2024, HTN, DM, HLD, VT with ICD, hx stroke who presented with syncopal episodes. ICD interrogation revealed she had had treated VT.     Assessment and Plan: Sycnope due to symptomatic VT Discussed medication reconciliation strategies with patient. - Consult EP - Continue IV amiodarone  load - Transition to amiodarone  PO per EP - Continue Coreg  - Keep Mag>2, K>4  Hypertension Coronary artery disease Cerebrovascular disease BP normal - Continue COreg , Lasix , Entresto  - Continue aspirin , lipitor , Clopidogrel   Diabetes with polyneuropathy Glucose controlled - Continue SS corrections - Continue Glargine - Continue gabapentin  - Continue Farxiga   CKD IIIa Cr stable relative to baseline  Chronic systolic CHF Appears euvolemic - Continue Farxiga , coreg , Furosemide , Entresto   Class I obesity BMI 31.95, complicates cares        Subjective: No new symptoms, ambulating with nursing, no syncope,     Physical Exam: BP 116/72 (BP Location: Left Arm)   Pulse 67   Temp 97.7 F (36.5 C) (Oral)   Resp 17   Ht 5' 5 (1.651 m)   Wt 87.1 kg   SpO2 95%   BMI 31.95 kg/m   General: Pt is alert, awake, not in acute distress Cardiovascular: RRR, nl S1-S2, no murmurs appreciated.   No LE edema.   Respiratory: Normal respiratory rate and rhythm.  CTAB without rales or wheezes. Abdominal: Abdomen soft and non-tender.  No distension or HSM.   Neuro/Psych: Strength symmetric in upper and lower extremities.  Judgment and insight appear normal .   Data Reviewed: K 3.7, Mag 1.7, Cr at baseline CBC unremrkable  Family Communication:     Disposition: Status is: Inpatient         Author: Lonni SHAUNNA Dalton, MD 05/07/2024 4:34 PM  For on call review  [email or web address].

## 2024-05-07 NOTE — Hospital Course (Signed)
 78 y.o. F with CAD s/p PCI May 2024, HTN, DM, HLD, VT with ICD, hx stroke who presented with syncopal episodes. ICD interrogation revealed she had had treated VT.

## 2024-05-07 NOTE — Progress Notes (Signed)
 PT Cancellation Note  Patient Details Name: Paula Wong MRN: [id number] DOB: 13-May-1946   Cancelled Treatment:    Reason Eval/Treat Not Completed: PT screened, no needs identified, will sign off.  Pt Independent and per OT can be sreened out. 05/07/2024  [place] HERO., PT Acute Rehabilitation Services [phone]  (office)   [name] 05/07/2024, 1:01 PM

## 2024-05-08 ENCOUNTER — Other Ambulatory Visit (HOSPITAL_COMMUNITY): Payer: Self-pay

## 2024-05-08 DIAGNOSIS — I472 Ventricular tachycardia, unspecified: Secondary | ICD-10-CM | POA: Diagnosis not present

## 2024-05-08 DIAGNOSIS — I459 Conduction disorder, unspecified: Secondary | ICD-10-CM | POA: Diagnosis not present

## 2024-05-08 LAB — CBC
HCT: 42.3 % (ref 36.0–46.0)
Hemoglobin: 13.6 g/dL (ref 12.0–15.0)
MCH: 27.5 pg (ref 26.0–34.0)
MCHC: 32.2 g/dL (ref 30.0–36.0)
MCV: 85.5 fL (ref 80.0–100.0)
Platelets: 224 K/uL (ref 150–400)
RBC: 4.95 MIL/uL (ref 3.87–5.11)
RDW: 13.7 % (ref 11.5–15.5)
WBC: 5.3 K/uL (ref 4.0–10.5)
nRBC: 0 % (ref 0.0–0.2)

## 2024-05-08 LAB — BASIC METABOLIC PANEL WITH GFR
Anion gap: 11 (ref 5–15)
BUN: 15 mg/dL (ref 8–23)
CO2: 25 mmol/L (ref 22–32)
Calcium: 8.8 mg/dL — ABNORMAL LOW (ref 8.9–10.3)
Chloride: 105 mmol/L (ref 98–111)
Creatinine, Ser: 1.04 mg/dL — ABNORMAL HIGH (ref 0.44–1.00)
GFR, Estimated: 55 mL/min — ABNORMAL LOW (ref >60)
Glucose, Bld: 154 mg/dL — ABNORMAL HIGH (ref 70–99)
Potassium: 3.6 mmol/L (ref 3.5–5.1)
Sodium: 141 mmol/L (ref 135–145)

## 2024-05-08 LAB — MAGNESIUM: Magnesium: 1.8 mg/dL (ref 1.7–2.4)

## 2024-05-08 MED ORDER — AMIODARONE HCL 200 MG PO TABS
ORAL_TABLET | ORAL | 0 refills | Status: DC
  Filled 2024-05-08: qty 84, 28d supply, fill #0

## 2024-05-08 NOTE — Progress Notes (Signed)
 Discharge Nurse Summary: DC order noted per MD. DC RN at bedside with patient. Patient agreeable with discharge plan, states family will arrive soon for pickup.   AVS printed/reviewed. PIV removed, skin intact. No DME needs. No home meds. TOC meds delivered to the patient. CP/Edu resolved. Telemonitor returned to charging station. All belongings accounted for. Patient wheeled downstairs for discharge by private auto.   Rosario EMERSON Lund, RN

## 2024-05-08 NOTE — Progress Notes (Signed)
  Progress Note  Patient Name: Paula Wong Date of Encounter: 05/08/2024 [facility] Cardiologist: None   Interval Summary   Feeling well without complaint.  Vital Signs Vitals:   05/07/24 1713 05/07/24 1932 05/07/24 2335 05/08/24 0417  BP: (!) 118/102 120/72 118/70 114/72  Pulse:  73 76 74  Resp:  18 18 20   Temp:  98 F (36.7 C) 98.5 F (36.9 C) 98.2 F (36.8 C)  TempSrc:  Oral Oral Oral  SpO2:  96% 95% 96%  Weight:      Height:        Intake/Output Summary (Last 24 hours) at 05/08/2024 0743 Last data filed at 05/07/2024 1136 Gross per 24 hour  Intake 378.83 ml  Output --  Net 378.83 ml      05/07/2024    4:01 AM 05/06/2024    8:05 PM 05/06/2024   12:29 PM  Last 3 Weights  Weight (lbs) 192 lb 192 lb 189 lb 9.5 oz  Weight (kg) 87.091 kg 87.091 kg 86 kg      Telemetry/ECG  Sinus rhythm, short run of nonsustained VT- Personally Reviewed  Physical Exam  GEN: No acute distress.   Neck: No JVD Cardiac: RRR, no murmurs, rubs, or gallops.  Respiratory: Clear to auscultation bilaterally. GI: Soft, nontender, non-distended  MS: No edema  Assessment & Plan   1.  Ventricular tachycardia/PVCs: Had intermittently been off of amiodarone .  Has been transition to p.o. amiodarone .  She is feeling well.  She wishes to go home.  Would be okay with her being discharged later this afternoon if she has no further arrhythmias.  2.  Paroxysmal atrial fibrillation: Occurred at the time of ventricular tachycardia.  Had a loop implanted previously without evidence of atrial fibrillation.  Jasun Gasparini continue to monitor for atrial fibrillation through her defibrillator.  Matej Sappenfield hold off on anticoagulation for now.  [facility] Tage Feggins sign off.   Medication Recommendations: Amiodarone  400 mg twice daily x 2 weeks, 200 mg twice daily x 2 weeks, 200 mg daily Other recommendations (labs, testing, etc):   Follow up as an outpatient: Has follow-up 05/31/2024 in EP clinic For  questions or updates, please contact [facility] Please consult [email or web address] for contact info under         Signed, Tiannah Greenly Gladis Norton, MD

## 2024-05-08 NOTE — Plan of Care (Signed)

## 2024-05-08 NOTE — Discharge Summary (Signed)
 Physician Discharge Summary   Patient: Paula Wong MRN: [id number] DOB: Jun 21, 1946  Admit date:     05/05/2024  Discharge date: 05/08/24  Discharge Physician: Lonni SHAUNNA Dalton   PCP: Leontine Cramp, NP     Recommendations at discharge:  Follow-up with Electrophysiology Charlies Arthur on 10/13 for VT syncope      Discharge Diagnoses: Principal Problem:   Syncope due to symptomatic ventricular tachycardia Other Hospital problems:   Ventricular tachycardia   Hypertension   Coronary artery disease without angina   Cerebrovascular disease   Diabetes with polyneuropathy   Chronic kidney disease stage IIIa   Chronic systolic congestive heart failure   Class I obesity         Hospital Course: 78 y.o. F with CAD s/p PCI May 2024, HTN, DM, HLD, VT with ICD, hx stroke who presented with syncopal episodes. ICD interrogation revealed she had had treated VT.     Syncope due to ventricular tachycardia Patient was admitted and EP were consulted.  Her ICD was interrogated which revealed an episode of ventricular tachycardia, halted by 1 shock, corresponding to her syncopal episode.    It was discovered shortly after that that her amiodarone  had not been refilled since before February, and that this was with inadvertent oversight.  She was loaded with IV amiodarone , monitored for 24 hours.  She had minimal ectopy, and electrophysiology recommended transition to oral amiodarone  to complete load as below (400 twice daily for 2 weeks, followed by 200 twice daily for 2 weeks, followed by 200 mg amiodarone  indefinitely)  She has EP follow-up in 3 weeks          The [place]  Controlled Substances Registry was reviewed for this patient prior to discharge.  Consultants: Electrophysiology   Disposition: Home Diet recommendation:  Cardiac diet  DISCHARGE MEDICATION: Allergies as of 05/08/2024   No Known Allergies      Medication List     TAKE these medications     acetaminophen  500 MG tablet Commonly known as: TYLENOL  Take 1,000 mg by mouth every 6 (six) hours as needed for mild pain (pain score 1-3) or headache.   amiodarone  200 MG tablet Commonly known as: PACERONE  Take 2 tablets (400 mg total) by mouth 2 (two) times daily for 14 days, THEN 1 tablet (200 mg total) 2 (two) times daily for 14 days. Then obtain refill for 200 mg (1 tablet) once daily indefinitely. Start taking on: May 08, 2024   aspirin  EC 81 MG tablet Take 81 mg by mouth every morning. Swallow whole.   atorvastatin  80 MG tablet Commonly known as: LIPITOR  Take 80 mg by mouth at bedtime.   carvedilol  25 MG tablet Commonly known as: COREG  Take 0.5 tablets (12.5 mg total) by mouth 2 (two) times daily with a meal. What changed:  how much to take when to take this   Centrum Silver 50+Women Tabs Take 1 tablet by mouth every morning.   clopidogrel  75 MG tablet Commonly known as: PLAVIX  Take 1 tablet (75 mg total) by mouth daily. What changed: when to take this   Entresto  97-103 MG Generic drug: sacubitril -valsartan  Take 1 tablet by mouth 2 (two) times daily. What changed: when to take this   Farxiga  10 MG Tabs tablet Generic drug: dapagliflozin  propanediol Take 10 mg by mouth every morning.   furosemide  20 MG tablet Commonly known as: LASIX  Take 1 tablet (20 mg total) by mouth daily. May take an additional tablet as needed for swelling/SOB What changed:  when to take this   gabapentin  600 MG tablet Commonly known as: NEURONTIN  Take 600 mg by mouth 2 (two) times daily with a meal.   magnesium  oxide 400 MG tablet Commonly known as: MAG-OX Take 400 mg by mouth daily.   nitroGLYCERIN  0.4 MG SL tablet Commonly known as: NITROSTAT  Place 1 tablet (0.4 mg total) under the tongue every 5 (five) minutes x 3 doses as needed for chest pain.   Ozempic (1 MG/DOSE) 4 MG/3ML Sopn Generic drug: Semaglutide (1 MG/DOSE) Inject 1 mg into the skin once a week. Inject 1mg   subcutaneously once per week on Wednesday.   pantoprazole  20 MG tablet Commonly known as: PROTONIX  Take 1 tablet (20 mg total) by mouth daily.   Sodium Fluoride  5000 PPM 1.1 % Pste Generic drug: Sodium Fluoride  Take 1 Application by mouth daily.   spironolactone  25 MG tablet Commonly known as: ALDACTONE  Take 1 tablet (25 mg total) by mouth daily.   Tresiba FlexTouch 200 UNIT/ML FlexTouch Pen Generic drug: insulin  degludec Take 80 Units by mouth at bedtime.        Follow-up Information     Leverne Charlies Helling, PA-C Follow up.   Specialty: Cardiology Contact information: [address] [place] [address] [phone]         Leontine Cramp, NP Follow up.   Specialty: Nurse Practitioner Contact information: [address] [place] [address] [phone]                 Discharge Instructions     Discharge instructions   Complete by: As directed    **IMPORTANT DISCHARGE INSTRUCTIONS**   From Dr. Jonel: You were admitted for passing out Here, we interrogated your ICD and found that you had had an episode of VT!  VT (or ventricular tachycardia) is the name of the abnormal electrical activity in your heart that the ICD is made for. Thankfully, your ICD shocked your heart out of VT But in the meantime, you passed out  We also discovered that this was because amiodarone  had accidentally not gotten refilled. Expect that amiodarone  is now a lifelong medicine, unless the Electrophysiology Cardiologists or Dr. Nance stop it or another doctor stops it in dicusssion with them.  For now, take the load of amiodarone : Take 2 tablets amiodarone  (400 mg total) by mouth 2 (two) times daily for 14 days, THEN on Oct 4th taper to Take 1 tablet (200 mg total) 2 (two) times daily for 14 days, THEN on Oct 18th taper to  Take 1 tablet (200 mg total) once daily from now on  When you see Charlies Leverne at the electrophysiology office for your appointment Oct 13,  make sure she refills it  Resume all your other medicines without change   Increase activity slowly   Complete by: As directed        Discharge Exam: Filed Weights   05/06/24 1229 05/06/24 2005 05/07/24 0401  Weight: 86 kg 87.1 kg 87.1 kg    General: Pt is alert, awake, not in acute distress Cardiovascular: RRR, nl S1-S2, no murmurs appreciated.   No LE edema.   Respiratory: Normal respiratory rate and rhythm.  CTAB without rales or wheezes. Abdominal: Abdomen soft and non-tender.  No distension or HSM.   Neuro/Psych: Strength symmetric in upper and lower extremities.  Judgment and insight appear normal.   Condition at discharge: good  The results of significant diagnostics from this hospitalization (including imaging, microbiology, ancillary and laboratory) are listed below for reference.  Imaging Studies: ECHOCARDIOGRAM COMPLETE Result Date: 05/06/2024    ECHOCARDIOGRAM REPORT   Patient Name:   KAYLN GARCEAU Date of Exam: 05/06/2024 Medical Rec #:  [id number]        Height:       65.0 in Accession #:    [id number]       Weight:       189.6 lb Date of Birth:  17-Apr-1946         BSA:          1.934 m Patient Age:    78 years         BP:           115/100 mmHg Patient Gender: F                HR:           84 bpm. Exam Location:  Inpatient Procedure: 2D Echo, Cardiac Doppler, Color Doppler and Intracardiac            Opacification Agent (Both Spectral and Color Flow Doppler were            utilized during procedure). Indications:    Syncope  History:        Patient has prior history of Echocardiogram examinations, most                 recent 11/04/2023. CAD; Risk Factors:Hypertension, Diabetes and                 Dyslipidemia. Ckd stage 2.  Sonographer:    Philomena Daring Referring Phys: [id number] JUSTIN B HOWERTER IMPRESSIONS  1. Left ventricular ejection fraction, by estimation, is 20 to 25%. The left ventricle has severely decreased function. The left ventricle demonstrates global  hypokinesis. The left ventricular internal cavity size was mildly dilated. There is mild concentric left ventricular hypertrophy. Left ventricular diastolic parameters are indeterminate.  2. Right ventricular systolic function is normal. The right ventricular size is normal.  3. The mitral valve is normal in structure. Trivial mitral valve regurgitation. No evidence of mitral stenosis.  4. The aortic valve is tricuspid. There is mild calcification of the aortic valve. There is mild thickening of the aortic valve. Aortic valve regurgitation is trivial. No aortic stenosis is present.  5. The inferior vena cava is normal in size with greater than 50% respiratory variability, suggesting right atrial pressure of 3 mmHg. FINDINGS  Left Ventricle: Left ventricular ejection fraction, by estimation, is 20 to 25%. The left ventricle has severely decreased function. The left ventricle demonstrates global hypokinesis. Definity  contrast agent was given IV to delineate the left ventricular endocardial borders. The left ventricular internal cavity size was mildly dilated. There is mild concentric left ventricular hypertrophy. Left ventricular diastolic parameters are indeterminate. Indeterminate filling pressures. Right Ventricle: The right ventricular size is normal. No increase in right ventricular wall thickness. Right ventricular systolic function is normal. Left Atrium: Left atrial size was normal in size. Right Atrium: Right atrial size was normal in size. Pericardium: There is no evidence of pericardial effusion. Mitral Valve: The mitral valve is normal in structure. Trivial mitral valve regurgitation. No evidence of mitral valve stenosis. Tricuspid Valve: The tricuspid valve is normal in structure. Tricuspid valve regurgitation is not demonstrated. No evidence of tricuspid stenosis. Aortic Valve: The aortic valve is tricuspid. There is mild calcification of the aortic valve. There is mild thickening of the aortic valve.  Aortic valve regurgitation is trivial. No aortic stenosis is present. Pulmonic Valve: The  pulmonic valve was normal in structure. Pulmonic valve regurgitation is not visualized. No evidence of pulmonic stenosis. Aorta: The aortic root is normal in size and structure. Venous: The inferior vena cava is normal in size with greater than 50% respiratory variability, suggesting right atrial pressure of 3 mmHg. IAS/Shunts: No atrial level shunt detected by color flow Doppler.  LEFT VENTRICLE PLAX 2D LVIDd:         5.77 cm   Diastology LVIDs:         4.91 cm   LV e' medial:    9.79 cm/s LV PW:         1.20 cm   LV E/e' medial:  12.9 LV IVS:        1.08 cm   LV e' lateral:   9.03 cm/s LVOT diam:     2.26 cm   LV E/e' lateral: 14.0 LV SV:         50 LV SV Index:   26 LVOT Area:     4.01 cm  RIGHT VENTRICLE RV S prime:     14.00 cm/s TAPSE (M-mode): 1.9 cm LEFT ATRIUM             Index        RIGHT ATRIUM           Index LA Vol (A2C):   32.4 ml 16.76 ml/m  RA Area:     11.10 cm LA Vol (A4C):   30.2 ml 15.62 ml/m  RA Volume:   20.70 ml  10.70 ml/m LA Biplane Vol: 32.6 ml 16.86 ml/m  AORTIC VALVE LVOT Vmax:   62.90 cm/s LVOT Vmean:  42.400 cm/s LVOT VTI:    0.124 m  AORTA Ao Root diam: 2.83 cm Ao Asc diam:  3.51 cm MITRAL VALVE MV Area (PHT): 5.50 cm     SHUNTS MV Decel Time: 138 msec     Systemic VTI:  0.12 m MV E velocity: 126.00 cm/s  Systemic Diam: 2.26 cm Annabella Scarce MD Electronically signed by Annabella Scarce MD Signature Date/Time: 05/06/2024/4:24:37 PM    Final    DG Chest 2 View Result Date: 05/03/2024 CLINICAL DATA:  Mid chest pain.  Recent syncopal episode. EXAM: CHEST - 2 VIEW COMPARISON:  Chest radiograph dated 07/09/2023 FINDINGS: Lines/tubes: Left chest wall ICD leads project over the right atrium and ventricle. Left anterior chest wall loop recorder. Lungs: Well inflated lungs. No focal consolidation. Pleura: No pneumothorax or pleural effusion. Heart/mediastinum: Similar enlarged cardiomediastinal  silhouette. Bones: T12 superior endplate compression deformity, apparently new from 07/01/2023. IMPRESSION: 1. No active cardiopulmonary disease. 2. T12 superior endplate compression deformity, apparently new from 07/01/2023. Recommend correlation with point tenderness. 3. Similar cardiomegaly. Electronically Signed   By: Limin  Xu M.D.   On: 05/03/2024 12:35   CUP PACEART INCLINIC DEVICE CHECK Result Date: 04/29/2024 brief in-clinic _dual__ chamber ICD check PVC burden. Presenting Rhythm: _AS/VS__ . Auto testing was performed. Thresholds, sensing, and impedance demonstrate stable parameters and no programming changes needed. No treated arrhythmias. Estimated longevity __13 years__ . Pt enrolled in remote follow-up. c/w with PVC burden appears similar to last one brief PAT see office note for discussion RU   Microbiology: Results for orders placed or performed during the hospital encounter of 06/27/23  MRSA Next Gen by PCR, Nasal     Status: None   Collection Time: 06/27/23 10:13 PM   Specimen: Nasal Mucosa; Nasal Swab  Result Value Ref Range Status   MRSA by PCR Next Gen  NOT DETECTED NOT DETECTED Final    Comment: (NOTE) The GeneXpert MRSA Assay (FDA approved for NASAL specimens only), is one component of a comprehensive MRSA colonization surveillance program. It is not intended to diagnose MRSA infection nor to guide or monitor treatment for MRSA infections. Test performance is not FDA approved in patients less than 49 years old. Performed at [facility], [address], [place], [place] [address]   Surgical PCR screen     Status: None   Collection Time: 06/30/23 11:04 AM   Specimen: Nasal Mucosa; Nasal Swab  Result Value Ref Range Status   MRSA, PCR NEGATIVE NEGATIVE Final   Staphylococcus aureus NEGATIVE NEGATIVE Final    Comment: (NOTE) The Xpert SA Assay (FDA approved for NASAL specimens in patients 82 years of age and older), is one component of a  comprehensive surveillance program. It is not intended to diagnose infection nor to guide or monitor treatment. Performed at [facility], [address] [place], [place] [address]     Labs: CBC: Recent Labs  Lab 05/03/24 1153 05/05/24 1741 05/06/24 0432 05/07/24 0451 05/08/24 0531  WBC 6.0 5.6 5.0 4.5 5.3  NEUTROABS  --   --  2.4  --   --   HGB 14.7 14.3 15.4* 13.7 13.6  HCT 46.4* 45.1 49.4* 42.9 42.3  MCV 87.2 86.6 88.2 86.3 85.5  PLT 217 236 245 219 224   Basic Metabolic Panel: Recent Labs  Lab 05/03/24 1153 05/03/24 1347 05/05/24 1741 05/06/24 0432 05/07/24 0451 05/08/24 0531  NA 141  --  142 144 140 141  K 4.0  --  3.9 4.1 3.7 3.6  CL 107  --  108 103 108 105  CO2 23  --  25 28 24 25   GLUCOSE 90  --  114* 100* 126* 154*  BUN 11  --  10 11 10 15   CREATININE 1.04*  --  1.07* 1.11* 0.94 1.04*  CALCIUM  9.1  --  9.1 9.9 9.0 8.8*  MG  --  1.8  --  2.1 1.7 1.8   Liver Function Tests: Recent Labs  Lab 05/05/24 1741 05/06/24 0432  AST 21 21  ALT 15 16  ALKPHOS 52 58  BILITOT 1.5* 1.3*  PROT 7.4 7.8  ALBUMIN 3.7 4.0   CBG: Recent Labs  Lab 05/06/24 2145 05/07/24 0759 05/07/24 1134 05/07/24 1724 05/07/24 2059  GLUCAP 183* 179* 160* 144* 184*    Discharge time spent: approximately 25 minutes spent on discharge counseling, evaluation of patient on day of discharge, and coordination of discharge planning with nursing, social work, pharmacy and case management  Signed: Lonni SHAUNNA Dalton, MD [facility] 05/08/2024

## 2024-05-13 NOTE — Progress Notes (Signed)
Remote ICD Transmission.

## 2024-05-17 ENCOUNTER — Other Ambulatory Visit (HOSPITAL_COMMUNITY)

## 2024-05-17 ENCOUNTER — Encounter (HOSPITAL_COMMUNITY): Admitting: Cardiology

## 2024-05-30 NOTE — Progress Notes (Unsigned)
 Cardiology Office Note:  .   Date:  05/30/2024  ID:  Paula Wong, DOB [birth year], MRN [id number] PCP: Leontine Cramp, NP  [facility] Providers Cardiologist:  None Electrophysiologist:  OLE ONEIDA HOLTS, MD {  History of Present Illness: .   Paula Wong is a 78 y.o. female w/PMHx of  HTN, HLD, Type 2 DM, prior tobacco,  stroke (Aug 2023 > ILR implanted)  CAD (PCI to LAD May 2024) ICM Chronic CHF (systolic) VT >> ICD  12/31/22 device clinic got an alert for PMVT/pause episode via her loop.  In review of her chart/CAD, recommended that she come in for further evaluation. reported no obvious symptoms, was dozing on the couch the time of the event. Started on amiodarone  Cath > PCI to mid LAD LVEF reduced  D/c on amiodarone    Readmitted 01/06/23 acute HF exacerbation diuresed discharged 01/08/23  Following with cards team Referred to HF clinic as well   Admitted 06/27/23 w/VT/syncope ICD implanted Amiodrone re-loaded Discharged 07/01/23  Saw Dr. HOLTS 10/02/23 doing well, no changes, planned for Amio labs  Seeing Dr. Gardenia a couple times, last 02/03/24, enjoying cardiac rehab quite a bit, feeling well. rehab RN had reported asymptomatic, frequent PVCs Coreg  dose increased Was using lasix  PRN, was mildly volume OL > advised to take a dose of lasix  once home Planned monitor for PVC burden  Monitor noted 21.7% PVC burden, Ventricular Bigeminy and Trigeminy were present   I saw her 03/30/24 She feels well Denies any CP Infrequently feels a strange heart beat/maybe skipped or extra, unsure  which None that are persistent. No CP, SOB No near syncope or syncope High PVC burden by monitor placed the day she saw Dr. Cyndie day her coreg  was increased Has had a steady increase in her PVC burden over the last couple remotes Coreg  dose was increased June when she saw Dr. Gardenia One PVC on her EKG/rhythm strip only today and NONE while on the programmer  today Counters reset on her device Back in 1 mo to revisit PVC burden Could consider mexiletine if needed, perhaps coreg  >> Toprol   I saw her 04/29/14 PVC burden appeared largely unchanged PVCs (64K in 29 days) prior (368K in 179days) Counters cleared And 2 episodes in her monitor zone (1:25min and 1:73min both back in Aug) Coreg  further increased, again mentioned > consider change to toprol  if not better  Unfortunately admitted w/syncope 918/25 ICD interrogation noted VT > ICD therapy  Turns out she was inadvertently off her amiodarone  for some months EP saw her Planned for amiodarone  gtt to complete 24 hours, will transition to PO amiodarone  400mg  BID for 2 weeks, then 200mg  daily  She had AFib develop within the episode of VT, and felt to be a consequence of her VT She does have history of stroke some years ago > had a loop with no AFib And until now, none noted via her ICD. Her presenting rhythm was AS/VS D/w Dr. Nancey,  do not think we need to initiate a/c  Discharged 05/08/24  Today's visit is scheduled as a 35mo device visit, re-evaluate PVC burden ROS:   She is doing great! No CP, palpitations or SOB No near syncope or syncope No shocks  Still on amiodarone  200mg  BID > not yet down to daily   Device information Abbott loop recorder, implanted 04/15/22 Cryptogenic stroke >>> BSci dual chamber ICD implanted 06/30/23 Secondary prevention  Has BOTH devices in place  RV/HV lead NOT on advisory  Arrhythmia/AAD hx  VT, started on amiodarone  May 2024/had PCI to LAD > amiodarone  not pursued chronically Recurrent VT >> Re-loaded Nov 2024  Studies Reviewed: SABRA    EKG not done today  DEVICE interrogation done today and reviewed by myself Battery and lead measurements are good No recurrent VT  July 2025: monitor Patient had a min HR of 62 bpm, max HR of 110 bpm, and avg HR of 82 bpm. Predominant underlying rhythm was Sinus Rhythm. Isolated SVEs were occasional (1.3%,  5911), SVE Couplets were rare (<1.0%, 233), and SVE Triplets were rare (<1.0%, 125). Isolated VEs were frequent (21.7%, 101543), VE Couplets were occasional (1.6%, 3797), and VE Triplets were rare (<1.0%, 441). Ventricular Bigeminy and Trigeminy were present.    11/04/23: LVEF 25%-30%   Left heart catheterization 12/31/2022 Mid Cx lesion is 5% stenosed.   Ost LAD to Prox LAD lesion is 40% stenosed.   Mid LAD-2 lesion is 30% stenosed.   Ost LM lesion is 40% stenosed.   Mid LM to Dist LM lesion is 30% stenosed.   RV Branch lesion is 70% stenosed.   Prox LAD to Mid LAD lesion is 85% stenosed.   Mid LAD-1 lesion is 85% stenosed.   A drug-eluting stent was successfully placed using a SYNERGY XD 3.0X24.   Post intervention, there is a 0% residual stenosis.   Post intervention, there is a 0% residual stenosis.   LV end diastolic pressure is moderately elevated.   2 vessel obstructive CAD with severe LAD stenosis sequentially with heavy calcification. Moderate RCA disease diffusely in a small branch Moderately elevated LVEDP 25 mm Hg Successful PCI of the mid LAD using IVUS guidance, Shockwave intracoronary lithotripsy and DES x 1.   Plan: DAPT for one year. Anticipate possible DC tomorrow.   Echocardiogram 12/06/2022  1. Left ventricular ejection fraction, by estimation, is 25 to 30%. The  left ventricle has severely decreased function. The left ventricle  demonstrates global hypokinesis. The left ventricular internal cavity size  was moderately dilated. Left  ventricular diastolic parameters are indeterminate.   2. Right ventricular systolic function is normal. The right ventricular  size is normal.   3. The mitral valve is normal in structure. Mild to moderate mitral valve  regurgitation. No evidence of mitral stenosis.   4. The aortic valve is tricuspid. There is mild calcification of the  aortic valve. Aortic valve regurgitation is mild. Aortic valve sclerosis  is present, with no  evidence of aortic valve stenosis.   5. The inferior vena cava is normal in size with greater than 50%  respiratory variability, suggesting right atrial pressure of 3 mmHg.    Risk Assessment/Calculations:    Physical Exam:   VS:  There were no vitals taken for this visit.   Wt Readings from Last 3 Encounters:  05/07/24 192 lb (87.1 kg)  05/03/24 191 lb (86.6 kg)  04/29/24 193 lb (87.5 kg)    GEN: Well nourished, well developed in no acute distress NECK: No JVD; No carotid bruits CARDIAC: RRR, no extrasystoles, no murmurs, rubs, gallops RESPIRATORY: CTA b/l without rales, wheezing or rhonchi  ABDOMEN: Soft, non-tender, non-distended EXTREMITIES: No edema; No deformity   ICD site:  is stable, no thinning, fluctuation, tethering ILR site also stable  ASSESSMENT AND PLAN: .    VT PVCs Amiodarone  reloaded after she had been off inadvertantly Labs today  10/19 > should go to 200mg  daily  ICD intact function no programming changes made  Hx of ILR (cryptogenic stroke) no  AFib by ICD    CAD Ischemic CM No symptoms or exam findings of volume OL Heart logic is zero C/w Dr. Johnnye   Dispo: back in 26mo, sooner if needed  Signed, Charlies Macario Arthur, PA-C

## 2024-05-31 ENCOUNTER — Ambulatory Visit: Payer: Self-pay | Admitting: Cardiology

## 2024-05-31 ENCOUNTER — Other Ambulatory Visit (HOSPITAL_COMMUNITY): Payer: Self-pay

## 2024-05-31 ENCOUNTER — Ambulatory Visit: Attending: Physician Assistant | Admitting: Physician Assistant

## 2024-05-31 ENCOUNTER — Other Ambulatory Visit: Payer: Self-pay

## 2024-05-31 VITALS — BP 124/66 | HR 60 | Ht 65.0 in | Wt 195.0 lb

## 2024-05-31 DIAGNOSIS — I255 Ischemic cardiomyopathy: Secondary | ICD-10-CM

## 2024-05-31 DIAGNOSIS — Z95810 Presence of automatic (implantable) cardiac defibrillator: Secondary | ICD-10-CM

## 2024-05-31 DIAGNOSIS — I472 Ventricular tachycardia, unspecified: Secondary | ICD-10-CM | POA: Diagnosis not present

## 2024-05-31 DIAGNOSIS — Z79899 Other long term (current) drug therapy: Secondary | ICD-10-CM

## 2024-05-31 DIAGNOSIS — I25118 Atherosclerotic heart disease of native coronary artery with other forms of angina pectoris: Secondary | ICD-10-CM

## 2024-05-31 LAB — CUP PACEART INCLINIC DEVICE CHECK
Date Time Interrogation Session: 20251013115014
Implantable Lead Connection Status: 753985
Implantable Lead Connection Status: 753985
Implantable Lead Implant Date: 20241111
Implantable Lead Implant Date: 20241111
Implantable Lead Location: 753859
Implantable Lead Location: 753860
Implantable Lead Model: 672
Implantable Lead Model: 7841
Implantable Lead Serial Number: 1512046
Implantable Lead Serial Number: 262212
Implantable Pulse Generator Implant Date: 20241111
Pulse Gen Serial Number: 685817

## 2024-05-31 MED ORDER — AMIODARONE HCL 200 MG PO TABS: 200.0000 mg | ORAL_TABLET | Freq: Every day | ORAL | 3 refills | Status: AC

## 2024-05-31 NOTE — Patient Instructions (Signed)
 Medication Instructions:   START TAKING :   AMIODARONE  200 MG ONCE A DAY    *If you need a refill on your cardiac medications before your next appointment, please call your pharmacy*    Lab Work:  PLEASE GO DOWN STAIRS  [facility]  FIRST FLOOR   ( GET OFF ELEVATORS WALK TOWARDS WAITING AREA LAB LOCATED BY PHARMACY): LFT AND TSH      If you have labs (blood work) drawn today and your tests are completely normal, you will receive your results only by: MyChart Message (if you have MyChart) OR A paper copy in the mail If you have any lab test that is abnormal or we need to change your treatment, we will call you to review the results.    Testing/Procedures: NONE ORDERED  TODAY     Follow-Up: At [facility], you and your health needs are our priority.  As part of our continuing mission to provide you with exceptional heart care, our providers are all part of one team.  This team includes your primary Cardiologist (physician) and Advanced Practice Providers or APPs (Physician Assistants and Nurse Practitioners) who all work together to provide you with the care you need, when you need it.  Your next appointment:   3 month(s)  Provider:   Charlies Arthur, PA-C  ( CONTACT  CASSIE HALL/ ANGELINE HAMMER FOR EP SCHEDULING ISSUES )\   We recommend signing up for the patient portal called MyChart.  Sign up information is provided on this After Visit Summary.  MyChart is used to connect with patients for Virtual Visits (Telemedicine).  Patients are able to view lab/test results, encounter notes, upcoming appointments, etc.  Non-urgent messages can be sent to your provider as well.   To learn more about what you can do with MyChart, go to [email or web address].   Other Instructions

## 2024-06-01 LAB — HEPATIC FUNCTION PANEL
ALT: 18 IU/L (ref 0–32)
AST: 26 IU/L (ref 0–40)
Albumin: 4.3 g/dL (ref 3.8–4.8)
Alkaline Phosphatase: 84 IU/L (ref 49–135)
Bilirubin Total: 0.9 mg/dL (ref 0.0–1.2)
Bilirubin, Direct: 0.33 mg/dL (ref 0.00–0.40)
Total Protein: 7 g/dL (ref 6.0–8.5)

## 2024-06-01 LAB — TSH: TSH: 1.73 u[IU]/mL (ref 0.450–4.500)

## 2024-06-11 ENCOUNTER — Telehealth (HOSPITAL_COMMUNITY): Payer: Self-pay | Admitting: Cardiology

## 2024-06-11 NOTE — Telephone Encounter (Signed)
 Called to confirm/remind patient of their appointment at the [facility] on 06/11/24.   Appointment:   [x] Confirmed  [] Left mess   [] No answer/No voice mail  [] VM Full/unable to leave message  [] Phone not in service  Patient reminded to bring all medications and/or complete list.  Confirmed patient has transportation. Gave directions, instructed to utilize valet parking.

## 2024-06-14 ENCOUNTER — Ambulatory Visit (HOSPITAL_BASED_OUTPATIENT_CLINIC_OR_DEPARTMENT_OTHER)
Admission: RE | Admit: 2024-06-14 | Discharge: 2024-06-14 | Disposition: A | Discharge status: Home / Self Care | Source: Ambulatory Visit | Attending: Cardiology | Admitting: Cardiology

## 2024-06-14 ENCOUNTER — Ambulatory Visit (HOSPITAL_COMMUNITY)
Admission: RE | Admit: 2024-06-14 | Discharge: 2024-06-14 | Disposition: A | Discharge status: Home / Self Care | Source: Ambulatory Visit | Attending: Cardiology | Admitting: Cardiology

## 2024-06-14 ENCOUNTER — Encounter (HOSPITAL_COMMUNITY): Payer: Self-pay | Admitting: Cardiology

## 2024-06-14 VITALS — BP 128/80 | HR 61 | Ht 65.0 in | Wt 193.4 lb

## 2024-06-14 DIAGNOSIS — Z79899 Other long term (current) drug therapy: Secondary | ICD-10-CM | POA: Insufficient documentation

## 2024-06-14 DIAGNOSIS — I3481 Nonrheumatic mitral (valve) annulus calcification: Secondary | ICD-10-CM | POA: Insufficient documentation

## 2024-06-14 DIAGNOSIS — I2510 Atherosclerotic heart disease of native coronary artery without angina pectoris: Secondary | ICD-10-CM

## 2024-06-14 DIAGNOSIS — I472 Ventricular tachycardia, unspecified: Secondary | ICD-10-CM

## 2024-06-14 DIAGNOSIS — Z955 Presence of coronary angioplasty implant and graft: Secondary | ICD-10-CM | POA: Diagnosis not present

## 2024-06-14 DIAGNOSIS — Z95810 Presence of automatic (implantable) cardiac defibrillator: Secondary | ICD-10-CM

## 2024-06-14 DIAGNOSIS — E669 Obesity, unspecified: Secondary | ICD-10-CM | POA: Insufficient documentation

## 2024-06-14 DIAGNOSIS — I255 Ischemic cardiomyopathy: Secondary | ICD-10-CM | POA: Diagnosis not present

## 2024-06-14 DIAGNOSIS — I493 Ventricular premature depolarization: Secondary | ICD-10-CM | POA: Diagnosis not present

## 2024-06-14 DIAGNOSIS — I4729 Other ventricular tachycardia: Secondary | ICD-10-CM | POA: Insufficient documentation

## 2024-06-14 DIAGNOSIS — I5022 Chronic systolic (congestive) heart failure: Secondary | ICD-10-CM

## 2024-06-14 DIAGNOSIS — Z7902 Long term (current) use of antithrombotics/antiplatelets: Secondary | ICD-10-CM | POA: Insufficient documentation

## 2024-06-14 DIAGNOSIS — Z7982 Long term (current) use of aspirin: Secondary | ICD-10-CM | POA: Diagnosis not present

## 2024-06-14 DIAGNOSIS — I110 Hypertensive heart disease with heart failure: Secondary | ICD-10-CM | POA: Insufficient documentation

## 2024-06-14 DIAGNOSIS — Z6832 Body mass index (BMI) 32.0-32.9, adult: Secondary | ICD-10-CM | POA: Diagnosis not present

## 2024-06-14 DIAGNOSIS — E119 Type 2 diabetes mellitus without complications: Secondary | ICD-10-CM | POA: Insufficient documentation

## 2024-06-14 DIAGNOSIS — Z8673 Personal history of transient ischemic attack (TIA), and cerebral infarction without residual deficits: Secondary | ICD-10-CM | POA: Diagnosis not present

## 2024-06-14 LAB — ECHOCARDIOGRAM COMPLETE
Area-P 1/2: 6.83 cm2
S' Lateral: 5 cm

## 2024-06-14 MED ORDER — PERFLUTREN LIPID MICROSPHERE
1.0000 mL | INTRAVENOUS | Status: DC | PRN
  Administered 2024-06-14: 3 mL via INTRAVENOUS

## 2024-06-14 NOTE — Progress Notes (Signed)
 [facility] NOTE  Referring Physician: Leontine Cramp, NP  Primary Care: Leontine Cramp, NP Primary Cardiologist: Manus Passe, MD  HPI: Paula Wong is a 78 y.o. female with CAD, HFrEF, history of VT, type 2 diabetes, history of stroke, hypertension, hyperlipidemia presenting today to establish care.  She was admitted to [facility] for CVA in August 2023.  She had an echocardiogram at that time that demonstrated EF of 20 to 25%.  This was followed by left heart catheterization with 40% left main, 40% ostial LAD and 80% mid LAD disease.  Her cardiomyopathy was felt to be out of proportion to CAD and she was treated medically at that time with placement of implantable loop recorder.  In April 2024 she had a follow-up echo with persistently reduced EF and moderate mitral regurgitation.  In May 2024 she was admitted after an episode of sustained polymorphic VT that was felt to be ischemic.  She was started on IV amiodarone  with repeat left heart catheterization demonstrating persistently severe disease now status post PCI to the mid LAD the lesion.  She was readmitted in 01/06/2023 with acute hypoxic respiratory failure due to decompensated heart failure.  Since that time she has been seen in [facility] clinic where medications have been further uptitrated.  Admitted twice in September with recurrent VT. She been off amiodarone . Restarted on amiodarone . She has EP in office and is to continue amiodarone  200 mg daily.   Interval hx:  Today she returns for HF follow up.Overall feeling fine. Denies SOB/PND/Orthopnea. Denies dizziness. Denies syncope.  Appetite ok. No fever or chills. Taking all medications.   Current Outpatient Medications  Medication Sig Dispense Refill   acetaminophen  (TYLENOL ) 500 MG tablet Take 1,000 mg by mouth every 6 (six) hours as needed for mild pain (pain score 1-3) or headache.     amiodarone  (PACERONE ) 200 MG tablet Take 1 tablet (200 mg total) by mouth daily.  90 tablet 3   aspirin  EC 81 MG tablet Take 81 mg by mouth every morning. Swallow whole.     atorvastatin  (LIPITOR ) 80 MG tablet Take 80 mg by mouth at bedtime.     carvedilol  (COREG ) 25 MG tablet Take 0.5 tablets (12.5 mg total) by mouth 2 (two) times daily with a meal. 180 tablet 3   clopidogrel  (PLAVIX ) 75 MG tablet Take 1 tablet (75 mg total) by mouth daily. 30 tablet 11   FARXIGA  10 MG TABS tablet Take 10 mg by mouth every morning.     furosemide  (LASIX ) 20 MG tablet Take 1 tablet (20 mg total) by mouth daily. May take an additional tablet as needed for swelling/SOB 100 tablet 3   gabapentin  (NEURONTIN ) 600 MG tablet Take 600 mg by mouth 2 (two) times daily with a meal.     magnesium  oxide (MAG-OX) 400 MG tablet Take 400 mg by mouth daily.     Multiple Vitamins-Minerals (CENTRUM SILVER 50+WOMEN) TABS Take 1 tablet by mouth every morning.     nitroGLYCERIN  (NITROSTAT ) 0.4 MG SL tablet Place 1 tablet (0.4 mg total) under the tongue every 5 (five) minutes x 3 doses as needed for chest pain. 25 tablet 5   pantoprazole  (PROTONIX ) 20 MG tablet Take 1 tablet (20 mg total) by mouth daily. 90 tablet 3   sacubitril -valsartan  (ENTRESTO ) 97-103 MG Take 1 tablet by mouth 2 (two) times daily. 60 tablet 5   Semaglutide, 1 MG/DOSE, (OZEMPIC, 1 MG/DOSE,) 4 MG/3ML SOPN Inject 1 mg into the skin once a  week. Inject 1mg  subcutaneously once per week on Wednesday.     SODIUM FLUORIDE  5000 PPM 1.1 % PSTE Take 1 Application by mouth daily.     spironolactone  (ALDACTONE ) 25 MG tablet Take 1 tablet (25 mg total) by mouth daily. 90 tablet 3   TRESIBA FLEXTOUCH 200 UNIT/ML SOPN Take 80 Units by mouth at bedtime.      No current facility-administered medications for this encounter.   Facility-Administered Medications Ordered in Other Encounters  Medication Dose Route Frequency Provider Last Rate Last Admin   perflutren  lipid microspheres (DEFINITY ) IV suspension  1-10 mL Intravenous PRN Nguyen, Kim, NP   3 mL at  06/14/24 0838    PHYSICAL EXAM: Vitals:   06/14/24 0858  BP: 128/80  Pulse: 61  SpO2: 96%    General:   No resp difficulty Neck: no JVD.  Cor: Regular rate & rhythm.  Lungs: clear Abdomen: soft, nontender, nondistended.  Extremities: no  edema Neuro: alert & oriented x3  DATA REVIEW  ECG: 01/24/23: sinus bradycardia as per my personal interpretation 05/09/23: NSR  ECHO: 12/06/22: LVEF 25%, normal RV function as per my personal interpretation 11/04/23: LVEF 25%-30%  CATH: 12/31/22:   Mid Cx lesion is 5% stenosed.   Ost LAD to Prox LAD lesion is 40% stenosed.   Mid LAD-2 lesion is 30% stenosed.   Ost LM lesion is 40% stenosed.   Mid LM to Dist LM lesion is 30% stenosed.   RV Branch lesion is 70% stenosed.   Prox LAD to Mid LAD lesion is 85% stenosed.   Mid LAD-1 lesion is 85% stenosed.   A drug-eluting stent was successfully placed using a SYNERGY XD 3.0X24.   Post intervention, there is a 0% residual stenosis.   Post intervention, there is a 0% residual stenosis.   LV end diastolic pressure is moderately elevated.   2 vessel obstructive CAD with severe LAD stenosis sequentially with heavy calcification. Moderate RCA disease diffusely in a small branch Moderately elevated LVEDP 25 mm Hg Successful PCI of the mid LAD using IVUS guidance, Shockwave intracoronary lithotripsy and DES x 1.  03/27/22:  The left main has 40% ostial 30% distal stenosis; the LAD has 40% ostial to proximal stenosis followed by focal calcified 80% stenosis prior to the takeoff of a septal perforator and the second diagonal vessel with 30% mid stenosis; mild nonobstructive disease in the left circumflex vessel, and diffuse 70% stenosis in the marginal branch of the RCA.    [employer]- No A fib. No recentet NSVT. Activity < 1 hour per day.  Night Heart Rate 62 bpm   ASSESSMENT & PLAN:  Heart failure with reduced ejection fraction Etiology of YQ:pdryzfpr cardiomyopathy; plan for CMR to  re-evaluate LVEFwith CMR.  Echo today EF unchanged ~ 20-25%. No change from previous. Dr Zenaida reviewed Echo and I provided with results.  NYHA class / AHA Stage: NYHA II Volume status & Diuretics:  Appears euvolemic. Continue lasix  20 mg daily.  Vasodilators:Continue Entresto  97/103mg  BID Beta-Blocker: Continue coreg  12.5 mg twice a day. No room to increase today.  MRA:spironolactone  25mg  daily.  Cardiometabolic:Farxiga  10mg  Devices therapies & Valvulopathies: s/p primary prevention ICD, [employer].  Advanced therapies: Not indicated.  Limited by age will continue aggressive medical management.  2. Hypertension  Stable.  Continue current regiemn.   3. PVC/NSVT - Had NSVT 2 episodes in September - Continue amiodarone  200mg  daily + coreg  12.5 mg twice a day.   4. CAD -Multivessel CAD with PCI  to the LAD as noted above.  Continue Plavix  for 1 year -Lipid for 80 mg daily with aspirin  81 mg -No chest pain.   5. VT  04/2024 x 2 .  -Seen by EP.  Suspected d/t CAD.  Continue amio 200mg  daiy . Recent LFTs/TSH stable.    6. 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 Seen by Neurology -Loop recorder was placed 08/23.  No AF documented -Has been on aspirin , plavix  and statin  7. Obesity - Body mass index is 32.18 kg/m. - Continue GLPi1  Follow up in 3 months with Dr Zenaida.   Christan Ciccarelli NP-C  9:10 AM

## 2024-06-14 NOTE — Patient Instructions (Signed)
 Great to see you today!!!  Medication Changes:  None, continue current medications   Special Instructions // Education:  Do the following things EVERYDAY: Weigh yourself in the morning before breakfast. Write it down and keep it in a log. Take your medicines as prescribed Eat low salt foods--Limit salt (sodium) to 2000 mg per day.  Stay as active as you can everyday Limit all fluids for the day to less than 2 liters   Follow-Up in: 3 months (January 2026), **PLEASE CALL OUR OFFICE IN DECEMBER TO SCHEDULE THIS APPOINTMENT   At the [facility], you and your health needs are our priority. We have a designated team specialized in the treatment of Heart Failure. This Care Team includes your primary Heart Failure Specialized Cardiologist (physician), Advanced Practice Providers (APPs- Physician Assistants and Nurse Practitioners), and Pharmacist who all work together to provide you with the care you need, when you need it.   You may see any of the following providers on your designated Care Team at your next follow up:  Dr. Toribio Fuel Dr. Ezra Shuck Dr. Ria Commander Dr. Odis Brownie Greig Mosses, NP Caffie Shed, [place] [facility] [place], [place] Beckey Coe, NP Jordan Lee, NP Tinnie Redman, PharmD   Please be sure to bring in all your medications bottles to every appointment.   Need to Contact Us :  If you have any questions or concerns before your next appointment please send us  a message through [place] or call our office at [phone].    TO LEAVE A MESSAGE FOR THE NURSE SELECT OPTION 2, PLEASE LEAVE A MESSAGE INCLUDING: YOUR NAME DATE OF BIRTH CALL BACK NUMBER REASON FOR CALL**this is important as we prioritize the call backs  YOU WILL RECEIVE A CALL BACK THE SAME DAY AS LONG AS YOU CALL BEFORE 4:00 PM

## 2024-06-29 ENCOUNTER — Ambulatory Visit: Payer: 59

## 2024-06-29 DIAGNOSIS — I255 Ischemic cardiomyopathy: Secondary | ICD-10-CM

## 2024-06-30 ENCOUNTER — Ambulatory Visit: Payer: Self-pay | Admitting: Cardiology

## 2024-06-30 LAB — CUP PACEART REMOTE DEVICE CHECK
Battery Remaining Longevity: 156 mo
Battery Remaining Percentage: 100 %
Brady Statistic RA Percent Paced: 69 %
Brady Statistic RV Percent Paced: 1 %
Date Time Interrogation Session: 20251111001100
HighPow Impedance: 81 Ohm
Implantable Lead Connection Status: 753985
Implantable Lead Connection Status: 753985
Implantable Lead Implant Date: 20241111
Implantable Lead Implant Date: 20241111
Implantable Lead Location: 753859
Implantable Lead Location: 753860
Implantable Lead Model: 672
Implantable Lead Model: 7841
Implantable Lead Serial Number: 1512046
Implantable Lead Serial Number: 262212
Implantable Pulse Generator Implant Date: 20241111
Lead Channel Impedance Value: 525 Ohm
Lead Channel Impedance Value: 670 Ohm
Lead Channel Pacing Threshold Amplitude: 0.6 V
Lead Channel Pacing Threshold Pulse Width: 0.4 ms
Lead Channel Setting Pacing Amplitude: 2 V
Lead Channel Setting Pacing Amplitude: 2.5 V
Lead Channel Setting Pacing Pulse Width: 0.4 ms
Lead Channel Setting Sensing Sensitivity: 0.6 mV
Pulse Gen Serial Number: 685817
Zone Setting Status: 755011

## 2024-07-02 NOTE — Progress Notes (Signed)
 Remote ICD Transmission

## 2024-07-08 ENCOUNTER — Encounter: Payer: Self-pay | Admitting: Cardiology

## 2024-07-28 ENCOUNTER — Encounter: Payer: Self-pay | Admitting: Podiatry

## 2024-07-28 ENCOUNTER — Ambulatory Visit: Admitting: Podiatry

## 2024-07-28 DIAGNOSIS — E1169 Type 2 diabetes mellitus with other specified complication: Secondary | ICD-10-CM

## 2024-07-28 DIAGNOSIS — B351 Tinea unguium: Secondary | ICD-10-CM | POA: Diagnosis not present

## 2024-07-28 DIAGNOSIS — M79609 Pain in unspecified limb: Secondary | ICD-10-CM

## 2024-07-28 NOTE — Progress Notes (Signed)
 Subjective:  Patient ID: Paula Wong, female    DOB: [birth year],   MRN: [id number]  Chief Complaint  Patient presents with   Diabetes    Trim Saw Luke Miyamoto, NP - 07/26/2024; A1c - 6.0 05/06/24    78 y.o. female presents concern of thickened elongated and painful nails that are difficult to trim. Requesting to have them trimmed today. Relates burning and tingling in their feet. Patient is diabetic and last A1c was  Lab Results  Component Value Date   HGBA1C 6.0 (H) 05/06/2024   .  Does relates recent fracture of her foot and ankle after tripping over an extension cord. Relates she just got out of the boot and doing well.   PCP:  Miyamoto Luke, NP     PCP:  Miyamoto Luke, NP    . Denies any other pedal complaints. Denies n/v/f/c.   Past Medical History:  Diagnosis Date   Allergy    CAD (coronary artery disease)    a. 03/2022 Cath: LM 40ost, 59m, LAD 40ost/p, 70m, 34m, LCX 87m, RCA nl, RV branch 70-->Med rx.   Chronic HFrEF (heart failure with reduced ejection fraction) (HCC)    a. 11/2021 Echo: EF 20-25%; b. 03/2022 Echo: EF 20-25%; c. 11/2022 Echo: EF 25-30%, glob HK.   Diabetes (HCC)    Diverticulosis    GERD (gastroesophageal reflux disease)    Hiatal hernia    Hyperlipidemia    Hypertension    Implantable loop recorder present    a. 03/2022 s/p Abbott IQ EL+ (serial [id number]) ILR in setting of cryptogenic stroke.   Ischemic cardiomyopathy    a. 11/2021 Echo: EF 20-25%; b. 03/2022 Echo: EF 20-25%; c. 11/2022 Echo: EF 25-30%, glob HK, nl RV fxn, mild-mod MR, mild AI, AoV sclerosis.   Neuropathy    feet   Schatzki's ring    Stroke [facility])    a. 03/2022 MRI Brain: subacute cortical infarct involving inf and med L occipial pole w/ petechial hemorrhage; b. 03/2022 s/p Abbott Assert IQ EL ILR.   Ventricular tachycardia (HCC)    a. 12/2022 noted on ILR.    Objective:  Physical Exam: Vascular: DP/PT pulses 2/4 bilateral. CFT <3 seconds. Absent hair growth on digits. Edema noted to  bilateral lower extremities. Xerosis noted bilaterally.  Skin. No lacerations or abrasions bilateral feet. Nails 1-5 bilateral  are thickened discolored and elongated with subungual debris.  Musculoskeletal: MMT 5/5 bilateral lower extremities in DF, PF, Inversion and Eversion. Deceased ROM in DF of ankle joint. Mild tenderness and edema noted along lateral malleolus and fibula.  Neurological: Sensation intact to light touch. Protective sensation intact bilateral.    Assessment:   1. Pain due to onychomycosis of nail   2. Type 2 diabetes mellitus with other specified complication, without long-term current use of insulin  (HCC)       Plan:  Patient was evaluated and treated and all questions answered. -Discussed and educated patient on diabetic foot care, especially with  regards to the vascular, neurological and musculoskeletal systems.  -Stressed the importance of good glycemic control and the detriment of not  controlling glucose levels in relation to the foot. -Discussed supportive shoes at all times and checking feet regularly.  -Mechanically debrided all nails 1-5 bilateral using sterile nail nipper and filed with dremel without incident  -Answered all patient questions -Patient to return  in 3 months for at risk foot care -Patient advised to call the office if any problems or questions arise in the meantime.  Asberry Failing, DPM

## 2024-09-15 NOTE — Progress Notes (Unsigned)
 " Cardiology Office Note:  .   Date:  09/15/2024  ID:  Paula Wong, DOB [birth year], MRN [id number] PCP: Paula Cramp, NP  [facility] Providers Cardiologist:  None Electrophysiologist:  Paula ONEIDA HOLTS, MD (Inactive) {  History of Present Illness: .   Paula Wong is a 79 y.o. female w/PMHx of  HTN, HLD, Type 2 DM, prior tobacco,  stroke (Aug 2023 > ILR implanted)  CAD (PCI to LAD May 2024) ICM Chronic CHF (systolic) VT >> ICD  12/31/22 device clinic got an alert for PMVT/pause episode via her loop.  In review of her chart/CAD, recommended that she come in for further evaluation. reported no obvious symptoms, was dozing on the couch the time of the event. Started on amiodarone  Cath > PCI to mid LAD LVEF reduced  D/c on amiodarone    Readmitted 01/06/23 acute HF exacerbation diuresed discharged 01/08/23  Following with cards team Referred to HF clinic as well   Admitted 06/27/23 w/VT/syncope ICD implanted Amiodrone re-loaded Discharged 07/01/23  Saw Paula Wong 10/02/23 doing well, no changes, planned for Amio labs  Seeing Paula Wong a couple times, last 02/03/24, enjoying cardiac rehab quite a bit, feeling well. rehab RN had reported asymptomatic, frequent PVCs Coreg  dose increased Was using lasix  PRN, was mildly volume OL > advised to take a dose of lasix  once home Planned monitor for PVC burden  Monitor noted 21.7% PVC burden, Ventricular Bigeminy and Trigeminy were present   I saw her 03/30/24 She feels well Denies any CP Infrequently feels a strange heart beat/maybe skipped or extra, unsure  which None that are persistent. No CP, SOB No near syncope or syncope High PVC burden by monitor placed the day she saw Dr. Cyndie day her coreg  was increased Has had a steady increase in her PVC burden over the last couple remotes Coreg  dose was increased June when she saw Paula Wong One PVC on her EKG/rhythm strip only today and NONE while on the  programmer today Counters reset on her device Back in 1 mo to revisit PVC burden Could consider mexiletine if needed, perhaps coreg  >> Toprol   I saw her 04/29/24 PVC burden appeared largely unchanged PVCs (64K in 29 days) prior (368K in 179days) Counters cleared And 2 episodes in her monitor zone (1:3min and 1:75min both back in Aug) Coreg  further increased, again mentioned > consider change to toprol  if not better  Unfortunately admitted w/syncope 918/25 ICD interrogation noted VT > ICD therapy  Turns out she was inadvertently off her amiodarone  for some months EP saw her Planned for amiodarone  gtt to complete 24 hours, will transition to PO amiodarone  400mg  BID for 2 weeks, then 200mg  daily  She had AFib develop within the episode of VT, and felt to be a consequence of her VT She does have history of stroke some years ago > had a loop with no AFib And until now, none noted via her ICD. Her presenting rhythm was AS/VS D/w Dr. Nancey,  do not think we need to initiate a/c  Discharged 05/08/24  I saw her Oct 2025 She is doing great! No CP, palpitations or SOB No near syncope or syncope No shocks Still on amiodarone  200mg  BID > not yet down to daily Device check with no VT or AFib Planned to transition to every day amiodarone  and f/u in 3 mo to monitor for PVCs/VT burden  She saw Paula Wong 06/14/24, again doing/feeling well No changes were made.    Today's visit is scheduled  as a 3 mo f/u  ROS:   *** amio labs *** PVC burden, VT, AFib? (Cryptogenic stroke) *** volume *** scheduled to see Paula Wong in Feb     Device information Abbott loop recorder, implanted 04/15/22 Cryptogenic stroke >>> [employer] dual chamber ICD implanted 06/30/23 Secondary prevention  Has BOTH devices in place  RV/HV lead NOT on advisory  Arrhythmia/AAD hx VT, started on amiodarone  May 2024/had PCI to LAD > amiodarone  not pursued chronically Recurrent VT >> Re-loaded Nov 2024  Studies  Reviewed: SABRA    EKG not done today  DEVICE interrogation done today and reviewed by myself **** Battery and *** auto lead measurements are good *** No recurrent VT *** no AFib  July 2025: monitor Patient had a min HR of 62 bpm, max HR of 110 bpm, and avg HR of 82 bpm. Predominant underlying rhythm was Sinus Rhythm. Isolated SVEs were occasional (1.3%, 5911), SVE Couplets were rare (<1.0%, 233), and SVE Triplets were rare (<1.0%, 125). Isolated VEs were frequent (21.7%, 101543), VE Couplets were occasional (1.6%, 3797), and VE Triplets were rare (<1.0%, 441). Ventricular Bigeminy and Trigeminy were present.    11/04/23: LVEF 25%-30%   Left heart catheterization 12/31/2022 Mid Cx lesion is 5% stenosed.   Ost LAD to Prox LAD lesion is 40% stenosed.   Mid LAD-2 lesion is 30% stenosed.   Ost LM lesion is 40% stenosed.   Mid LM to Dist LM lesion is 30% stenosed.   RV Branch lesion is 70% stenosed.   Prox LAD to Mid LAD lesion is 85% stenosed.   Mid LAD-1 lesion is 85% stenosed.   A drug-eluting stent was successfully placed using a SYNERGY XD 3.0X24.   Post intervention, there is a 0% residual stenosis.   Post intervention, there is a 0% residual stenosis.   LV end diastolic pressure is moderately elevated.   2 vessel obstructive CAD with severe LAD stenosis sequentially with heavy calcification. Moderate RCA disease diffusely in a small branch Moderately elevated LVEDP 25 mm Hg Successful PCI of the mid LAD using IVUS guidance, Shockwave intracoronary lithotripsy and DES x 1.   Plan: DAPT for one year. Anticipate possible DC tomorrow.   Echocardiogram 12/06/2022  1. Left ventricular ejection fraction, by estimation, is 25 to 30%. The  left ventricle has severely decreased function. The left ventricle  demonstrates global hypokinesis. The left ventricular internal cavity size  was moderately dilated. Left  ventricular diastolic parameters are indeterminate.   2. Right ventricular  systolic function is normal. The right ventricular  size is normal.   3. The mitral valve is normal in structure. Mild to moderate mitral valve  regurgitation. No evidence of mitral stenosis.   4. The aortic valve is tricuspid. There is mild calcification of the  aortic valve. Aortic valve regurgitation is mild. Aortic valve sclerosis  is present, with no evidence of aortic valve stenosis.   5. The inferior vena cava is normal in size with greater than 50%  respiratory variability, suggesting right atrial pressure of 3 mmHg.    Risk Assessment/Calculations:    Physical Exam:   VS:  There were no vitals taken for this visit.   Wt Readings from Last 3 Encounters:  06/14/24 193 lb 6.4 oz (87.7 kg)  05/31/24 195 lb (88.5 kg)  05/07/24 192 lb (87.1 kg)    GEN: Well nourished, well developed in no acute distress NECK: No JVD; No carotid bruits CARDIAC: *** RRR, no extrasystoles, no murmurs, rubs, gallops  RESPIRATORY: *** CTA b/l without rales, wheezing or rhonchi  ABDOMEN: Soft, non-tender, non-distended EXTREMITIES: *** No edema; No deformity   *** ICD site:  is stable, no thinning, fluctuation, tethering *** ILR site also stable  ASSESSMENT AND PLAN: .    VT PVCs Amiodarone  reloaded after she had been off inadvertantly ***  ICD *** intact function *** no programming changes made  Hx of ILR (cryptogenic stroke) *** no AFib by ICD    CAD Ischemic CM No symptoms or exam findings of volume OL Heart logic is ***  C/w Dr. Johnnye   Dispo: back in 42mo, sooner if needed  Signed, Trashaun Streight Macario Arthur, PA-C   "

## 2024-09-16 ENCOUNTER — Encounter: Payer: Self-pay | Admitting: Physician Assistant

## 2024-09-16 ENCOUNTER — Ambulatory Visit: Admitting: Cardiology

## 2024-09-16 VITALS — BP 118/60 | HR 60 | Ht 65.0 in | Wt 193.0 lb

## 2024-09-16 DIAGNOSIS — I10 Essential (primary) hypertension: Secondary | ICD-10-CM | POA: Diagnosis not present

## 2024-09-16 DIAGNOSIS — I5022 Chronic systolic (congestive) heart failure: Secondary | ICD-10-CM | POA: Diagnosis not present

## 2024-09-16 DIAGNOSIS — Z95810 Presence of automatic (implantable) cardiac defibrillator: Secondary | ICD-10-CM

## 2024-09-16 DIAGNOSIS — I2510 Atherosclerotic heart disease of native coronary artery without angina pectoris: Secondary | ICD-10-CM | POA: Diagnosis not present

## 2024-09-16 DIAGNOSIS — I472 Ventricular tachycardia, unspecified: Secondary | ICD-10-CM

## 2024-09-16 DIAGNOSIS — E785 Hyperlipidemia, unspecified: Secondary | ICD-10-CM | POA: Diagnosis not present

## 2024-09-16 DIAGNOSIS — I639 Cerebral infarction, unspecified: Secondary | ICD-10-CM | POA: Diagnosis not present

## 2024-09-16 LAB — CUP PACEART INCLINIC DEVICE CHECK
Date Time Interrogation Session: 20260129115909
HighPow Impedance: 77 Ohm
Implantable Lead Connection Status: 753985
Implantable Lead Connection Status: 753985
Implantable Lead Implant Date: 20241111
Implantable Lead Implant Date: 20241111
Implantable Lead Location: 753859
Implantable Lead Location: 753860
Implantable Lead Model: 672
Implantable Lead Model: 7841
Implantable Lead Serial Number: 1512046
Implantable Lead Serial Number: 262212
Implantable Pulse Generator Implant Date: 20241111
Lead Channel Setting Pacing Amplitude: 2 V
Lead Channel Setting Pacing Amplitude: 2.5 V
Lead Channel Setting Pacing Pulse Width: 0.4 ms
Lead Channel Setting Sensing Sensitivity: 0.6 mV
Pulse Gen Serial Number: 685817
Zone Setting Status: 755011

## 2024-09-16 NOTE — Patient Instructions (Signed)
 Medication Instructions:    Your physician recommends that you continue on your current medications as directed. Please refer to the Current Medication list given to you today.   *If you need a refill on your cardiac medications before your next appointment, please call your pharmacy*   Lab Work:   PLEASE GO DOWN STAIRS  [facility]  FIRST FLOOR   ( GET OFF ELEVATORS WALK TOWARDS WAITING AREA LAB LOCATED BY PHARMACY):  CMET AND  TSH TODAY      If you have labs (blood work) drawn today and your tests are completely normal, you will receive your results only by: MyChart Message (if you have MyChart) OR A paper copy in the mail If you have any lab test that is abnormal or we need to change your treatment, we will call you to review the results.   Testing/Procedures:  NONE ORDERED  TODAY     Follow-Up: At [facility], you and your health needs are our priority.  As part of our continuing mission to provide you with exceptional heart care, our providers are all part of one team.  This team includes your primary Cardiologist (physician) and Advanced Practice Providers or APPs (Physician Assistants and Nurse Practitioners) who all work together to provide you with the care you need, when you need it.  Your next appointment:   6 month(s)  Provider:   Artist Pouch, PA-C    We recommend signing up for the patient portal called MyChart.  Sign up information is provided on this After Visit Summary.  MyChart is used to connect with patients for Virtual Visits (Telemedicine).  Patients are able to view lab/test results, encounter notes, upcoming appointments, etc.  Non-urgent messages can be sent to your provider as well.   To learn more about what you can do with MyChart, go to [email or web address].   Other Instructions

## 2024-09-16 NOTE — Progress Notes (Signed)
" °  Electrophysiology Office Note:   ID:  Paula Wong, DOB 01-23-1946, MRN [id number]  Primary Cardiologist: None Electrophysiologist: Fonda Kitty, MD      History of Present Illness:   Paula Wong is a 79 y.o. female with h/o hypertension, hyperlipidemia, DM type II, CVA, CAD, chronic CHF with ICM, VT s/p ICD seen today for routine electrophysiology followup to assess PVC/VT burden.    Patient admitted 06/27/23 with VT/syncope and had ICD implanted. Patient generally did well following implant but was unfortunately admitted 05/06/24 with syncope, found to have had VT with ICD therapy. It was discovered that patient had been off Amiodarone  inadvertently for several months prior to this recurrent VT. Per notes, device report indicated that patient had onset of afib within episode of VT, felt to be a consequence of the VT.   Since her hospitalization last year, the patient reports doing very well.  she denies chest pain, palpitations, dyspnea, PND, orthopnea, nausea, vomiting, dizziness, syncope, edema, weight gain, or early satiety.   She confirms that she has continued to take all medications as prescribed including once daily amiodarone  200 mg.  Review of systems complete and found to be negative unless listed in HPI.   EP Information / Studies Reviewed:    EKG is not ordered today.        ICD Interrogation-  Limited evaluation today to assess PVC burden and look for recurrent VT,  See [facility] report.  Arrhythmia/Device History [employer] Practitioner DR [phone] implanted 06/30/23.  Physical Exam:   VS:  BP 118/60   Pulse 60   Ht 5' 5 (1.651 m)   Wt 193 lb (87.5 kg)   SpO2 96%   BMI 32.12 kg/m    Wt Readings from Last 3 Encounters:  09/16/24 193 lb (87.5 kg)  06/14/24 193 lb 6.4 oz (87.7 kg)  05/31/24 195 lb (88.5 kg)     GEN: No acute distress  NECK: No JVD CARDIAC: Regular rate and rhythm, no murmurs, rubs, gallops RESPIRATORY:  Clear to auscultation without  rales, wheezing or rhonchi  ABDOMEN: Soft, non-tender, non-distended EXTREMITIES:  No edema; No deformity   ASSESSMENT AND PLAN:    Ventricular arrhythmia  s/p [employer] dual chamber ICD  PVCs Normal ICD function per auto testing. See Elisabeth Art report On interrogation, 0 episodes of VT since October admission.  No other arrhythmias noted.  PVCs are seen, overall burden stable.  Given no symptoms with PVCs, no changes today.  Continue amiodarone  200 mg daily.  Will update liver function test and thyroid  function today.  CAD No anginal symptoms per patient. Continues on Plavix  and statin.   Hx CVA No afib on device interrogation.   HFrEF Euvolemic appearing today and by device report Stable on an appropriate medical regimen.  Hypertension Blood pressure is well-controlled on current antihypertensive regimen. Continue current medications and dosing.    Disposition:   Follow up with EP Team in 6 months   Signed, Artist Pouch, PA-C  "

## 2024-09-17 ENCOUNTER — Ambulatory Visit: Payer: Self-pay | Admitting: Cardiology

## 2024-09-17 LAB — COMPREHENSIVE METABOLIC PANEL WITH GFR
ALT: 23 [IU]/L (ref 0–32)
AST: 20 [IU]/L (ref 0–40)
Albumin: 4.1 g/dL (ref 3.8–4.8)
Alkaline Phosphatase: 69 [IU]/L (ref 49–135)
BUN/Creatinine Ratio: 10 — ABNORMAL LOW (ref 12–28)
BUN: 12 mg/dL (ref 8–27)
Bilirubin Total: 0.9 mg/dL (ref 0.0–1.2)
CO2: 22 mmol/L (ref 20–29)
Calcium: 9.1 mg/dL (ref 8.7–10.3)
Chloride: 105 mmol/L (ref 96–106)
Creatinine, Ser: 1.23 mg/dL — ABNORMAL HIGH (ref 0.57–1.00)
Globulin, Total: 2.7 g/dL (ref 1.5–4.5)
Glucose: 141 mg/dL — ABNORMAL HIGH (ref 70–99)
Potassium: 4.7 mmol/L (ref 3.5–5.2)
Sodium: 144 mmol/L (ref 134–144)
Total Protein: 6.8 g/dL (ref 6.0–8.5)
eGFR: 45 mL/min/{1.73_m2} — ABNORMAL LOW

## 2024-09-17 LAB — TSH: TSH: 1.2 u[IU]/mL (ref 0.450–4.500)

## 2024-09-20 ENCOUNTER — Ambulatory Visit: Admitting: Internal Medicine

## 2024-10-07 ENCOUNTER — Ambulatory Visit: Admitting: Internal Medicine

## 2024-10-27 ENCOUNTER — Ambulatory Visit: Admitting: Podiatry
